# Patient Record
Sex: Female | Born: 1980
Health system: Southern US, Community
[De-identification: ages and names within clinical notes are randomized; demographics above are authoritative.]

## PROBLEM LIST (undated history)

## (undated) DIAGNOSIS — Z974 Presence of external hearing-aid: Secondary | ICD-10-CM

## (undated) DIAGNOSIS — E079 Disorder of thyroid, unspecified: Secondary | ICD-10-CM

## (undated) DIAGNOSIS — Z9079 Acquired absence of other genital organ(s): Secondary | ICD-10-CM

## (undated) DIAGNOSIS — J342 Deviated nasal septum: Secondary | ICD-10-CM

## (undated) DIAGNOSIS — F419 Anxiety disorder, unspecified: Secondary | ICD-10-CM

## (undated) DIAGNOSIS — N200 Calculus of kidney: Secondary | ICD-10-CM

## (undated) DIAGNOSIS — I639 Cerebral infarction, unspecified: Secondary | ICD-10-CM

## (undated) DIAGNOSIS — E785 Hyperlipidemia, unspecified: Secondary | ICD-10-CM

## (undated) DIAGNOSIS — K219 Gastro-esophageal reflux disease without esophagitis: Secondary | ICD-10-CM

## (undated) DIAGNOSIS — G4730 Sleep apnea, unspecified: Secondary | ICD-10-CM

## (undated) DIAGNOSIS — I10 Essential (primary) hypertension: Secondary | ICD-10-CM

## (undated) HISTORY — PX: TONSILLECTOMY: SUR1361

## (undated) HISTORY — DX: Disorder of thyroid, unspecified: E07.9

## (undated) HISTORY — DX: Cerebral infarction, unspecified: I63.9

## (undated) HISTORY — DX: Anxiety disorder, unspecified: F41.9

## (undated) HISTORY — PX: TYMPANOSTOMY: SHX2586

## (undated) HISTORY — DX: Gastro-esophageal reflux disease without esophagitis: K21.9

## (undated) HISTORY — PX: NASAL SEPTUM SURGERY: SHX37

## (undated) HISTORY — DX: Presence of external hearing-aid: Z97.4

## (undated) HISTORY — DX: Hyperlipidemia, unspecified: E78.5

## (undated) HISTORY — DX: Sleep apnea, unspecified: G47.30

---
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
## 2011-12-01 DIAGNOSIS — I10 Essential (primary) hypertension: Secondary | ICD-10-CM

## 2013-10-24 HISTORY — PX: ECTOPIC PREGNANCY SURGERY: SHX613

## 2013-11-22 ENCOUNTER — Emergency Department (HOSPITAL_COMMUNITY)
Admission: EM | Admit: 2013-11-22 | Discharge: 2013-11-23 | Disposition: A | Discharge status: Home / Self Care | Payer: Medicaid Other | Attending: Emergency Medicine | Admitting: Emergency Medicine

## 2013-11-22 ENCOUNTER — Encounter (HOSPITAL_COMMUNITY): Payer: Self-pay | Admitting: Emergency Medicine

## 2013-11-22 DIAGNOSIS — H53149 Visual discomfort, unspecified: Secondary | ICD-10-CM | POA: Insufficient documentation

## 2013-11-22 DIAGNOSIS — R51 Headache: Secondary | ICD-10-CM | POA: Insufficient documentation

## 2013-11-22 DIAGNOSIS — Z3202 Encounter for pregnancy test, result negative: Secondary | ICD-10-CM | POA: Insufficient documentation

## 2013-11-22 DIAGNOSIS — R519 Headache, unspecified: Secondary | ICD-10-CM

## 2013-11-22 DIAGNOSIS — I10 Essential (primary) hypertension: Secondary | ICD-10-CM | POA: Insufficient documentation

## 2013-11-22 DIAGNOSIS — F17200 Nicotine dependence, unspecified, uncomplicated: Secondary | ICD-10-CM | POA: Insufficient documentation

## 2013-11-22 HISTORY — DX: Essential (primary) hypertension: I10

## 2013-11-22 NOTE — ED Notes (Signed)
Pt here with severe HA which began yesterday and is in frontal and right temporal area.  Pt reports sensitivity to light and sound.  Pt is alert and oriented.  No fever, neck supple.  Pt has taken tylenol and advil without relief.  Pt has not taken BP meds today.  Hypertensive on arrival.

## 2013-11-22 NOTE — ED Provider Notes (Signed)
CSN: [id number]     Arrival date & time 11/22/13  2308 History  This chart was scribed for Maria Clonts, MD by Elby Beck, ED Scribe. This patient was seen in room APA10/APA10 and the patient's care was started at 11:35 PM.    Chief Complaint  Patient presents with  . Migraine     The history is provided by the patient. No language interpreter was used.    HPI Comments: Maria Lang is a 33 y.o. Female with a history of HTN who presents to the Emergency Department complaining of a gradual onset, gradually worsening constant, moderate headache onset yesterday. She locates this headache to her right temple and frontal head. She denies any head injury. She reports associated photophobia. She states that she has tried Tylenol and Advil without relief. She states that she has had similar, but less severe headaches in the past. She denies any history of aneurysm or brain disease. She denies any associated fever, neck stiffness, emesis, neck pain, chest pain, SOB, abdominal pain, diarrhea, weakness, numbness, bowel or bladder incontinence. She states that she has no medication allergies.    Past Medical History  Diagnosis Date  . Hypertension    Past Surgical History  Procedure Laterality Date  . Ectopic pregnancy surgery  10/2013   No family history on file. History  Substance Use Topics  . Smoking status: Current Every Day Smoker -- 0.50 packs/day    Types: Cigarettes  . Smokeless tobacco: Not on file  . Alcohol Use: No   OB History   Grav Para Term Preterm Abortions TAB SAB Ect Mult Living                 Review of Systems  Constitutional: Negative for fever.  Eyes: Positive for photophobia.  Respiratory: Negative for shortness of breath.   Cardiovascular: Negative for chest pain.  Gastrointestinal: Negative for vomiting, abdominal pain and diarrhea.       Denies bowel incontinence  Genitourinary:       Denies bladder incontinence  Musculoskeletal: Negative for neck  pain and neck stiffness.  Neurological: Positive for headaches. Negative for weakness and numbness.  All other systems reviewed and are negative.   Allergies  Review of patient's allergies indicates no known allergies.  Home Medications   Prior to Admission medications   Not on File   Triage Vitals: BP 192/115  Pulse 70  Temp(Src) 98.1 F (36.7 C) (Oral)  Resp 20  SpO2 100%  LMP 10/08/2013  Physical Exam  Nursing note and vitals reviewed. Constitutional: She is oriented to person, place, and time. She appears well-developed and well-nourished. No distress.  Well-appearing  HENT:  Head: Normocephalic and atraumatic.  Eyes: EOM are normal. Pupils are equal, round, and reactive to light.  Neck: Neck supple. No tracheal deviation present.  No meningismus  Cardiovascular: Normal rate.   Pulmonary/Chest: Effort normal. No respiratory distress.  Musculoskeletal: Normal range of motion.  Neurological: She is alert and oriented to person, place, and time.  Cranial nerves intact. Visual fields intact. No arm drift noted. Finger to nose intact.  Skin: Skin is warm and dry.  Psychiatric: She has a normal mood and affect. Her behavior is normal.    ED Course  Procedures (including critical care time)  DIAGNOSTIC STUDIES: Oxygen Saturation is 100% on RA, normal by my interpretation.    COORDINATION OF CARE: 11:40 PM- Pt advised of plan for treatment and pt agrees.  Labs Review Labs Reviewed - No  data to display  Imaging Review No results found.   EKG Interpretation None      MDM   Final diagnoses:  Headache    I personally performed the services described in this documentation, which was scribed in my presence. The recorded information has been reviewed and is accurate.  Pt with gradual onset HA overall similar to previous, no trauma, no signs of meningitis, normal neuro exam. I do not suspect emergent HA at this time. HA sxs resolved in ED and pt asking to be  dc to fup oupt.  Discussed close fup for htn and to take medicines regularly, no signs or sxs of end organ damage, pt did not take meds today, asked her to take them.  Results and differential diagnosis were discussed with the patient/parent/guardian. Close follow up outpatient was discussed, comfortable with the plan.   Filed Vitals:   11/22/13 2350 11/23/13 0016 11/23/13 0103 11/23/13 0200  BP: 177/113 173/114 146/91 120/74  Pulse: 66 68 80 74  Temp:    98.2 F (36.8 C)  TempSrc:    Oral  Resp: 20  20 20   SpO2: 99% 100% 98% 97%      Maria Clonts, MD 11/24/13 2214

## 2013-11-22 NOTE — ED Notes (Signed)
Pt given ice water to take her labetolol which she didn't take this am.  Await orders for further medications

## 2013-11-23 LAB — POC URINE PREG, ED: Preg Test, Ur: NEGATIVE

## 2013-11-23 MED ORDER — PROCHLORPERAZINE EDISYLATE 5 MG/ML IJ SOLN
10.0000 mg | Freq: Once | INTRAMUSCULAR | Status: AC
  Administered 2013-11-23: 10 mg via INTRAVENOUS
  Filled 2013-11-23: qty 2

## 2013-11-23 MED ORDER — KETOROLAC TROMETHAMINE 60 MG/2ML IM SOLN
30.0000 mg | Freq: Once | INTRAMUSCULAR | Status: DC
  Filled 2013-11-23: qty 2

## 2013-11-23 MED ORDER — SODIUM CHLORIDE 0.9 % IV BOLUS (SEPSIS)
1000.0000 mL | Freq: Once | INTRAVENOUS | Status: AC
  Administered 2013-11-23: 1000 mL via INTRAVENOUS

## 2013-11-23 MED ORDER — DIPHENHYDRAMINE HCL 50 MG/ML IJ SOLN
50.0000 mg | Freq: Once | INTRAMUSCULAR | Status: AC
  Administered 2013-11-23: 50 mg via INTRAVENOUS
  Filled 2013-11-23: qty 1

## 2013-11-23 MED ORDER — KETOROLAC TROMETHAMINE 60 MG/2ML IM SOLN
15.0000 mg | Freq: Once | INTRAMUSCULAR | Status: AC
  Administered 2013-11-23: 15 mg via INTRAMUSCULAR

## 2013-11-23 NOTE — Discharge Instructions (Signed)
If you were given medicines take as directed.  If you are on coumadin or contraceptives realize their levels and effectiveness is altered by many different medicines.  If you have any reaction (rash, tongues swelling, other) to the medicines stop taking and see a physician.   Please follow up as directed and return to the ER or see a physician for new or worsening symptoms or headache that is different than normal or accompanied by fevers and neck stiffness/confusion or other concerns..  Thank you. Followup with your primary Dr. for blood pressure recheck and possible medicine adjustments. Take your blood pressure meds as directed. Filed Vitals:   11/22/13 2318 11/22/13 2350 11/23/13 0016 11/23/13 0103  BP: 192/115 177/113 173/114 146/91  Pulse: 70 66 68 80  Temp: 98.1 F (36.7 C)     TempSrc: Oral     Resp: 20 20  20   SpO2: 100% 99% 100% 98%

## 2014-08-30 ENCOUNTER — Emergency Department (HOSPITAL_COMMUNITY): Payer: Medicaid Other

## 2014-08-30 ENCOUNTER — Encounter (HOSPITAL_COMMUNITY): Payer: Self-pay

## 2014-08-30 ENCOUNTER — Emergency Department (HOSPITAL_COMMUNITY)
Admission: EM | Admit: 2014-08-30 | Discharge: 2014-08-30 | Disposition: A | Discharge status: Home / Self Care | Payer: Medicaid Other | Attending: Emergency Medicine | Admitting: Emergency Medicine

## 2014-08-30 DIAGNOSIS — I10 Essential (primary) hypertension: Secondary | ICD-10-CM | POA: Diagnosis not present

## 2014-08-30 DIAGNOSIS — Z87891 Personal history of nicotine dependence: Secondary | ICD-10-CM | POA: Diagnosis not present

## 2014-08-30 DIAGNOSIS — R51 Headache: Secondary | ICD-10-CM | POA: Insufficient documentation

## 2014-08-30 DIAGNOSIS — R079 Chest pain, unspecified: Secondary | ICD-10-CM | POA: Diagnosis present

## 2014-08-30 LAB — BASIC METABOLIC PANEL
ANION GAP: 9 (ref 5–15)
BUN: 19 mg/dL (ref 6–23)
CALCIUM: 9.7 mg/dL (ref 8.4–10.5)
CHLORIDE: 105 mmol/L (ref 96–112)
CO2: 25 mmol/L (ref 19–32)
Creatinine, Ser: 1.02 mg/dL (ref 0.50–1.10)
GFR, EST AFRICAN AMERICAN: 84 mL/min — AB (ref >90)
GFR, EST NON AFRICAN AMERICAN: 72 mL/min — AB (ref >90)
GLUCOSE: 120 mg/dL — AB (ref 70–99)
POTASSIUM: 3.6 mmol/L (ref 3.5–5.1)
Sodium: 139 mmol/L (ref 135–145)

## 2014-08-30 LAB — CBC
HEMATOCRIT: 37.4 % (ref 36.0–46.0)
HEMOGLOBIN: 12.2 g/dL (ref 12.0–15.0)
MCH: 27.5 pg (ref 26.0–34.0)
MCHC: 32.6 g/dL (ref 30.0–36.0)
MCV: 84.2 fL (ref 78.0–100.0)
Platelets: 303 10*3/uL (ref 150–400)
RBC: 4.44 MIL/uL (ref 3.87–5.11)
RDW: 14.1 % (ref 11.5–15.5)
WBC: 9.1 10*3/uL (ref 4.0–10.5)

## 2014-08-30 LAB — TROPONIN I: Troponin I: <0.03 ng/mL (ref <0.031)

## 2014-08-30 IMAGING — CR DG CHEST 1V PORT
1 series · 1 of 1 positions shown · non-contrast
Comparison: None.

CLINICAL DATA: Generalized chest pain for 2 days. Headache for 1
day. Hypertension.

EXAM:
PORTABLE CHEST - 1 VIEW

[portable]
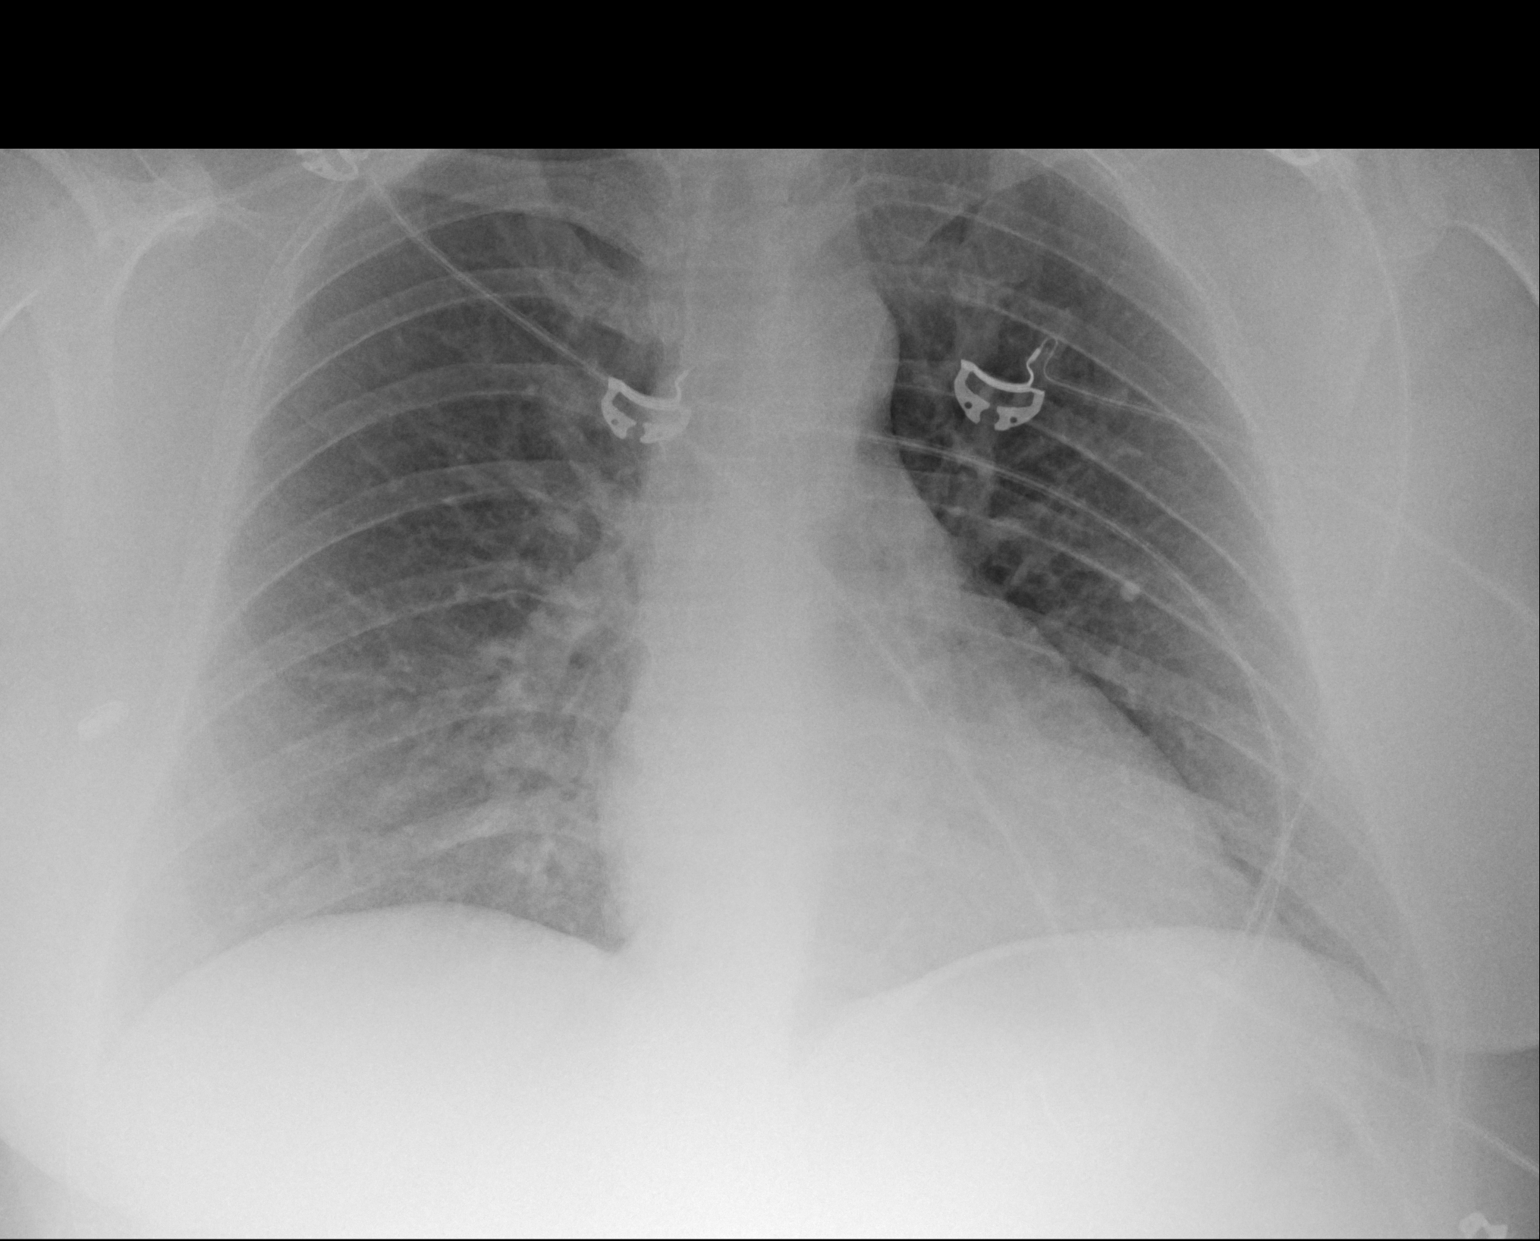

[1 of 1 positions shown; findings below may reference images not displayed]

FINDINGS: The heart size and mediastinal contours are within normal limits.
Both lungs are clear. The visualized skeletal structures are
unremarkable.
IMPRESSION: No active disease.

## 2014-08-30 IMAGING — CT CT HEAD W/O CM
1 series · 16 of 30 positions shown, 20 images · non-contrast
Comparison: None.

CLINICAL DATA: Headaches for 1 day and chest pain for 2 days. No
trauma.

EXAM:
CT HEAD WITHOUT CONTRAST
TECHNIQUE: Contiguous axial images were obtained from the base of the skull
through the vertex without intravenous contrast.

[Series 2: headseq 4.8 h37s · axial · 0.44mm/px · z∈[+91,+249]mm · 16 of 36 slices shown, 20 images]
[im 2/36  brain]
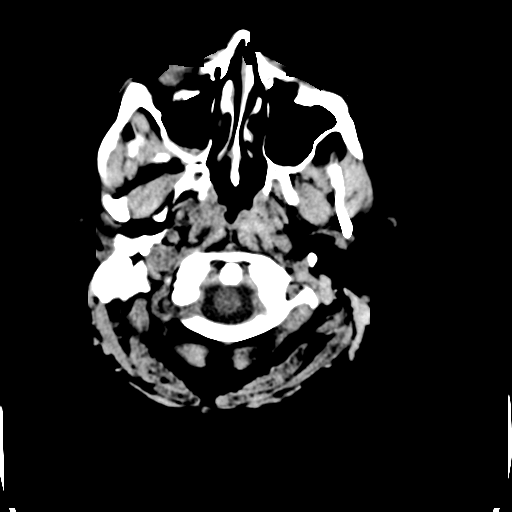
[im 2/36  bone]
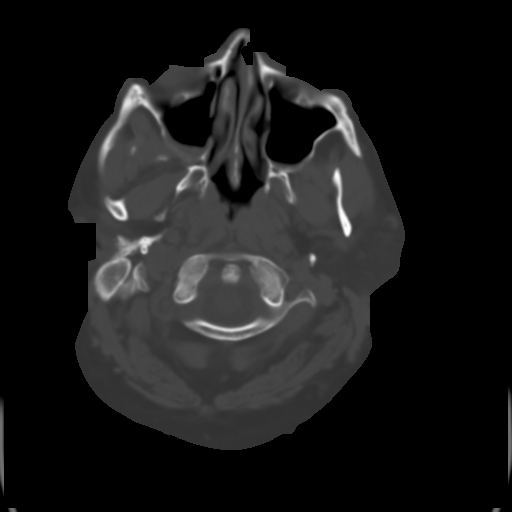
[im 4/36  brain]
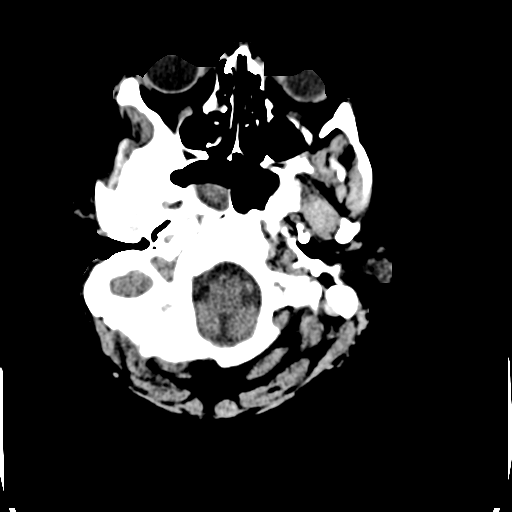
[im 7/36  brain]
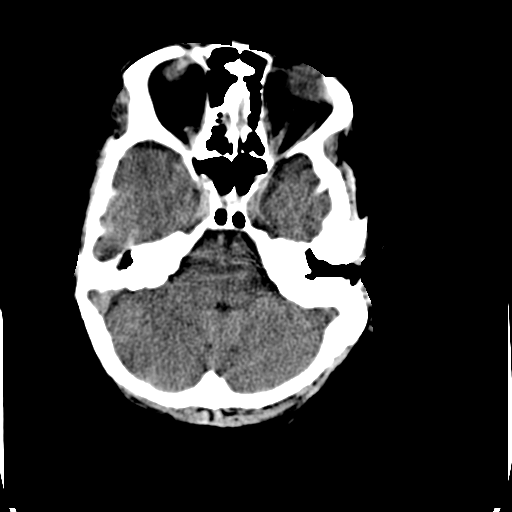
[im 9/36  brain]
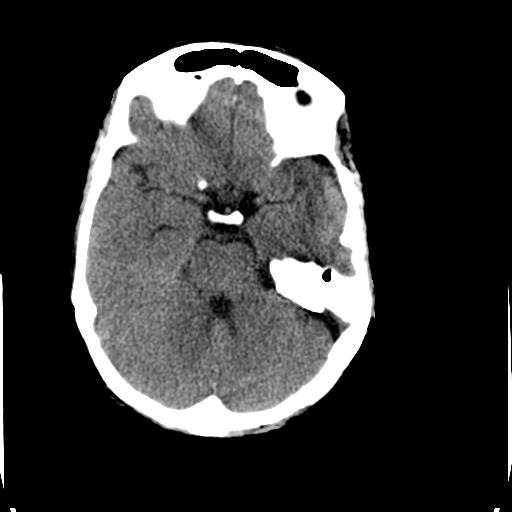
[im 10/36  brain]
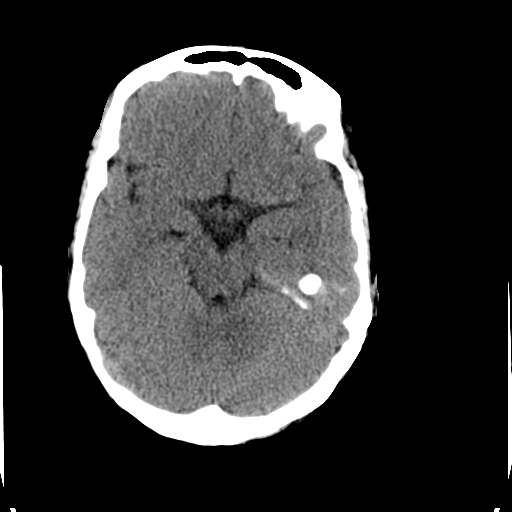
[im 10/36  bone]
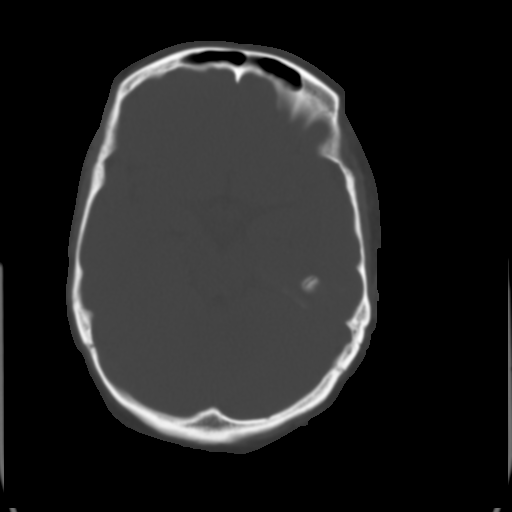
[im 13/36  brain]
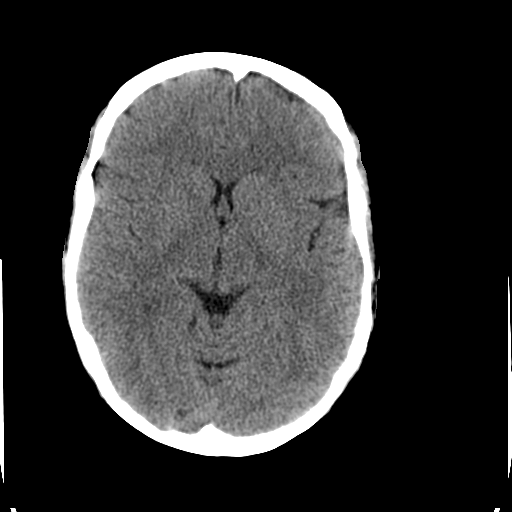
[im 15/36  brain]
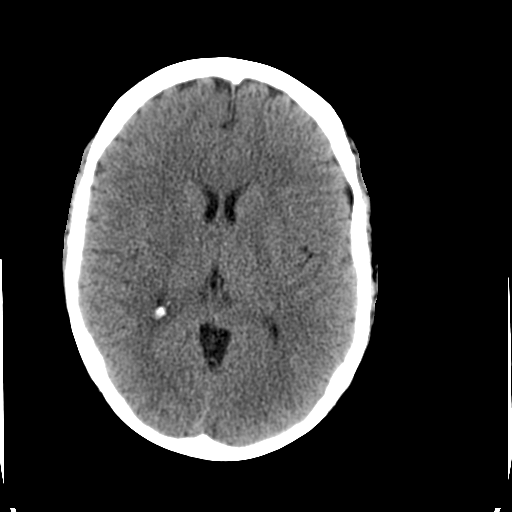
[im 17/36  brain]
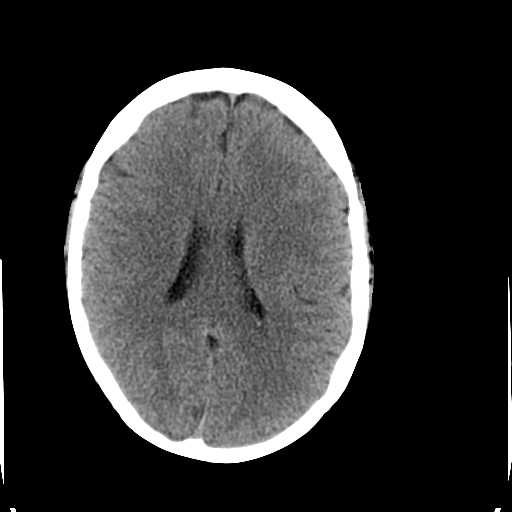
[im 19/36  brain]
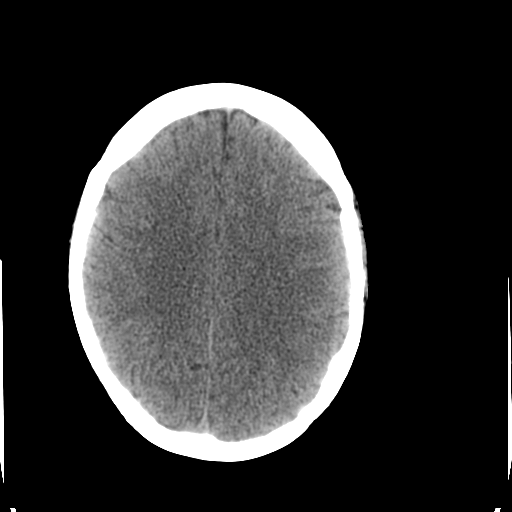
[im 19/36  bone]
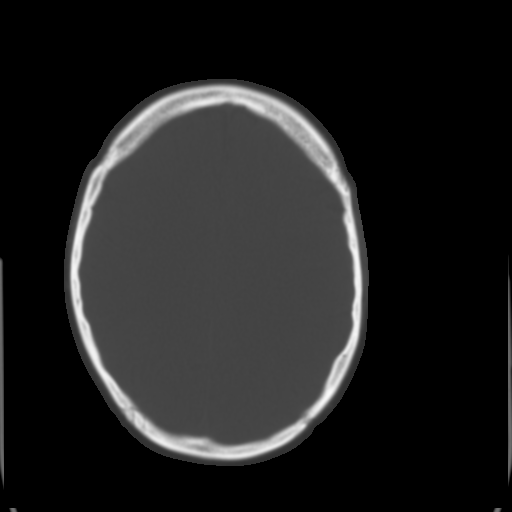
[im 21/36  brain]
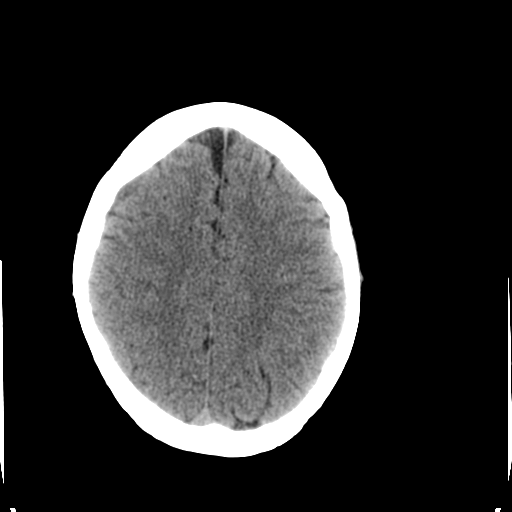
[im 23/36  brain]
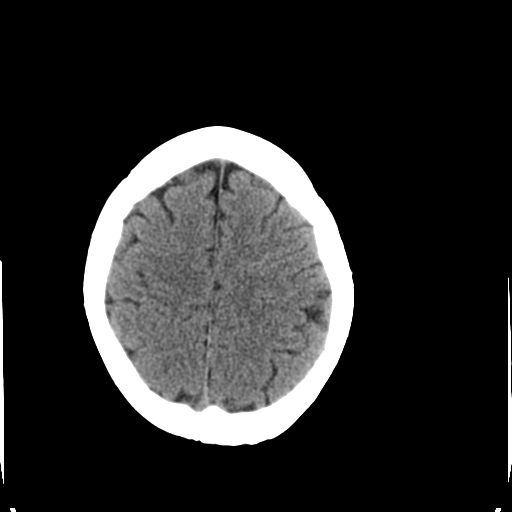
[im 26/36  brain]
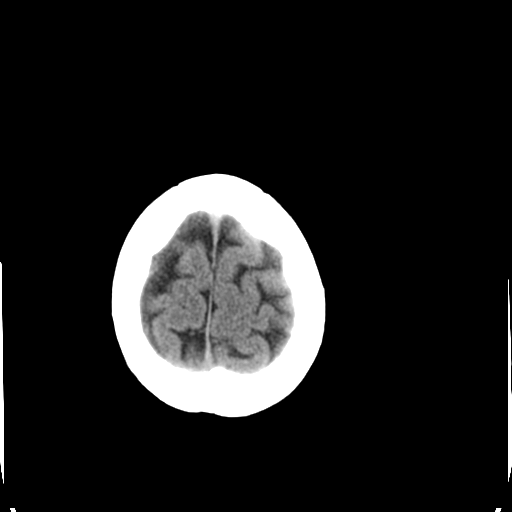
[im 27/36  brain]
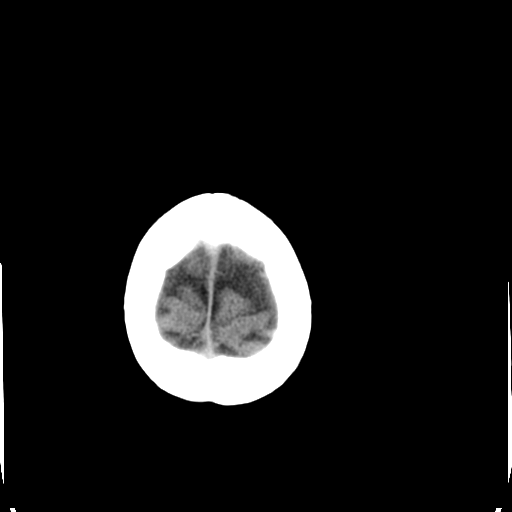
[im 27/36  bone]
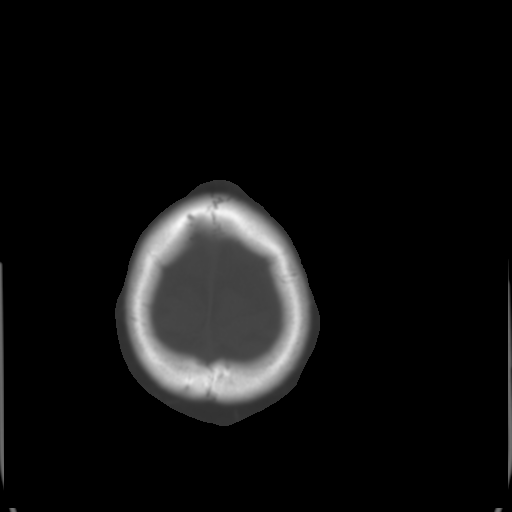
[im 29/36  brain]
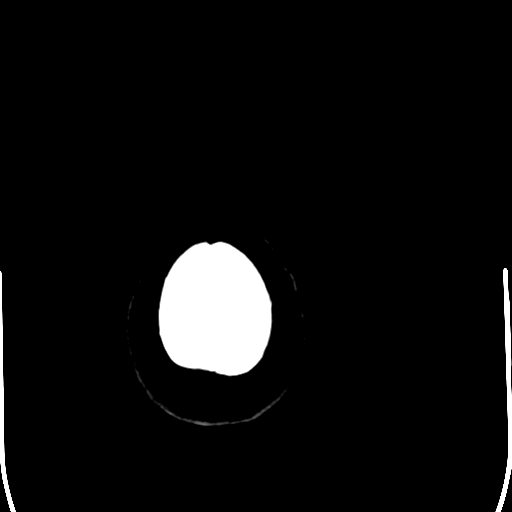
[im 32/36  brain]
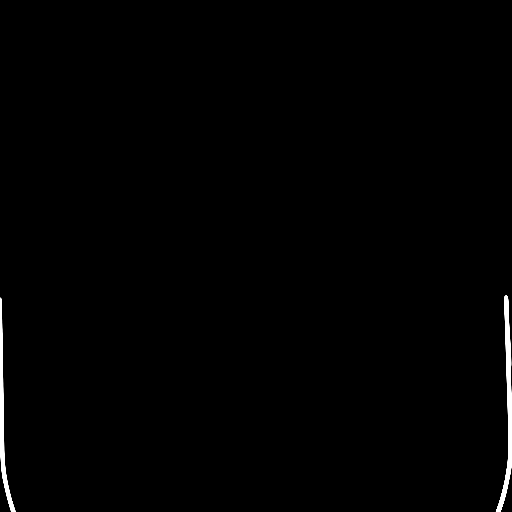
[im 34/36  brain]
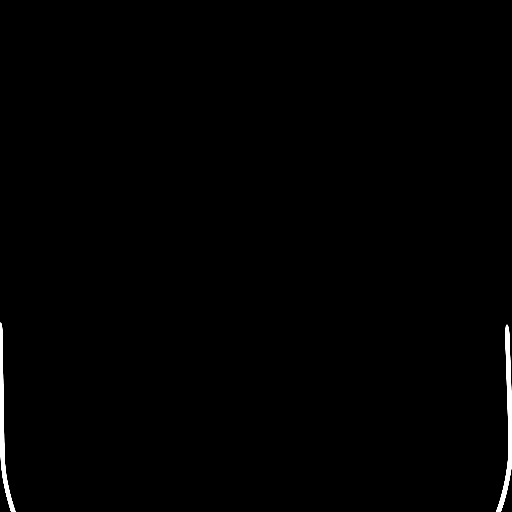

[16 of 30 positions shown; findings below may reference images not displayed]

FINDINGS: Ventricles and sulci appear symmetrical. No mass effect or midline
shift. No abnormal extra-axial fluid collections. Gray-white matter
junctions are distinct. Basal cisterns are not effaced. No evidence
of acute intracranial hemorrhage. No depressed skull fractures.
Retention cysts in the sphenoid sinus and right maxillary antrum.
Mild mucosal thickening in the paranasal sinuses. Mastoid air cells
are hypo aerated.
IMPRESSION: No acute intracranial abnormalities. Chronic inflammatory changes in
the paranasal sinuses.

## 2014-08-30 MED ORDER — LORAZEPAM 2 MG/ML IJ SOLN
1.0000 mg | Freq: Once | INTRAMUSCULAR | Status: AC
Start: 2014-08-30 — End: 2014-08-30
  Administered 2014-08-30: 1 mg via INTRAVENOUS
  Filled 2014-08-30: qty 1

## 2014-08-30 MED ORDER — OXYCODONE-ACETAMINOPHEN 5-325 MG PO TABS: 1.0000 | ORAL_TABLET | Freq: Four times a day (QID) | ORAL | Status: DC | PRN

## 2014-08-30 MED ORDER — ASPIRIN 325 MG PO TABS
325.0000 mg | ORAL_TABLET | ORAL | Status: AC
  Administered 2014-08-30: 325 mg via ORAL
  Filled 2014-08-30: qty 1

## 2014-08-30 MED ORDER — LABETALOL HCL 5 MG/ML IV SOLN
20.0000 mg | Freq: Once | INTRAVENOUS | Status: AC
  Administered 2014-08-30: 20 mg via INTRAVENOUS
  Filled 2014-08-30: qty 4

## 2014-08-30 MED ORDER — HYDROCODONE-ACETAMINOPHEN 5-325 MG PO TABS
1.0000 | ORAL_TABLET | Freq: Once | ORAL | Status: AC
  Administered 2014-08-30: 1 via ORAL
  Filled 2014-08-30: qty 1

## 2014-08-30 NOTE — Discharge Instructions (Signed)
Follow up with your md this week,   Increase norvasc to 10 mg a day

## 2014-08-30 NOTE — ED Notes (Signed)
Friday night my chest started hurting, yesterday my blood pressure was high, last night I had a terrible headache. Blood pressure has consistently been high and chest pain has been coming and going but has not been as bad as it was on Friday.

## 2014-08-30 NOTE — ED Provider Notes (Signed)
CSN: [id number]     Arrival date & time 08/30/14  1919 History  This chart was scribed for Maudry Diego, MD by Edison Simon, ED Scribe. This patient was seen in room APA04/APA04 and the patient's care was started at 7:32 PM.    Chief Complaint  Patient presents with  . Chest Pain   Patient is a 34 y.o. female presenting with chest pain. The history is provided by the patient. No language interpreter was used.  Chest Pain Pain radiates to:  Does not radiate Pain radiates to the back: no   Pain severity:  Moderate Onset quality:  Sudden Duration:  2 days Timing:  Intermittent Progression:  Improving Chronicity:  New (but similar to GERD pain) Context: at rest   Relieved by:  None tried Worsened by:  Nothing tried Ineffective treatments:  None tried Associated symptoms: headache   Associated symptoms: no abdominal pain, no back pain, no cough and no fatigue   Risk factors: hypertension     HPI Comments: Maria Lang is a 34 y.o. female with history of HTN who presents to the Emergency Department complaining of intermittent chest pain with onset 2 days ago. She notes she has GERD and was unsure if it was that. She took blood pressure yesterday and found it to be 204/124; it continued to be high today. She also reports headache with onset last night. She has history of HTN and uses medication for it.   Past Medical History  Diagnosis Date  . Hypertension    Past Surgical History  Procedure Laterality Date  . Ectopic pregnancy surgery  10/2013   No family history on file. History  Substance Use Topics  . Smoking status: Former Smoker -- 0.50 packs/day    Types: Cigarettes    Quit date: 06/10/2014  . Smokeless tobacco: Not on file  . Alcohol Use: No   OB History    No data available     Review of Systems  Constitutional: Negative for appetite change and fatigue.  HENT: Negative for congestion, ear discharge and sinus pressure.   Eyes: Negative for discharge.   Respiratory: Negative for cough.   Cardiovascular: Positive for chest pain.  Gastrointestinal: Negative for abdominal pain and diarrhea.  Genitourinary: Negative for frequency and hematuria.  Musculoskeletal: Negative for back pain.  Skin: Negative for rash.  Neurological: Positive for headaches. Negative for seizures.  Psychiatric/Behavioral: Negative for hallucinations.      Allergies  Review of patient's allergies indicates no known allergies.  Home Medications   Prior to Admission medications   Not on File   BP 203/136 mmHg  Pulse 88  Temp(Src) 98.4 F (36.9 C) (Oral)  Resp 18  Ht 5\' 6"  (1.676 m)  Wt 175 lb (79.379 kg)  BMI 28.26 kg/m2  SpO2 100%  LMP 08/16/2014 (Approximate) Physical Exam  Constitutional: She is oriented to person, place, and time. She appears well-developed.  HENT:  Head: Normocephalic.  Eyes: Conjunctivae and EOM are normal. No scleral icterus.  Neck: Neck supple. No thyromegaly present.  Cardiovascular: Normal rate and regular rhythm.  Exam reveals no gallop and no friction rub.   No murmur heard. Pulmonary/Chest: No stridor. She has no wheezes. She has no rales. She exhibits no tenderness.  Abdominal: She exhibits no distension. There is no tenderness. There is no rebound.  Musculoskeletal: Normal range of motion. She exhibits no edema.  Lymphadenopathy:    She has no cervical adenopathy.  Neurological: She is oriented to person, place,  and time. She exhibits normal muscle tone. Coordination normal.  Skin: No rash noted. No erythema.  Psychiatric: She has a normal mood and affect. Her behavior is normal.  Nursing note and vitals reviewed.   ED Course  Procedures (including critical care time)  DIAGNOSTIC STUDIES: Oxygen Saturation is 100% on room air, normal by my interpretation.    COORDINATION OF CARE: 7:36 PM Discussed treatment plan with patient at beside, the patient agrees with the plan and has no further questions at this  time.   Labs Review Labs Reviewed - No data to display  Imaging Review No results found.   EKG Interpretation   Date/Time:  Sunday August 30 2014 19:27:46 EST Ventricular Rate:  86 PR Interval:  153 QRS Duration: 90 QT Interval:  393 QTC Calculation: 470 R Axis:   74 Text Interpretation:  Sinus rhythm Probable left atrial enlargement  Confirmed by Sheril Hammond  MD, Oriyah Lamphear [phone]) on 08/30/2014 10:09:20 PM      MDM   Final diagnoses:  None    Poorly controled htn,   Will increase norvasc  The chart was scribed for me under my direct supervision.  I personally performed the history, physical, and medical decision making and all procedures in the evaluation of this patient.Maudry Diego, MD 08/30/14 2212

## 2014-09-09 MED FILL — Oxycodone w/ Acetaminophen Tab 5-325 MG: ORAL | Qty: 6 | Status: AC

## 2014-10-08 ENCOUNTER — Emergency Department (HOSPITAL_COMMUNITY)
Admission: EM | Admit: 2014-10-08 | Discharge: 2014-10-08 | Disposition: A | Discharge status: Home / Self Care | Payer: Medicaid Other | Attending: Emergency Medicine | Admitting: Emergency Medicine

## 2014-10-08 ENCOUNTER — Emergency Department (HOSPITAL_COMMUNITY): Payer: Medicaid Other

## 2014-10-08 ENCOUNTER — Encounter (HOSPITAL_COMMUNITY): Payer: Self-pay | Admitting: Emergency Medicine

## 2014-10-08 DIAGNOSIS — Y9289 Other specified places as the place of occurrence of the external cause: Secondary | ICD-10-CM | POA: Insufficient documentation

## 2014-10-08 DIAGNOSIS — Z87891 Personal history of nicotine dependence: Secondary | ICD-10-CM | POA: Diagnosis not present

## 2014-10-08 DIAGNOSIS — I10 Essential (primary) hypertension: Secondary | ICD-10-CM | POA: Diagnosis not present

## 2014-10-08 DIAGNOSIS — Y998 Other external cause status: Secondary | ICD-10-CM | POA: Diagnosis not present

## 2014-10-08 DIAGNOSIS — S8392XA Sprain of unspecified site of left knee, initial encounter: Secondary | ICD-10-CM

## 2014-10-08 DIAGNOSIS — Z79899 Other long term (current) drug therapy: Secondary | ICD-10-CM | POA: Diagnosis not present

## 2014-10-08 DIAGNOSIS — X58XXXA Exposure to other specified factors, initial encounter: Secondary | ICD-10-CM | POA: Diagnosis not present

## 2014-10-08 DIAGNOSIS — S8992XA Unspecified injury of left lower leg, initial encounter: Secondary | ICD-10-CM | POA: Diagnosis present

## 2014-10-08 DIAGNOSIS — Y9389 Activity, other specified: Secondary | ICD-10-CM | POA: Diagnosis not present

## 2014-10-08 IMAGING — DX DG KNEE COMPLETE 4+V*L*
4 series · 4 of 4 positions shown · non-contrast
Comparison: None.

CLINICAL DATA: Pain following new exercise routine. Question
twisting injury

EXAM:
LEFT KNEE - COMPLETE 4+ VIEW

[knee ap]
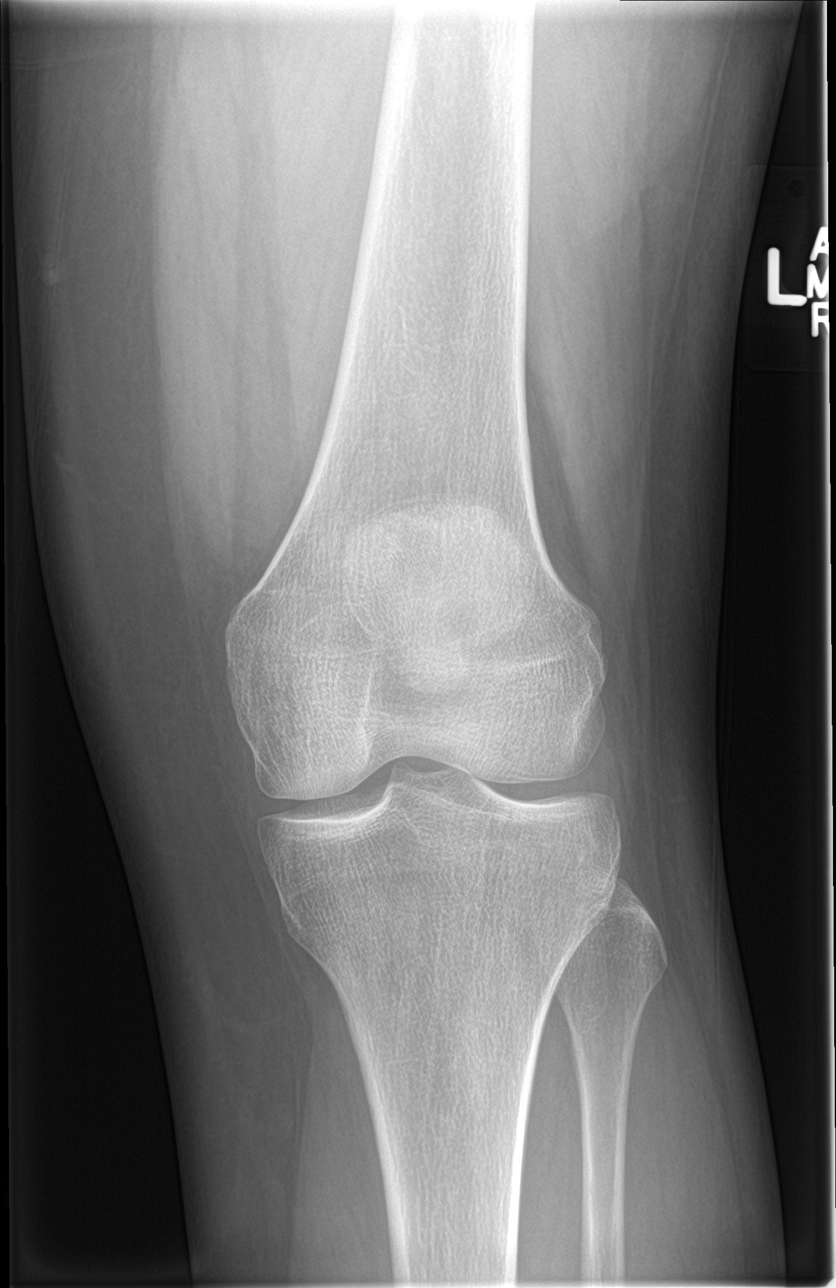

[knee obl (1 of 2)]
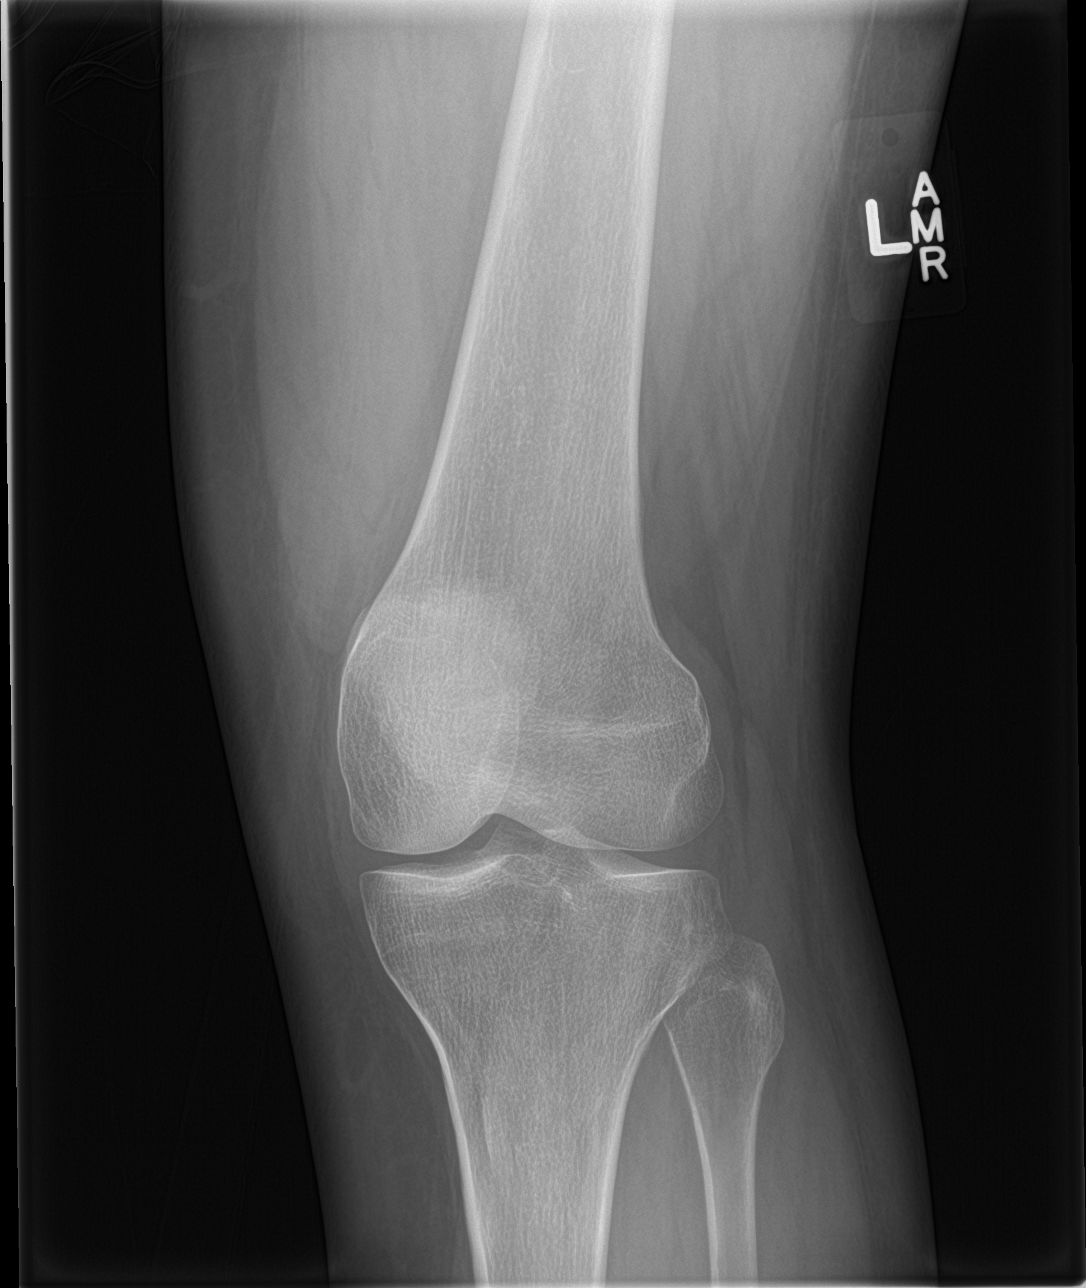

[knee obl (2 of 2)]
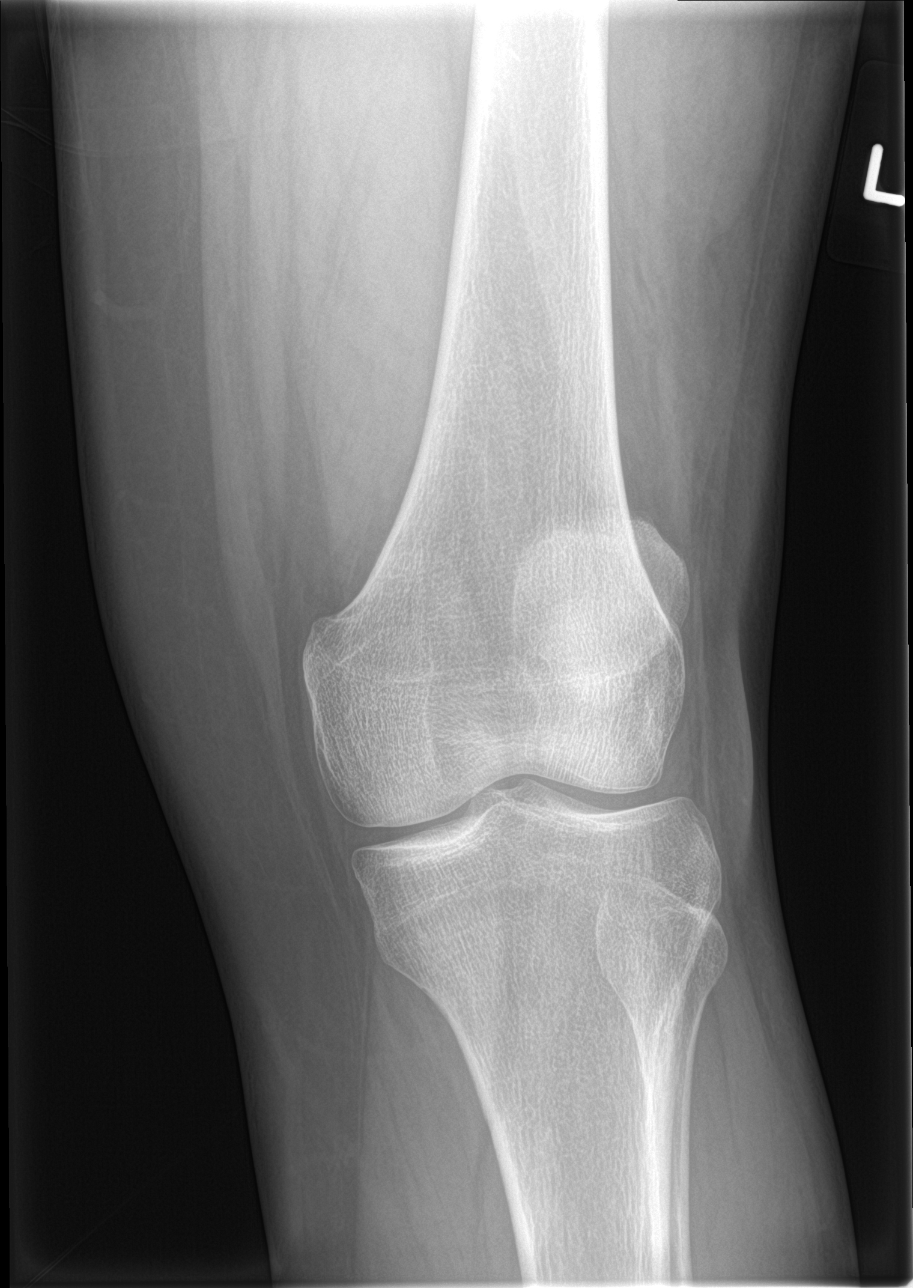

[knee lat]
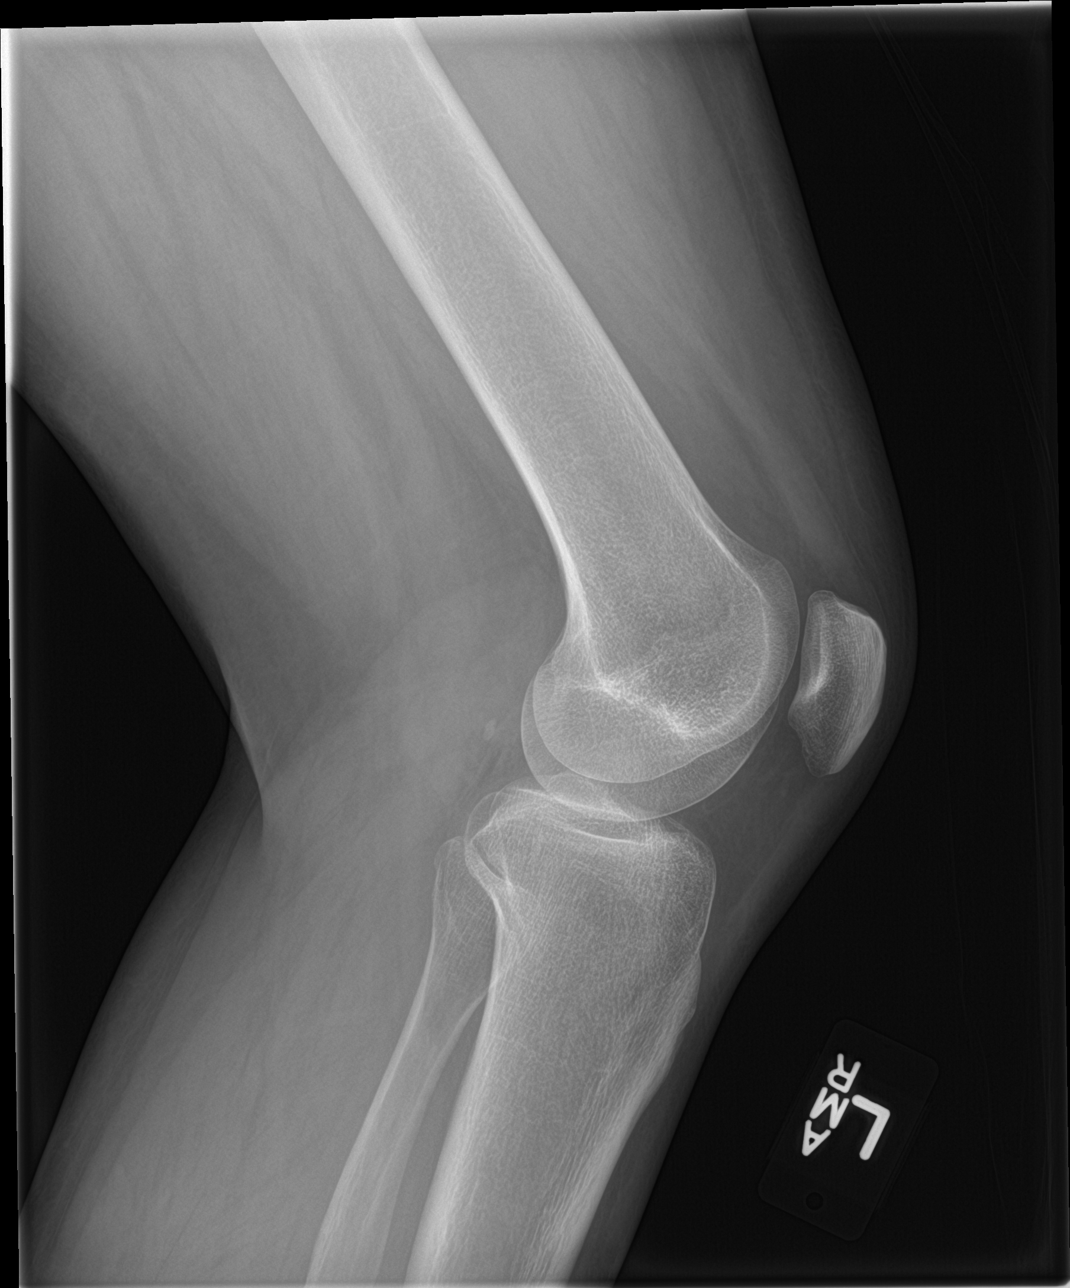

[4 of 4 positions shown; findings below may reference images not displayed]

FINDINGS: Frontal, bilateral oblique, and lateral views obtained. There is no
apparent fracture or joint effusion. No dislocation. Joint spaces
appear intact. No erosive change.
IMPRESSION: No fracture or joint effusion.  No appreciable arthropathic change.

## 2014-10-08 MED ORDER — NAPROXEN 500 MG PO TABS: 500.0000 mg | ORAL_TABLET | Freq: Two times a day (BID) | ORAL | Status: DC

## 2014-10-08 MED ORDER — HYDROCODONE-ACETAMINOPHEN 5-325 MG PO TABS: ORAL_TABLET | ORAL | Status: DC

## 2014-10-08 NOTE — ED Notes (Signed)
Pt reports L knee pain since Sat, thinks she may have had an injury during a Zumba class.

## 2014-10-08 NOTE — Discharge Instructions (Signed)
Knee Pain Knee pain can be a result of an injury or other medical conditions. Treatment will depend on the cause of your pain. HOME CARE  Only take medicine as told by your doctor.  Keep a healthy weight. Being overweight can make the knee hurt more.  Stretch before exercising or playing sports.  If there is constant knee pain, change the way you exercise. Ask your doctor for advice.  Make sure shoes fit well. Choose the right shoe for the sport or activity.  Protect your knees. Wear kneepads if needed.  Rest when you are tired. GET HELP RIGHT AWAY IF:   Your knee pain does not stop.  Your knee pain does not get better.  Your knee joint feels hot to the touch.  You have a fever. MAKE SURE YOU:   Understand these instructions.  Will watch this condition.  Will get help right away if you are not doing well or get worse. Document Released: 09/08/2008 Document Revised: 09/04/2011 Document Reviewed: 09/08/2008 [facility] Patient Information 2015 [place], [place]. This information is not intended to replace advice given to you by your health care provider. Make sure you discuss any questions you have with your health care provider.

## 2014-10-08 NOTE — ED Notes (Signed)
Diastolic pressure 356, incorrect entry on 12.

## 2014-10-11 NOTE — ED Provider Notes (Signed)
CSN: [id number]     Arrival date & time 10/08/14  1006 History   First MD Initiated Contact with Patient 10/08/14 1155     Chief Complaint  Patient presents with  . Knee Pain     (Consider location/radiation/quality/duration/timing/severity/associated sxs/prior Treatment) HPI   Maria Lang is a 34 y.o. female who presents to the Emergency Department complaining of left knee pain for 5 days.  She states that she my have twisted her knee during an exercise class.  Pt is worse with weight bearing and bending.  She has not tried any therapies for symptomatic relief.  She denies numbness or weakness of the lower extremities, swelling or discoloration.     Past Medical History  Diagnosis Date  . Hypertension    Past Surgical History  Procedure Laterality Date  . Ectopic pregnancy surgery  10/2013   Family History  Problem Relation Age of Onset  . Hypertension Mother   . Hypertension Father   . Hyperlipidemia Father   . Diabetes Brother   . Diabetes Other    History  Substance Use Topics  . Smoking status: Former Smoker -- 0.50 packs/day    Types: Cigarettes    Quit date: 06/10/2014  . Smokeless tobacco: Never Used  . Alcohol Use: No   OB History    Gravida Para Term Preterm AB TAB SAB Ectopic Multiple Living   4 3 3  1   1        Review of Systems  Constitutional: Negative for fever and chills.  Musculoskeletal: Positive for arthralgias (left knee). Negative for joint swelling.  Skin: Negative for color change and wound.  All other systems reviewed and are negative.     Allergies  Review of patient's allergies indicates no known allergies.  Home Medications   Prior to Admission medications   Medication Sig Start Date End Date Taking? Authorizing Provider  amLODipine (NORVASC) 5 MG tablet Take 5 mg by mouth daily.    Historical Provider, MD  carvedilol (COREG) 12.5 MG tablet Take 12.5 mg by mouth daily.    Historical Provider, MD  clonazePAM (KLONOPIN) 1 MG  tablet Take 1 mg by mouth 3 (three) times daily as needed for anxiety.    Historical Provider, MD  HYDROcodone-acetaminophen (NORCO/VICODIN) 5-325 MG per tablet Take one-two tabs po q 4-6 hrs prn pain 10/08/14   Levina Boyack, PA-C  naproxen (NAPROSYN) 500 MG tablet Take 1 tablet (500 mg total) by mouth 2 (two) times daily. 10/08/14   Jany Buckwalter, PA-C  oxyCODONE-acetaminophen (PERCOCET/ROXICET) 5-325 MG per tablet Take 1 tablet by mouth every 6 (six) hours as needed. 08/30/14   Milton Ferguson, MD   BP 160/99 mmHg  Pulse 65  Temp(Src) 98.1 F (36.7 C) (Oral)  Resp 14  Ht 5\' 7"  (1.702 m)  Wt 189 lb (85.73 kg)  BMI 29.59 kg/m2  SpO2 100%  LMP 09/24/2014 Physical Exam  Constitutional: She is oriented to person, place, and time. She appears well-developed and well-nourished. No distress.  Cardiovascular: Normal rate, regular rhythm, normal heart sounds and intact distal pulses.   Pulmonary/Chest: Effort normal and breath sounds normal. No respiratory distress.  Musculoskeletal: She exhibits tenderness.  Diffuse ttp of the left knee.  No erythema, effusion, or step-off deformity.  DP pulse brisk, distal sensation intact. Calf is soft and NT.  Neurological: She is alert and oriented to person, place, and time. She exhibits normal muscle tone. Coordination normal.  Skin: Skin is warm and dry. No erythema.  Nursing note and vitals reviewed.   ED Course  Procedures (including critical care time) Labs Review Labs Reviewed - No data to display  Imaging Review Dg Knee Complete 4 Views Left  10/08/2014   CLINICAL DATA:  Pain following new exercise routine. Question twisting injury  EXAM: LEFT KNEE - COMPLETE 4+ VIEW  COMPARISON:  None.  FINDINGS: Frontal, bilateral oblique, and lateral views obtained. There is no apparent fracture or joint effusion. No dislocation. Joint spaces appear intact. No erosive change.  IMPRESSION: No fracture or joint effusion.  No appreciable arthropathic change.    Electronically Signed   By: Lowella Grip III M.D.   On: 10/08/2014 10:45      EKG Interpretation None      MDM   Final diagnoses:  Knee sprain, left, initial encounter    Pt with left knee pain that is likely a sprain.  ACE wrap applied.  Pain improved.  Remains NV intact.  No concerning sx's for septic joint or bony injury.  Pt agrees to symptomatic tx and ortho f/u      Kem Parkinson, PA-C 10/11/14 1901  Milton Ferguson, MD 10/12/14 [phone]

## 2014-12-05 ENCOUNTER — Encounter (HOSPITAL_COMMUNITY): Payer: Self-pay | Admitting: Emergency Medicine

## 2014-12-05 ENCOUNTER — Emergency Department (HOSPITAL_COMMUNITY)
Admission: EM | Admit: 2014-12-05 | Discharge: 2014-12-05 | Disposition: A | Discharge status: Home / Self Care | Payer: Medicaid Other | Attending: Emergency Medicine | Admitting: Emergency Medicine

## 2014-12-05 ENCOUNTER — Emergency Department (HOSPITAL_COMMUNITY): Payer: Medicaid Other

## 2014-12-05 DIAGNOSIS — H5711 Ocular pain, right eye: Secondary | ICD-10-CM | POA: Insufficient documentation

## 2014-12-05 DIAGNOSIS — Z87891 Personal history of nicotine dependence: Secondary | ICD-10-CM | POA: Diagnosis not present

## 2014-12-05 DIAGNOSIS — Z79899 Other long term (current) drug therapy: Secondary | ICD-10-CM | POA: Insufficient documentation

## 2014-12-05 DIAGNOSIS — R52 Pain, unspecified: Secondary | ICD-10-CM

## 2014-12-05 DIAGNOSIS — I10 Essential (primary) hypertension: Secondary | ICD-10-CM

## 2014-12-05 IMAGING — CT CT ORBITS W/ CM
3 series · 14 of 47 positions shown, 16 images · IV contrast (Omnipaque 300)
Comparison: Head CT [DATE]

CLINICAL DATA: Acute onset of right orbital pain this morning.

EXAM:
CT ORBITS WITH CONTRAST
TECHNIQUE: Multidetector CT imaging of the orbits was performed following the
bolus administration of intravenous contrast.
CONTRAST:  75mL OMNIPAQUE IOHEXOL 300 MG/ML  SOLN

[Series 2: max st axial · axial · 0.34mm/px · z∈[+69,+169]mm · 8 of 58 slices shown, 10 images]
[im 4/58  brain]
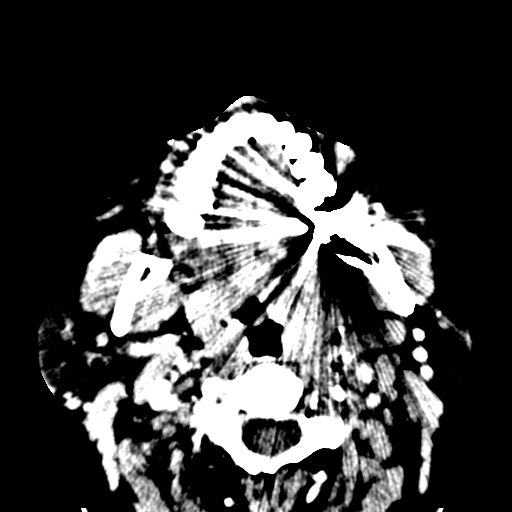
[im 4/58  bone]
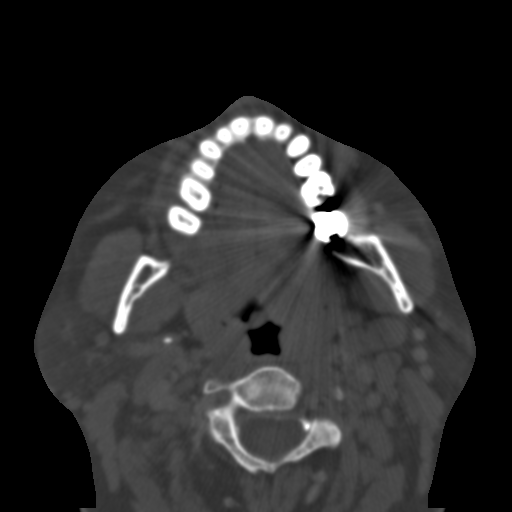
[im 12/58  bone]
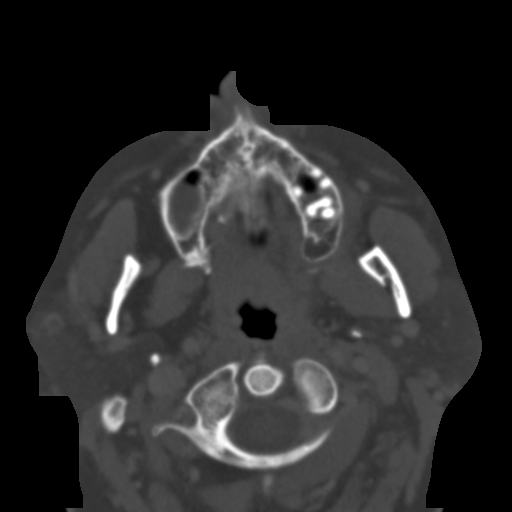
[im 18/58  bone]
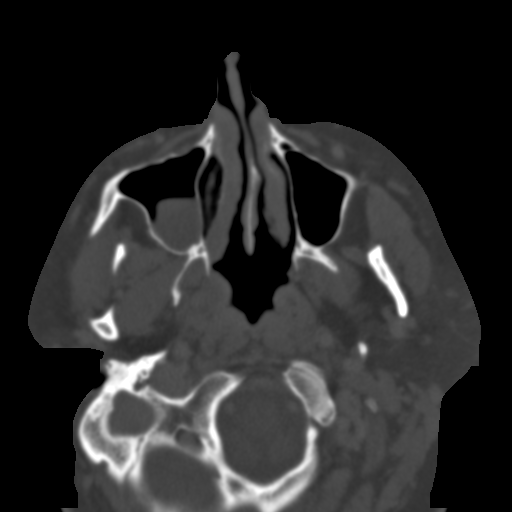
[im 26/58  bone]
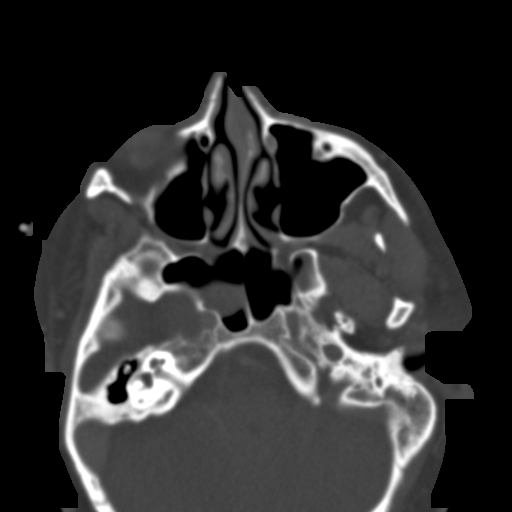
[im 32/58  brain]
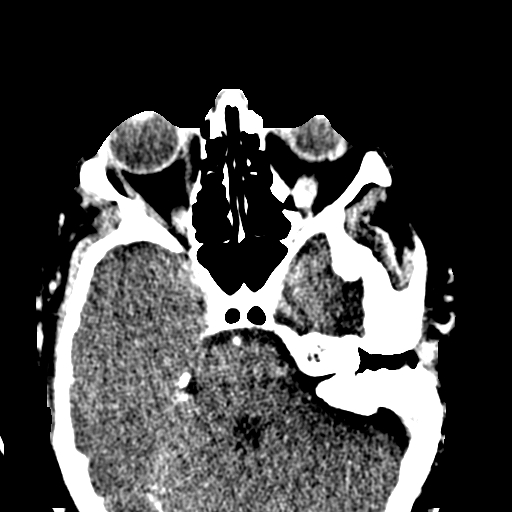
[im 32/58  bone]
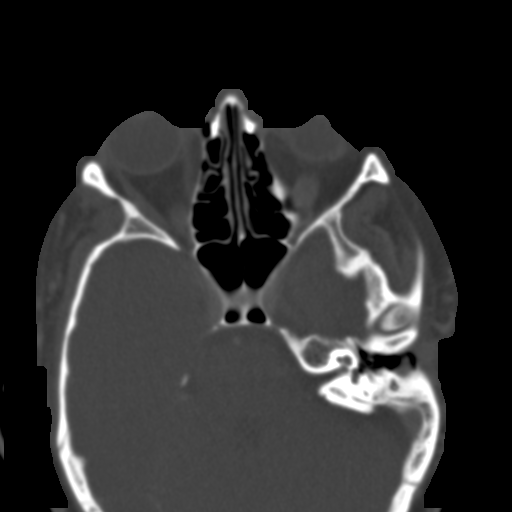
[im 40/58  bone]
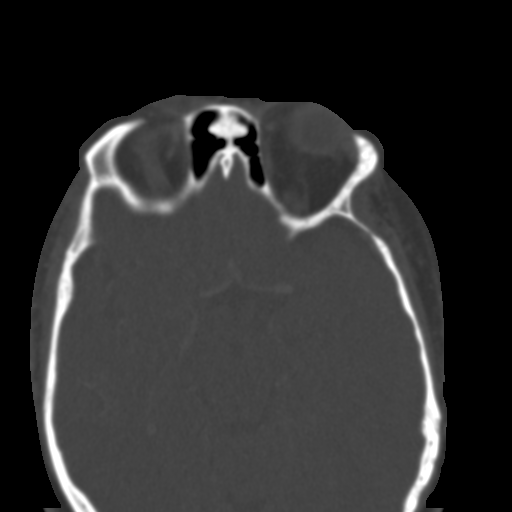
[im 46/58  bone]
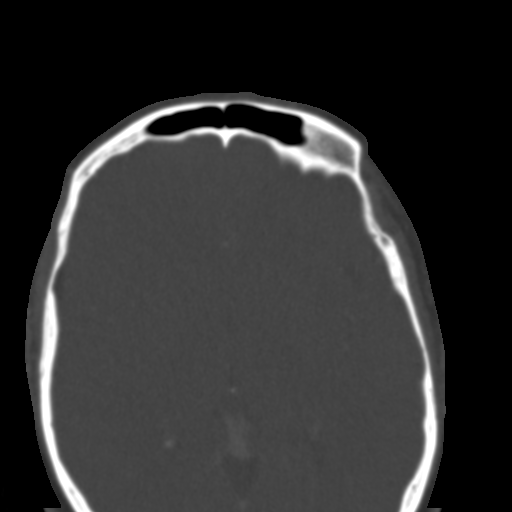
[im 54/58  bone]
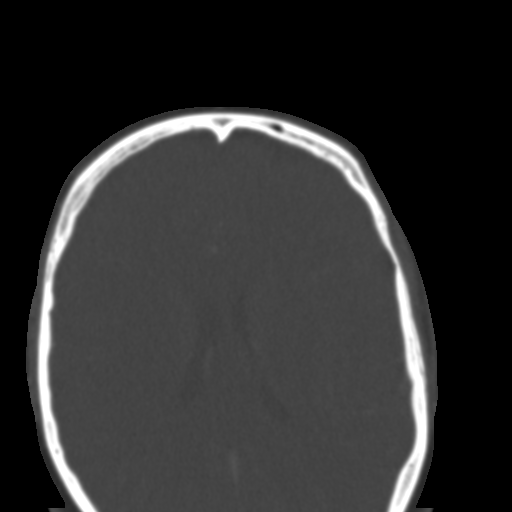

[Series 4: max st coro · coronal · 0.24mm/px · 3 of 89 slices shown]
[im 30/89  bone]
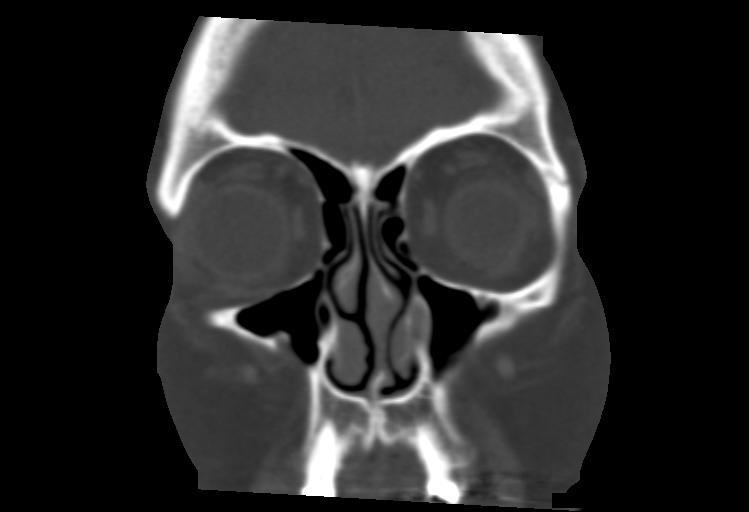
[im 40/89  bone]
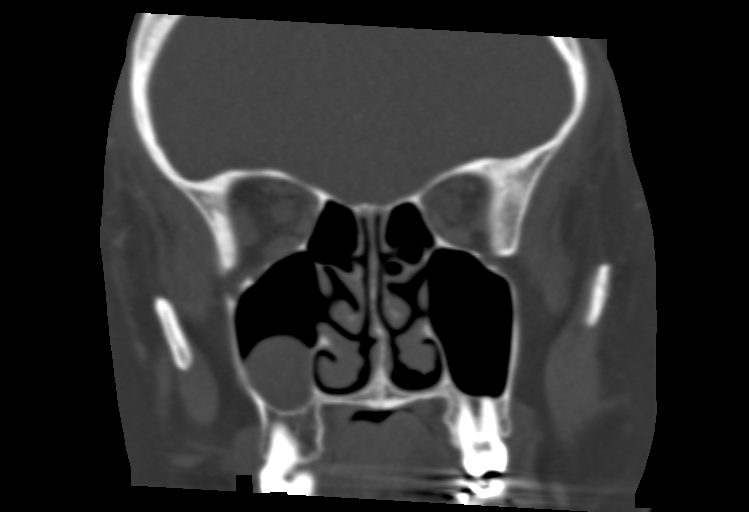
[im 49/89  bone]
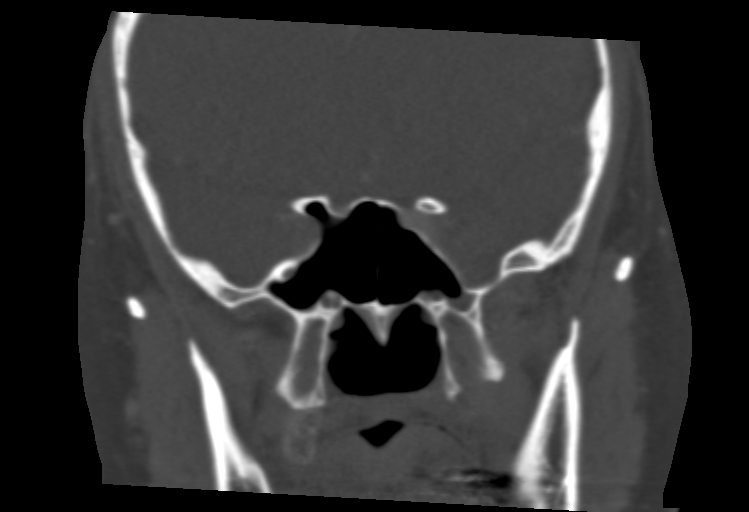

[Series 5: max st sag · sagittal · 0.25mm/px · 3 of 96 slices shown]
[im 32/96  bone]
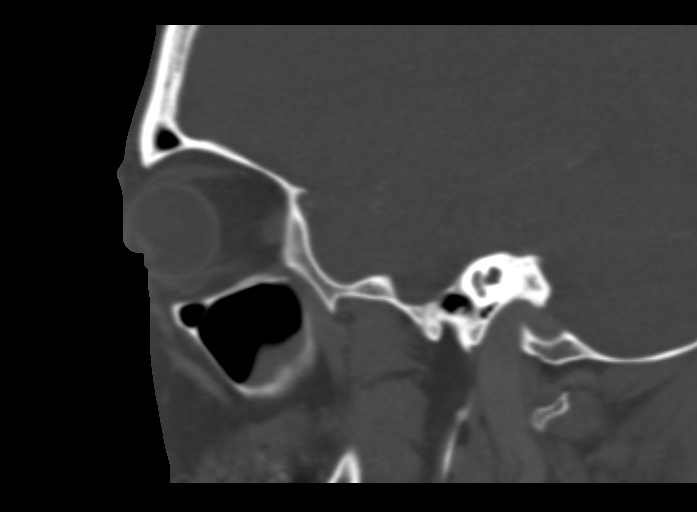
[im 48/96  bone]
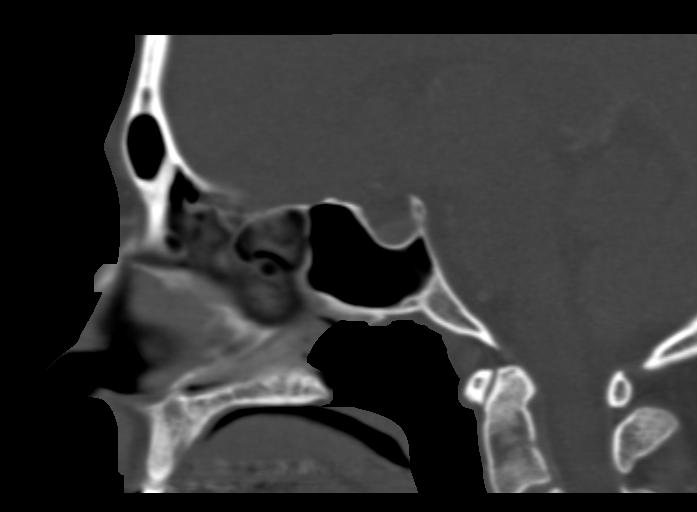
[im 64/96  bone]
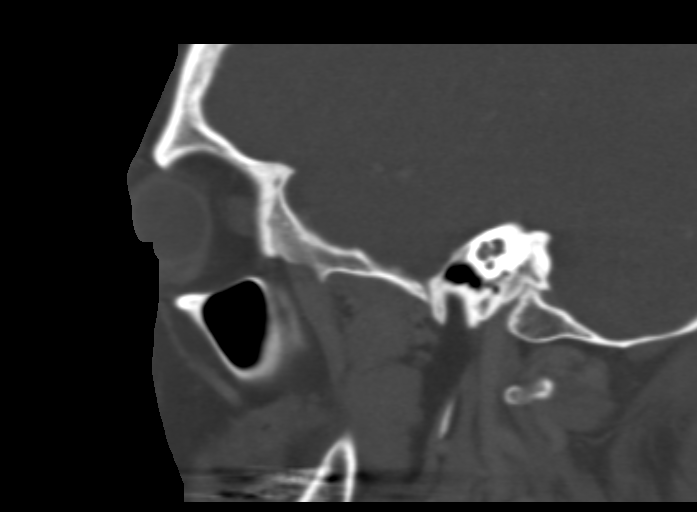

[14 of 47 positions shown; findings below may reference images not displayed]

FINDINGS: Both globes appear normal. The optic nerves appear symmetric and
normal. Extra-ocular muscles appear normal. Lacrimal glands are
normal. No abnormal vascular structures. Orbital fat appear
symmetric and normal. No abnormality seen in either orbital apex or
in either cavernous sinus region. Visualized intracranial structures
appear normal. No evidence of inflammatory sinus disease. There are
a few insignificant retention cysts. Other regional soft tissues
appear normal.
IMPRESSION: Normal exam.  No lesions seen to explain right orbital pain.

## 2014-12-05 MED ORDER — AMLODIPINE BESYLATE 10 MG PO TABS: 10.0000 mg | ORAL_TABLET | Freq: Every day | ORAL | Status: DC

## 2014-12-05 MED ORDER — FLUORESCEIN SODIUM 1 MG OP STRP
1.0000 | ORAL_STRIP | Freq: Once | OPHTHALMIC | Status: AC
  Administered 2014-12-05: 1 via OPHTHALMIC
  Filled 2014-12-05: qty 1

## 2014-12-05 MED ORDER — OXYCODONE-ACETAMINOPHEN 5-325 MG PO TABS: 1.0000 | ORAL_TABLET | ORAL | Status: DC | PRN

## 2014-12-05 MED ORDER — FLUORESCEIN SODIUM 1 MG OP STRP
1.0000 | ORAL_STRIP | Freq: Once | OPHTHALMIC | Status: AC
  Filled 2014-12-05: qty 1

## 2014-12-05 MED ORDER — HOMATROPINE HBR 5 % OP SOLN
2.0000 [drp] | Freq: Once | OPHTHALMIC | Status: AC
  Administered 2014-12-05: 2 [drp] via OPHTHALMIC
  Filled 2014-12-05: qty 5

## 2014-12-05 MED ORDER — OXYCODONE-ACETAMINOPHEN 5-325 MG PO TABS
2.0000 | ORAL_TABLET | Freq: Once | ORAL | Status: AC
  Administered 2014-12-05: 2 via ORAL
  Filled 2014-12-05: qty 2

## 2014-12-05 MED ORDER — CLONIDINE HCL 0.2 MG PO TABS
0.2000 mg | ORAL_TABLET | Freq: Once | ORAL | Status: AC
  Administered 2014-12-05: 0.2 mg via ORAL
  Filled 2014-12-05: qty 1

## 2014-12-05 MED ORDER — ERYTHROMYCIN 5 MG/GM OP OINT
TOPICAL_OINTMENT | Freq: Once | OPHTHALMIC | Status: AC
  Administered 2014-12-05: 1 via OPHTHALMIC
  Filled 2014-12-05: qty 3.5

## 2014-12-05 MED ORDER — HYDROMORPHONE HCL 1 MG/ML IJ SOLN
1.0000 mg | Freq: Once | INTRAMUSCULAR | Status: AC
  Administered 2014-12-05: 1 mg via INTRAVENOUS
  Filled 2014-12-05: qty 1

## 2014-12-05 MED ORDER — FLUORESCEIN SODIUM 1 MG OP STRP
ORAL_STRIP | OPHTHALMIC | Status: AC
  Administered 2014-12-05: 1
  Filled 2014-12-05: qty 1

## 2014-12-05 MED ORDER — TETRACAINE HCL 0.5 % OP SOLN
1.0000 [drp] | Freq: Once | OPHTHALMIC | Status: AC
  Administered 2014-12-05: 1 [drp] via OPHTHALMIC
  Filled 2014-12-05: qty 2

## 2014-12-05 MED ORDER — HYDROCODONE-ACETAMINOPHEN 5-325 MG PO TABS
1.0000 | ORAL_TABLET | Freq: Once | ORAL | Status: AC
  Administered 2014-12-05: 1 via ORAL
  Filled 2014-12-05: qty 1

## 2014-12-05 MED ORDER — KETOROLAC TROMETHAMINE 0.5 % OP SOLN
1.0000 [drp] | Freq: Once | OPHTHALMIC | Status: AC
  Administered 2014-12-05: 1 [drp] via OPHTHALMIC
  Filled 2014-12-05: qty 5

## 2014-12-05 MED ORDER — IOHEXOL 300 MG/ML  SOLN
75.0000 mL | Freq: Once | INTRAMUSCULAR | Status: AC | PRN
  Administered 2014-12-05: 75 mL via INTRAVENOUS

## 2014-12-05 MED ORDER — HYDROMORPHONE HCL 1 MG/ML IJ SOLN
1.0000 mg | Freq: Once | INTRAMUSCULAR | Status: AC
  Administered 2014-12-05: 1 mg via INTRAMUSCULAR
  Filled 2014-12-05: qty 1

## 2014-12-05 NOTE — ED Provider Notes (Signed)
CSN: [id number]     Arrival date & time 12/05/14  0850 History   First MD Initiated Contact with Patient 12/05/14 [phone]     Chief Complaint  Patient presents with  . Eye Pain     (Consider location/radiation/quality/duration/timing/severity/associated sxs/prior Treatment) The history is provided by the patient.   Maria Lang is a 34 y.o. female with a history of uncontrolled HTN presenting with right eye pain which woke her from sleep around 4 am today.  She wears extended wear contacts which she has had in for one week and took them out immediately when the pain began. Her right eye has been watery and red and pain is worsened when in bright sunlight.  She denies trauma and foreign body sensation.  She denies pain in the left eye but has noticed increased watering in this eye since arrival here. She denies any visual defects, but states her vision is very poor uncorrected and feels it is baseline at this time.  Her bp is very elevated today. Pt reports her bp when checked at home is usually quite elevated, usually around 180/110 to 120.  She has been changed to norvasc and coreg within the past 2 months but has not had improvement in bp.  She has taken her bp meds today.    Past Medical History  Diagnosis Date  . Hypertension    Past Surgical History  Procedure Laterality Date  . Ectopic pregnancy surgery  10/2013   Family History  Problem Relation Age of Onset  . Hypertension Mother   . Hypertension Father   . Hyperlipidemia Father   . Diabetes Brother   . Diabetes Other    History  Substance Use Topics  . Smoking status: Former Smoker -- 0.50 packs/day    Types: Cigarettes    Quit date: 06/10/2014  . Smokeless tobacco: Never Used  . Alcohol Use: No   OB History    Gravida Para Term Preterm AB TAB SAB Ectopic Multiple Living   4 3 3  1   1        Review of Systems  Constitutional: Negative for fever and chills.  HENT: Negative for congestion and sore throat.    Eyes: Positive for photophobia, pain and redness. Negative for visual disturbance.  Respiratory: Negative for chest tightness and shortness of breath.   Cardiovascular: Negative for chest pain.  Gastrointestinal: Negative for nausea and abdominal pain.  Genitourinary: Negative.   Musculoskeletal: Negative for joint swelling, arthralgias and neck pain.  Skin: Negative.  Negative for rash and wound.  Neurological: Negative for dizziness, weakness, light-headedness, numbness and headaches.  Psychiatric/Behavioral: Negative.       Allergies  Review of patient's allergies indicates no known allergies.  Home Medications   Prior to Admission medications   Medication Sig Start Date End Date Taking? Authorizing Provider  amLODipine (NORVASC) 5 MG tablet Take 5 mg by mouth daily.   Yes Historical Provider, MD  carvedilol (COREG) 12.5 MG tablet Take 12.5 mg by mouth daily.   Yes Historical Provider, MD  clonazePAM (KLONOPIN) 1 MG tablet Take 1 mg by mouth 3 (three) times daily as needed for anxiety.   Yes Historical Provider, MD  oxyCODONE-acetaminophen (PERCOCET/ROXICET) 5-325 MG per tablet Take 1 tablet by mouth every 4 (four) hours as needed. 12/05/14   Evalee Jefferson, PA-C   BP 195/126 mmHg  Pulse 78  Temp(Src) 98.4 F (36.9 C) (Oral)  Resp 18  Ht 5\' 7"  (1.702 m)  Wt 195 lb (  88.451 kg)  BMI 30.53 kg/m2  SpO2 99%  LMP 10/28/2014 Physical Exam  Constitutional: She appears well-developed and well-nourished.  Uncomfortable appearing.  HENT:  Head: Normocephalic and atraumatic.  Eyes: EOM are normal. Pupils are equal, round, and reactive to light. Lids are everted and swept, no foreign bodies found. Right eye exhibits no chemosis and no discharge. No foreign body present in the right eye. Right conjunctiva is injected. No scleral icterus.  Slit lamp exam:      The right eye shows no corneal abrasion, no corneal ulcer, no foreign body, no hyphema and no fluorescein uptake.  PT unable to  tolerate fundoscopic exam right eye secondary to severe pain with light.  She has consensual pain with light stimulus in left eye.  Peripheral fields equal and normal.  Visual acuity 20/200 os/od/ou which she states is about stable uncorrected.  Ocular pressure right eye measured x 3: 1,3 and 4 mm/hg resp.  Neck: Normal range of motion.  Cardiovascular: Normal rate, regular rhythm, normal heart sounds and intact distal pulses.   Pulmonary/Chest: Effort normal and breath sounds normal. She has no wheezes.  Abdominal: Soft. Bowel sounds are normal. There is no tenderness.  Musculoskeletal: Normal range of motion.  Neurological: She is alert.  Skin: Skin is warm and dry.  Psychiatric: She has a normal mood and affect.  Nursing note and vitals reviewed.   ED Course  Procedures (including critical care time)  Pt was given tetracaine to right eye which gave some but not complete relief of pain.  After slit lamp exam she was given homatropine gtt in right eye given suspected uveitis or iritis given consensual pain - this dilated the right pupil but did not improve her pain. Labs Review Labs Reviewed - No data to display  Imaging Review Ct Orbits W/cm  12/05/2014   CLINICAL DATA:  Acute onset of right orbital pain this morning.  EXAM: CT ORBITS WITH CONTRAST  TECHNIQUE: Multidetector CT imaging of the orbits was performed following the bolus administration of intravenous contrast.  CONTRAST:  38mL OMNIPAQUE IOHEXOL 300 MG/ML  SOLN  COMPARISON:  Head CT 08/30/2014  FINDINGS: Both globes appear normal. The optic nerves appear symmetric and normal. Extra-ocular muscles appear normal. Lacrimal glands are normal. No abnormal vascular structures. Orbital fat appear symmetric and normal. No abnormality seen in either orbital apex or in either cavernous sinus region. Visualized intracranial structures appear normal. No evidence of inflammatory sinus disease. There are a few insignificant retention cysts. Other  regional soft tissues appear normal.  IMPRESSION: Normal exam.  No lesions seen to explain right orbital pain.   Electronically Signed   By: Nelson Chimes M.D.   On: 12/05/2014 15:54     EKG Interpretation None      MDM   Final diagnoses:  Pain  Eye pain, right  Essential hypertension    Pt discussed with Dr. Lacinda Axon who also examined pt. Pt with chronic htn which pt states is stable for her.  She was advised she needs an office visit with her pcp as her bp's need much better control. Her norvasc was increased to 10 mg tab. Advised to double her 5 mg tabs until gone.  Orbital CT scan ordered, negative. Will obtain ophthalmology phone consult.  Dr. Iona Hansen requested.  Discussed with Dr Iona Hansen who suspects pt has completely removed epithelial layer from cornea which will not then have fluorescein uptake. Suggested treating as cyclo iritis, recommended cycloplegic (already on board), ketorolac gtt, erythromycin  ointment and patching the eye. Plan 24 hour follow here.  He will be available for phone consult if needed tomorrow.    Evalee Jefferson, PA-C 12/05/14 [place], MD 12/06/14 [phone]

## 2014-12-05 NOTE — ED Notes (Signed)
Patient transported to CT 

## 2014-12-05 NOTE — ED Notes (Signed)
Woke up at 0400 with pain to right eye.  Rates pain 8/10.  Pt sleeps in contact lens and lens had been in for one week.  Pt glasses or broken and not able to obtain visual acuity.  Denies drainage, says eyes have been watery.

## 2014-12-05 NOTE — Plan of Care (Signed)
Problem: Discharge Progression Outcomes Goal: Barriers To Progression Addressed/Resolved Outcome: Completed/Met Date Met:  12/05/14 Pt instructed to keep all follow up appointments

## 2014-12-05 NOTE — Discharge Instructions (Signed)
°  As discussed, your tests today do not give [place] the reason for your eye pain, but we suspect your probably have a large corneal abrasion.  Keep your patch in place until we see you tomorrow afternoon for a recheck of your symptoms.  Bring the eye medicines with you tomorrow as we may be reapplying these when we remove your patch.  Also I suggest increasing your Norvasc to 10 mg daily.  You have been prescribed this new dose - double up your 5 mg tablets until completed.  Hypertension Hypertension is another name for high blood pressure. High blood pressure forces your heart to work harder to pump blood. A blood pressure reading has two numbers, which includes a higher number over a lower number (example: 110/72). HOME CARE   Have your blood pressure rechecked by your doctor.  Only take medicine as told by your doctor. Follow the directions carefully. The medicine does not work as well if you skip doses. Skipping doses also puts you at risk for problems.  Do not smoke.  Monitor your blood pressure at home as told by your doctor. GET HELP IF:  You think you are having a reaction to the medicine you are taking.  You have repeat headaches or feel dizzy.  You have puffiness (swelling) in your ankles.  You have trouble with your vision. GET HELP RIGHT AWAY IF:   You get a very bad headache and are confused.  You feel weak, numb, or faint.  You get chest or belly (abdominal) pain.  You throw up (vomit).  You cannot breathe very well. MAKE SURE YOU:   Understand these instructions.  Will watch your condition.  Will get help right away if you are not doing well or get worse. Document Released: 11/29/2007 Document Revised: 06/17/2013 Document Reviewed: 04/04/2013 [facility] Patient Information 2015 [place], [place]. This information is not intended to replace advice given to you by your health care provider. Make sure you discuss any questions you have with your health care provider.

## 2014-12-06 ENCOUNTER — Encounter (HOSPITAL_COMMUNITY): Payer: Self-pay | Admitting: Emergency Medicine

## 2014-12-06 ENCOUNTER — Emergency Department (HOSPITAL_COMMUNITY)
Admission: EM | Admit: 2014-12-06 | Discharge: 2014-12-06 | Disposition: A | Discharge status: Home / Self Care | Payer: Medicaid Other | Attending: Emergency Medicine | Admitting: Emergency Medicine

## 2014-12-06 DIAGNOSIS — Z87891 Personal history of nicotine dependence: Secondary | ICD-10-CM | POA: Insufficient documentation

## 2014-12-06 DIAGNOSIS — I10 Essential (primary) hypertension: Secondary | ICD-10-CM | POA: Diagnosis not present

## 2014-12-06 DIAGNOSIS — R51 Headache: Secondary | ICD-10-CM | POA: Insufficient documentation

## 2014-12-06 DIAGNOSIS — Z79899 Other long term (current) drug therapy: Secondary | ICD-10-CM | POA: Insufficient documentation

## 2014-12-06 DIAGNOSIS — H5711 Ocular pain, right eye: Secondary | ICD-10-CM | POA: Diagnosis not present

## 2014-12-06 DIAGNOSIS — Z4800 Encounter for change or removal of nonsurgical wound dressing: Secondary | ICD-10-CM | POA: Diagnosis present

## 2014-12-06 MED ORDER — TETRACAINE HCL 0.5 % OP SOLN
1.0000 [drp] | Freq: Once | OPHTHALMIC | Status: AC
  Administered 2014-12-06: 1 [drp] via OPHTHALMIC
  Filled 2014-12-06: qty 2

## 2014-12-06 NOTE — ED Provider Notes (Signed)
CSN: [id number]     Arrival date & time 12/06/14  1546 History  This chart was scribed for non-physician practitioner, [facility] M. Janit Bern, NP,  working with Virgel Manifold, MD, by Peyton Bottoms ED Scribe. This patient was seen in room APFT21/APFT21 and the patient's care was started at 5:35 PM    Chief Complaint  Patient presents with  . Wound Check   Patient is a 34 y.o. female presenting with wound check. The history is provided by the patient. No language interpreter was used.  Wound Check Associated symptoms include headaches.   HPI Comments: Maria Lang is a 34 y.o. female with a PMHx of hypertension, who presents to the Emergency Department who returned for recheck of the right eye. Pt was seen by Evalee Jefferson, PA-C yesterday and was told to return today. She reports associated erythema and swelling to right eye onset yesterday after sleeping in contact lenses which have since gradually improved. Patient currently complains of associated headache that is mild on the right side of her head over her eye. She had a CT of the area yesterday that was normal.   Past Medical History  Diagnosis Date  . Hypertension    Past Surgical History  Procedure Laterality Date  . Ectopic pregnancy surgery  10/2013   Family History  Problem Relation Age of Onset  . Hypertension Mother   . Hypertension Father   . Hyperlipidemia Father   . Diabetes Brother   . Diabetes Other    History  Substance Use Topics  . Smoking status: Former Smoker -- 0.50 packs/day    Types: Cigarettes    Quit date: 06/10/2014  . Smokeless tobacco: Never Used  . Alcohol Use: No   OB History    Gravida Para Term Preterm AB TAB SAB Ectopic Multiple Living   4 3 3  1   1  3      Review of Systems  Eyes: Positive for pain, discharge (tearing) and redness.       Swelling to right eye  Neurological: Positive for headaches.  all other systems negative  Allergies  Review of patient's allergies indicates no known  allergies.  Home Medications   Prior to Admission medications   Medication Sig Start Date End Date Taking? Authorizing Provider  amLODipine (NORVASC) 10 MG tablet Take 1 tablet (10 mg total) by mouth daily. 12/05/14   Evalee Jefferson, PA-C  carvedilol (COREG) 12.5 MG tablet Take 12.5 mg by mouth daily.    Historical Provider, MD  clonazePAM (KLONOPIN) 1 MG tablet Take 1 mg by mouth 3 (three) times daily as needed for anxiety.    Historical Provider, MD  oxyCODONE-acetaminophen (PERCOCET/ROXICET) 5-325 MG per tablet Take 1 tablet by mouth every 4 (four) hours as needed. 12/05/14   Evalee Jefferson, PA-C   Triage Vitals: BP 159/99 mmHg  Pulse 81  Temp(Src) 98.4 F (36.9 C) (Oral)  Resp 20  SpO2 100%  LMP 10/28/2014  Physical Exam  Constitutional: She is oriented to person, place, and time. She appears well-developed and well-nourished. No distress.  HENT:  Head: Normocephalic and atraumatic.  Eyes: EOM are normal. Right conjunctiva is injected.  Slit lamp exam:      The right eye shows no corneal abrasion, no corneal ulcer and no fluorescein uptake.  Neck: Neck supple. No tracheal deviation present.  Cardiovascular: Normal rate.   Pulmonary/Chest: Effort normal. No respiratory distress.  Musculoskeletal: Normal range of motion.  Neurological: She is alert and oriented to person, place,  and time.  Skin: Skin is warm and dry.  Psychiatric: She has a normal mood and affect. Her behavior is normal.  Nursing note and vitals reviewed.  ED Course  Procedures (including critical care time) Tetracaine, eye stained, no abrasion noted. Eye irrigated with NSS, eye medication instilled that was prescribed yesterday and eye patch.   DIAGNOSTIC STUDIES: Oxygen Saturation is 100% on RA, normal by my interpretation.    COORDINATION OF CARE: 5:43 PM- Discussed plans to give patient tetracaine eye drops. Pt advised of plan for treatment and pt agrees. Imaging Review Ct Orbits W/cm  12/05/2014   CLINICAL  DATA:  Acute onset of right orbital pain this morning.  EXAM: CT ORBITS WITH CONTRAST  TECHNIQUE: Multidetector CT imaging of the orbits was performed following the bolus administration of intravenous contrast.  CONTRAST:  3mL OMNIPAQUE IOHEXOL 300 MG/ML  SOLN  COMPARISON:  Head CT 08/30/2014  FINDINGS: Both globes appear normal. The optic nerves appear symmetric and normal. Extra-ocular muscles appear normal. Lacrimal glands are normal. No abnormal vascular structures. Orbital fat appear symmetric and normal. No abnormality seen in either orbital apex or in either cavernous sinus region. Visualized intracranial structures appear normal. No evidence of inflammatory sinus disease. There are a few insignificant retention cysts. Other regional soft tissues appear normal.  IMPRESSION: Normal exam.  No lesions seen to explain right orbital pain.   Electronically Signed   By: Nelson Chimes M.D.   On: 12/05/2014 15:54   MDM  34 y.o. female here for recheck of the right eye after treatment yesterday. Patient has improved. Eye medications instilled and eye patch reapplied. Patient will follow up with Dr. Iona Hansen in the morning for further evaluation. Stable for d/c.   Final diagnoses:  Eye pain, right    I personally performed the services described in this documentation, which was scribed in my presence. The recorded information has been reviewed and is accurate.   [address] [place], [place] 12/06/14 1823  Virgel Manifold, MD 12/07/14 1534

## 2014-12-06 NOTE — ED Notes (Signed)
PT states she was told to return to ED today for wound recheck to right eye. Eye patch on pt's right eye upon arrival to ED and stated she woke up yesterday morning with redness and swelling to right eye.

## 2014-12-06 NOTE — ED Notes (Signed)
Eye patch applied.

## 2014-12-06 NOTE — Discharge Instructions (Signed)
Call Dr. Iona Hansen' office in the morning and schedule a time for follow up.

## 2014-12-09 ENCOUNTER — Other Ambulatory Visit (HOSPITAL_COMMUNITY): Payer: Self-pay

## 2014-12-09 ENCOUNTER — Encounter (HOSPITAL_COMMUNITY): Payer: Self-pay | Admitting: Emergency Medicine

## 2014-12-09 ENCOUNTER — Emergency Department (HOSPITAL_COMMUNITY): Payer: Medicaid Other

## 2014-12-09 ENCOUNTER — Inpatient Hospital Stay (HOSPITAL_COMMUNITY)
Admission: EM | Admit: 2014-12-09 | Discharge: 2014-12-11 | DRG: 313 | Disposition: A | Discharge status: Home / Self Care | Payer: Medicaid Other | Attending: Internal Medicine | Admitting: Internal Medicine

## 2014-12-09 DIAGNOSIS — I10 Essential (primary) hypertension: Secondary | ICD-10-CM | POA: Diagnosis present

## 2014-12-09 DIAGNOSIS — Z79899 Other long term (current) drug therapy: Secondary | ICD-10-CM

## 2014-12-09 DIAGNOSIS — Z23 Encounter for immunization: Secondary | ICD-10-CM

## 2014-12-09 DIAGNOSIS — K219 Gastro-esophageal reflux disease without esophagitis: Secondary | ICD-10-CM | POA: Diagnosis present

## 2014-12-09 DIAGNOSIS — Z87891 Personal history of nicotine dependence: Secondary | ICD-10-CM

## 2014-12-09 DIAGNOSIS — R079 Chest pain, unspecified: Principal | ICD-10-CM | POA: Diagnosis present

## 2014-12-09 DIAGNOSIS — R1031 Right lower quadrant pain: Secondary | ICD-10-CM

## 2014-12-09 DIAGNOSIS — I5032 Chronic diastolic (congestive) heart failure: Secondary | ICD-10-CM | POA: Diagnosis present

## 2014-12-09 DIAGNOSIS — I161 Hypertensive emergency: Secondary | ICD-10-CM

## 2014-12-09 LAB — COMPREHENSIVE METABOLIC PANEL
ALT: 19 U/L (ref 14–54)
AST: 18 U/L (ref 15–41)
Albumin: 4.8 g/dL (ref 3.5–5.0)
Alkaline Phosphatase: 72 U/L (ref 38–126)
Anion gap: 15 (ref 5–15)
BUN: 11 mg/dL (ref 6–20)
CO2: 24 mmol/L (ref 22–32)
Calcium: 9.8 mg/dL (ref 8.9–10.3)
Chloride: 99 mmol/L — ABNORMAL LOW (ref 101–111)
Creatinine, Ser: 1.02 mg/dL — ABNORMAL HIGH (ref 0.44–1.00)
GFR calc Af Amer: >60 mL/min (ref >60)
GFR calc non Af Amer: >60 mL/min (ref >60)
Glucose, Bld: 148 mg/dL — ABNORMAL HIGH (ref 65–99)
Potassium: 3.1 mmol/L — ABNORMAL LOW (ref 3.5–5.1)
Sodium: 138 mmol/L (ref 135–145)
Total Bilirubin: 0.1 mg/dL — ABNORMAL LOW (ref 0.3–1.2)
Total Protein: 8.2 g/dL — ABNORMAL HIGH (ref 6.5–8.1)

## 2014-12-09 LAB — CBC WITH DIFFERENTIAL/PLATELET
Basophils Absolute: 0 10*3/uL (ref 0.0–0.1)
Basophils Relative: 0 % (ref 0–1)
Eosinophils Absolute: 0 10*3/uL (ref 0.0–0.7)
Eosinophils Relative: 0 % (ref 0–5)
HCT: 38.8 % (ref 36.0–46.0)
Hemoglobin: 13.1 g/dL (ref 12.0–15.0)
Lymphocytes Relative: 30 % (ref 12–46)
Lymphs Abs: 2.5 10*3/uL (ref 0.7–4.0)
MCH: 28.2 pg (ref 26.0–34.0)
MCHC: 33.8 g/dL (ref 30.0–36.0)
MCV: 83.6 fL (ref 78.0–100.0)
Monocytes Absolute: 0.4 10*3/uL (ref 0.1–1.0)
Monocytes Relative: 5 % (ref 3–12)
Neutro Abs: 5.3 10*3/uL (ref 1.7–7.7)
Neutrophils Relative %: 65 % (ref 43–77)
Platelets: 303 10*3/uL (ref 150–400)
RBC: 4.64 MIL/uL (ref 3.87–5.11)
RDW: 14.3 % (ref 11.5–15.5)
WBC: 8.3 10*3/uL (ref 4.0–10.5)

## 2014-12-09 LAB — RAPID URINE DRUG SCREEN, HOSP PERFORMED
Amphetamines: NOT DETECTED
Barbiturates: NOT DETECTED
Benzodiazepines: NOT DETECTED
Cocaine: NOT DETECTED
Opiates: POSITIVE — AB
Tetrahydrocannabinol: NOT DETECTED

## 2014-12-09 LAB — TROPONIN I: Troponin I: <0.03 ng/mL (ref <0.031)

## 2014-12-09 LAB — PREGNANCY, URINE: Preg Test, Ur: NEGATIVE

## 2014-12-09 IMAGING — DX DG CHEST 2V
2 series · 2 of 2 positions shown · non-contrast
Comparison: [DATE]

CLINICAL DATA: Constant worsening right-sided chest pain onset 1
hour ago. Dry mouth and right arm numbness. Nausea.

EXAM:
CHEST  2 VIEW

[chest pa]
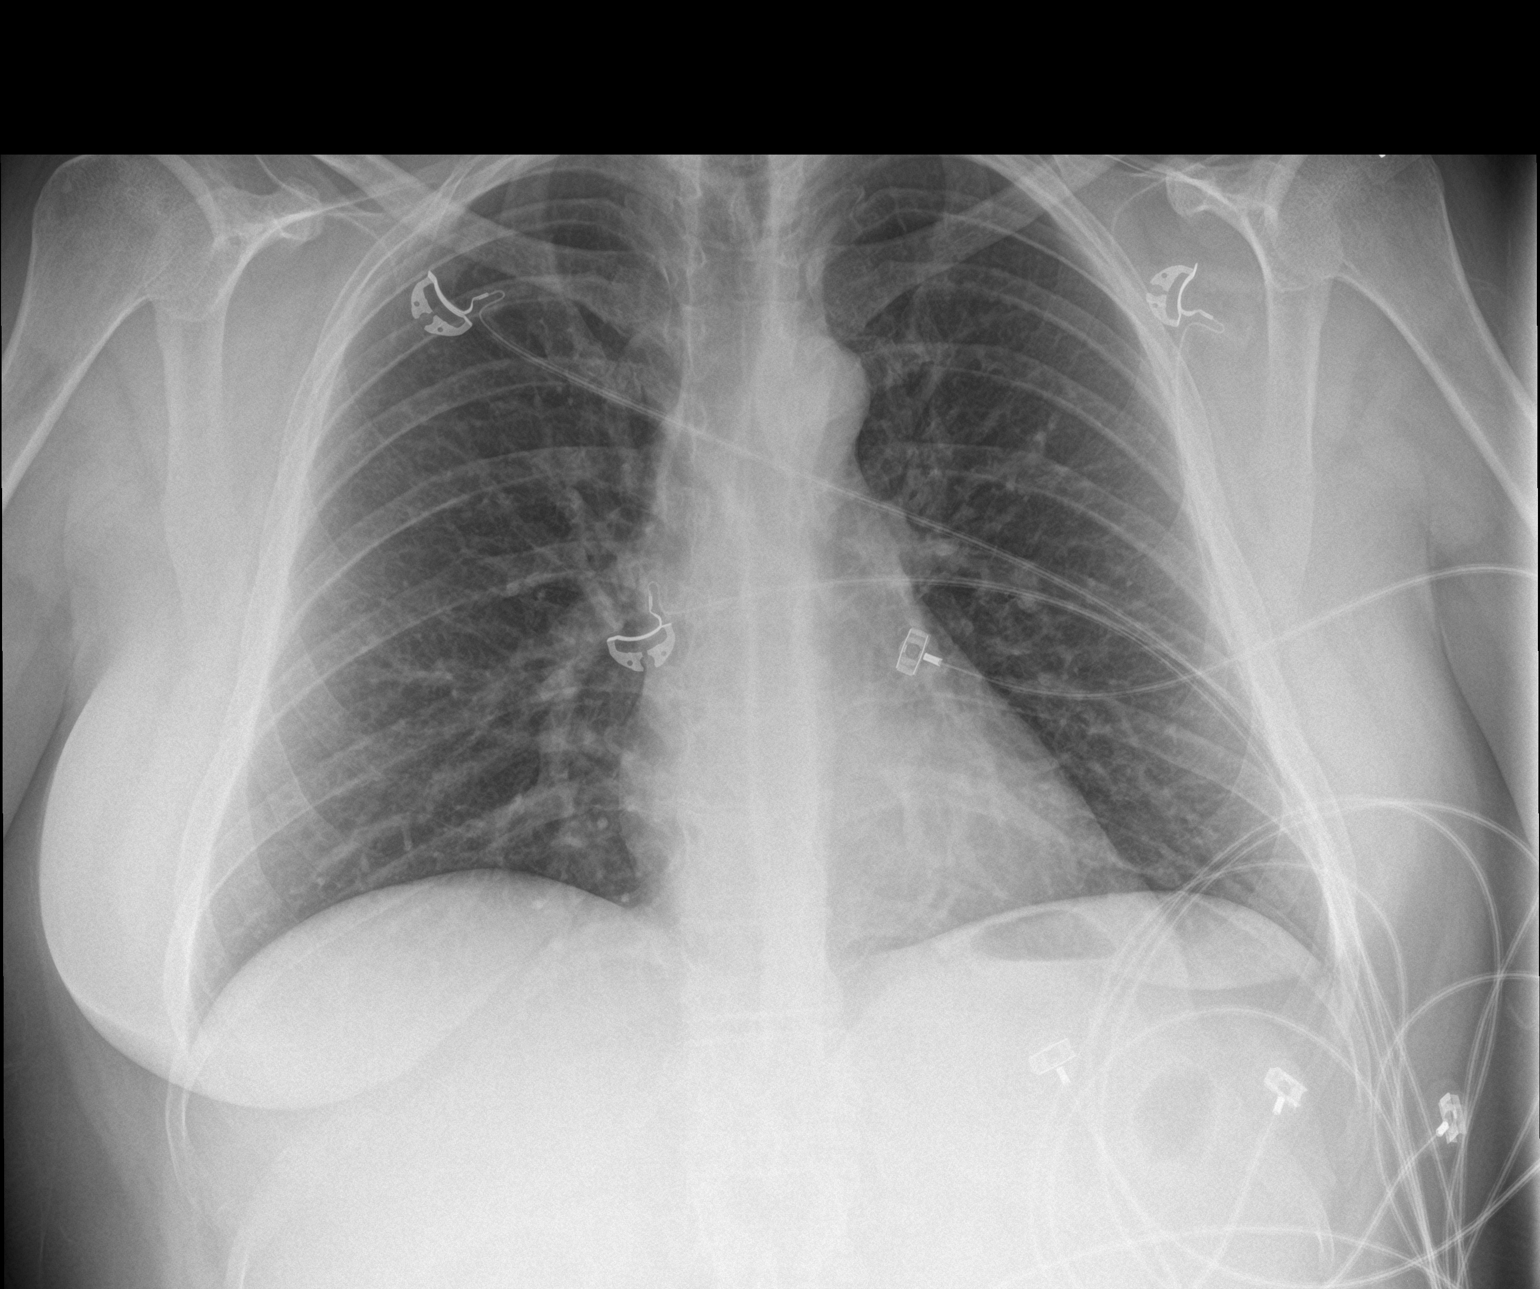

[chest lat]
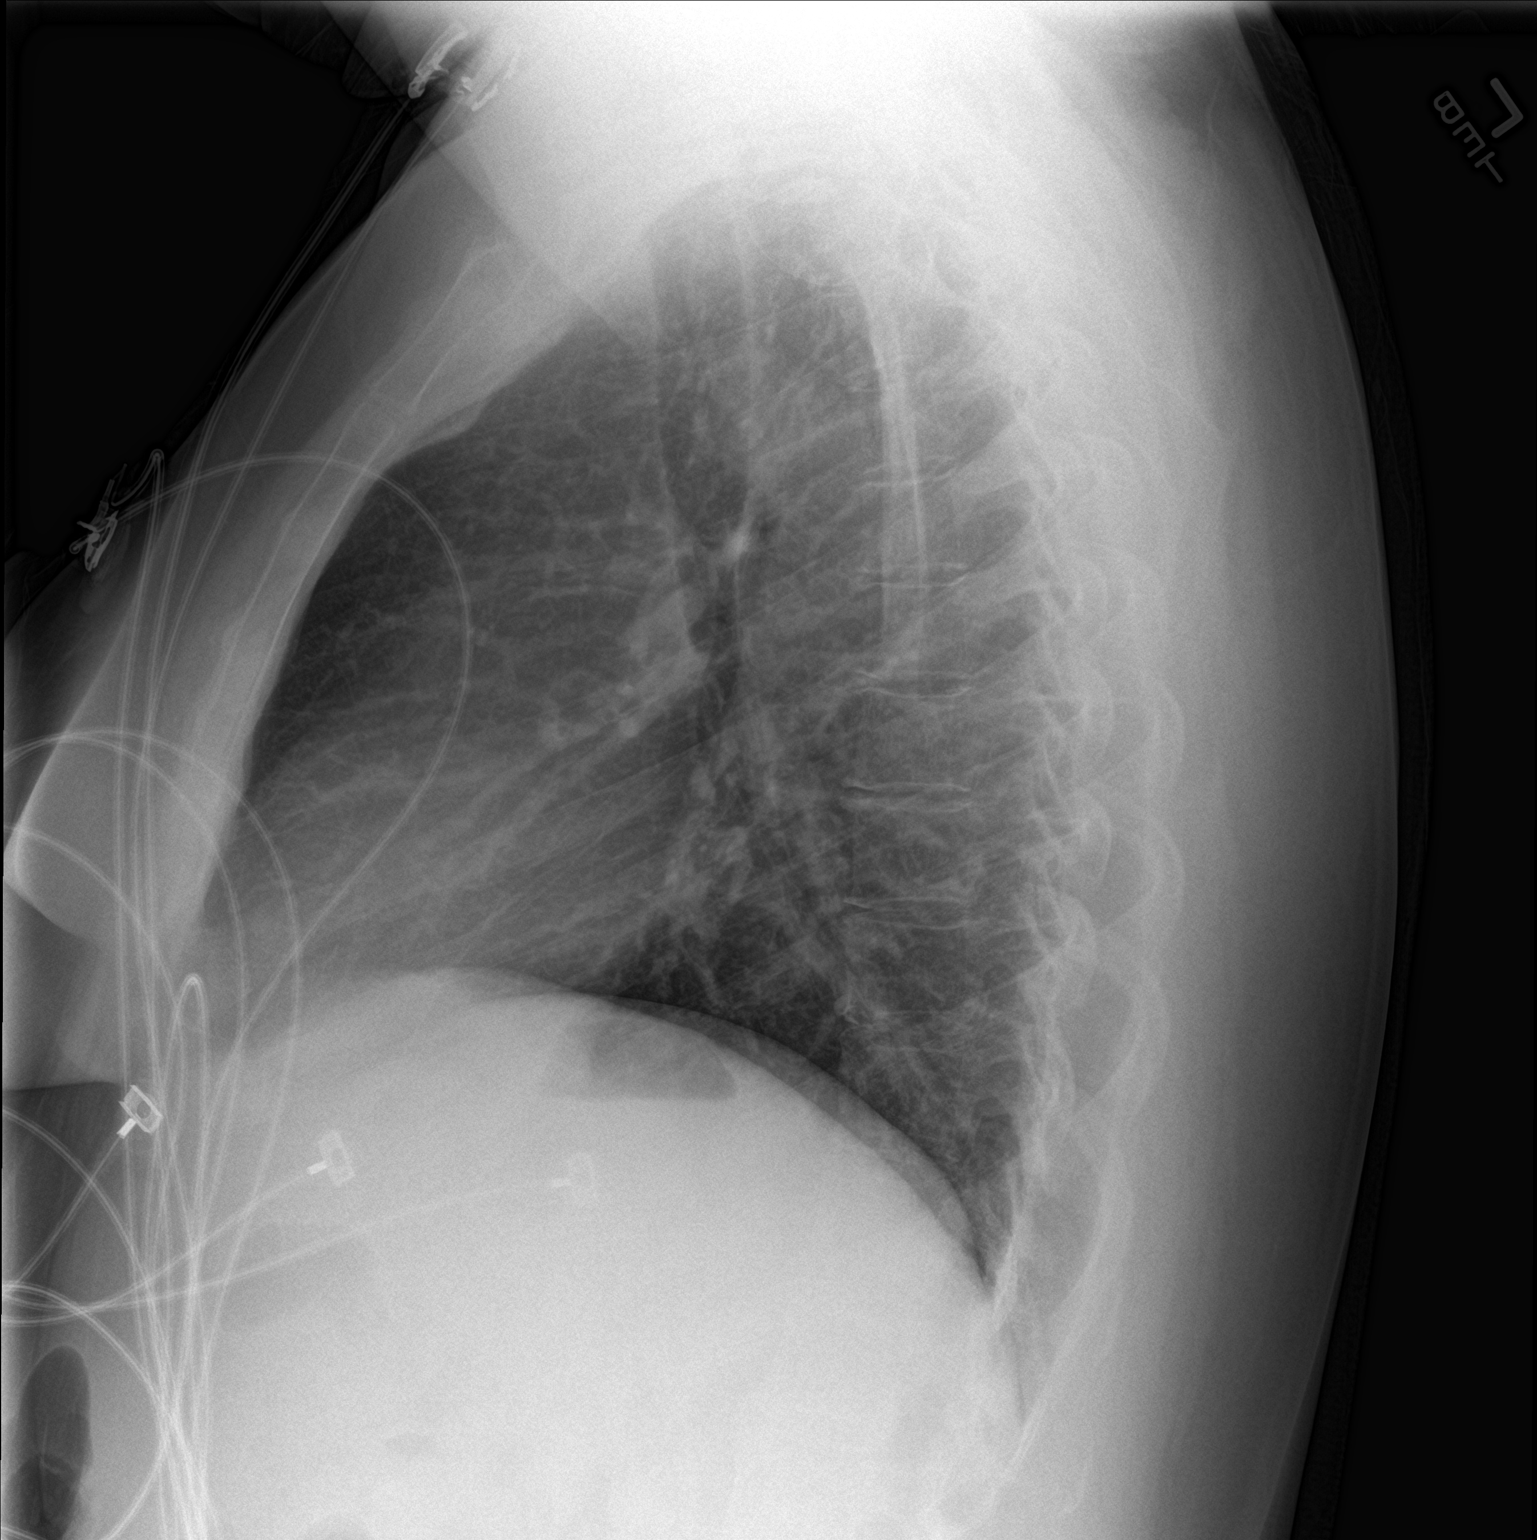

[2 of 2 positions shown; findings below may reference images not displayed]

FINDINGS: The heart size and mediastinal contours are within normal limits.
Both lungs are clear. The visualized skeletal structures are
unremarkable.
IMPRESSION: No active cardiopulmonary disease.

## 2014-12-09 MED ORDER — ASPIRIN 81 MG PO CHEW
324.0000 mg | CHEWABLE_TABLET | Freq: Once | ORAL | Status: AC
  Administered 2014-12-09: 324 mg via ORAL
  Filled 2014-12-09: qty 4

## 2014-12-09 MED ORDER — NITROGLYCERIN 0.4 MG SL SUBL
0.4000 mg | SUBLINGUAL_TABLET | SUBLINGUAL | Status: DC | PRN
  Administered 2014-12-09: 0.4 mg via SUBLINGUAL
  Filled 2014-12-09: qty 1

## 2014-12-09 MED ORDER — MORPHINE SULFATE 4 MG/ML IJ SOLN
4.0000 mg | Freq: Once | INTRAMUSCULAR | Status: AC
  Administered 2014-12-09: 4 mg via INTRAVENOUS
  Filled 2014-12-09: qty 1

## 2014-12-09 MED ORDER — IOHEXOL 350 MG/ML SOLN: 100.0000 mL | Freq: Once | INTRAVENOUS | Status: AC | PRN

## 2014-12-09 MED ORDER — POTASSIUM CHLORIDE CRYS ER 20 MEQ PO TBCR
40.0000 meq | EXTENDED_RELEASE_TABLET | Freq: Once | ORAL | Status: AC
  Administered 2014-12-10: 40 meq via ORAL
  Filled 2014-12-09: qty 2

## 2014-12-09 MED ORDER — METOPROLOL TARTRATE 1 MG/ML IV SOLN
5.0000 mg | Freq: Once | INTRAVENOUS | Status: AC
  Administered 2014-12-09: 5 mg via INTRAVENOUS
  Filled 2014-12-09: qty 5

## 2014-12-09 MED ORDER — NITROGLYCERIN IN D5W 200-5 MCG/ML-% IV SOLN
5.0000 ug/min | Freq: Once | INTRAVENOUS | Status: AC
  Administered 2014-12-09: 5 ug/min via INTRAVENOUS
  Filled 2014-12-09: qty 250

## 2014-12-09 MED ORDER — SODIUM CHLORIDE 0.9 % IV BOLUS (SEPSIS)
500.0000 mL | Freq: Once | INTRAVENOUS | Status: AC
  Administered 2014-12-09: 500 mL via INTRAVENOUS

## 2014-12-09 MED ORDER — LORAZEPAM 2 MG/ML IJ SOLN
1.0000 mg | Freq: Once | INTRAMUSCULAR | Status: AC
  Administered 2014-12-09: 1 mg via INTRAVENOUS
  Filled 2014-12-09: qty 1

## 2014-12-09 NOTE — ED Provider Notes (Addendum)
CSN: [id number]     Arrival date & time 12/09/14  2131 History  This chart was scribed for Virgel Manifold, MD by Julien Nordmann, ED Scribe. This patient was seen in room APA19/APA19 and the patient's care was started at 9:50 PM.    Chief Complaint  Patient presents with  . Chest Pain     The history is provided by the patient. No language interpreter was used.    HPI Comments: Maria Lang is a 34 y.o. female who has hx of hypertension presents to the Emergency Department complaining of constant, gradual worsening right side chest pain onset one hour ago. She states it is a pressure/tight sensation. Pt has associated dry mouth and right arm numbness onset one hour. Pt notes having a dry throat and is very nauseous. She notes checking her blood pressure PTA and it was higher than normal.  Mild SOB. No unusual swelling in legs, fevers, chills, hx of blood clots.   Past Medical History  Diagnosis Date  . Hypertension    Past Surgical History  Procedure Laterality Date  . Ectopic pregnancy surgery  10/2013   Family History  Problem Relation Age of Onset  . Hypertension Mother   . Hypertension Father   . Hyperlipidemia Father   . Diabetes Brother   . Diabetes Other    History  Substance Use Topics  . Smoking status: Former Smoker -- 0.50 packs/day    Types: Cigarettes    Quit date: 06/10/2014  . Smokeless tobacco: Never Used  . Alcohol Use: No   OB History    Gravida Para Term Preterm AB TAB SAB Ectopic Multiple Living   4 3 3  1   1  3      Review of Systems  Constitutional: Negative for fever and chills.  Respiratory: Negative for shortness of breath.   Cardiovascular: Positive for chest pain.  Neurological: Positive for numbness.  All other systems reviewed and are negative.     Allergies  Review of patient's allergies indicates no known allergies.  Home Medications   Prior to Admission medications   Medication Sig Start Date End Date Taking? Authorizing  Provider  amLODipine (NORVASC) 10 MG tablet Take 1 tablet (10 mg total) by mouth daily. 12/05/14   Evalee Jefferson, PA-C  carvedilol (COREG) 12.5 MG tablet Take 12.5 mg by mouth daily.    Historical Provider, MD  clonazePAM (KLONOPIN) 1 MG tablet Take 1 mg by mouth 3 (three) times daily as needed for anxiety.    Historical Provider, MD  oxyCODONE-acetaminophen (PERCOCET/ROXICET) 5-325 MG per tablet Take 1 tablet by mouth every 4 (four) hours as needed. 12/05/14   Evalee Jefferson, PA-C   Triage vitals: BP 169/125 mmHg  Pulse 104  Temp(Src) 98.2 F (36.8 C)  Resp 22  Ht 5\' 6"  (1.676 m)  Wt 190 lb (86.183 kg)  BMI 30.68 kg/m2  SpO2 100%  LMP 10/28/2014 Physical Exam  Constitutional: She is oriented to person, place, and time. She appears well-developed and well-nourished. No distress.  HENT:  Head: Normocephalic.  Eyes: Conjunctivae are normal. Pupils are equal, round, and reactive to light. No scleral icterus.  Neck: Normal range of motion. Neck supple. No thyromegaly present.  Cardiovascular: Normal rate and regular rhythm.  Exam reveals no gallop and no friction rub.   No murmur heard. Pulmonary/Chest: Effort normal and breath sounds normal. No respiratory distress. She has no wheezes. She has no rales. She exhibits no tenderness.  Abdominal: Soft. Bowel sounds are  normal. She exhibits no distension. There is no tenderness. There is no rebound.  Musculoskeletal: Normal range of motion.  Lower extremities symmetric as compared to each other. No calf tenderness. Negative Homan's. No palpable cords.   Neurological: She is alert and oriented to person, place, and time.  Skin: Skin is warm and dry. No rash noted.  Psychiatric: She has a normal mood and affect. Her behavior is normal.  Nursing note and vitals reviewed.   ED Course  Procedures   CRITICAL CARE Performed by: Virgel Manifold Total critical care time: 35 minutes Critical care time was exclusive of separately billable procedures and  treating other patients. Critical care was necessary to treat or prevent imminent or life-threatening deterioration. Critical care was time spent personally by me on the following activities: development of treatment plan with patient and/or surrogate as well as nursing, discussions with consultants, evaluation of patient's response to treatment, examination of patient, obtaining history from patient or surrogate, ordering and performing treatments and interventions, ordering and review of laboratory studies, ordering and review of radiographic studies, pulse oximetry and re-evaluation of patient's condition.  DIAGNOSTIC STUDIES: Oxygen Saturation is 100% on RA, normal by my interpretation.  COORDINATION OF CARE:  9:53 PM Discussed treatment plan which includes aspirin with pt at bedside and pt agreed to plan.  Labs Review Labs Reviewed  COMPREHENSIVE METABOLIC PANEL - Abnormal; Notable for the following:    Potassium 3.1 (*)    Chloride 99 (*)    Glucose, Bld 148 (*)    Creatinine, Ser 1.02 (*)    Total Protein 8.2 (*)    Total Bilirubin 0.1 (*)    All other components within normal limits  CBC WITH DIFFERENTIAL/PLATELET  TROPONIN I  URINE RAPID DRUG SCREEN, HOSP PERFORMED  PREGNANCY, URINE    Imaging Review Dg Chest 2 View  12/09/2014   CLINICAL DATA:  Constant worsening right-sided chest pain onset 1 hour ago. Dry mouth and right arm numbness. Nausea.  EXAM: CHEST  2 VIEW  COMPARISON:  08/30/2014  FINDINGS: The heart size and mediastinal contours are within normal limits. Both lungs are clear. The visualized skeletal structures are unremarkable.  IMPRESSION: No active cardiopulmonary disease.   Electronically Signed   By: Lucienne Capers M.D.   On: 12/09/2014 22:21     EKG Interpretation   Date/Time:  Wednesday December 09 2014 21:40:06 EDT Ventricular Rate:  104 PR Interval:  194 QRS Duration: 84 QT Interval:  327 QTC Calculation: 430 R Axis:   67 Text Interpretation:  Sinus  tachycardia inferior and anterolateral ST  changes new from previous Confirmed by [place]  MD, Mitul Hallowell ([id number]) on  12/09/2014 9:43:11 PM      MDM   Final diagnoses:  Chest pain, unspecified chest pain type  Hypertensive emergency    34yF with CP with some concerning features and new EKG changes. Very hypertensive. Pain improved with initial SL nitro. Remains with some symptoms though and BP not much improved after dose of metoprolol. Will start on nitro gtt. Resting tachycardia. Mild SOB. No hypoxemia, but will CT to eval for possible PE. Consider dissection, but I feel less likely. Will need admit. Will discuss with cards for possible transfer to [facility] with new EKG changes.   Continues to have CP. Atypical for ACS. Will check another troponin. Titrate up on nitro. Not sure if this is truly hypertensive emergency. BP has been in this range as most recently as a couple days ago, but cannot ignore EKG  changes.   [facility] called back. Dr Claiborne Billings apparently dealing with Code STEMI. To call back when free.   Virgel Manifold, MD 12/10/14 [phone]

## 2014-12-09 NOTE — ED Notes (Signed)
Prior to nitro drip being started, pain is rated as a 5 on pain scale, bp 162/122, hr 101

## 2014-12-09 NOTE — ED Notes (Signed)
Pt states that her blood pressure normally runs around 160-170 over 120-130 at home,

## 2014-12-09 NOTE — ED Notes (Signed)
Pt describes the chest pain as a burning sensation with heaviness,

## 2014-12-09 NOTE — ED Notes (Signed)
Pt c/o rt chest pain and rt arm numbness.

## 2014-12-09 NOTE — ED Notes (Signed)
Pt c/o chest pressure, burning that starts in middle of chest and radiates to right side, pain is associated with sob at times, denies any nausea, vomiting, pt is anxious in room with family, ST on monitor,

## 2014-12-10 ENCOUNTER — Observation Stay (HOSPITAL_COMMUNITY): Payer: Medicaid Other

## 2014-12-10 ENCOUNTER — Encounter (HOSPITAL_COMMUNITY): Payer: Self-pay

## 2014-12-10 DIAGNOSIS — R079 Chest pain, unspecified: Secondary | ICD-10-CM | POA: Diagnosis not present

## 2014-12-10 DIAGNOSIS — I5032 Chronic diastolic (congestive) heart failure: Secondary | ICD-10-CM | POA: Diagnosis present

## 2014-12-10 DIAGNOSIS — K219 Gastro-esophageal reflux disease without esophagitis: Secondary | ICD-10-CM | POA: Diagnosis present

## 2014-12-10 DIAGNOSIS — I161 Hypertensive emergency: Secondary | ICD-10-CM | POA: Diagnosis present

## 2014-12-10 DIAGNOSIS — I10 Essential (primary) hypertension: Secondary | ICD-10-CM | POA: Diagnosis present

## 2014-12-10 DIAGNOSIS — Z87891 Personal history of nicotine dependence: Secondary | ICD-10-CM | POA: Diagnosis not present

## 2014-12-10 DIAGNOSIS — Z79899 Other long term (current) drug therapy: Secondary | ICD-10-CM | POA: Diagnosis not present

## 2014-12-10 DIAGNOSIS — Z23 Encounter for immunization: Secondary | ICD-10-CM | POA: Diagnosis not present

## 2014-12-10 LAB — TROPONIN I
Troponin I: <0.03 ng/mL (ref <0.031)
Troponin I: <0.03 ng/mL (ref <0.031)
Troponin I: <0.03 ng/mL (ref <0.031)

## 2014-12-10 LAB — BASIC METABOLIC PANEL
Anion gap: 11 (ref 5–15)
BUN: 11 mg/dL (ref 6–20)
CALCIUM: 9.1 mg/dL (ref 8.9–10.3)
CO2: 26 mmol/L (ref 22–32)
CREATININE: 0.81 mg/dL (ref 0.44–1.00)
Chloride: 100 mmol/L — ABNORMAL LOW (ref 101–111)
GFR calc Af Amer: >60 mL/min (ref >60)
GLUCOSE: 107 mg/dL — AB (ref 65–99)
Potassium: 3.2 mmol/L — ABNORMAL LOW (ref 3.5–5.1)
Sodium: 137 mmol/L (ref 135–145)

## 2014-12-10 LAB — CBC
HEMATOCRIT: 35.4 % — AB (ref 36.0–46.0)
Hemoglobin: 11.5 g/dL — ABNORMAL LOW (ref 12.0–15.0)
MCH: 27.3 pg (ref 26.0–34.0)
MCHC: 32.5 g/dL (ref 30.0–36.0)
MCV: 83.9 fL (ref 78.0–100.0)
PLATELETS: 264 10*3/uL (ref 150–400)
RBC: 4.22 MIL/uL (ref 3.87–5.11)
RDW: 14.4 % (ref 11.5–15.5)
WBC: 8 10*3/uL (ref 4.0–10.5)

## 2014-12-10 LAB — LIPID PANEL
CHOL/HDL RATIO: 7.5 ratio
CHOLESTEROL: 202 mg/dL — AB (ref 0–200)
HDL: 27 mg/dL — AB (ref >40)
LDL Cholesterol: UNDETERMINED mg/dL (ref 0–99)
TRIGLYCERIDES: 442 mg/dL — AB (ref <150)
VLDL: UNDETERMINED mg/dL (ref 0–40)

## 2014-12-10 LAB — PROTIME-INR
INR: 1.13 (ref 0.00–1.49)
Prothrombin Time: 14.7 seconds (ref 11.6–15.2)

## 2014-12-10 LAB — LIPASE, BLOOD: LIPASE: 16 U/L — AB (ref 22–51)

## 2014-12-10 LAB — MRSA PCR SCREENING: MRSA by PCR: NEGATIVE

## 2014-12-10 IMAGING — CT CT ANGIO CHEST
2 of 6 series · 6 of 36 positions shown · IV contrast (omnipaque)
Comparison: Chest radiograph performed [DATE]

CLINICAL DATA: Acute onset of chronically worsening right-sided
chest pain and pressure. Right arm numbness and dry mouth. Mild
shortness of breath and severe nausea. Initial encounter.

EXAM:
CT ANGIOGRAPHY CHEST WITH CONTRAST
TECHNIQUE: Multidetector CT imaging of the chest was performed using the
standard protocol during bolus administration of intravenous
contrast. Multiplanar CT image reconstructions and MIPs were
obtained to evaluate the vascular anatomy.
CONTRAST:  100mL OMNIPAQUE IOHEXOL 350 MG/ML SOLN

[Series 4: pe 3.0 b40f · axial · 0.59mm/px · z∈[-217,-70]mm · 5 of 75 slices shown]
[im 13/75  lung]
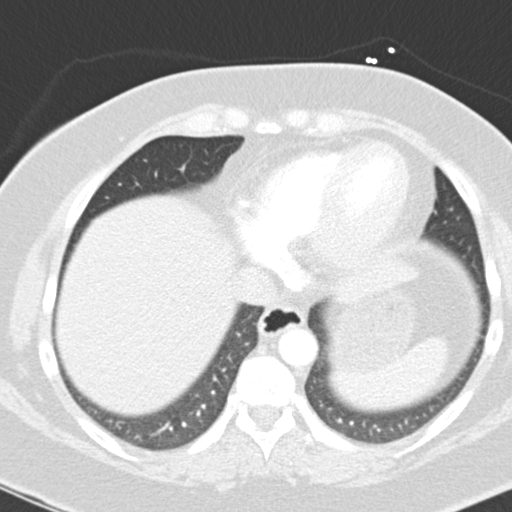
[im 25/75  mediastinal]
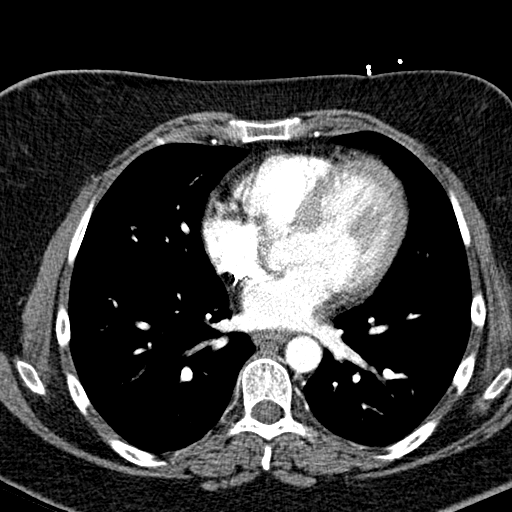
[im 38/75  lung]
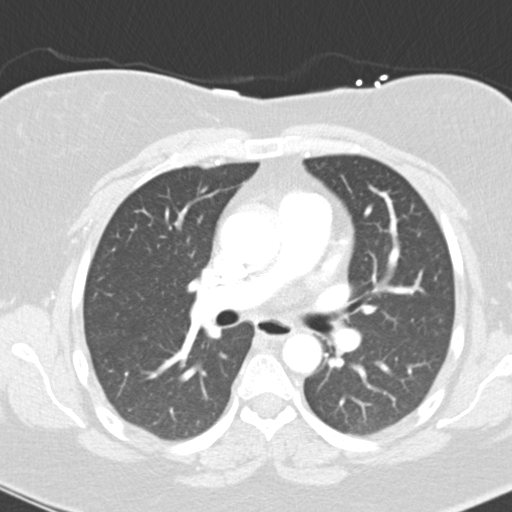
[im 50/75  mediastinal]
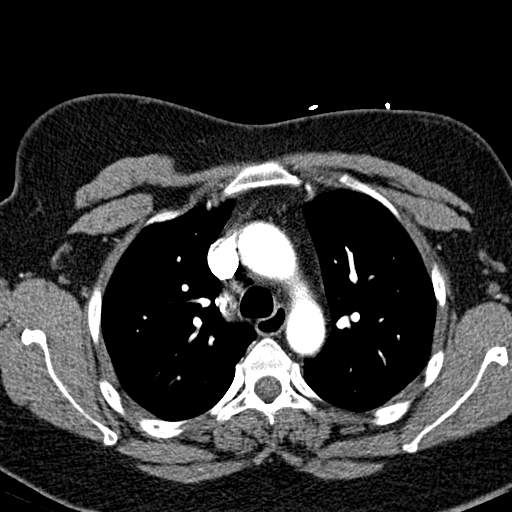
[im 62/75  lung]
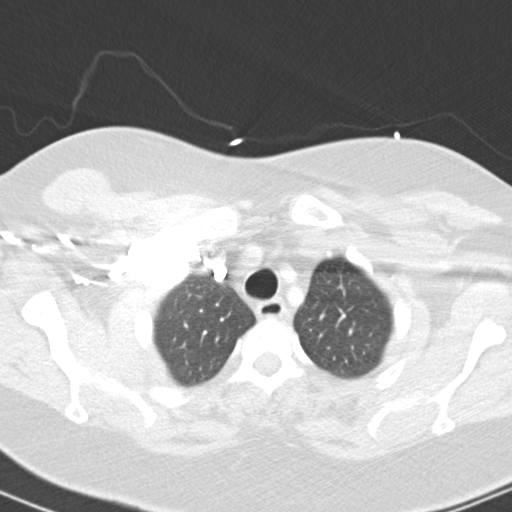

[Series 6: mpr coronal pe 3mm · coronal · 0.45mm/px · 1 of 76 slices shown]
[im 38/76  mediastinal]
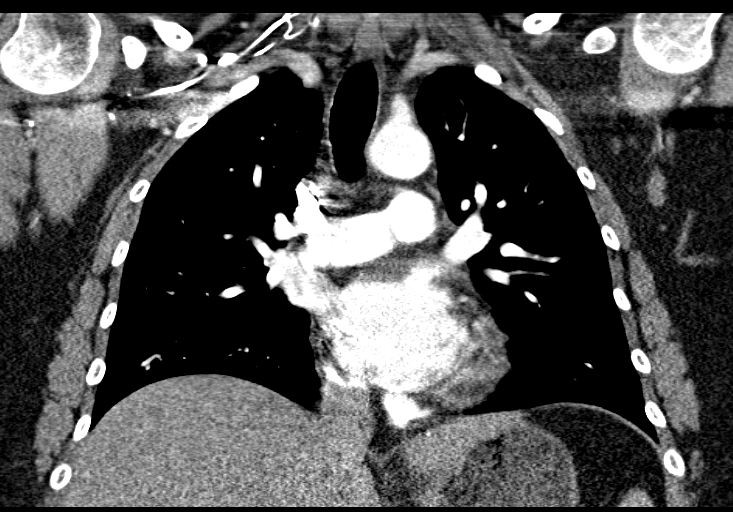

[6 of 36 positions shown; findings below may reference images not displayed]

FINDINGS: There is no evidence of pulmonary embolus.

The lungs are clear bilaterally. There is no evidence of significant
focal consolidation, pleural effusion or pneumothorax. No masses are
identified; no abnormal focal contrast enhancement is seen.

The mediastinum is unremarkable in appearance. No mediastinal
lymphadenopathy is seen. No pericardial effusion is identified. The
great vessels are grossly unremarkable in appearance. No axillary
lymphadenopathy is seen. The visualized portions of the thyroid
gland are unremarkable in appearance.

The visualized portions of the liver and spleen are unremarkable.

No acute osseous abnormalities are seen.

Review of the MIP images confirms the above findings.
IMPRESSION: 1. No evidence of pulmonary embolus.
2. Lungs clear bilaterally.

## 2014-12-10 MED ORDER — GI COCKTAIL ~~LOC~~
30.0000 mL | Freq: Once | ORAL | Status: AC
  Administered 2014-12-10: 30 mL via ORAL
  Filled 2014-12-10: qty 30

## 2014-12-10 MED ORDER — MORPHINE SULFATE 4 MG/ML IJ SOLN
4.0000 mg | Freq: Once | INTRAMUSCULAR | Status: AC
  Administered 2014-12-10: 4 mg via INTRAVENOUS
  Filled 2014-12-10: qty 1

## 2014-12-10 MED ORDER — PNEUMOCOCCAL VAC POLYVALENT 25 MCG/0.5ML IJ INJ
0.5000 mL | INJECTION | INTRAMUSCULAR | Status: AC
  Administered 2014-12-11: 0.5 mL via INTRAMUSCULAR
  Filled 2014-12-10: qty 0.5

## 2014-12-10 MED ORDER — LABETALOL HCL 5 MG/ML IV SOLN
20.0000 mg | Freq: Once | INTRAVENOUS | Status: AC
  Administered 2014-12-10: 20 mg via INTRAVENOUS
  Filled 2014-12-10: qty 4

## 2014-12-10 MED ORDER — IOHEXOL 350 MG/ML SOLN
100.0000 mL | Freq: Once | INTRAVENOUS | Status: AC | PRN
  Administered 2014-12-10: 100 mL via INTRAVENOUS

## 2014-12-10 MED ORDER — ONDANSETRON HCL 4 MG/2ML IJ SOLN: 4.0000 mg | Freq: Four times a day (QID) | INTRAMUSCULAR | Status: DC | PRN

## 2014-12-10 MED ORDER — AMLODIPINE BESYLATE 5 MG PO TABS
10.0000 mg | ORAL_TABLET | Freq: Every day | ORAL | Status: DC
  Administered 2014-12-10 – 2014-12-11 (×2): 10 mg via ORAL
  Filled 2014-12-10 (×2): qty 2

## 2014-12-10 MED ORDER — LISINOPRIL 10 MG PO TABS
10.0000 mg | ORAL_TABLET | Freq: Every day | ORAL | Status: DC
  Administered 2014-12-10 – 2014-12-11 (×2): 10 mg via ORAL
  Filled 2014-12-10 (×2): qty 1

## 2014-12-10 MED ORDER — ENOXAPARIN SODIUM 100 MG/ML ~~LOC~~ SOLN
1.0000 mg/kg | Freq: Two times a day (BID) | SUBCUTANEOUS | Status: DC
  Administered 2014-12-10 – 2014-12-11 (×3): 85 mg via SUBCUTANEOUS
  Filled 2014-12-10 (×3): qty 1

## 2014-12-10 MED ORDER — SODIUM CHLORIDE 0.9 % IV SOLN: 250.0000 mL | INTRAVENOUS | Status: DC | PRN

## 2014-12-10 MED ORDER — ASPIRIN 300 MG RE SUPP
300.0000 mg | RECTAL | Status: AC
  Filled 2014-12-10: qty 1

## 2014-12-10 MED ORDER — CLONAZEPAM 0.5 MG PO TABS: 1.0000 mg | ORAL_TABLET | Freq: Three times a day (TID) | ORAL | Status: DC | PRN

## 2014-12-10 MED ORDER — MORPHINE SULFATE 4 MG/ML IJ SOLN
4.0000 mg | INTRAMUSCULAR | Status: DC | PRN
  Administered 2014-12-10 – 2014-12-11 (×3): 4 mg via INTRAVENOUS
  Filled 2014-12-10 (×3): qty 1

## 2014-12-10 MED ORDER — ASPIRIN 81 MG PO CHEW: 324.0000 mg | CHEWABLE_TABLET | ORAL | Status: AC

## 2014-12-10 MED ORDER — HYDROCHLOROTHIAZIDE 25 MG PO TABS
12.5000 mg | ORAL_TABLET | Freq: Every day | ORAL | Status: DC
  Administered 2014-12-10 – 2014-12-11 (×2): 12.5 mg via ORAL
  Filled 2014-12-10 (×2): qty 1

## 2014-12-10 MED ORDER — OXYCODONE-ACETAMINOPHEN 5-325 MG PO TABS: 1.0000 | ORAL_TABLET | ORAL | Status: DC | PRN

## 2014-12-10 MED ORDER — NITROGLYCERIN IN D5W 200-5 MCG/ML-% IV SOLN
3.0000 ug/min | INTRAVENOUS | Status: DC
  Administered 2014-12-10: 10 ug/min via INTRAVENOUS

## 2014-12-10 MED ORDER — SODIUM CHLORIDE 0.9 % IJ SOLN: 3.0000 mL | INTRAMUSCULAR | Status: DC | PRN

## 2014-12-10 MED ORDER — ACETAMINOPHEN 325 MG PO TABS
650.0000 mg | ORAL_TABLET | ORAL | Status: DC | PRN
  Administered 2014-12-10: 650 mg via ORAL
  Filled 2014-12-10: qty 2

## 2014-12-10 MED ORDER — SODIUM CHLORIDE 0.9 % IJ SOLN
3.0000 mL | Freq: Two times a day (BID) | INTRAMUSCULAR | Status: DC
  Administered 2014-12-10 – 2014-12-11 (×3): 3 mL via INTRAVENOUS

## 2014-12-10 MED ORDER — POTASSIUM CHLORIDE CRYS ER 20 MEQ PO TBCR
EXTENDED_RELEASE_TABLET | ORAL | Status: AC
Start: 2014-12-10 — End: 2014-12-10
  Filled 2014-12-10: qty 1

## 2014-12-10 MED ORDER — ASPIRIN EC 81 MG PO TBEC
81.0000 mg | DELAYED_RELEASE_TABLET | Freq: Every day | ORAL | Status: DC
  Administered 2014-12-11: 81 mg via ORAL
  Filled 2014-12-10: qty 1

## 2014-12-10 NOTE — Progress Notes (Addendum)
ANTICOAGULATION CONSULT NOTE - Preliminary  Pharmacy Consult for Enoxaparin Indication:ACS/ NSTEMI/CPS  Allergies  Allergen Reactions  . Coreg [Carvedilol]     Headache     Patient Measurements: Height: 5\' 6"  (167.6 cm) Weight: 190 lb (86.183 kg) IBW/kg (Calculated) : 59.3 HEPARIN DW (KG): 77.7   Vital Signs: Temp: 98.1 F (36.7 C) (06/16 0224) Temp Source: Oral (06/16 0224) BP: 129/98 mmHg (06/16 0330) Pulse Rate: 79 (06/16 0330)  Labs:  Recent Labs  12/09/14 2151 12/10/14 2353  HGB 13.1  --   HCT 38.8  --   PLT 303  --   CREATININE 1.02*  --   TROPONINI <0.03 <0.03   Estimated Creatinine Clearance: 87.6 mL/min (by C-G formula based on Cr of 1.02).  Medical History: Past Medical History  Diagnosis Date  . Hypertension     Medications:  Nitroglycerin sublingual and IV drip Aspirin 325 mg x 1 PO dose Labetalol 20 mg IV x 1 dose  Assessment: 34 yo female with h/o hypertension since age 63 seen in the ED with radiating chest pain. Nonischemic ekg, troponin wnl x 2. Full dose lovenox.  Goal of Therapy:  4 hour heparin level 0.6-1 unit/ml   Plan:  Preliminary review of pertinent patient information completed.  Forestine Na clinical pharmacist will complete review during morning rounds to assess the patient and finalize treatment regimen.  Norberto Sorenson, [facility] 12/10/2014,4:26 AM

## 2014-12-10 NOTE — Progress Notes (Signed)
[facility] PROGRESS NOTE  Maria Lang CLE:[id number] DOB: 07/26/1980 DOA: 12/09/2014 PCP: Celedonio Savage, MD  Assessment/Plan: Chest pain -Very atypical in nature, is over the right chest and right shoulder. No relation to exertion. -Has ruled out for acute coronary syndrome with 3 negative troponins and EKGs that did not demonstrate any acute ischemic abnormalities. -Her pain appears to be more of the right upper quadrant than chest, she also relates some right shoulder pain and has a history of gallstones. Will check liver function tests and a right upper quadrant ultrasound. -Off nitroglycerin drip. -Patient states she had a stress test about 2 years ago which was "normal". No records of this as it was done at [facility].  Hypertension -Long-standing and not well controlled. -Continue amlodipine and hydrochlorothiazide, will add lisinopril.  Chronic diastolic CHF -As per echo: EF of 60-65% with grade 1 diastolic dysfunction. -Compensated.  Code Status: Full code Family Communication: Patient only  Disposition Plan: Transfer to floor, likely home in 24 hours   Consultants:  None   Antibiotics:  None   Subjective: Still complains of right-sided chest pain, no shortness of breath, diaphoresis, dizziness, palpitations.  Objective: Filed Vitals:   12/10/14 1215 12/10/14 1230 12/10/14 1300 12/10/14 1400  BP: 135/88 136/101 139/88 119/70  Pulse: 95 84 93 83  Temp:      TempSrc:      Resp: 14 12 10 12   Height:      Weight:      SpO2: 98% 97% 98% 97%    Intake/Output Summary (Last 24 hours) at 12/10/14 1535 Last data filed at 12/10/14 0700  Gross per 24 hour  Intake  65.46 ml  Output      0 ml  Net  65.46 ml   Filed Weights   12/09/14 2136  Weight: 86.183 kg (190 lb)    Exam:   General:  Alert, awake, oriented 3  Cardiovascular: Regular rate and rhythm, no murmurs, rubs or gallops  Respiratory: Clear to auscultation bilaterally  Abdomen:  Soft, nontender, nondistended, positive bowel sounds  Extremities: No clubbing, cyanosis or edema, positive pulses   Neurologic:  Grossly intact and nonfocal  Data Reviewed: Basic Metabolic Panel:  Recent Labs Lab 12/09/14 2151 12/10/14 0402  NA 138 137  K 3.1* 3.2*  CL 99* 100*  CO2 24 26  GLUCOSE 148* 107*  BUN 11 11  CREATININE 1.02* 0.81  CALCIUM 9.8 9.1   Liver Function Tests:  Recent Labs Lab 12/09/14 2151  AST 18  ALT 19  ALKPHOS 72  BILITOT 0.1*  PROT 8.2*  ALBUMIN 4.8    Recent Labs Lab 12/09/14 2353  LIPASE 16*   No results for input(s): AMMONIA in the last 168 hours. CBC:  Recent Labs Lab 12/09/14 2151 12/10/14 0402  WBC 8.3 8.0  NEUTROABS 5.3  --   HGB 13.1 11.5*  HCT 38.8 35.4*  MCV 83.6 83.9  PLT 303 264   Cardiac Enzymes:  Recent Labs Lab 12/09/14 2151 12/10/14 0402 12/10/14 0937 12/10/14 2353  TROPONINI <0.03 <0.03 <0.03 <0.03   BNP (last 3 results) No results for input(s): BNP in the last 8760 hours.  ProBNP (last 3 results) No results for input(s): PROBNP in the last 8760 hours.  CBG: No results for input(s): GLUCAP in the last 168 hours.  No results found for this or any previous visit (from the past 240 hour(s)).   Studies: Dg Chest 2 View  12/09/2014   CLINICAL DATA:  Constant  worsening right-sided chest pain onset 1 hour ago. Dry mouth and right arm numbness. Nausea.  EXAM: CHEST  2 VIEW  COMPARISON:  08/30/2014  FINDINGS: The heart size and mediastinal contours are within normal limits. Both lungs are clear. The visualized skeletal structures are unremarkable.  IMPRESSION: No active cardiopulmonary disease.   Electronically Signed   By: Lucienne Capers M.D.   On: 12/09/2014 22:21   Ct Angio Chest Pe W/cm &/or Wo Cm  12/10/2014   CLINICAL DATA:  Acute onset of chronically worsening right-sided chest pain and pressure. Right arm numbness and dry mouth. Mild shortness of breath and severe nausea. Initial encounter.   EXAM: CT ANGIOGRAPHY CHEST WITH CONTRAST  TECHNIQUE: Multidetector CT imaging of the chest was performed using the standard protocol during bolus administration of intravenous contrast. Multiplanar CT image reconstructions and MIPs were obtained to evaluate the vascular anatomy.  CONTRAST:  190mL OMNIPAQUE IOHEXOL 350 MG/ML SOLN  COMPARISON:  Chest radiograph performed 12/09/2014  FINDINGS: There is no evidence of pulmonary embolus.  The lungs are clear bilaterally. There is no evidence of significant focal consolidation, pleural effusion or pneumothorax. No masses are identified; no abnormal focal contrast enhancement is seen.  The mediastinum is unremarkable in appearance. No mediastinal lymphadenopathy is seen. No pericardial effusion is identified. The great vessels are grossly unremarkable in appearance. No axillary lymphadenopathy is seen. The visualized portions of the thyroid gland are unremarkable in appearance.  The visualized portions of the liver and spleen are unremarkable.  No acute osseous abnormalities are seen.  Review of the MIP images confirms the above findings.  IMPRESSION: 1. No evidence of pulmonary embolus. 2. Lungs clear bilaterally.   Electronically Signed   By: Garald Balding M.D.   On: 12/10/2014 00:39    Scheduled Meds: . amLODipine  10 mg Oral Daily  . aspirin  324 mg Oral NOW   Or  . aspirin  300 mg Rectal NOW  . [START ON 12/11/2014] aspirin EC  81 mg Oral Daily  . enoxaparin (LOVENOX) injection  1 mg/kg Subcutaneous Q12H  . hydrochlorothiazide  12.5 mg Oral Daily  . lisinopril  10 mg Oral Daily  . [START ON 12/11/2014] pneumococcal 23 valent vaccine  0.5 mL Intramuscular Tomorrow-1000  . sodium chloride  3 mL Intravenous Q12H   Continuous Infusions: . nitroGLYCERIN Stopped (12/10/14 1139)    Principal Problem:   Chest pain Active Problems:   Hypertension   Hypertensive emergency   GERD (gastroesophageal reflux disease)   Chronic diastolic CHF (congestive heart  failure)    Time spent: 35 minutes. Greater than 50% of this time was spent in direct contact with the patient coordinating care.    Lelon Frohlich  [facility] Pager [phone]  If 7PM-7AM, please contact night-coverage at [email or web address], password [facility] 12/10/2014, 3:35 PM  LOS: 0 days

## 2014-12-10 NOTE — H&P (Signed)
PCP:   Celedonio Savage, MD   Chief Complaint:  Chest pain  HPI: 34 yo female h/o htn since age 27, gerd comes in with right sided chest pain that radiates on both sides down right arm since 5pm.  Pain was worse with exertion and says it more of pressure like "something sitting on her chest".  No sob, no cough.  No fevers.  No swelling in legs.  No pleuritic nature to pain.  Not similar to her regular gerd symptoms.  Pain better with  ntg subling in ED, now on ntg gtt but still 4/10 despite morphine, and gi cocktail.  Pain not associated with active or passive movement of right shoulder, and not reproducible with palpation.  Reports having a stress test about 2 years ago which was normal, no prior h/o cath.    Review of Systems:  Positive and negative as per HPI otherwise all other systems are negative  Past Medical History: Past Medical History  Diagnosis Date  . Hypertension    Past Surgical History  Procedure Laterality Date  . Ectopic pregnancy surgery  10/2013    Medications: Prior to Admission medications   Medication Sig Start Date End Date Taking? Authorizing Provider  hydrochlorothiazide (HYDRODIURIL) 12.5 MG tablet Take 12.5 mg by mouth daily.   Yes Historical Provider, MD  amLODipine (NORVASC) 10 MG tablet Take 1 tablet (10 mg total) by mouth daily. 12/05/14   Evalee Jefferson, PA-C  carvedilol (COREG) 12.5 MG tablet Take 12.5 mg by mouth daily.    Historical Provider, MD  clonazePAM (KLONOPIN) 1 MG tablet Take 1 mg by mouth 3 (three) times daily as needed for anxiety.    Historical Provider, MD  oxyCODONE-acetaminophen (PERCOCET/ROXICET) 5-325 MG per tablet Take 1 tablet by mouth every 4 (four) hours as needed. 12/05/14   Evalee Jefferson, PA-C    Allergies:   Allergies  Allergen Reactions  . Coreg [Carvedilol]     Headache     Social History:  reports that she quit smoking about 6 months ago. Her smoking use included Cigarettes. She smoked 0.50 packs per day. She has never used  smokeless tobacco. She reports that she does not drink alcohol or use illicit drugs.  Family History: Family History  Problem Relation Age of Onset  . Hypertension Mother   . Hypertension Father   . Hyperlipidemia Father   . Diabetes Brother   . Diabetes Other     Physical Exam: Filed Vitals:   12/10/14 0159 12/10/14 0200 12/10/14 0224 12/10/14 0330  BP: 124/94 135/96 132/92 129/98  Pulse: 87 86 82 79  Temp:   98.1 F (36.7 C)   TempSrc:   Oral   Resp: 16 10 16 18   Height:      Weight:      SpO2: 99% 98% 97% 99%   General appearance: alert, cooperative and no distress Head: Normocephalic, without obvious abnormality, atraumatic Eyes: negative Nose: Nares normal. Septum midline. Mucosa normal. No drainage or sinus tenderness. Neck: no JVD and supple, symmetrical, trachea midline Lungs: clear to auscultation bilaterally Heart: regular rate and rhythm, S1, S2 normal, no murmur, click, rub or gallop Abdomen: soft, non-tender; bowel sounds normal; no masses,  no organomegaly Extremities: extremities normal, atraumatic, no cyanosis or edema Pulses: 2+ and symmetric Skin: Skin color, texture, turgor normal. No rashes or lesions Neurologic: Grossly normal    Labs on Admission:   Recent Labs  12/09/14 2151  NA 138  K 3.1*  CL 99*  CO2  24  GLUCOSE 148*  BUN 11  CREATININE 1.02*  CALCIUM 9.8    Recent Labs  12/09/14 2151  AST 18  ALT 19  ALKPHOS 72  BILITOT 0.1*  PROT 8.2*  ALBUMIN 4.8    Recent Labs  12/09/14 2353  LIPASE 16*    Recent Labs  12/09/14 2151  WBC 8.3  NEUTROABS 5.3  HGB 13.1  HCT 38.8  MCV 83.6  PLT 303    Recent Labs  12/09/14 2151 12/10/14 2353  TROPONINI <0.03 <0.03   Radiological Exams on Admission: Dg Chest 2 View  12/09/2014   CLINICAL DATA:  Constant worsening right-sided chest pain onset 1 hour ago. Dry mouth and right arm numbness. Nausea.  EXAM: CHEST  2 VIEW  COMPARISON:  08/30/2014  FINDINGS: The heart size and  mediastinal contours are within normal limits. Both lungs are clear. The visualized skeletal structures are unremarkable.  IMPRESSION: No active cardiopulmonary disease.   Electronically Signed   By: Lucienne Capers M.D.   On: 12/09/2014 22:21   Ct Angio Chest Pe W/cm &/or Wo Cm  12/10/2014   CLINICAL DATA:  Acute onset of chronically worsening right-sided chest pain and pressure. Right arm numbness and dry mouth. Mild shortness of breath and severe nausea. Initial encounter.  EXAM: CT ANGIOGRAPHY CHEST WITH CONTRAST  TECHNIQUE: Multidetector CT imaging of the chest was performed using the standard protocol during bolus administration of intravenous contrast. Multiplanar CT image reconstructions and MIPs were obtained to evaluate the vascular anatomy.  CONTRAST:  152mL OMNIPAQUE IOHEXOL 350 MG/ML SOLN  COMPARISON:  Chest radiograph performed 12/09/2014  FINDINGS: There is no evidence of pulmonary embolus.  The lungs are clear bilaterally. There is no evidence of significant focal consolidation, pleural effusion or pneumothorax. No masses are identified; no abnormal focal contrast enhancement is seen.  The mediastinum is unremarkable in appearance. No mediastinal lymphadenopathy is seen. No pericardial effusion is identified. The great vessels are grossly unremarkable in appearance. No axillary lymphadenopathy is seen. The visualized portions of the thyroid gland are unremarkable in appearance.  The visualized portions of the liver and spleen are unremarkable.  No acute osseous abnormalities are seen.  Review of the MIP images confirms the above findings.  IMPRESSION: 1. No evidence of pulmonary embolus. 2. Lungs clear bilaterally.   Electronically Signed   By: Garald Balding M.D.   On: 12/10/2014 00:39   Old records reviewed Case discussed with dr Delton See reviewed  Assessment/Plan  34 yo female h/o htn with chest pain  Principal Problem:   Chest pain-  Nonischemic ekg and 2 normal troponins.  Cont  ntg gtt and lovenox full dosing until completely rules out.  Obtain echo in am and cardiology consult for further recommendations.  Aspirin.  bp better with ntg gtt.  Active Problems:   Hypertension   Hypertensive emergency-  bp better with ntg gtt,    GERD (gastroesophageal reflux disease)- gi cocktail may have helped some.  Hard for her to tell as she has been given so much in the ED.  obs in stepdown.  Full code.  Eula Jaster A 12/10/2014, 3:47 AM

## 2014-12-10 NOTE — ED Provider Notes (Signed)
Patient initially seen and evaluated by Dr. Wilson Singer. She presented with right-sided chest pain and hypertension and partial relief with nitroglycerin. She has been given several doses of morphine and continues to have pain. Troponin has been negative 2. Initial ECG showed nonspecific ST changes but repeat ECGs have not shown any evolution of changes. CT angiogram of the chest showed no evidence of pulmonary embolism. I discussed case with Dr. Claiborne Billings cardiology service who reviewed the ECGs and did not feel she needed emergent transfer her to [facility]. Case was discussed with Dr. Shanon Brow of [facility] who requested lipase be obtained which has been ordered. She is currently on a nitroglycerin drip on both blood pressure continues to be elevated and she was given a dose of labetalol. She is admitted to stepdown unit.   EKG Interpretation  Date/Time:  Thursday December 10 2014 01:35:27 EDT Ventricular Rate:  95 PR Interval:  174 QRS Duration: 84 QT Interval:  403 QTC Calculation: 507 R Axis:   74 Text Interpretation:  Sinus rhythm Probable inferior infarct, age indeterminate Prolonged QT interval No significant change since last tracing Confirmed by [facility]  MD, Jennessa Trigo ([id number]) on 12/10/2014 2:00:58 AM      Results for orders placed or performed during the hospital encounter of 12/09/14  CBC with Differential  Result Value Ref Range   WBC 8.3 4.0 - 10.5 K/uL   RBC 4.64 3.87 - 5.11 MIL/uL   Hemoglobin 13.1 12.0 - 15.0 g/dL   HCT 38.8 36.0 - 46.0 %   MCV 83.6 78.0 - 100.0 fL   MCH 28.2 26.0 - 34.0 pg   MCHC 33.8 30.0 - 36.0 g/dL   RDW 14.3 11.5 - 15.5 %   Platelets 303 150 - 400 K/uL   Neutrophils Relative % 65 43 - 77 %   Neutro Abs 5.3 1.7 - 7.7 K/uL   Lymphocytes Relative 30 12 - 46 %   Lymphs Abs 2.5 0.7 - 4.0 K/uL   Monocytes Relative 5 3 - 12 %   Monocytes Absolute 0.4 0.1 - 1.0 K/uL   Eosinophils Relative 0 0 - 5 %   Eosinophils Absolute 0.0 0.0 - 0.7 K/uL   Basophils  Relative 0 0 - 1 %   Basophils Absolute 0.0 0.0 - 0.1 K/uL  Comprehensive metabolic panel  Result Value Ref Range   Sodium 138 135 - 145 mmol/L   Potassium 3.1 (L) 3.5 - 5.1 mmol/L   Chloride 99 (L) 101 - 111 mmol/L   CO2 24 22 - 32 mmol/L   Glucose, Bld 148 (H) 65 - 99 mg/dL   BUN 11 6 - 20 mg/dL   Creatinine, Ser 1.02 (H) 0.44 - 1.00 mg/dL   Calcium 9.8 8.9 - 10.3 mg/dL   Total Protein 8.2 (H) 6.5 - 8.1 g/dL   Albumin 4.8 3.5 - 5.0 g/dL   AST 18 15 - 41 U/L   ALT 19 14 - 54 U/L   Alkaline Phosphatase 72 38 - 126 U/L   Total Bilirubin 0.1 (L) 0.3 - 1.2 mg/dL   GFR calc non Af Amer >60 >60 mL/min   GFR calc Af Amer >60 >60 mL/min   Anion gap 15 5 - 15  Troponin I  Result Value Ref Range   Troponin I <0.03 <0.031 ng/mL  Urine rapid drug screen (hosp performed)  Result Value Ref Range   Opiates POSITIVE (A) NONE DETECTED   Cocaine NONE DETECTED NONE DETECTED   Benzodiazepines NONE DETECTED NONE  DETECTED   Amphetamines NONE DETECTED NONE DETECTED   Tetrahydrocannabinol NONE DETECTED NONE DETECTED   Barbiturates NONE DETECTED NONE DETECTED  Pregnancy, urine  Result Value Ref Range   Preg Test, Ur NEGATIVE NEGATIVE  Troponin I  Result Value Ref Range   Troponin I <0.03 <0.031 ng/mL  Lipase, blood  Result Value Ref Range   Lipase 16 (L) 22 - 51 U/L   Dg Chest 2 View  12/09/2014   CLINICAL DATA:  Constant worsening right-sided chest pain onset 1 hour ago. Dry mouth and right arm numbness. Nausea.  EXAM: CHEST  2 VIEW  COMPARISON:  08/30/2014  FINDINGS: The heart size and mediastinal contours are within normal limits. Both lungs are clear. The visualized skeletal structures are unremarkable.  IMPRESSION: No active cardiopulmonary disease.   Electronically Signed   By: Lucienne Capers M.D.   On: 12/09/2014 22:21   Ct Angio Chest Pe W/cm &/or Wo Cm  12/10/2014   CLINICAL DATA:  Acute onset of chronically worsening right-sided chest pain and pressure. Right arm numbness and dry  mouth. Mild shortness of breath and severe nausea. Initial encounter.  EXAM: CT ANGIOGRAPHY CHEST WITH CONTRAST  TECHNIQUE: Multidetector CT imaging of the chest was performed using the standard protocol during bolus administration of intravenous contrast. Multiplanar CT image reconstructions and MIPs were obtained to evaluate the vascular anatomy.  CONTRAST:  125mL OMNIPAQUE IOHEXOL 350 MG/ML SOLN  COMPARISON:  Chest radiograph performed 12/09/2014  FINDINGS: There is no evidence of pulmonary embolus.  The lungs are clear bilaterally. There is no evidence of significant focal consolidation, pleural effusion or pneumothorax. No masses are identified; no abnormal focal contrast enhancement is seen.  The mediastinum is unremarkable in appearance. No mediastinal lymphadenopathy is seen. No pericardial effusion is identified. The great vessels are grossly unremarkable in appearance. No axillary lymphadenopathy is seen. The visualized portions of the thyroid gland are unremarkable in appearance.  The visualized portions of the liver and spleen are unremarkable.  No acute osseous abnormalities are seen.  Review of the MIP images confirms the above findings.  IMPRESSION: 1. No evidence of pulmonary embolus. 2. Lungs clear bilaterally.   Electronically Signed   By: Garald Balding M.D.   On: 12/10/2014 00:39   Ct Orbits W/cm  12/05/2014   CLINICAL DATA:  Acute onset of right orbital pain this morning.  EXAM: CT ORBITS WITH CONTRAST  TECHNIQUE: Multidetector CT imaging of the orbits was performed following the bolus administration of intravenous contrast.  CONTRAST:  4mL OMNIPAQUE IOHEXOL 300 MG/ML  SOLN  COMPARISON:  Head CT 08/30/2014  FINDINGS: Both globes appear normal. The optic nerves appear symmetric and normal. Extra-ocular muscles appear normal. Lacrimal glands are normal. No abnormal vascular structures. Orbital fat appear symmetric and normal. No abnormality seen in either orbital apex or in either cavernous  sinus region. Visualized intracranial structures appear normal. No evidence of inflammatory sinus disease. There are a few insignificant retention cysts. Other regional soft tissues appear normal.  IMPRESSION: Normal exam.  No lesions seen to explain right orbital pain.   Electronically Signed   By: Nelson Chimes M.D.   On: 12/05/2014 15:54   Images viewed by me.   Delora Fuel, MD 37/34/28 [id number]

## 2014-12-10 NOTE — ED Notes (Signed)
Dr Glick at bedside,  

## 2014-12-11 ENCOUNTER — Inpatient Hospital Stay (HOSPITAL_COMMUNITY): Payer: Medicaid Other

## 2014-12-11 DIAGNOSIS — I5032 Chronic diastolic (congestive) heart failure: Secondary | ICD-10-CM

## 2014-12-11 DIAGNOSIS — I10 Essential (primary) hypertension: Secondary | ICD-10-CM

## 2014-12-11 LAB — COMPREHENSIVE METABOLIC PANEL
ALBUMIN: 4.1 g/dL (ref 3.5–5.0)
ALT: 16 U/L (ref 14–54)
ANION GAP: 10 (ref 5–15)
AST: 13 U/L — ABNORMAL LOW (ref 15–41)
Alkaline Phosphatase: 60 U/L (ref 38–126)
BUN: 19 mg/dL (ref 6–20)
CALCIUM: 9.2 mg/dL (ref 8.9–10.3)
CO2: 28 mmol/L (ref 22–32)
CREATININE: 1.03 mg/dL — AB (ref 0.44–1.00)
Chloride: 99 mmol/L — ABNORMAL LOW (ref 101–111)
GFR calc non Af Amer: >60 mL/min (ref >60)
Glucose, Bld: 100 mg/dL — ABNORMAL HIGH (ref 65–99)
Potassium: 3.6 mmol/L (ref 3.5–5.1)
Sodium: 137 mmol/L (ref 135–145)
TOTAL PROTEIN: 7.4 g/dL (ref 6.5–8.1)
Total Bilirubin: 0.6 mg/dL (ref 0.3–1.2)

## 2014-12-11 IMAGING — US US ABDOMEN LIMITED
1 series · 14 of 25 positions shown · non-contrast
Comparison: Abdominal CT [DATE].

CLINICAL DATA: Right upper quadrant abdominal and chest pain for 3
days. History of hypertension and congestive heart failure. History
of gallstones. Initial encounter.

EXAM:
US ABDOMEN LIMITED - RIGHT UPPER QUADRANT

[Series 1: us abdomen limited · 0.21mm/px · 14 of 75 slices shown]
[im 1/75]
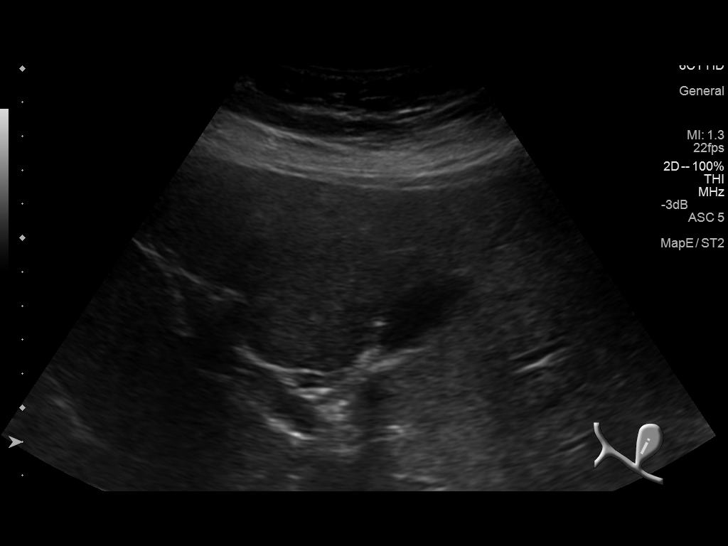
[im 7/75]
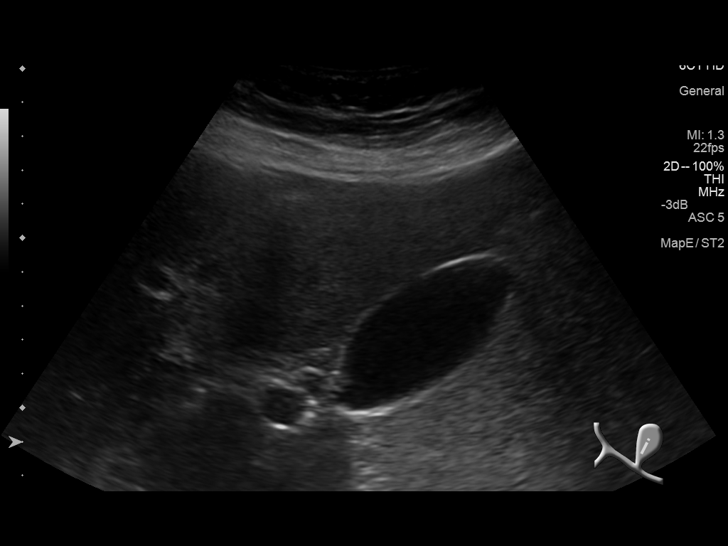
[im 13/75]
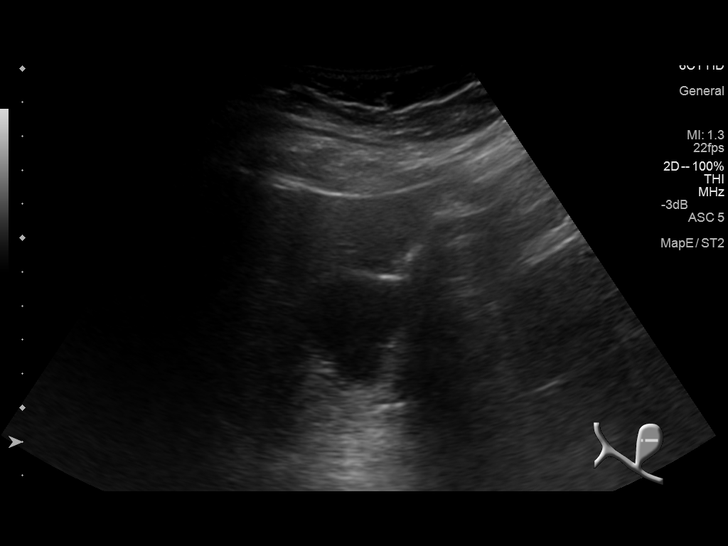
[im 19/75]
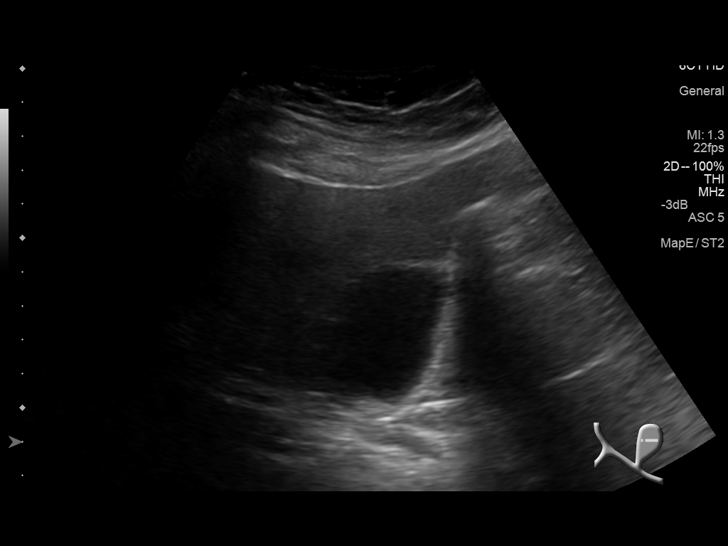
[im 25/75]
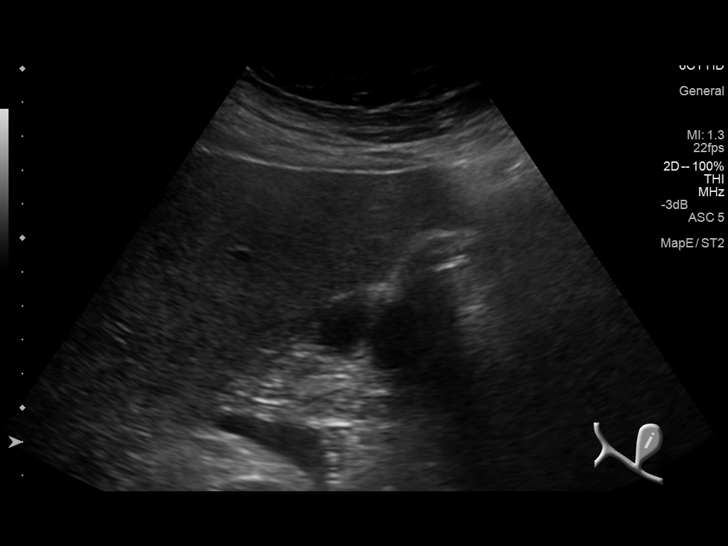
[im 28/75]
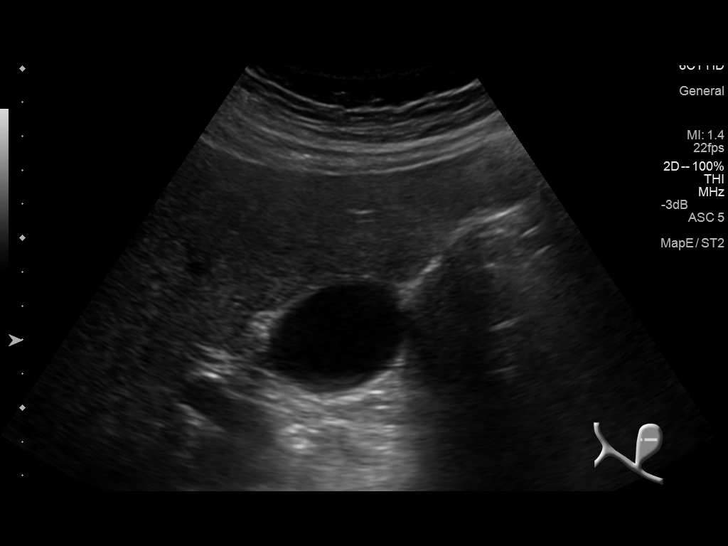
[im 34/75]
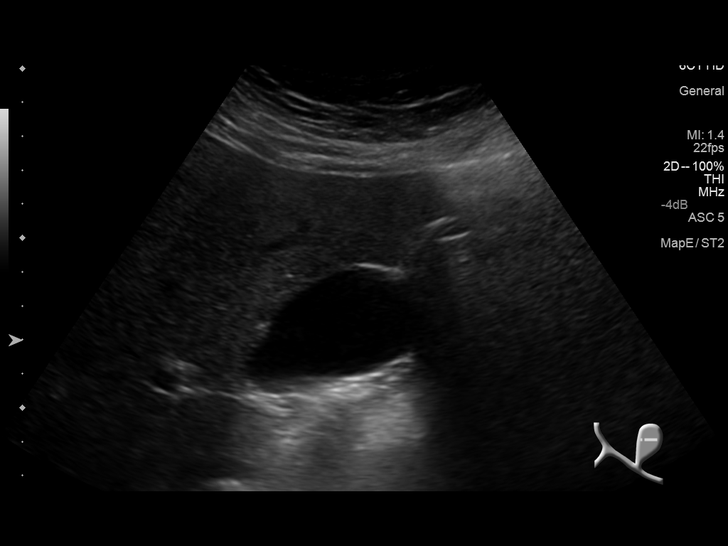
[im 41/75]
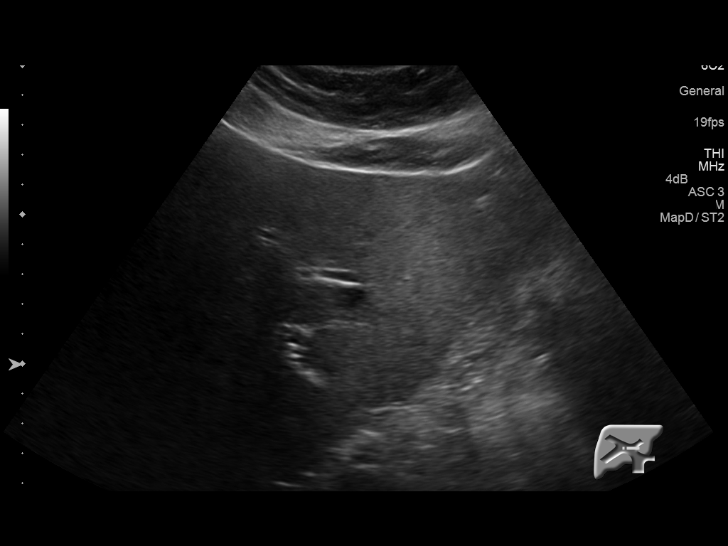
[im 47/75]
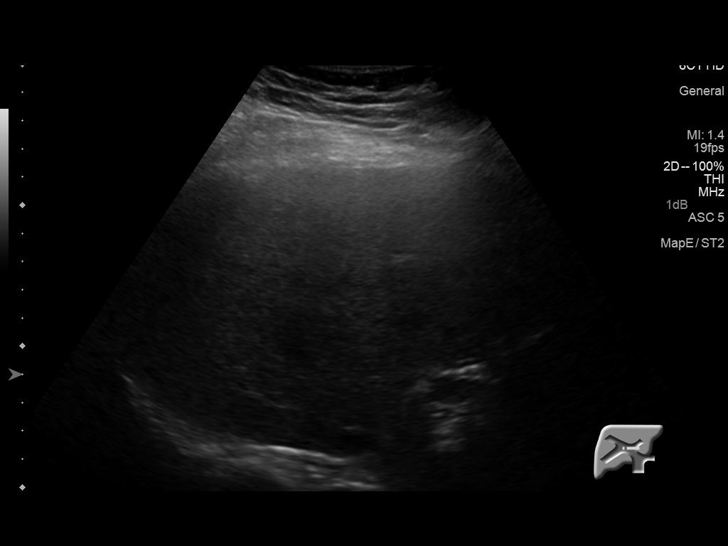
[im 50/75]
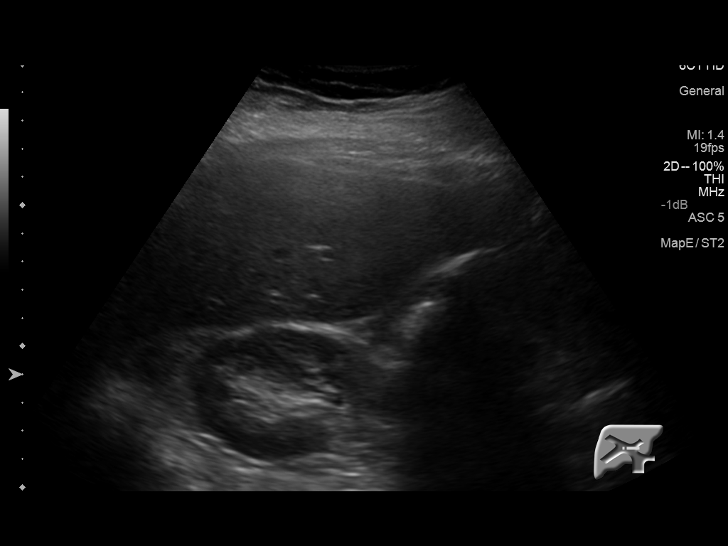
[im 56/75]
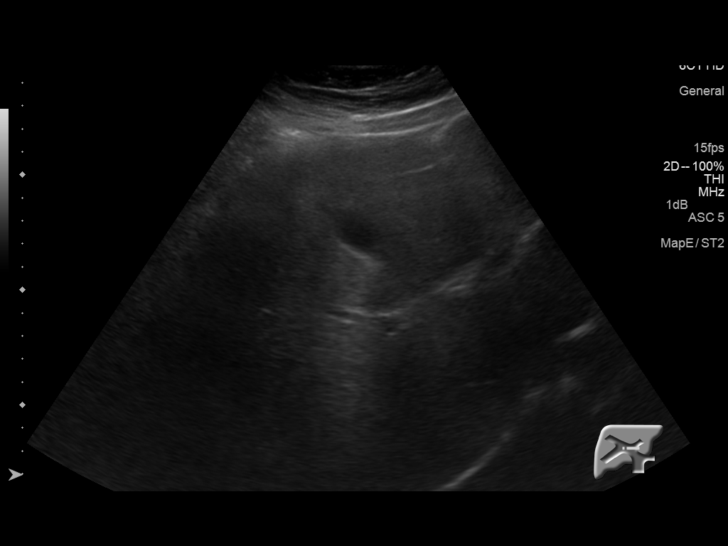
[im 62/75]
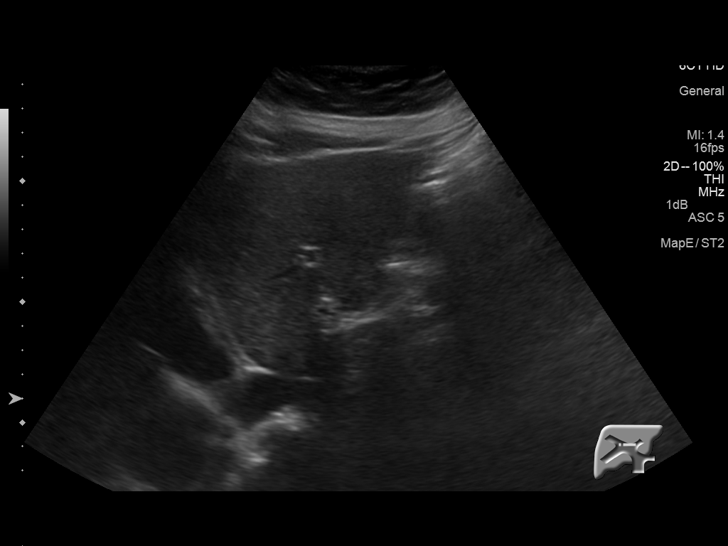
[im 68/75]
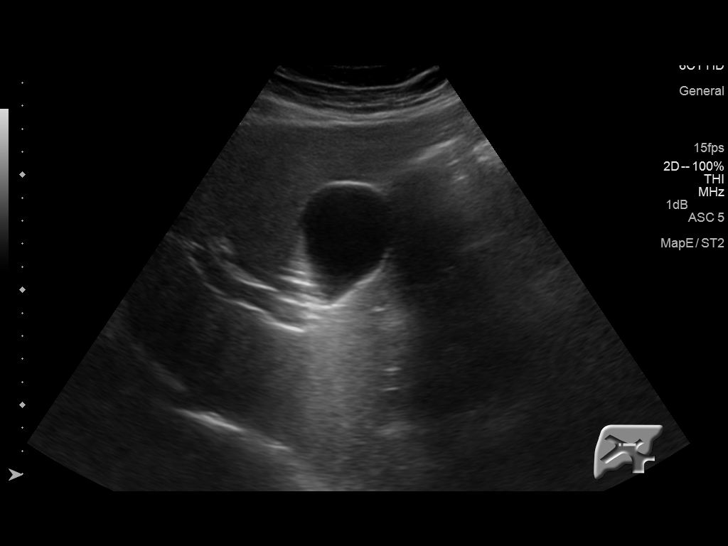
[im 75/75]
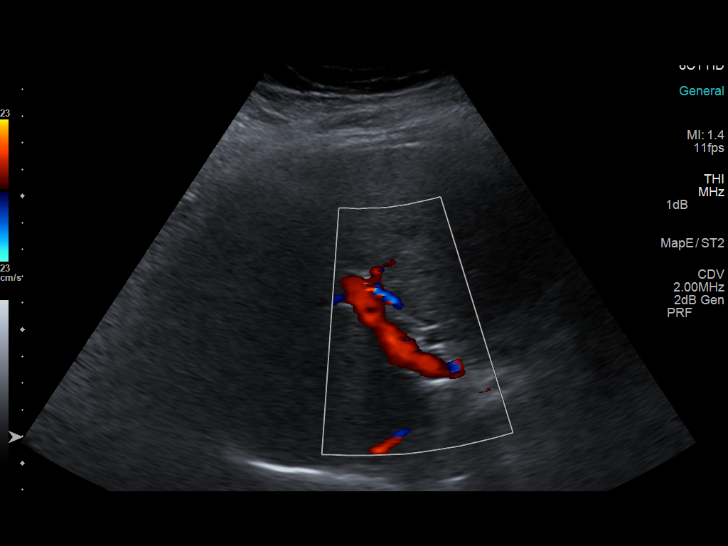

[14 of 25 positions shown; findings below may reference images not displayed]

FINDINGS: Gallbladder:

No gallstones or wall thickening visualized. No sonographic Murphy
sign noted.

Common bile duct:

Diameter: 6.5 mm

Liver:

Mildly increased echogenicity without focal abnormality.
IMPRESSION: No acute right upper quadrant findings or evidence of
cholelithiasis. Mildly increased hepatic echogenicity, likely
secondary to steatosis.

## 2014-12-11 MED ORDER — LISINOPRIL 10 MG PO TABS: 10.0000 mg | ORAL_TABLET | Freq: Every day | ORAL | Status: DC

## 2014-12-11 MED ORDER — HYDROCHLOROTHIAZIDE 12.5 MG PO TABS: 25.0000 mg | ORAL_TABLET | Freq: Every day | ORAL | Status: DC

## 2014-12-11 MED ORDER — ASPIRIN 81 MG PO TBEC: 81.0000 mg | DELAYED_RELEASE_TABLET | Freq: Every day | ORAL | Status: DC

## 2014-12-11 NOTE — Care Management Note (Signed)
Case Management Note  Patient Details  Name: Maria Lang MRN: [id number] Date of Birth: August 13, 1980  Subjective/Objective:                  Pt admitted from home with CP. Pt lives with her family and will return home at discharge. Pt is independent with ADL's.  Action/Plan: No CM needs noted. Anticipate discharge within 48 hours.  Expected Discharge Date:                  Expected Discharge Plan:  Home/Self Care  In-House Referral:  NA  Discharge planning Services  CM Consult  Post Acute Care Choice:  NA Choice offered to:  NA  DME Arranged:    DME Agency:     HH Arranged:    HH Agency:     Status of Service:  Completed, signed off  Medicare Important Message Given:    Date Medicare IM Given:    Medicare IM give by:    Date Additional Medicare IM Given:    Additional Medicare Important Message give by:     If discussed at [place] of Stay Meetings, dates discussed:    Additional Comments:  Joylene Draft, RN 12/11/2014, 8:23 AM

## 2014-12-11 NOTE — Discharge Summary (Signed)
Physician Discharge Summary  Maria Lang YTK:[id number] DOB: 1981/04/06 DOA: 12/09/2014  PCP: Celedonio Savage, MD  Admit date: 12/09/2014 Discharge date: 12/11/2014  Time spent: 45 minutes  Recommendations for Outpatient Follow-up:  -Will be discharged home today. -Advised to follow-up with primary care provider in 2 weeks for continued blood pressure management.   Discharge Diagnoses:  Principal Problem:   Chest pain Active Problems:   Hypertension   Hypertensive emergency   GERD (gastroesophageal reflux disease)   Chronic diastolic CHF (congestive heart failure)   Discharge Condition: Stable and improved  Filed Weights   12/09/14 2136  Weight: 86.183 kg (190 lb)    History of present illness:  34 yo female h/o htn since age 43, gerd comes in with right sided chest pain that radiates on both sides down right arm since 5pm. Pain was worse with exertion and says it more of pressure like "something sitting on her chest". No sob, no cough. No fevers. No swelling in legs. No pleuritic nature to pain. Not similar to her regular gerd symptoms. Pain better with ntg subling in ED, now on ntg gtt but still 4/10 despite morphine, and gi cocktail. Pain not associated with active or passive movement of right shoulder, and not reproducible with palpation. Reports having a stress test about 2 years ago which was normal, no prior h/o cath.   Hospital Course:   Chest pain -Ruled out for acute coronary syndrome with 3 sets of negative troponins and EKG without acute ischemic abnormalities. -Chest pain was very atypical in nature. -She had a stress test 2 years ago which per patient was reported as "normal". I have no records of this stress test at time of discharge. -Believe no further workup is warranted at this time. -As her pain was more in the right side of the chest and right shoulder and she stated she had a history of gallstones, I went ahead and ordered an ultrasound of  the right upper quadrant which do not find evidence for cholelithiasis, there was mildly increased hepatic echogenicity likely secondary to steatosis.  Hypertension -Not well controlled. -Is on amlodipine and Hydrochlorothiazide. -Have increased dose of hydrochlorothiazide to 25 mg and have added 10 mg of lisinopril. -Advise close follow-up with her primary care provider in 2 weeks for continued blood pressure management.  Chronic diastolic CHF -Echo shows an EF of 60-65% with grade 1 diastolic dysfunction. -Currently compensated, no signs of volume overload.  Procedures:  None   Consultations:  None  Discharge Instructions  Discharge Instructions    Diet - low sodium heart healthy    Complete by:  As directed      Increase activity slowly    Complete by:  As directed             Medication List    TAKE these medications        amLODipine 10 MG tablet  Commonly known as:  NORVASC  Take 1 tablet (10 mg total) by mouth daily.     aspirin 81 MG EC tablet  Take 1 tablet (81 mg total) by mouth daily.     clonazePAM 1 MG tablet  Commonly known as:  KLONOPIN  Take 1 mg by mouth 3 (three) times daily as needed for anxiety.     homatropine 5 % ophthalmic solution  Place 1 drop into the right eye 3 (three) times daily.     hydrochlorothiazide 12.5 MG tablet  Commonly known as:  HYDRODIURIL  Take  2 tablets (25 mg total) by mouth daily.     ketorolac 0.5 % ophthalmic solution  Commonly known as:  ACULAR  Place 1 drop into the right eye 3 (three) times daily.     lisinopril 10 MG tablet  Commonly known as:  PRINIVIL,ZESTRIL  Take 1 tablet (10 mg total) by mouth daily.     trifluridine 1 % ophthalmic solution  Commonly known as:  VIROPTIC  Place 1 drop into the right eye as directed.       Allergies  Allergen Reactions  . Coreg [Carvedilol]     Headache        Follow-up Information    Follow up with Celedonio Savage, MD. Schedule an appointment as soon as  possible for a visit in 2 weeks.   Specialty:  Family Medicine   Contact information:   [address] [place] [address] [phone]        The results of significant diagnostics from this hospitalization (including imaging, microbiology, ancillary and laboratory) are listed below for reference.    Significant Diagnostic Studies: Dg Chest 2 View  12/09/2014   CLINICAL DATA:  Constant worsening right-sided chest pain onset 1 hour ago. Dry mouth and right arm numbness. Nausea.  EXAM: CHEST  2 VIEW  COMPARISON:  08/30/2014  FINDINGS: The heart size and mediastinal contours are within normal limits. Both lungs are clear. The visualized skeletal structures are unremarkable.  IMPRESSION: No active cardiopulmonary disease.   Electronically Signed   By: Lucienne Capers M.D.   On: 12/09/2014 22:21   Ct Angio Chest Pe W/cm &/or Wo Cm  12/10/2014   CLINICAL DATA:  Acute onset of chronically worsening right-sided chest pain and pressure. Right arm numbness and dry mouth. Mild shortness of breath and severe nausea. Initial encounter.  EXAM: CT ANGIOGRAPHY CHEST WITH CONTRAST  TECHNIQUE: Multidetector CT imaging of the chest was performed using the standard protocol during bolus administration of intravenous contrast. Multiplanar CT image reconstructions and MIPs were obtained to evaluate the vascular anatomy.  CONTRAST:  148mL OMNIPAQUE IOHEXOL 350 MG/ML SOLN  COMPARISON:  Chest radiograph performed 12/09/2014  FINDINGS: There is no evidence of pulmonary embolus.  The lungs are clear bilaterally. There is no evidence of significant focal consolidation, pleural effusion or pneumothorax. No masses are identified; no abnormal focal contrast enhancement is seen.  The mediastinum is unremarkable in appearance. No mediastinal lymphadenopathy is seen. No pericardial effusion is identified. The great vessels are grossly unremarkable in appearance. No axillary lymphadenopathy is seen. The visualized portions of  the thyroid gland are unremarkable in appearance.  The visualized portions of the liver and spleen are unremarkable.  No acute osseous abnormalities are seen.  Review of the MIP images confirms the above findings.  IMPRESSION: 1. No evidence of pulmonary embolus. 2. Lungs clear bilaterally.   Electronically Signed   By: Garald Balding M.D.   On: 12/10/2014 00:39   Ct Orbits W/cm  12/05/2014   CLINICAL DATA:  Acute onset of right orbital pain this morning.  EXAM: CT ORBITS WITH CONTRAST  TECHNIQUE: Multidetector CT imaging of the orbits was performed following the bolus administration of intravenous contrast.  CONTRAST:  43mL OMNIPAQUE IOHEXOL 300 MG/ML  SOLN  COMPARISON:  Head CT 08/30/2014  FINDINGS: Both globes appear normal. The optic nerves appear symmetric and normal. Extra-ocular muscles appear normal. Lacrimal glands are normal. No abnormal vascular structures. Orbital fat appear symmetric and normal. No abnormality seen in either orbital apex  or in either cavernous sinus region. Visualized intracranial structures appear normal. No evidence of inflammatory sinus disease. There are a few insignificant retention cysts. Other regional soft tissues appear normal.  IMPRESSION: Normal exam.  No lesions seen to explain right orbital pain.   Electronically Signed   By: Nelson Chimes M.D.   On: 12/05/2014 15:54   US Abdomen Limited Ruq  12/11/2014   CLINICAL DATA:  Right upper quadrant abdominal and chest pain for 3 days. History of hypertension and congestive heart failure. History of gallstones. Initial encounter.  EXAM: US ABDOMEN LIMITED - RIGHT UPPER QUADRANT  COMPARISON:  Abdominal CT 05/26/2012.  FINDINGS: Gallbladder:  No gallstones or wall thickening visualized. No sonographic Murphy sign noted.  Common bile duct:  Diameter: 6.5 mm  Liver:  Mildly increased echogenicity without focal abnormality.  IMPRESSION: No acute right upper quadrant findings or evidence of cholelithiasis. Mildly increased hepatic  echogenicity, likely secondary to steatosis.   Electronically Signed   By: Richardean Sale M.D.   On: 12/11/2014 08:26    Microbiology: Recent Results (from the past 240 hour(s))  MRSA PCR Screening     Status: None   Collection Time: 12/10/14  3:13 AM  Result Value Ref Range Status   MRSA by PCR NEGATIVE NEGATIVE Final    Comment:        The GeneXpert MRSA Assay (FDA approved for NASAL specimens only), is one component of a comprehensive MRSA colonization surveillance program. It is not intended to diagnose MRSA infection nor to guide or monitor treatment for MRSA infections.      Labs: Basic Metabolic Panel:  Recent Labs Lab 12/09/14 2151 12/10/14 0402 12/11/14 0606  NA 138 137 137  K 3.1* 3.2* 3.6  CL 99* 100* 99*  CO2 24 26 28   GLUCOSE 148* 107* 100*  BUN 11 11 19   CREATININE 1.02* 0.81 1.03*  CALCIUM 9.8 9.1 9.2   Liver Function Tests:  Recent Labs Lab 12/09/14 2151 12/11/14 0606  AST 18 13*  ALT 19 16  ALKPHOS 72 60  BILITOT 0.1* 0.6  PROT 8.2* 7.4  ALBUMIN 4.8 4.1    Recent Labs Lab 12/09/14 2353  LIPASE 16*   No results for input(s): AMMONIA in the last 168 hours. CBC:  Recent Labs Lab 12/09/14 2151 12/10/14 0402  WBC 8.3 8.0  NEUTROABS 5.3  --   HGB 13.1 11.5*  HCT 38.8 35.4*  MCV 83.6 83.9  PLT 303 264   Cardiac Enzymes:  Recent Labs Lab 12/09/14 2151 12/10/14 0402 12/10/14 0937 12/10/14 1534 12/10/14 2353  TROPONINI <0.03 <0.03 <0.03 <0.03 <0.03   BNP: BNP (last 3 results) No results for input(s): BNP in the last 8760 hours.  ProBNP (last 3 results) No results for input(s): PROBNP in the last 8760 hours.  CBG: No results for input(s): GLUCAP in the last 168 hours.     SignedLelon Frohlich  [facility] Pager: [phone] 12/11/2014, 12:43 PM

## 2014-12-11 NOTE — Progress Notes (Signed)
Pt discharged home today per Dr. Hernandez. Pt's IV site D/C'd and WDL. Pt's VSS. Pt provided with home medication list, discharge instructions and prescriptions. Verbalized understanding. Pt left floor via WC in stable condition accompanied by NT. 

## 2014-12-11 NOTE — Progress Notes (Signed)
ANTICOAGULATION CONSULT NOTE - follow up  Pharmacy Consult for Enoxaparin Indication:ACS/ NSTEMI/CPS  Allergies  Allergen Reactions  . Coreg [Carvedilol]     Headache    Patient Measurements: Height: 5\' 6"  (167.6 cm) Weight: 190 lb (86.183 kg) IBW/kg (Calculated) : 59.3 HEPARIN DW (KG): 77.7  Vital Signs: Temp: 98 F (36.7 C) (06/17 0603) Temp Source: Oral (06/17 0603) BP: 92/60 mmHg (06/17 0603) Pulse Rate: 77 (06/17 0603)  Labs:  Recent Labs  12/09/14 2151 12/10/14 0402 12/10/14 0937 12/10/14 1534 12/10/14 2353 12/11/14 0606  HGB 13.1 11.5*  --   --   --   --   HCT 38.8 35.4*  --   --   --   --   PLT 303 264  --   --   --   --   LABPROT  --  14.7  --   --   --   --   INR  --  1.13  --   --   --   --   CREATININE 1.02* 0.81  --   --   --  1.03*  TROPONINI <0.03 <0.03 <0.03 <0.03 <0.03  --    Estimated Creatinine Clearance: 86.8 mL/min (by C-G formula based on Cr of 1.03).  Medical History: Past Medical History  Diagnosis Date  . Hypertension    Assessment: 34 yo female with h/o hypertension since age 70 seen in the ED with radiating chest pain. Nonischemic ekg, troponin wnl x 2. Full dose lovenox.  Pt has good renal fxn.  Goal of Therapy:  4 hour heparin level 0.6-1 unit/ml   Plan:  Continue Lovenox 1mg /Kg (85mg ) SQ q12hrs Monitor labs, CBC F/U plan per MD  Hart Robinsons A, RPH 12/11/2014,8:07 AM

## 2014-12-25 HISTORY — PX: BILATERAL SALPINGECTOMY: SHX5743

## 2015-05-26 ENCOUNTER — Other Ambulatory Visit: Payer: Self-pay | Admitting: Otolaryngology

## 2015-05-31 ENCOUNTER — Encounter (HOSPITAL_COMMUNITY): Payer: Self-pay | Admitting: Emergency Medicine

## 2015-05-31 ENCOUNTER — Emergency Department (HOSPITAL_COMMUNITY)
Admission: EM | Admit: 2015-05-31 | Discharge: 2015-06-01 | Disposition: A | Discharge status: Home / Self Care | Payer: 59 | Attending: Emergency Medicine | Admitting: Emergency Medicine

## 2015-05-31 DIAGNOSIS — Z791 Long term (current) use of non-steroidal anti-inflammatories (NSAID): Secondary | ICD-10-CM | POA: Diagnosis not present

## 2015-05-31 DIAGNOSIS — H538 Other visual disturbances: Secondary | ICD-10-CM | POA: Insufficient documentation

## 2015-05-31 DIAGNOSIS — Z7982 Long term (current) use of aspirin: Secondary | ICD-10-CM | POA: Diagnosis not present

## 2015-05-31 DIAGNOSIS — R197 Diarrhea, unspecified: Secondary | ICD-10-CM | POA: Diagnosis not present

## 2015-05-31 DIAGNOSIS — I10 Essential (primary) hypertension: Secondary | ICD-10-CM | POA: Insufficient documentation

## 2015-05-31 DIAGNOSIS — Z79899 Other long term (current) drug therapy: Secondary | ICD-10-CM | POA: Insufficient documentation

## 2015-05-31 DIAGNOSIS — E860 Dehydration: Secondary | ICD-10-CM | POA: Insufficient documentation

## 2015-05-31 DIAGNOSIS — R509 Fever, unspecified: Secondary | ICD-10-CM | POA: Insufficient documentation

## 2015-05-31 DIAGNOSIS — R531 Weakness: Secondary | ICD-10-CM

## 2015-05-31 LAB — CBC WITH DIFFERENTIAL/PLATELET
Basophils Absolute: 0 10*3/uL (ref 0.0–0.1)
Basophils Relative: 0 %
EOS ABS: 0 10*3/uL (ref 0.0–0.7)
EOS PCT: 0 %
HCT: 36.4 % (ref 36.0–46.0)
Hemoglobin: 11.8 g/dL — ABNORMAL LOW (ref 12.0–15.0)
LYMPHS PCT: 30 %
Lymphs Abs: 2.1 10*3/uL (ref 0.7–4.0)
MCH: 27.6 pg (ref 26.0–34.0)
MCHC: 32.4 g/dL (ref 30.0–36.0)
MCV: 85 fL (ref 78.0–100.0)
MONO ABS: 0.5 10*3/uL (ref 0.1–1.0)
Monocytes Relative: 7 %
Neutro Abs: 4.4 10*3/uL (ref 1.7–7.7)
Neutrophils Relative %: 63 %
Platelets: 292 10*3/uL (ref 150–400)
RBC: 4.28 MIL/uL (ref 3.87–5.11)
RDW: 14.1 % (ref 11.5–15.5)
WBC: 7 10*3/uL (ref 4.0–10.5)

## 2015-05-31 MED ORDER — SODIUM CHLORIDE 0.9 % IV SOLN
1000.0000 mL | Freq: Once | INTRAVENOUS | Status: AC
  Administered 2015-05-31: 1000 mL via INTRAVENOUS

## 2015-05-31 MED ORDER — SODIUM CHLORIDE 0.9 % IV SOLN: 1000.0000 mL | INTRAVENOUS | Status: DC

## 2015-05-31 MED ORDER — SODIUM CHLORIDE 0.9 % IV SOLN
1000.0000 mL | Freq: Once | INTRAVENOUS | Status: AC
  Administered 2015-06-01: 1000 mL via INTRAVENOUS

## 2015-05-31 NOTE — ED Provider Notes (Signed)
CSN: [id number]     Arrival date & time 05/31/15  2250 History  By signing my name below, I, Hansel Feinstein, attest that this documentation has been prepared under the direction and in the presence of Rolland Porter, MD at 2302. Electronically Signed: Hansel Feinstein, ED Scribe. 05/31/2015. 11:31 PM.    Chief Complaint  Patient presents with  . Fatigue   The history is provided by the patient. No language interpreter was used.    HPI Comments: Maria Lang is a 34 y.o. female with h/o HTN who presents to the Emergency Department complaining of generalized weakness, lightheadedness, dizziness, vision changes, nausea, one episode of diarrhea today, low grade fever of 99.6 onset yesterday. Pt is s/p deviated septum repair, tympanostomy, tonsillectomy by Dr. Simeon Craft at the [facility] 5 days ago. Husband states that the pt has not been bleeding profusely and has had the same nasal packing for 5 days. Pt states that she has had difficulty ambulating secondary to generalized weakness and feeling like she will pass out after mild to moderate activity for the last few days. Pt states she has eaten a small amount but has had been consuming adequate fluids since her tonsillectomy. She notes that she has blurry vision since the surgery even with her contact lenses in.  She was prescribed ear drops, nausea medication, an antibiotic and oxycodone after the surgery. She has not finished her course of antibiotics. Pt has a follow up appointment with her surgeon tomorrow to get her nasal packing removed. Pt takes PRN Klonopin, qd Hydrochlorothiazide, qd Lisinopril. Pt is a non smoker. She denies postnasal drip, frequency, vomiting, abdominal pain. She denies pain.   PCP is Dr. Wenda Overland [facility] Dr. Simeon Craft  Past Medical History  Diagnosis Date  . Hypertension    Past Surgical History  Procedure Laterality Date  . Ectopic pregnancy surgery  10/2013   Family History  Problem Relation Age of Onset  . Hypertension Mother   .  Hypertension Father   . Hyperlipidemia Father   . Diabetes Brother   . Diabetes Other    Social History  Substance Use Topics  . Smoking status: Former Smoker -- 0.50 packs/day    Types: Cigarettes    Quit date: 06/10/2014  . Smokeless tobacco: Never Used  . Alcohol Use: No   Unemployed, has applied for disability  OB History    Gravida Para Term Preterm AB TAB SAB Ectopic Multiple Living   4 3 3  1   1  3      Review of Systems  Constitutional: Positive for fever.  HENT: Negative for postnasal drip.   Eyes: Positive for visual disturbance.  Gastrointestinal: Positive for nausea and diarrhea. Negative for vomiting and abdominal pain.  Genitourinary: Negative for frequency.  Neurological: Positive for dizziness, weakness and light-headedness.  All other systems reviewed and are negative.  Allergies  Coreg  Home Medications   Prior to Admission medications   Medication Sig Start Date End Date Taking? Authorizing Provider  amLODipine (NORVASC) 10 MG tablet Take 1 tablet (10 mg total) by mouth daily. 12/05/14   Evalee Jefferson, PA-C  aspirin EC 81 MG EC tablet Take 1 tablet (81 mg total) by mouth daily. 12/11/14   Erline Hau, MD  clonazePAM (KLONOPIN) 1 MG tablet Take 1 mg by mouth 3 (three) times daily as needed for anxiety.    Historical Provider, MD  homatropine 5 % ophthalmic solution Place 1 drop into the right eye 3 (three) times  daily.    Historical Provider, MD  hydrochlorothiazide (HYDRODIURIL) 12.5 MG tablet Take 2 tablets (25 mg total) by mouth daily. 12/11/14   Erline Hau, MD  ketorolac (ACULAR) 0.5 % ophthalmic solution Place 1 drop into the right eye 3 (three) times daily.    Historical Provider, MD  lisinopril (PRINIVIL,ZESTRIL) 10 MG tablet Take 1 tablet (10 mg total) by mouth daily. 12/11/14   Erline Hau, MD  trifluridine (VIROPTIC) 1 % ophthalmic solution Place 1 drop into the right eye as directed. 12/08/14   Historical  Provider, MD   Triage VS: BP 164/115 mmHg  Pulse 112  Temp(Src) 98.5 F (36.9 C) (Axillary)  Resp 18  Ht 5' 6.5" (1.689 m)  Wt 192 lb (87.091 kg)  BMI 30.53 kg/m2  SpO2 99%  LMP 05/27/2015  Vital signs normal except tachycardia, hypertension  Physical Exam  Constitutional: She is oriented to person, place, and time. She appears well-developed and well-nourished.  Non-toxic appearance. She does not appear ill. No distress.  HENT:  Head: Normocephalic and atraumatic.  Right Ear: External ear normal.  Left Ear: External ear normal.  Nose: Nose normal. No mucosal edema or rhinorrhea.  Mouth/Throat: Mucous membranes are normal. No dental abscesses or uvula swelling.  Bilateral nasal packing. Oropharynx and tongue is dry. She is unable to open her mouth more than a cm  Eyes: Conjunctivae and EOM are normal. Pupils are equal, round, and reactive to light.  Neck: Normal range of motion and full passive range of motion without pain. Neck supple.  Cardiovascular: Normal rate, regular rhythm and normal heart sounds.  Exam reveals no gallop and no friction rub.   No murmur heard. Pulmonary/Chest: Effort normal and breath sounds normal. No respiratory distress. She has no wheezes. She has no rhonchi. She has no rales. She exhibits no tenderness and no crepitus.  Abdominal: Soft. Normal appearance and bowel sounds are normal. She exhibits no distension. There is no tenderness. There is no rebound and no guarding.  Musculoskeletal: Normal range of motion. She exhibits no edema or tenderness.  Moves all extremities well.   Neurological: She is alert and oriented to person, place, and time. She has normal strength. No cranial nerve deficit.  Skin: Skin is warm, dry and intact. No rash noted. No erythema. No pallor.  Psychiatric: She has a normal mood and affect. Her speech is normal and behavior is normal. Her mood appears not anxious.  Nursing note and vitals reviewed.  ED Course  Procedures  (including critical care time)  Medications  0.9 %  sodium chloride infusion (0 mLs Intravenous Stopped 06/01/15 0031)    Followed by  0.9 %  sodium chloride infusion (0 mLs Intravenous Stopped 06/01/15 0159)    Followed by  0.9 %  sodium chloride infusion (not administered)  sodium chloride 0.9 % bolus 1,000 mL (not administered)  cloNIDine (CATAPRES) tablet 0.1 mg (0.1 mg Oral Given 06/01/15 0043)  sodium chloride 0.9 % bolus 1,000 mL (1,000 mLs Intravenous New Bag/Given 06/01/15 0200)    DIAGNOSTIC STUDIES: Oxygen Saturation is 99% on RA, normal by my interpretation.    COORDINATION OF CARE: 11:12 PM Discussed treatment plan with pt at bedside and pt agreed to plan.  12:49 AM Pt reevaluated. Discussed normal lab results. Will continue bolus treatment for hydration.   Recheck 02:00 pt starting her fourth liter of NS, states she is starting to feel the urge to have urine output.   Recheck at 02:50, patient  has finished her fourth liter of normal saline. She reports she went to the bathroom and had a large amount of urinary output. She states she's feeling better and feels ready to be discharged.    Labs Review Results for orders placed or performed during the hospital encounter of 05/31/15  Comprehensive metabolic panel  Result Value Ref Range   Sodium 137 135 - 145 mmol/L   Potassium 3.8 3.5 - 5.1 mmol/L   Chloride 101 101 - 111 mmol/L   CO2 27 22 - 32 mmol/L   Glucose, Bld 138 (H) 65 - 99 mg/dL   BUN 9 6 - 20 mg/dL   Creatinine, Ser 0.73 0.44 - 1.00 mg/dL   Calcium 9.5 8.9 - 10.3 mg/dL   Total Protein 8.3 (H) 6.5 - 8.1 g/dL   Albumin 4.3 3.5 - 5.0 g/dL   AST 31 15 - 41 U/L   ALT 42 14 - 54 U/L   Alkaline Phosphatase 72 38 - 126 U/L   Total Bilirubin 0.6 0.3 - 1.2 mg/dL   GFR calc non Af Amer >60 >60 mL/min   GFR calc Af Amer >60 >60 mL/min   Anion gap 9 5 - 15  CBC with Differential  Result Value Ref Range   WBC 7.0 4.0 - 10.5 K/uL   RBC 4.28 3.87 - 5.11 MIL/uL    Hemoglobin 11.8 (L) 12.0 - 15.0 g/dL   HCT 36.4 36.0 - 46.0 %   MCV 85.0 78.0 - 100.0 fL   MCH 27.6 26.0 - 34.0 pg   MCHC 32.4 30.0 - 36.0 g/dL   RDW 14.1 11.5 - 15.5 %   Platelets 292 150 - 400 K/uL   Neutrophils Relative % 63 %   Neutro Abs 4.4 1.7 - 7.7 K/uL   Lymphocytes Relative 30 %   Lymphs Abs 2.1 0.7 - 4.0 K/uL   Monocytes Relative 7 %   Monocytes Absolute 0.5 0.1 - 1.0 K/uL   Eosinophils Relative 0 %   Eosinophils Absolute 0.0 0.0 - 0.7 K/uL   Basophils Relative 0 %   Basophils Absolute 0.0 0.0 - 0.1 K/uL  Urinalysis, Routine w reflex microscopic  Result Value Ref Range   Color, Urine YELLOW YELLOW   APPearance CLEAR CLEAR   Specific Gravity, Urine >1.030 (H) 1.005 - 1.030   pH 5.5 5.0 - 8.0   Glucose, UA NEGATIVE NEGATIVE mg/dL   Hgb urine dipstick MODERATE (A) NEGATIVE   Bilirubin Urine NEGATIVE NEGATIVE   Ketones, ur TRACE (A) NEGATIVE mg/dL   Protein, ur TRACE (A) NEGATIVE mg/dL   Nitrite NEGATIVE NEGATIVE   Leukocytes, UA NEGATIVE NEGATIVE  Urine microscopic-add on  Result Value Ref Range   Squamous Epithelial / LPF 0-5 (A) NONE SEEN   WBC, UA 0-5 0 - 5 WBC/hpf   RBC / HPF 0-5 0 - 5 RBC/hpf   Bacteria, UA MANY (A) NONE SEEN   Crystals CA OXALATE CRYSTALS (A) NEGATIVE   Urine-Other MUCOUS PRESENT    Laboratory interpretation all normal except mild anemia, concentrated urine consistent with dehydration    I have personally reviewed and evaluated these lab results as part of my medical decision-making.  MDM   patient presents after having had a tonsillectomy and having poor oral intake with symptoms and signs of dehydration. She improved with IV fluid hydration. Her electrolytes were normal.    Final diagnoses:  Dehydration  Weakness   Plan discharge  Rolland Porter, MD, FACEP   I personally performed the  services described in this documentation, which was scribed in my presence. The recorded information has been reviewed and considered.  Rolland Porter,  MD, Barbette Or, MD 06/01/15 [phone]

## 2015-05-31 NOTE — ED Notes (Signed)
Patient was given apple and grape juice to drank.

## 2015-05-31 NOTE — ED Notes (Signed)
Pt c/o dizzness and weakness since having surgery Wednesday.

## 2015-06-01 LAB — COMPREHENSIVE METABOLIC PANEL
ALT: 42 U/L (ref 14–54)
ANION GAP: 9 (ref 5–15)
AST: 31 U/L (ref 15–41)
Albumin: 4.3 g/dL (ref 3.5–5.0)
Alkaline Phosphatase: 72 U/L (ref 38–126)
BUN: 9 mg/dL (ref 6–20)
CO2: 27 mmol/L (ref 22–32)
Calcium: 9.5 mg/dL (ref 8.9–10.3)
Chloride: 101 mmol/L (ref 101–111)
Creatinine, Ser: 0.73 mg/dL (ref 0.44–1.00)
Glucose, Bld: 138 mg/dL — ABNORMAL HIGH (ref 65–99)
POTASSIUM: 3.8 mmol/L (ref 3.5–5.1)
SODIUM: 137 mmol/L (ref 135–145)
Total Bilirubin: 0.6 mg/dL (ref 0.3–1.2)
Total Protein: 8.3 g/dL — ABNORMAL HIGH (ref 6.5–8.1)

## 2015-06-01 LAB — URINE MICROSCOPIC-ADD ON

## 2015-06-01 LAB — URINALYSIS, ROUTINE W REFLEX MICROSCOPIC
BILIRUBIN URINE: NEGATIVE
Glucose, UA: NEGATIVE mg/dL
LEUKOCYTES UA: NEGATIVE
Nitrite: NEGATIVE
PH: 5.5 (ref 5.0–8.0)

## 2015-06-01 MED ORDER — CLONIDINE HCL 0.1 MG PO TABS
0.1000 mg | ORAL_TABLET | Freq: Once | ORAL | Status: AC
  Administered 2015-06-01: 0.1 mg via ORAL
  Filled 2015-06-01: qty 1

## 2015-06-01 MED ORDER — SODIUM CHLORIDE 0.9 % IV BOLUS (SEPSIS): 1000.0000 mL | Freq: Once | INTRAVENOUS | Status: DC

## 2015-06-01 MED ORDER — SODIUM CHLORIDE 0.9 % IV BOLUS (SEPSIS)
1000.0000 mL | Freq: Once | INTRAVENOUS | Status: AC
  Administered 2015-06-01: 1000 mL via INTRAVENOUS

## 2015-06-01 NOTE — Discharge Instructions (Signed)
Try to drink a lot of fluids now so you don't get dehydrated again. Keep your appointment later this morning with Dr. Simeon Craft as you have scheduled. Repeat check if you feel worse again.

## 2015-06-04 ENCOUNTER — Encounter (HOSPITAL_COMMUNITY): Payer: Self-pay | Admitting: Emergency Medicine

## 2015-06-04 ENCOUNTER — Emergency Department (HOSPITAL_COMMUNITY)
Admission: EM | Admit: 2015-06-04 | Discharge: 2015-06-04 | Disposition: A | Discharge status: Home / Self Care | Payer: 59 | Attending: Emergency Medicine | Admitting: Emergency Medicine

## 2015-06-04 DIAGNOSIS — I10 Essential (primary) hypertension: Secondary | ICD-10-CM | POA: Diagnosis not present

## 2015-06-04 DIAGNOSIS — Z87891 Personal history of nicotine dependence: Secondary | ICD-10-CM | POA: Diagnosis not present

## 2015-06-04 DIAGNOSIS — R197 Diarrhea, unspecified: Secondary | ICD-10-CM | POA: Insufficient documentation

## 2015-06-04 DIAGNOSIS — Z8709 Personal history of other diseases of the respiratory system: Secondary | ICD-10-CM | POA: Insufficient documentation

## 2015-06-04 DIAGNOSIS — Z792 Long term (current) use of antibiotics: Secondary | ICD-10-CM | POA: Diagnosis not present

## 2015-06-04 DIAGNOSIS — Z3202 Encounter for pregnancy test, result negative: Secondary | ICD-10-CM | POA: Insufficient documentation

## 2015-06-04 DIAGNOSIS — R112 Nausea with vomiting, unspecified: Secondary | ICD-10-CM | POA: Diagnosis not present

## 2015-06-04 DIAGNOSIS — Z79899 Other long term (current) drug therapy: Secondary | ICD-10-CM | POA: Diagnosis not present

## 2015-06-04 HISTORY — DX: Deviated nasal septum: J34.2

## 2015-06-04 LAB — CBC
HCT: 36.6 % (ref 36.0–46.0)
Hemoglobin: 11.5 g/dL — ABNORMAL LOW (ref 12.0–15.0)
MCH: 26.8 pg (ref 26.0–34.0)
MCHC: 31.4 g/dL (ref 30.0–36.0)
MCV: 85.3 fL (ref 78.0–100.0)
Platelets: 376 10*3/uL (ref 150–400)
RBC: 4.29 MIL/uL (ref 3.87–5.11)
RDW: 14.5 % (ref 11.5–15.5)
WBC: 7.6 10*3/uL (ref 4.0–10.5)

## 2015-06-04 LAB — COMPREHENSIVE METABOLIC PANEL
ALT: 28 U/L (ref 14–54)
AST: 22 U/L (ref 15–41)
Albumin: 4.3 g/dL (ref 3.5–5.0)
Alkaline Phosphatase: 75 U/L (ref 38–126)
Anion gap: 10 (ref 5–15)
BUN: 6 mg/dL (ref 6–20)
CO2: 28 mmol/L (ref 22–32)
Calcium: 9.5 mg/dL (ref 8.9–10.3)
Chloride: 102 mmol/L (ref 101–111)
Creatinine, Ser: 0.68 mg/dL (ref 0.44–1.00)
GFR calc Af Amer: >60 mL/min (ref >60)
GFR calc non Af Amer: >60 mL/min (ref >60)
Glucose, Bld: 130 mg/dL — ABNORMAL HIGH (ref 65–99)
Potassium: 3.5 mmol/L (ref 3.5–5.1)
Sodium: 140 mmol/L (ref 135–145)
Total Bilirubin: 0.6 mg/dL (ref 0.3–1.2)
Total Protein: 8.1 g/dL (ref 6.5–8.1)

## 2015-06-04 LAB — LIPASE, BLOOD: Lipase: 18 U/L (ref 11–51)

## 2015-06-04 LAB — URINALYSIS, ROUTINE W REFLEX MICROSCOPIC
Bilirubin Urine: NEGATIVE
Glucose, UA: NEGATIVE mg/dL
Ketones, ur: NEGATIVE mg/dL
Leukocytes, UA: NEGATIVE
Nitrite: NEGATIVE
Protein, ur: 30 mg/dL — AB
Specific Gravity, Urine: 1.015 (ref 1.005–1.030)
pH: 8 (ref 5.0–8.0)

## 2015-06-04 LAB — URINE MICROSCOPIC-ADD ON

## 2015-06-04 LAB — PREGNANCY, URINE: Preg Test, Ur: NEGATIVE

## 2015-06-04 MED ORDER — SODIUM CHLORIDE 0.9 % IV BOLUS (SEPSIS)
1000.0000 mL | Freq: Once | INTRAVENOUS | Status: AC
  Administered 2015-06-04: 1000 mL via INTRAVENOUS

## 2015-06-04 MED ORDER — LOPERAMIDE HCL 2 MG PO CAPS
4.0000 mg | ORAL_CAPSULE | Freq: Once | ORAL | Status: AC
Start: 2015-06-04 — End: 2015-06-04
  Administered 2015-06-04: 4 mg via ORAL
  Filled 2015-06-04: qty 2

## 2015-06-04 MED ORDER — PROMETHAZINE HCL 25 MG/ML IJ SOLN
12.5000 mg | Freq: Once | INTRAMUSCULAR | Status: AC
  Administered 2015-06-04: 12.5 mg via INTRAVENOUS
  Filled 2015-06-04: qty 1

## 2015-06-04 MED ORDER — KETOROLAC TROMETHAMINE 30 MG/ML IJ SOLN
15.0000 mg | Freq: Once | INTRAMUSCULAR | Status: AC
  Administered 2015-06-04: 15 mg via INTRAVENOUS
  Filled 2015-06-04: qty 1

## 2015-06-04 MED ORDER — HYDROMORPHONE HCL 1 MG/ML IJ SOLN
1.0000 mg | Freq: Once | INTRAMUSCULAR | Status: AC
  Administered 2015-06-04: 1 mg via INTRAVENOUS
  Filled 2015-06-04: qty 1

## 2015-06-04 MED ORDER — ONDANSETRON HCL 4 MG/2ML IJ SOLN
4.0000 mg | Freq: Once | INTRAMUSCULAR | Status: AC
  Administered 2015-06-04: 4 mg via INTRAVENOUS
  Filled 2015-06-04: qty 2

## 2015-06-04 NOTE — ED Notes (Signed)
EDP at bedside  

## 2015-06-04 NOTE — ED Notes (Signed)
Pt c/o nausea, vomiting, and diarrhea that began this morning. Pt seen here last Sunday for same complaint. States that symptoms improved, but began again today.

## 2015-06-07 LAB — URINE CULTURE: Culture: >=100000

## 2015-06-08 ENCOUNTER — Telehealth (HOSPITAL_COMMUNITY): Payer: Self-pay

## 2015-06-08 NOTE — Telephone Encounter (Signed)
Post ED Visit - Positive Culture Follow-up: Chart Hand-off to ED Flow Manager  Culture assessed and recommendations reviewed by: []  Levester Fresh, Pharm.D., BCPS []  Heide Guile, Pharm.D., BCPS-AQ ID []  Alycia Rossetti, Pharm.D., BCPS []  [place], Pharm.D., BCPS, AAHIVP [x]  Legrand Como, Pharm .D., BCPS, AAHIVP []  Milus Glazier, Pharm.D. []  Dimitri Ped, Pharm.D.  Positive urine culture  []  Patient discharged without antimicrobial prescription and treatment is now indicated [x]  Organism is resistant to prescribed ED discharge antimicrobial []  Patient with positive blood cultures  Changes discussed with ED provider: S. Barrett PAC New antibiotic prescription If improved no treatment. If continued symptoms macrobid 100 mg po bid x 5days  Spoke with pt. Feels fine. No medication called in at this time   Ileene Musa 06/08/2015, 11:41 AM

## 2015-06-08 NOTE — Progress Notes (Signed)
ED Antimicrobial Stewardship Positive Culture Follow Up   Maria Lang is an 34 y.o. female who presented to [facility] on 06/04/2015 with a chief complaint of  Chief Complaint  Patient presents with  . Emesis  . Diarrhea    Recent Results (from the past 720 hour(s))  Urine culture     Status: None   Collection Time: 06/04/15  6:00 PM  Result Value Ref Range Status   Specimen Description URINE, CLEAN CATCH  Final   Special Requests NONE  Final   Culture   Final    >=100,000 COLONIES/mL STAPHYLOCOCCUS SPECIES (COAGULASE NEGATIVE) Performed at [facility]    Report Status 06/07/2015 FINAL  Final   Organism ID, Bacteria STAPHYLOCOCCUS SPECIES (COAGULASE NEGATIVE)  Final      Susceptibility   Staphylococcus species (coagulase negative) - MIC*    CIPROFLOXACIN <=0.5 SENSITIVE Sensitive     GENTAMICIN <=0.5 SENSITIVE Sensitive     NITROFURANTOIN <=16 SENSITIVE Sensitive     OXACILLIN >=4 RESISTANT Resistant     TETRACYCLINE 2 SENSITIVE Sensitive     VANCOMYCIN 2 SENSITIVE Sensitive     TRIMETH/SULFA 160 RESISTANT Resistant     CLINDAMYCIN <=0.25 SENSITIVE Sensitive     RIFAMPIN <=0.5 SENSITIVE Sensitive     Inducible Clindamycin NEGATIVE Sensitive     * >=100,000 COLONIES/mL STAPHYLOCOCCUS SPECIES (COAGULASE NEGATIVE)    [x]  Patient discharged originally without antimicrobial agent and treatment may now be indicated  Buyer, retail to call patient. If symptoms are improved - no treatment. If she continues to have nausea, vomiting, diarrhea - Macrobid 100mg  PO BID x 5 days  ED Provider: Josephina Gip, PA-C   Norva Riffle 06/08/2015, 9:59 AM Infectious Diseases Pharmacist Phone# [phone]

## 2015-06-15 NOTE — ED Provider Notes (Signed)
CSN: [id number]     Arrival date & time 06/04/15  1354 History   First MD Initiated Contact with Patient 06/04/15 1538     Chief Complaint  Patient presents with  . Emesis  . Diarrhea     (Consider location/radiation/quality/duration/timing/severity/associated sxs/prior Treatment) HPI   34yF with n/v/d. Recently seen for similar a few days ago. Symptoms improved and then returned again today. Feels generally weak. Subjective fever. No blood in stool or emesis. No sick contacts, mild, diffuse crampy abdominal pain. No urinary complaints. No respiratory complaints.   Past Medical History  Diagnosis Date  . Hypertension   . Deviated septum    Past Surgical History  Procedure Laterality Date  . Ectopic pregnancy surgery  10/2013  . Tonsillectomy    . Tympanostomy     Family History  Problem Relation Age of Onset  . Hypertension Mother   . Hypertension Father   . Hyperlipidemia Father   . Diabetes Brother   . Diabetes Other    Social History  Substance Use Topics  . Smoking status: Former Smoker -- 0.50 packs/day    Types: Cigarettes    Quit date: 06/10/2014  . Smokeless tobacco: Never Used  . Alcohol Use: No   OB History    Gravida Para Term Preterm AB TAB SAB Ectopic Multiple Living   4 3 3  1   1  3      Review of Systems  All systems reviewed and negative, other than as noted in HPI.   Allergies  Coreg and Hydrocodeine  Home Medications   Prior to Admission medications   Medication Sig Start Date End Date Taking? Authorizing Provider  amLODipine (NORVASC) 10 MG tablet Take 1 tablet (10 mg total) by mouth daily. 12/05/14  Yes Almyra Free Idol, PA-C  amoxicillin (AMOXIL) 400 MG/5ML suspension Take 480 mg by mouth 2 (two) times daily. 10 day course started on 05/25/2015   Yes Historical Provider, MD  clonazePAM (KLONOPIN) 1 MG tablet Take 1 mg by mouth 3 (three) times daily as needed for anxiety.   Yes Historical Provider, MD  hydrochlorothiazide (HYDRODIURIL) 12.5 MG  tablet Take 2 tablets (25 mg total) by mouth daily. 12/11/14  Yes Erline Hau, MD  lisinopril (PRINIVIL,ZESTRIL) 40 MG tablet Take 40 mg by mouth daily.   Yes Historical Provider, MD  ondansetron (ZOFRAN-ODT) 4 MG disintegrating tablet Take 4 mg by mouth every 8 (eight) hours as needed for nausea or vomiting.   Yes Historical Provider, MD  oxyCODONE-acetaminophen (ROXICET) 5-325 MG/5ML solution Take 5-10 mLs by mouth every 4 (four) hours as needed for moderate pain or severe pain.   Yes Historical Provider, MD  ranitidine (ZANTAC) 150 MG tablet Take 150 mg by mouth daily as needed for heartburn.   Yes Historical Provider, MD  trimethoprim-polymyxin b (POLYTRIM) ophthalmic solution Place 5 drops into both ears 4 (four) times daily as needed (for 14 days starting on 05/25/2015).   Yes Historical Provider, MD   BP 161/118 mmHg  Pulse 90  Temp(Src) 97.6 F (36.4 C) (Oral)  Resp 16  Ht 5\' 6"  (1.676 m)  Wt 186 lb (84.369 kg)  BMI 30.04 kg/m2  SpO2 100%  LMP 05/27/2015 Physical Exam  Constitutional: She appears well-developed and well-nourished. No distress.  HENT:  Head: Normocephalic and atraumatic.  Eyes: Conjunctivae are normal. Right eye exhibits no discharge. Left eye exhibits no discharge.  Neck: Neck supple.  Cardiovascular: Normal rate, regular rhythm and normal heart sounds.  Exam reveals  no gallop and no friction rub.   No murmur heard. Pulmonary/Chest: Effort normal and breath sounds normal. No respiratory distress.  Abdominal: Soft. She exhibits no distension. There is no tenderness.  Musculoskeletal: She exhibits no edema or tenderness.  Neurological: She is alert.  Skin: Skin is warm and dry.  Psychiatric: She has a normal mood and affect. Her behavior is normal. Thought content normal.  Nursing note and vitals reviewed.   ED Course  Procedures (including critical care time) Labs Review Labs Reviewed  COMPREHENSIVE METABOLIC PANEL - Abnormal; Notable for the  following:    Glucose, Bld 130 (*)    All other components within normal limits  CBC - Abnormal; Notable for the following:    Hemoglobin 11.5 (*)    All other components within normal limits  URINALYSIS, ROUTINE W REFLEX MICROSCOPIC (NOT AT [facility]) - Abnormal; Notable for the following:    Hgb urine dipstick MODERATE (*)    Protein, ur 30 (*)    All other components within normal limits  URINE MICROSCOPIC-ADD ON - Abnormal; Notable for the following:    Squamous Epithelial / LPF 6-30 (*)    Bacteria, UA MANY (*)    All other components within normal limits  URINE CULTURE  LIPASE, BLOOD  PREGNANCY, URINE    Imaging Review No results found. I have personally reviewed and evaluated these images and lab results as part of my medical decision-making.   EKG Interpretation None      MDM   Final diagnoses:  Nausea vomiting and diarrhea    34yF with n/v/d. Suspect viral illness. It has been determined that no acute conditions requiring further emergency intervention are present at this time. The patient has been advised of the diagnosis and plan. I reviewed any labs and imaging including any potential incidental findings. We have discussed signs and symptoms that warrant return to the ED and they are listed in the discharge instructions.She understands she needs to discuss with PCP concerning her BP.      Virgel Manifold, MD 06/15/15 1213

## 2015-07-22 ENCOUNTER — Encounter (HOSPITAL_COMMUNITY): Payer: Self-pay | Admitting: *Deleted

## 2015-07-22 ENCOUNTER — Emergency Department (HOSPITAL_COMMUNITY)
Admission: EM | Admit: 2015-07-22 | Discharge: 2015-07-22 | Disposition: A | Discharge status: Home / Self Care | Payer: BLUE CROSS/BLUE SHIELD | Attending: Emergency Medicine | Admitting: Emergency Medicine

## 2015-07-22 ENCOUNTER — Emergency Department (HOSPITAL_COMMUNITY): Payer: BLUE CROSS/BLUE SHIELD

## 2015-07-22 DIAGNOSIS — Z87891 Personal history of nicotine dependence: Secondary | ICD-10-CM | POA: Diagnosis not present

## 2015-07-22 DIAGNOSIS — R109 Unspecified abdominal pain: Secondary | ICD-10-CM | POA: Insufficient documentation

## 2015-07-22 DIAGNOSIS — Z3202 Encounter for pregnancy test, result negative: Secondary | ICD-10-CM | POA: Diagnosis not present

## 2015-07-22 DIAGNOSIS — Z79899 Other long term (current) drug therapy: Secondary | ICD-10-CM | POA: Diagnosis not present

## 2015-07-22 DIAGNOSIS — I10 Essential (primary) hypertension: Secondary | ICD-10-CM | POA: Insufficient documentation

## 2015-07-22 DIAGNOSIS — M545 Low back pain: Secondary | ICD-10-CM | POA: Diagnosis not present

## 2015-07-22 DIAGNOSIS — Z8709 Personal history of other diseases of the respiratory system: Secondary | ICD-10-CM | POA: Insufficient documentation

## 2015-07-22 LAB — CBC WITH DIFFERENTIAL/PLATELET
Basophils Absolute: 0 10*3/uL (ref 0.0–0.1)
Basophils Relative: 0 %
Eosinophils Absolute: 0 10*3/uL (ref 0.0–0.7)
Eosinophils Relative: 0 %
HCT: 36 % (ref 36.0–46.0)
HEMOGLOBIN: 11.2 g/dL — AB (ref 12.0–15.0)
LYMPHS ABS: 2.5 10*3/uL (ref 0.7–4.0)
LYMPHS PCT: 34 %
MCH: 26.2 pg (ref 26.0–34.0)
MCHC: 31.1 g/dL (ref 30.0–36.0)
MCV: 84.1 fL (ref 78.0–100.0)
Monocytes Absolute: 0.4 10*3/uL (ref 0.1–1.0)
Monocytes Relative: 6 %
NEUTROS PCT: 60 %
Neutro Abs: 4.4 10*3/uL (ref 1.7–7.7)
Platelets: 323 10*3/uL (ref 150–400)
RBC: 4.28 MIL/uL (ref 3.87–5.11)
RDW: 14.6 % (ref 11.5–15.5)
WBC: 7.3 10*3/uL (ref 4.0–10.5)

## 2015-07-22 LAB — URINALYSIS, ROUTINE W REFLEX MICROSCOPIC
Bilirubin Urine: NEGATIVE
Glucose, UA: NEGATIVE mg/dL
Ketones, ur: NEGATIVE mg/dL
Leukocytes, UA: NEGATIVE
NITRITE: NEGATIVE
Protein, ur: NEGATIVE mg/dL
SPECIFIC GRAVITY, URINE: 1.015 (ref 1.005–1.030)
pH: 5.5 (ref 5.0–8.0)

## 2015-07-22 LAB — COMPREHENSIVE METABOLIC PANEL
ALT: 20 U/L (ref 14–54)
AST: 17 U/L (ref 15–41)
Albumin: 4.4 g/dL (ref 3.5–5.0)
Alkaline Phosphatase: 70 U/L (ref 38–126)
Anion gap: 10 (ref 5–15)
BUN: 11 mg/dL (ref 6–20)
CALCIUM: 9.6 mg/dL (ref 8.9–10.3)
CO2: 27 mmol/L (ref 22–32)
CREATININE: 0.72 mg/dL (ref 0.44–1.00)
Chloride: 105 mmol/L (ref 101–111)
Glucose, Bld: 96 mg/dL (ref 65–99)
Potassium: 3.7 mmol/L (ref 3.5–5.1)
SODIUM: 142 mmol/L (ref 135–145)
Total Bilirubin: 0.5 mg/dL (ref 0.3–1.2)
Total Protein: 7.6 g/dL (ref 6.5–8.1)

## 2015-07-22 LAB — URINE MICROSCOPIC-ADD ON
Bacteria, UA: NONE SEEN
SQUAMOUS EPITHELIAL / LPF: NONE SEEN
WBC, UA: NONE SEEN WBC/hpf (ref 0–5)

## 2015-07-22 LAB — LIPASE, BLOOD: Lipase: 15 U/L (ref 11–51)

## 2015-07-22 LAB — PREGNANCY, URINE: Preg Test, Ur: NEGATIVE

## 2015-07-22 IMAGING — CT CT ABD-PELV W/ CM
2 of 4 series · 16 of 46 positions shown, 18 images · IV contrast (Omnipaque 300)
Comparison: [DATE]

CLINICAL DATA: Back pain beginning 3 days ago, RIGHT lower quadrant
pain beginning yesterday, questionable history of kidney stones,
hypertension

EXAM:
CT ABDOMEN AND PELVIS WITH CONTRAST
TECHNIQUE: Multidetector CT imaging of the abdomen and pelvis was performed
using the standard protocol following bolus administration of
intravenous contrast. Sagittal and coronal MPR images reconstructed
from axial data set.
CONTRAST:  100mL OMNIPAQUE IOHEXOL 300 MG/ML SOLN IV. Dilute oral
contrast.

[Series 2: abd_pel_with 5.0 b40f · axial · 0.79mm/px · z∈[-426,-1]mm · 13 of 93 slices shown, 15 images]
[im 4/93  soft-tissue]
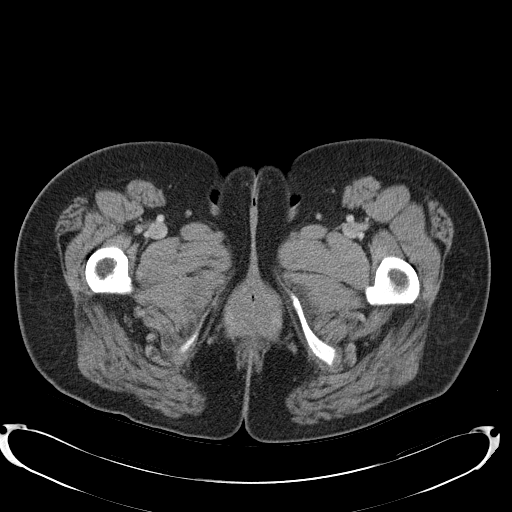
[im 4/93  bone]
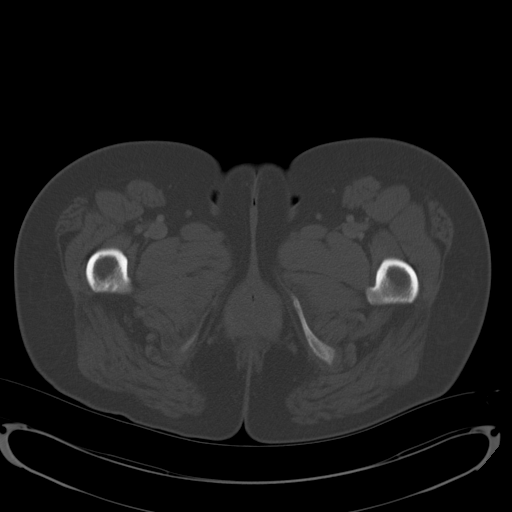
[im 12/93  soft-tissue]
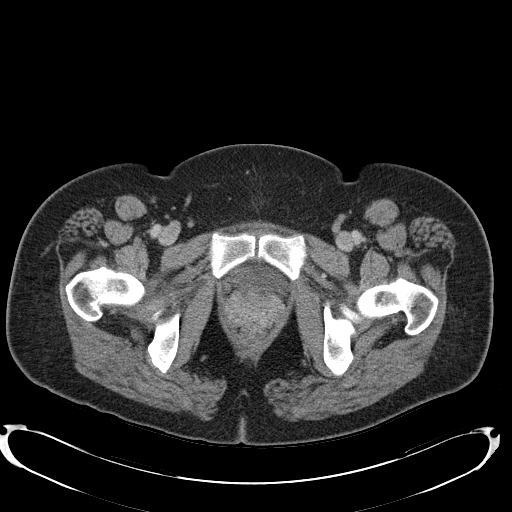
[im 19/93  soft-tissue]
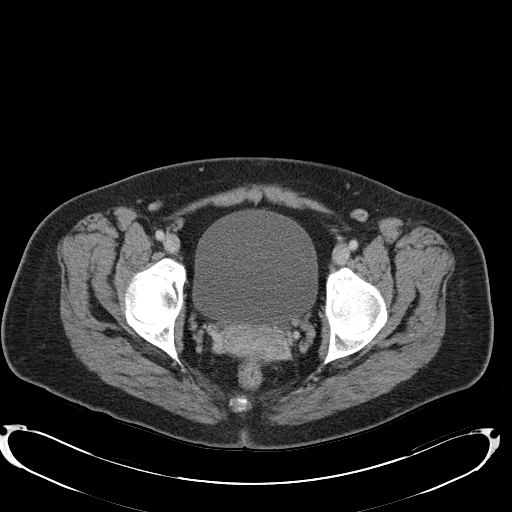
[im 26/93  soft-tissue]
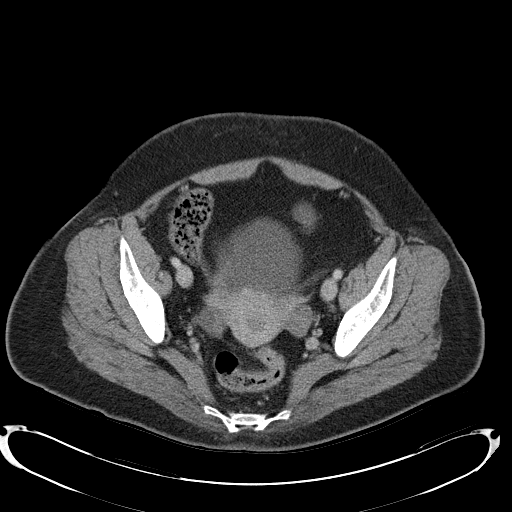
[im 34/93  soft-tissue]
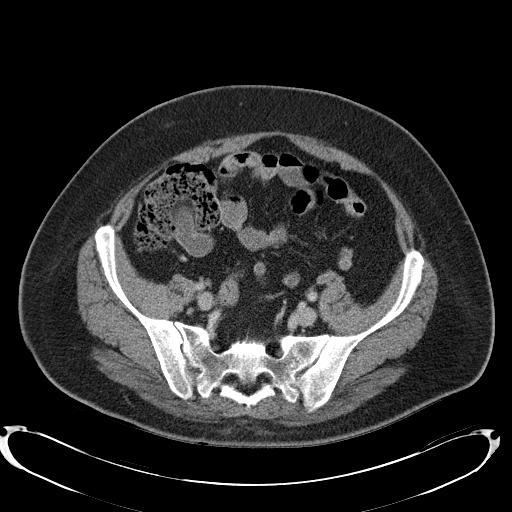
[im 41/93  soft-tissue]
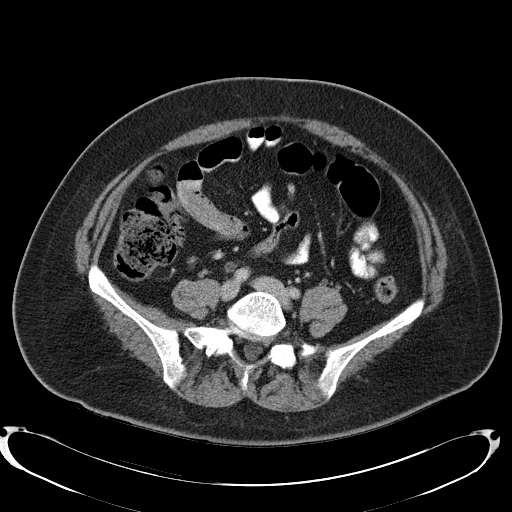
[im 48/93  soft-tissue]
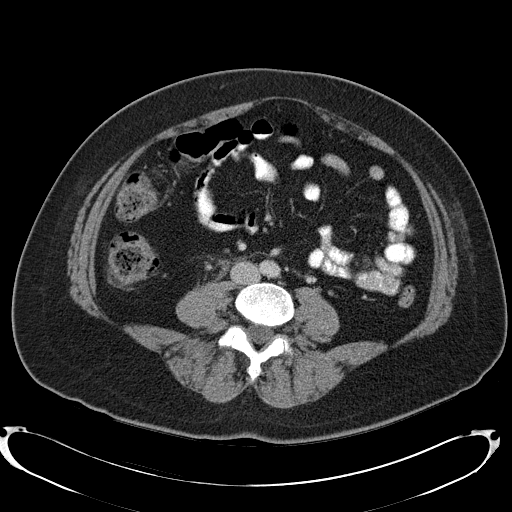
[im 52/93  soft-tissue]
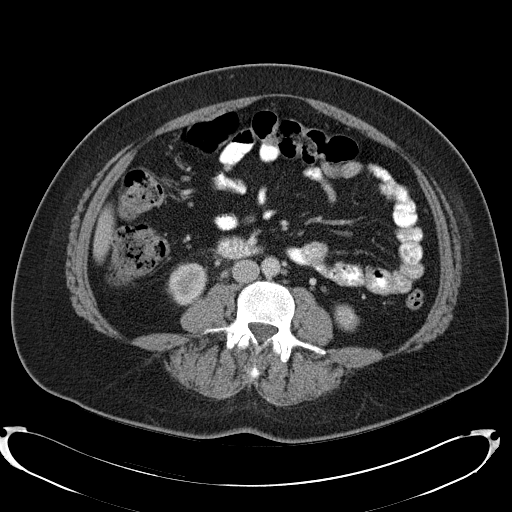
[im 59/93  soft-tissue]
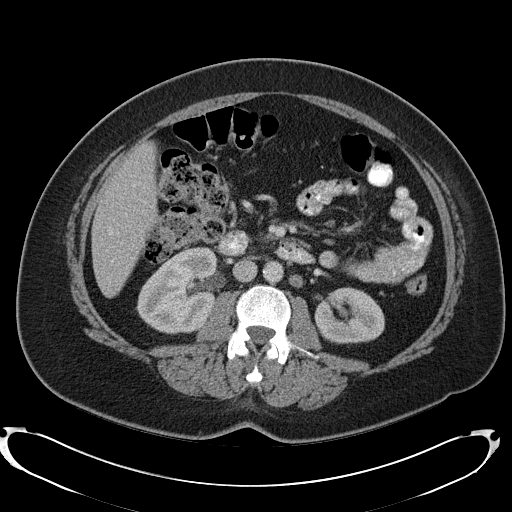
[im 59/93  bone]
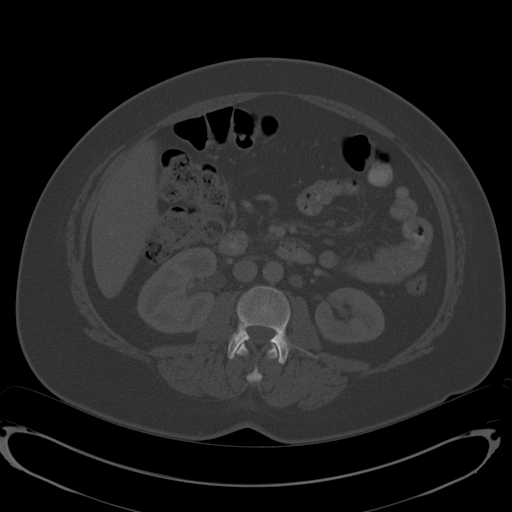
[im 67/93  soft-tissue]
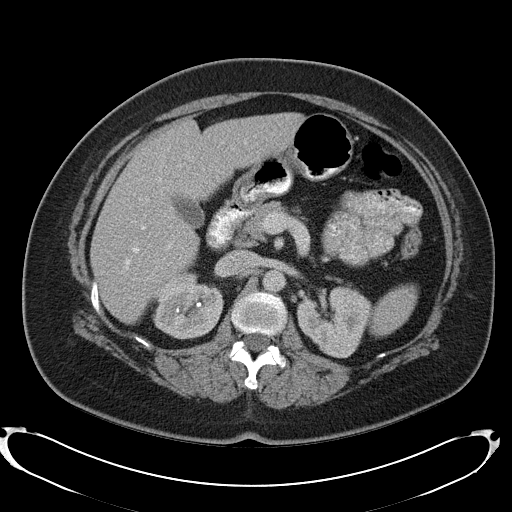
[im 74/93  soft-tissue]
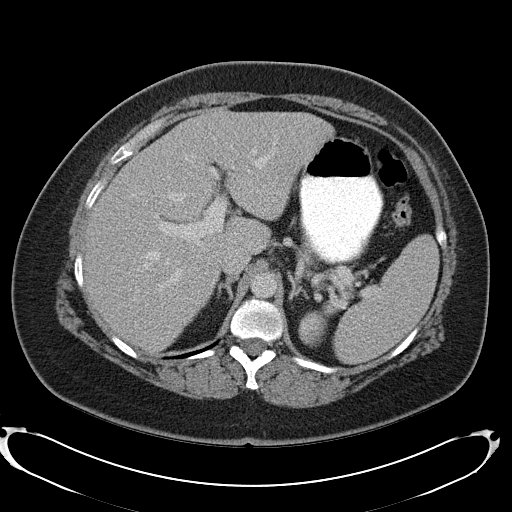
[im 81/93  soft-tissue]
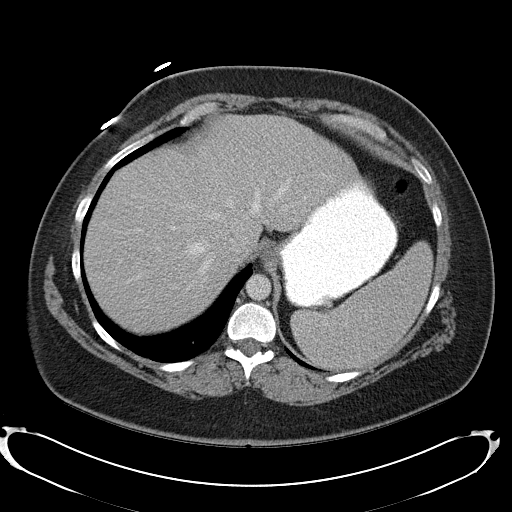
[im 89/93  soft-tissue]
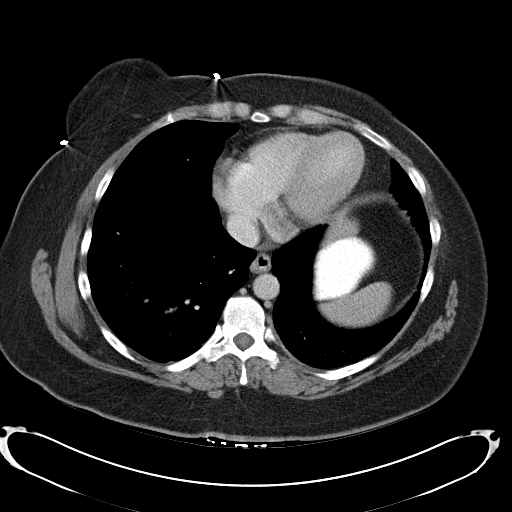

[Series 4: abd_pel_with 3.0 spo · coronal · 0.78mm/px · 3 of 96 slices shown]
[im 32/96  soft-tissue]
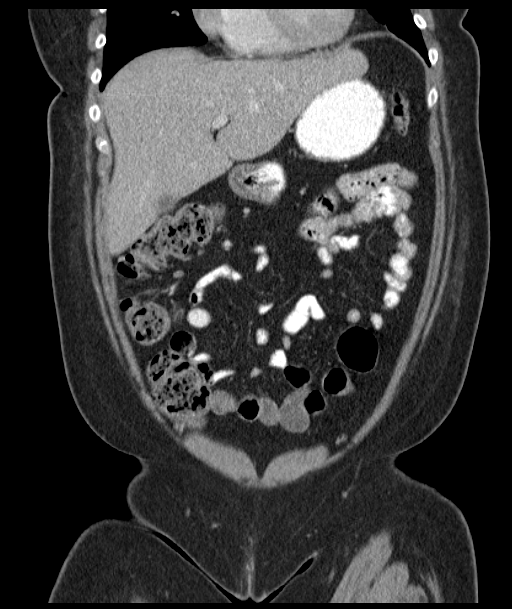
[im 43/96  soft-tissue]
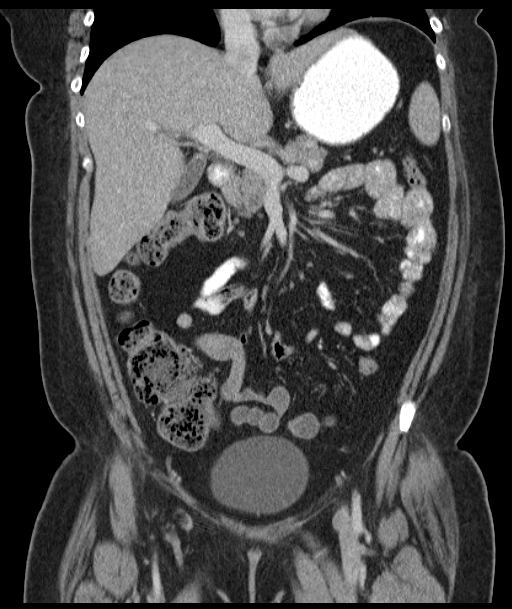
[im 53/96  soft-tissue]
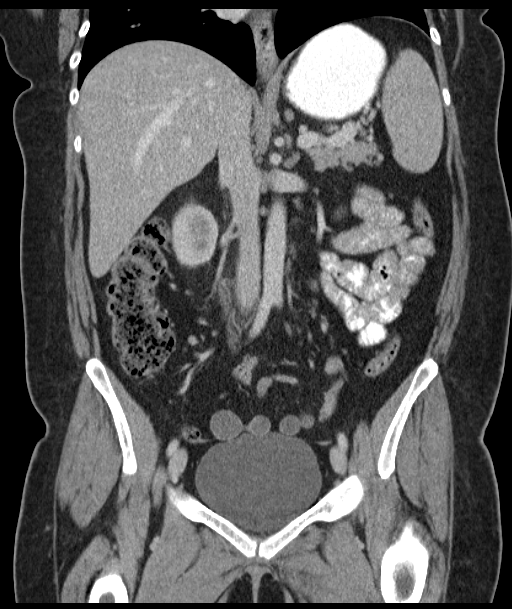

[16 of 46 positions shown; findings below may reference images not displayed]

FINDINGS: Minimal dependent atelectasis LEFT lower lobe.

BILATERAL nonobstructing renal calculi.

Mild enhancement of the RIGHT ureteral pelvic junction and proximal
RIGHT ureter with adjacent periureteral edema compatible with
inflammatory process question urinary tract infection though this
could also be seen with prior stone passage.

Proximal RIGHT ureter minimally prominent in size but no definite
point of obstruction or ureteral calcification is seen.

Potential tiny cyst at upper pole RIGHT kidney 9 mm diameter.

Liver, gallbladder, spleen, pancreas, kidneys, and adrenal glands
otherwise unremarkable.

Normal appendix.

Bladder, LEFT ureter, uterus and adnexa unremarkable.

Stomach and bowel loops normal appearance.

No mass, adenopathy, free air, free fluid or additional inflammatory
process.

Bones unremarkable.
IMPRESSION: BILATERAL nonobstructing small renal calculi.

Minimal enlargement of the proximal RIGHT ureter with hyper
enhancement and mild periureteral edema extending to the
ureteropelvic junction, raising question of infection or potentially
prior stone passage ; correlation with urinalysis recommended.

## 2015-07-22 MED ORDER — OXYCODONE-ACETAMINOPHEN 5-325 MG PO TABS
1.0000 | ORAL_TABLET | ORAL | Status: DC | PRN
Start: 2015-07-22 — End: 2015-07-29

## 2015-07-22 MED ORDER — IOHEXOL 300 MG/ML  SOLN
100.0000 mL | Freq: Once | INTRAMUSCULAR | Status: AC | PRN
  Administered 2015-07-22: 100 mL via INTRAVENOUS

## 2015-07-22 MED ORDER — SODIUM CHLORIDE 0.9 % IV BOLUS (SEPSIS)
1000.0000 mL | Freq: Once | INTRAVENOUS | Status: AC
  Administered 2015-07-22: 1000 mL via INTRAVENOUS

## 2015-07-22 MED ORDER — FENTANYL CITRATE (PF) 100 MCG/2ML IJ SOLN
50.0000 ug | Freq: Once | INTRAMUSCULAR | Status: AC
  Administered 2015-07-22: 50 ug via INTRAVENOUS
  Filled 2015-07-22: qty 2

## 2015-07-22 MED ORDER — DIATRIZOATE MEGLUMINE & SODIUM 66-10 % PO SOLN
ORAL | Status: AC
  Administered 2015-07-22: 15 mL
  Filled 2015-07-22: qty 30

## 2015-07-22 MED ORDER — ONDANSETRON HCL 4 MG/2ML IJ SOLN
4.0000 mg | Freq: Once | INTRAMUSCULAR | Status: AC
  Administered 2015-07-22: 4 mg via INTRAVENOUS
  Filled 2015-07-22: qty 2

## 2015-07-22 MED ORDER — HYDROMORPHONE HCL 1 MG/ML IJ SOLN: 0.5000 mg | Freq: Once | INTRAMUSCULAR | Status: DC

## 2015-07-22 MED ORDER — KETOROLAC TROMETHAMINE 30 MG/ML IJ SOLN
30.0000 mg | Freq: Once | INTRAMUSCULAR | Status: AC
  Administered 2015-07-22: 30 mg via INTRAVENOUS
  Filled 2015-07-22: qty 1

## 2015-07-22 MED ORDER — FENTANYL CITRATE (PF) 100 MCG/2ML IJ SOLN
50.0000 ug | Freq: Once | INTRAMUSCULAR | Status: AC
Start: 2015-07-22 — End: 2015-07-22
  Administered 2015-07-22: 50 ug via INTRAVENOUS
  Filled 2015-07-22: qty 2

## 2015-07-22 NOTE — Discharge Instructions (Signed)
Flank Pain °Flank pain is pain in your side. The flank is the area of your side between your upper belly (abdomen) and your back. Pain in this area can be caused by many different things. °HOME CARE °Home care and treatment will depend on the cause of your pain. °· Rest as told by your doctor. °· Drink enough fluids to keep your pee (urine) clear or pale yellow.   °· Only take medicine as told by your doctor. °· Tell your doctor about any changes in your pain. °· Follow up with your doctor. °GET HELP RIGHT AWAY IF:  °· Your pain does not get better with medicine.   °· You have new symptoms or your symptoms get worse. °· Your pain gets worse.   °· You have belly (abdominal) pain.   °· You are short of breath.   °· You always feel sick to your stomach (nauseous).   °· You keep throwing up (vomiting).   °· You have puffiness (swelling) in your belly.   °· You feel light-headed or you pass out (faint).   °· You have blood in your pee. °· You have a fever or lasting symptoms for more than 2-3 days. °· You have a fever and your symptoms suddenly get worse. °MAKE SURE YOU:  °· Understand these instructions. °· Will watch your condition. °· Will get help right away if you are not doing well or get worse. °  °This information is not intended to replace advice given to you by your health care provider. Make sure you discuss any questions you have with your health care provider. °  °Document Released: 03/21/2008 Document Revised: 07/03/2014 Document Reviewed: 01/25/2012 °Elsevier Interactive Patient Education ©2016 [employer] ° °

## 2015-07-22 NOTE — ED Notes (Signed)
Pt began having back pain 3 days ago  And began having RLQ pain yesterday. Pt denies n/v/d, no urinary symptoms.

## 2015-07-22 NOTE — ED Provider Notes (Signed)
CSN: [id number]     Arrival date & time 07/22/15  0909 History   First MD Initiated Contact with Patient 07/22/15 [phone]     Chief Complaint  Patient presents with  . Abdominal Pain  . Back Pain     (Consider location/radiation/quality/duration/timing/severity/associated sxs/prior Treatment) HPI   Maria Lang is a 35 y.o. female who presents to the Emergency Department complaining of gradual onset of right sided low back pain beginning three days ago, describes an aching constant pain and also developed pain radiating from her back to her right lower abdomen and groin that started yesterday.  Pain is constant and unaffected by food intake.  Worsens with certain movements.  She has not tried any medications for symptom relief.  She denies fever, N/V, diarrhea, dysuria, chest pain or shortness of breath,lower extremity numbness or weakness.  Reports surgery in 2015 for ectopic pregnancy, but no other abdominal surgeries.      Past Medical History  Diagnosis Date  . Hypertension   . Deviated septum    Past Surgical History  Procedure Laterality Date  . Ectopic pregnancy surgery  10/2013  . Tonsillectomy    . Tympanostomy     Family History  Problem Relation Age of Onset  . Hypertension Mother   . Hypertension Father   . Hyperlipidemia Father   . Diabetes Brother   . Diabetes Other    Social History  Substance Use Topics  . Smoking status: Former Smoker -- 0.50 packs/day    Types: Cigarettes    Quit date: 06/10/2014  . Smokeless tobacco: Never Used  . Alcohol Use: No   OB History    Gravida Para Term Preterm AB TAB SAB Ectopic Multiple Living   4 3 3  1   1  3      Review of Systems  Constitutional: Negative for fever, chills and appetite change.  Respiratory: Negative for shortness of breath.   Cardiovascular: Negative for chest pain.  Gastrointestinal: Positive for abdominal pain. Negative for nausea, vomiting, diarrhea and blood in stool.  Genitourinary: Negative  for dysuria, frequency, flank pain, decreased urine volume, vaginal bleeding, vaginal discharge and difficulty urinating.  Musculoskeletal: Positive for back pain (right low back).  Skin: Negative for color change and rash.  Neurological: Negative for dizziness, weakness and numbness.  Hematological: Negative for adenopathy.  All other systems reviewed and are negative.     Allergies  Coreg and Hydrocodeine  Home Medications   Prior to Admission medications   Medication Sig Start Date End Date Taking? Authorizing Provider  amLODipine (NORVASC) 10 MG tablet Take 1 tablet (10 mg total) by mouth daily. 12/05/14  Yes Evalee Jefferson, PA-C  Cetirizine HCl (ZYRTEC ALLERGY) 10 MG CAPS Take 10 mg by mouth daily as needed (allergies).   Yes Historical Provider, MD  clonazePAM (KLONOPIN) 1 MG tablet Take 1 mg by mouth 3 (three) times daily as needed for anxiety.   Yes Historical Provider, MD  hydrochlorothiazide (HYDRODIURIL) 12.5 MG tablet Take 2 tablets (25 mg total) by mouth daily. 12/11/14  Yes Erline Hau, MD  lisinopril (PRINIVIL,ZESTRIL) 40 MG tablet Take 40 mg by mouth daily.   Yes Historical Provider, MD  ranitidine (ZANTAC) 150 MG tablet Take 150 mg by mouth daily as needed for heartburn.   Yes Historical Provider, MD   BP 191/132 mmHg  Pulse 105  Temp(Src) 98.6 F (37 C) (Oral)  Resp 18  Ht 5\' 6"  (1.676 m)  Wt 81.647 kg  BMI 29.07  kg/m2  SpO2 99%  LMP 07/04/2015 Physical Exam  Constitutional: She is oriented to person, place, and time. She appears well-developed and well-nourished. No distress.  HENT:  Head: Normocephalic and atraumatic.  Mouth/Throat: Oropharynx is clear and moist.  Cardiovascular: Normal rate, regular rhythm, normal heart sounds and intact distal pulses.   No murmur heard. Pulmonary/Chest: Effort normal and breath sounds normal. No respiratory distress.  Abdominal: Soft. Bowel sounds are normal. She exhibits no distension and no mass. There is  tenderness. There is no rebound and no guarding.  ttp of the right flank, RUQ .  No rebound tenderness, distention or guarding on exam  Musculoskeletal: Normal range of motion. She exhibits no edema.  Neurological: She is alert and oriented to person, place, and time. She exhibits normal muscle tone. Coordination normal.  Skin: Skin is warm and dry.  Psychiatric: She has a normal mood and affect.  Nursing note and vitals reviewed.   ED Course  Procedures (including critical care time) Labs Review Labs Reviewed  URINALYSIS, ROUTINE W REFLEX MICROSCOPIC (NOT AT [facility]) - Abnormal; Notable for the following:    Hgb urine dipstick LARGE (*)    All other components within normal limits  CBC WITH DIFFERENTIAL/PLATELET - Abnormal; Notable for the following:    Hemoglobin 11.2 (*)    All other components within normal limits  URINE CULTURE  PREGNANCY, URINE  COMPREHENSIVE METABOLIC PANEL  LIPASE, BLOOD  URINE MICROSCOPIC-ADD ON    Imaging Review Ct Abdomen Pelvis W Contrast  07/22/2015  CLINICAL DATA:  Back pain beginning 3 days ago, RIGHT lower quadrant pain beginning yesterday, questionable history of kidney stones, hypertension EXAM: CT ABDOMEN AND PELVIS WITH CONTRAST TECHNIQUE: Multidetector CT imaging of the abdomen and pelvis was performed using the standard protocol following bolus administration of intravenous contrast. Sagittal and coronal MPR images reconstructed from axial data set. CONTRAST:  168mL OMNIPAQUE IOHEXOL 300 MG/ML SOLN IV. Dilute oral contrast. COMPARISON:  05/26/2012 FINDINGS: Minimal dependent atelectasis LEFT lower lobe. BILATERAL nonobstructing renal calculi. Mild enhancement of the RIGHT ureteral pelvic junction and proximal RIGHT ureter with adjacent periureteral edema compatible with inflammatory process question urinary tract infection though this could also be seen with prior stone passage. Proximal RIGHT ureter minimally prominent in size but no definite point of  obstruction or ureteral calcification is seen. Potential tiny cyst at upper pole RIGHT kidney 9 mm diameter. Liver, gallbladder, spleen, pancreas, kidneys, and adrenal glands otherwise unremarkable. Normal appendix. Bladder, LEFT ureter, uterus and adnexa unremarkable. Stomach and bowel loops normal appearance. No mass, adenopathy, free air, free fluid or additional inflammatory process. Bones unremarkable. IMPRESSION: BILATERAL nonobstructing small renal calculi. Minimal enlargement of the proximal RIGHT ureter with hyper enhancement and mild periureteral edema extending to the ureteropelvic junction, raising question of infection or potentially prior stone passage ; correlation with urinalysis recommended. Electronically Signed   By: Lavonia Dana M.D.   On: 07/22/2015 10:52   I have personally reviewed and evaluated these images and lab results as part of my medical decision-making.   EKG Interpretation None      MDM   Final diagnoses:  Right flank pain    1033  Pt drinking contrast.  Pain improving after fentanyl.    CT scan and clinical symptoms c/w recent kidney stone. Labs reassuring.  Urine appears clear, culture pending. Vitals stable.  Pt non-toxic appearing.  She is stable for d/c and agrees to PMD f/u or urology f/u.  Rx given for percocet and return  precautions given.   Bufford Lope 07/23/15 2145  Merrily Pew, MD 07/24/15 [place], MD 07/24/15 [phone]

## 2015-07-23 LAB — URINE CULTURE

## 2015-07-27 ENCOUNTER — Other Ambulatory Visit: Payer: Self-pay | Admitting: Urology

## 2015-07-27 ENCOUNTER — Encounter (HOSPITAL_COMMUNITY): Payer: Self-pay | Admitting: *Deleted

## 2015-07-28 NOTE — H&P (Signed)
History of Present Illness Maria Lang is a 35 yo WF who is sent from [facility] for stones. She had been having some mild pain for a few weeks but last week the pain moved into the RLQ and became severe. She had no nausea or fever, just pain. She had a CT that showed a 85mm stone in the right renal pelvis with some obstruction and enhancement of the proximal ureter. She has no voiding complaints or hematuria.  She continues to have some pain today but the severity is variable. She has no prior history of stones. She has had occasional UTI's but has a clear urine today. She has had no GU surgery.   Past Medical History Problems  1. History of Anxiety (F41.9) 2. History of depression (Z86.59) 3. History of esophageal reflux (Z87.19) 4. History of hypertension (Z86.79) 5. History of Obstructive sleep apnea, adult (G47.33) 6. History of Tubal pregnancy (O00.10)  Surgical History Problems  1. History of Myringotomy - With Ventilating Tube Insertion 2. History of Myringotomy - With Ventilating Tube Insertion 3. History of Myringotomy - With Ventilating Tube Insertion 4. History of Salpingectomy 5. History of Tonsillectomy  Current Meds 1. AmLODIPine Besylate 10 MG Oral Tablet;  Therapy: (Recorded:30Jan2017) to Recorded 2. ClonazePAM 1 MG Oral Tablet;  Therapy: (Recorded:30Jan2017) to Recorded 3. HydroCHLOROthiazide 25 MG Oral Tablet;  Therapy: (Recorded:30Jan2017) to Recorded 4. Lisinopril 40 MG Oral Tablet;  Therapy: (Recorded:30Jan2017) to Recorded 5. RaNITidine HCl - 150 MG Oral Capsule;  Therapy: (Recorded:30Jan2017) to Recorded  Allergies Medication  1. Coreg TABS 2. hydrocodone  Family History Problems  1. Family history of Death of family member : Father 2. Family history of diabetes mellitus (Z83.3) : Father, Brother, Grandparent 3. Family history of kidney stones (Z84.1) : Mother 4. Family history of malignant neoplasm (Z80.9) 5. Family history of renal failure (Z84.1) :  Mother  Social History Problems    Denied: History of Alcohol use   Caffeine use (F15.90)   2-3 a day   Former smoker (Z87.891)   Housewife or homemaker   Married   Number of children   2 sons   1 daughter   History of Tobacco use (Z72.0)   smoked 1 ppd x 15 years  Review of Systems  Gastrointestinal: nausea.  Constitutional: feeling tired (fatigue).  Endocrine: polydipsia.  Musculoskeletal: back pain.  Psychiatric: anxiety.    Vitals Vital Signs [Data Includes: Last 1 Day]  Recorded: 30Jan2017 11:49AM  Height: 5 ft 6 in Weight: 187 lb  BMI Calculated: 30.18 BSA Calculated: 1.94 Blood Pressure: 158 / 110 Temperature: 98 F Heart Rate: 80  Physical Exam Constitutional: Well nourished and well developed . No acute distress.  ENT:. The ears and nose are normal in appearance.  Neck: The appearance of the neck is normal and no neck mass is present.  Pulmonary: No respiratory distress and normal respiratory rhythm and effort.  Cardiovascular: Heart rate and rhythm are normal . No peripheral edema.  Abdomen: The abdomen is mildly obese. No masses are palpated. The abdomen is guarding, but not firm, not rigid and no rebound. Moderate tenderness in the RLQ is present and tenderness in the LLQ is present. moderate right CVA tenderness. No hernias are palpable. No hepatosplenomegaly noted.  Lymphatics: The posterior cervical and supraclavicular nodes are not enlarged or tender.  Skin: Normal skin turgor, no visible rash and no visible skin lesions.  Neuro/Psych:. Mood and affect are appropriate.    Results/Data Urine [Data Includes: Last 1 Day]  [id number]  COLOR YELLOW   APPEARANCE CLEAR   SPECIFIC GRAVITY 1.015   pH 6.5   GLUCOSE NEGATIVE   BILIRUBIN NEGATIVE   KETONE NEGATIVE   BLOOD NEGATIVE   PROTEIN NEGATIVE   NITRITE NEGATIVE   LEUKOCYTE ESTERASE NEGATIVE    I have reviewed the ER notes and labs as well as the CT report and films.  KUB today shows  mild lumbar scoliosis. She has 2 stones in the right mid-lower pole consistent with the CT findings. There is retained contrast in the colon that obscures the remainder of the right kidney and a portion of the ureter. There is an ovoid shadow in the right pelvis that could be a stone or phlebolith but it there wasn't one on the CT and it could be bowel content. No other abnormalities are noted.  UA reviewed.    Assessment Assessed  1. Nephrolithiasis (N20.0)  She has a right renal stone that may be rolling in and out of the UPJ. She had right hydro and has persistent pain.   Plan Health Maintenance  1. UA With REFLEX; [Do Not Release]; Status:Resulted - Requires Verification;   [id number] 10:36AM Nephrolithiasis  2. Follow-up Schedule Surgery Office  Follow-up  Status: Hold For - Appointment   Requested for: [id number] 3. KUB; Status:Resulted - Requires Verification;   [id number] 12:34PM  I don't think she is a good candidate for ESWL since the anatomy isn't clear.  I am going to set her up for cystoscopy with right RTG and possible ureteroscopy with holmium and stent. I have reviewed the risks of bleeding, infection, ureteral injury, need for secondary procedures, thrombotic events and anesthetic complications.   Discussion/Summary CC: Dr. Celedonio Savage.

## 2015-07-29 ENCOUNTER — Encounter (HOSPITAL_COMMUNITY)
Admission: RE | Disposition: A | Discharge status: Home / Self Care | Payer: Self-pay | Source: Ambulatory Visit | Attending: Urology

## 2015-07-29 ENCOUNTER — Ambulatory Visit (HOSPITAL_COMMUNITY): Payer: BLUE CROSS/BLUE SHIELD | Admitting: Certified Registered"

## 2015-07-29 ENCOUNTER — Encounter (HOSPITAL_COMMUNITY): Payer: Self-pay | Admitting: *Deleted

## 2015-07-29 ENCOUNTER — Ambulatory Visit (HOSPITAL_COMMUNITY)
Admission: RE | Admit: 2015-07-29 | Discharge: 2015-07-29 | Disposition: A | Discharge status: Home / Self Care | Payer: BLUE CROSS/BLUE SHIELD | Source: Ambulatory Visit | Attending: Urology | Admitting: Urology

## 2015-07-29 DIAGNOSIS — G4733 Obstructive sleep apnea (adult) (pediatric): Secondary | ICD-10-CM | POA: Diagnosis not present

## 2015-07-29 DIAGNOSIS — I10 Essential (primary) hypertension: Secondary | ICD-10-CM | POA: Diagnosis not present

## 2015-07-29 DIAGNOSIS — Z87891 Personal history of nicotine dependence: Secondary | ICD-10-CM | POA: Diagnosis not present

## 2015-07-29 DIAGNOSIS — Z841 Family history of disorders of kidney and ureter: Secondary | ICD-10-CM | POA: Insufficient documentation

## 2015-07-29 DIAGNOSIS — N132 Hydronephrosis with renal and ureteral calculous obstruction: Secondary | ICD-10-CM | POA: Diagnosis not present

## 2015-07-29 DIAGNOSIS — K219 Gastro-esophageal reflux disease without esophagitis: Secondary | ICD-10-CM | POA: Insufficient documentation

## 2015-07-29 DIAGNOSIS — N200 Calculus of kidney: Secondary | ICD-10-CM | POA: Diagnosis present

## 2015-07-29 DIAGNOSIS — Z79899 Other long term (current) drug therapy: Secondary | ICD-10-CM | POA: Insufficient documentation

## 2015-07-29 HISTORY — PX: CYSTOSCOPY WITH RETROGRADE PYELOGRAM, URETEROSCOPY AND STENT PLACEMENT: SHX5789

## 2015-07-29 HISTORY — DX: Acquired absence of other genital organ(s): Z90.79

## 2015-07-29 HISTORY — PX: HOLMIUM LASER APPLICATION: SHX5852

## 2015-07-29 HISTORY — DX: Calculus of kidney: N20.0

## 2015-07-29 SURGERY — CYSTOURETEROSCOPY, WITH RETROGRADE PYELOGRAM AND STENT INSERTION
Anesthesia: General | Laterality: Right

## 2015-07-29 MED ORDER — MEPERIDINE HCL 50 MG/ML IJ SOLN: 6.2500 mg | INTRAMUSCULAR | Status: DC | PRN

## 2015-07-29 MED ORDER — OXYCODONE HCL 5 MG/5ML PO SOLN: 5.0000 mg | Freq: Once | ORAL | Status: DC | PRN

## 2015-07-29 MED ORDER — PHENAZOPYRIDINE HCL 200 MG PO TABS: 200.0000 mg | ORAL_TABLET | Freq: Three times a day (TID) | ORAL | Status: DC | PRN

## 2015-07-29 MED ORDER — MIDAZOLAM HCL 2 MG/2ML IJ SOLN
INTRAMUSCULAR | Status: AC
  Filled 2015-07-29: qty 2

## 2015-07-29 MED ORDER — CEFAZOLIN SODIUM-DEXTROSE 2-3 GM-% IV SOLR
INTRAVENOUS | Status: AC
  Filled 2015-07-29: qty 50

## 2015-07-29 MED ORDER — LISINOPRIL 40 MG PO TABS
40.0000 mg | ORAL_TABLET | Freq: Once | ORAL | Status: AC
  Administered 2015-07-29: 40 mg via ORAL
  Filled 2015-07-29: qty 1

## 2015-07-29 MED ORDER — PROPOFOL 10 MG/ML IV BOLUS
INTRAVENOUS | Status: DC | PRN
  Administered 2015-07-29: 200 mg via INTRAVENOUS

## 2015-07-29 MED ORDER — SODIUM CHLORIDE 0.9 % IR SOLN
Status: DC | PRN
  Administered 2015-07-29: 4000 mL

## 2015-07-29 MED ORDER — FENTANYL CITRATE (PF) 100 MCG/2ML IJ SOLN
INTRAMUSCULAR | Status: AC
  Filled 2015-07-29: qty 2

## 2015-07-29 MED ORDER — DEXAMETHASONE SODIUM PHOSPHATE 10 MG/ML IJ SOLN
INTRAMUSCULAR | Status: AC
  Filled 2015-07-29: qty 1

## 2015-07-29 MED ORDER — ACETAMINOPHEN 10 MG/ML IV SOLN
INTRAVENOUS | Status: AC
  Filled 2015-07-29: qty 100

## 2015-07-29 MED ORDER — HYDROMORPHONE HCL 1 MG/ML IJ SOLN
INTRAMUSCULAR | Status: AC
  Filled 2015-07-29: qty 1

## 2015-07-29 MED ORDER — DEXAMETHASONE SODIUM PHOSPHATE 10 MG/ML IJ SOLN
INTRAMUSCULAR | Status: DC | PRN
  Administered 2015-07-29: 5 mg via INTRAVENOUS

## 2015-07-29 MED ORDER — LIDOCAINE HCL (CARDIAC) 20 MG/ML IV SOLN
INTRAVENOUS | Status: DC | PRN
  Administered 2015-07-29: 80 mg via INTRAVENOUS

## 2015-07-29 MED ORDER — PROMETHAZINE HCL 25 MG/ML IJ SOLN: 6.2500 mg | INTRAMUSCULAR | Status: DC | PRN

## 2015-07-29 MED ORDER — HYDROMORPHONE HCL 1 MG/ML IJ SOLN: 0.2500 mg | INTRAMUSCULAR | Status: DC | PRN

## 2015-07-29 MED ORDER — ACETAMINOPHEN 10 MG/ML IV SOLN
1000.0000 mg | Freq: Once | INTRAVENOUS | Status: AC
  Administered 2015-07-29: 1000 mg via INTRAVENOUS

## 2015-07-29 MED ORDER — PROPOFOL 10 MG/ML IV BOLUS
INTRAVENOUS | Status: AC
  Filled 2015-07-29: qty 20

## 2015-07-29 MED ORDER — MIDAZOLAM HCL 2 MG/2ML IJ SOLN
1.0000 mg | Freq: Once | INTRAMUSCULAR | Status: AC
  Administered 2015-07-29: 1 mg via INTRAVENOUS

## 2015-07-29 MED ORDER — HYDROMORPHONE HCL 1 MG/ML IJ SOLN
0.2500 mg | INTRAMUSCULAR | Status: DC | PRN
  Administered 2015-07-29 (×4): 0.5 mg via INTRAVENOUS

## 2015-07-29 MED ORDER — OXYCODONE HCL 5 MG PO TABS: 5.0000 mg | ORAL_TABLET | Freq: Once | ORAL | Status: DC | PRN

## 2015-07-29 MED ORDER — HYDROCODONE-ACETAMINOPHEN 5-325 MG PO TABS: 1.0000 | ORAL_TABLET | ORAL | Status: DC | PRN

## 2015-07-29 MED ORDER — MEPERIDINE HCL 50 MG/ML IJ SOLN
INTRAMUSCULAR | Status: AC
  Filled 2015-07-29: qty 1

## 2015-07-29 MED ORDER — LACTATED RINGERS IV SOLN
INTRAVENOUS | Status: DC
  Administered 2015-07-29: 12:00:00 via INTRAVENOUS
  Administered 2015-07-29: 1000 mL via INTRAVENOUS

## 2015-07-29 MED ORDER — FENTANYL CITRATE (PF) 100 MCG/2ML IJ SOLN
INTRAMUSCULAR | Status: DC | PRN
  Administered 2015-07-29 (×4): 50 ug via INTRAVENOUS

## 2015-07-29 MED ORDER — IOHEXOL 300 MG/ML  SOLN
INTRAMUSCULAR | Status: DC | PRN
  Administered 2015-07-29: 8 mL via ORAL

## 2015-07-29 MED ORDER — CEFAZOLIN SODIUM-DEXTROSE 2-3 GM-% IV SOLR
2.0000 g | INTRAVENOUS | Status: AC
  Administered 2015-07-29: 2 g via INTRAVENOUS

## 2015-07-29 MED ORDER — LIDOCAINE HCL (CARDIAC) 20 MG/ML IV SOLN
INTRAVENOUS | Status: AC
  Filled 2015-07-29: qty 5

## 2015-07-29 MED ORDER — ONDANSETRON HCL 4 MG/2ML IJ SOLN
INTRAMUSCULAR | Status: DC | PRN
  Administered 2015-07-29 (×4): 2 mg via INTRAVENOUS

## 2015-07-29 MED ORDER — ONDANSETRON HCL 4 MG/2ML IJ SOLN
INTRAMUSCULAR | Status: AC
  Filled 2015-07-29: qty 4

## 2015-07-29 MED ORDER — MIDAZOLAM HCL 5 MG/5ML IJ SOLN
INTRAMUSCULAR | Status: DC | PRN
  Administered 2015-07-29: 2 mg via INTRAVENOUS

## 2015-07-29 MED ORDER — HYDROMORPHONE HCL 1 MG/ML IJ SOLN
0.2500 mg | INTRAMUSCULAR | Status: DC | PRN
  Administered 2015-07-29 (×2): 0.5 mg via INTRAVENOUS

## 2015-07-29 MED ORDER — MEPERIDINE HCL 50 MG/ML IJ SOLN
6.2500 mg | INTRAMUSCULAR | Status: DC | PRN
  Administered 2015-07-29: 6.25 mg via INTRAVENOUS

## 2015-07-29 SURGICAL SUPPLY — 18 items
BAG URO CATCHER STRL LF (MISCELLANEOUS) ×3 IMPLANT
BASKET DAKOTA 1.9FR 11X120 (BASKET) ×2 IMPLANT
CATH URET 5FR 28IN OPEN ENDED (CATHETERS) IMPLANT
CLOTH BEACON ORANGE TIMEOUT ST (SAFETY) ×3 IMPLANT
FIBER LASER FLEXIVA 1000 (UROLOGICAL SUPPLIES) IMPLANT
FIBER LASER FLEXIVA 200 (UROLOGICAL SUPPLIES) ×2 IMPLANT
FIBER LASER FLEXIVA 365 (UROLOGICAL SUPPLIES) IMPLANT
FIBER LASER FLEXIVA 550 (UROLOGICAL SUPPLIES) IMPLANT
FIBER LASER TRAC TIP (UROLOGICAL SUPPLIES) ×2 IMPLANT
GLOVE SURG SS PI 8.0 STRL IVOR (GLOVE) IMPLANT
GOWN STRL REUS W/TWL XL LVL3 (GOWN DISPOSABLE) ×3 IMPLANT
GUIDEWIRE STR DUAL SENSOR (WIRE) IMPLANT
MANIFOLD NEPTUNE II (INSTRUMENTS) ×3 IMPLANT
PACK CYSTO (CUSTOM PROCEDURE TRAY) ×3 IMPLANT
SHEATH ACCESS URETERAL 38CM (SHEATH) ×6 IMPLANT
STENT URET 6FRX24 CONTOUR (STENTS) ×2 IMPLANT
TUBING CONNECTING 10 (TUBING) ×2 IMPLANT
TUBING CONNECTING 10' (TUBING) ×1

## 2015-07-29 NOTE — Anesthesia Preprocedure Evaluation (Signed)
Anesthesia Evaluation  Patient identified by MRN, date of birth, ID band Patient awake    Reviewed: Allergy & Precautions, NPO status , Patient's Chart, lab work & pertinent test results  Airway Mallampati: I  TM Distance: >3 FB Neck ROM: Full    Dental  (+) Teeth Intact, Dental Advisory Given   Pulmonary former smoker,    breath sounds clear to auscultation       Cardiovascular hypertension, Pt. on medications  Rhythm:Regular Rate:Normal     Neuro/Psych    GI/Hepatic GERD  Medicated and Controlled,  Endo/Other    Renal/GU Renal disease     Musculoskeletal   Abdominal   Peds  Hematology   Anesthesia Other Findings   Reproductive/Obstetrics                             Anesthesia Physical Anesthesia Plan  ASA: II  Anesthesia Plan: General   Post-op Pain Management:    Induction: Intravenous  Airway Management Planned: LMA  Additional Equipment:   Intra-op Plan:   Post-operative Plan: Extubation in OR  Informed Consent: I have reviewed the patients History and Physical, chart, labs and discussed the procedure including the risks, benefits and alternatives for the proposed anesthesia with the patient or authorized representative who has indicated his/her understanding and acceptance.   Dental advisory given  Plan Discussed with: CRNA, Anesthesiologist and Surgeon  Anesthesia Plan Comments:         Anesthesia Quick Evaluation

## 2015-07-29 NOTE — Progress Notes (Signed)
Lisinopril 40 mg P.O. Given as ordered.

## 2015-07-29 NOTE — Progress Notes (Signed)
Patient has voided 200 cc light red-colored urine in bedpan

## 2015-07-29 NOTE — Progress Notes (Signed)
Dr. Al Corpus made aware of patient's blood pressures- orders given

## 2015-07-29 NOTE — Interval H&P Note (Signed)
History and Physical Interval Note:  07/29/2015 11:24 AM  Maria Lang  has presented today for surgery, with the diagnosis of RIGHT RENAL STONE WITH HYDRONEPHROSIS   The various methods of treatment have been discussed with the patient and family. After consideration of risks, benefits and other options for treatment, the patient has consented to  Procedure(s): CYSTOSCOPY WITH RIGHT RETROGRADE PYELOGRAM, RIGHT URETEROSCOPY AND STENT PLACEMENT (Right) HOLMIUM LASER APPLICATION (Right) as a surgical intervention .  The patient's history has been reviewed, patient examined, no change in status, stable for surgery.  I have reviewed the patient's chart and labs.  Questions were answered to the patient's satisfaction.     [name]

## 2015-07-29 NOTE — Op Note (Signed)
Maria Lang, Maria Lang NO.:  [id number]  MEDICAL RECORD NO.:  [id number]  LOCATION:  [facility]                         FACILITY:  [facility]  PHYSICIAN:  Marshall Cork. Jeffie Pollock, M.D.    DATE OF BIRTH:  July 09, 1980  DATE OF PROCEDURE:  07/29/2015 DATE OF DISCHARGE:                              OPERATIVE REPORT   PROCEDURE: 1. Cystoscopy with right retrograde pyelogram and interpretation. 2. Right ureteroscopic stone extraction with holmium lasertripsy and     insertion of right double-J stent.  PREOPERATIVE DIAGNOSIS:  Right renal pelvic stone.  POSTOPERATIVE DIAGNOSIS:  Right renal pelvic stone with right lower pole stone as well.  SURGEON:  Marshall Cork. Jeffie Pollock, M.D.  ANESTHESIA:  General.  SPECIMEN:  Stone fragments.  DRAINS:  A 6-French 24 cm contour double-J stent on the right.  BLOOD LOSS:  Minimal.  COMPLICATIONS:  None.  INDICATIONS:  Ms. Deluise is a 35 year old white female with an approximately 6 mm right renal pelvic stone that appears to be popping in and out of the UPJ.  She had some associated inflammatory changes to the renal pelvis and proximal ureter.  There was also a stone in the lower pole.  It was felt that endoscopic management was indicated.  FINDINGS OF PROCEDURE:  She was taken to the operating room.  She was given 2 g of Ancef.  A general anesthetic was induced.  She was placed in lithotomy position.  Her perineum and genitalia were prepped with Betadine solution.  She was draped in usual sterile fashion.  Cystoscopy was performed using a 23-French scope with 30-degree lens. Examination revealed a normal urethra.  The bladder wall was smooth and pale without tumor, stones, or inflammation.  Ureteral orifices were unremarkable.  The right ureteral orifice was cannulated with a 5-French open-end catheter and contrast was instilled.  Right retrograde pyelogram demonstrated a normal distal, mid, and lower proximal ureter; however, just below the UPJ  for approximately 3-4 cm. There was slight irregularity consistent with the findings seen on CT. There was minimal hydronephrosis.  There was a filling defect in the central renal pelvis consistent with the renal pelvic stone and a second filling defect in the lower pole consistent with the lower pole stone.  After retrograde pyelography was performed, a guidewire was passed to the kidney.  The cystoscope was removed and the inner portion of a 12- [place] access sheath was placed to just below the kidney.  The 38 cm access sheath was then reassembled and inserted over the wire to the kidney without difficulty.  The inner core and wire were then removed and the single-lumen digital flexible ureteroscope was then inserted through the access sheath to the renal pelvis.  The stone was identified.  It had flushed back into the mid pole of calyx.  It was engaged with a 200 micron holmium laser fiber, initially set on 0.5 watts and 20 hertz, but eventually the frequency was increased to 50 hertz.  The stone fragmented readily. The fragments were then removed using a [place] basket.  I then also found the stone in the lower pole calyx and this was removed as well.  Once all obvious significant stone fragments had  been removed, final fluoroscopic spot films revealed no residual radiopaque stones.  The ureteroscope was removed and a guidewire was inserted to the kidney. The access sheath was removed.  The cystoscope was reinserted over the wire and a 6-French 24 cm contour double-J stent without string was inserted to the kidney under fluoroscopic guidance.  The wire was removed leaving good coil in the kidney, a good coil in the bladder. The bladder was then drained.  Cystoscope was removed.  The stone fragments were collected and will be given to the patient to bring to the office for analysis.  She was taken down from the lithotomy position.  Her anesthetic was reversed and she was moved to  recovery room in stable condition.  There were no complications.     Marshall Cork. Jeffie Pollock, M.D.     JJW/MEDQ  D:  07/29/2015  T:  07/29/2015  Job:  [id number]

## 2015-07-29 NOTE — Progress Notes (Signed)
Tylenol bolus given as ordered.

## 2015-07-29 NOTE — Anesthesia Procedure Notes (Signed)
Procedure Name: LMA Insertion Date/Time: 07/29/2015 11:59 AM Performed by: Freddie Breech Pre-anesthesia Checklist: Patient identified, Emergency Drugs available, Suction available, Patient being monitored and Timeout performed Patient Re-evaluated:Patient Re-evaluated prior to inductionOxygen Delivery Method: Circle system utilized Preoxygenation: Pre-oxygenation with 100% oxygen Intubation Type: IV induction LMA: LMA inserted LMA Size: 3.0 Tube type: Oral Number of attempts: 1 Airway Equipment and Method: Patient positioned with wedge pillow Placement Confirmation: positive ETCO2,  CO2 detector and breath sounds checked- equal and bilateral Tube secured with: Tape Dental Injury: Teeth and Oropharynx as per pre-operative assessment

## 2015-07-29 NOTE — Anesthesia Postprocedure Evaluation (Signed)
Anesthesia Post Note  Patient: Maria Lang  Procedure(s) Performed: Procedure(s) (LRB): CYSTOSCOPY WITH RIGHT RETROGRADE PYELOGRAM, RIGHT URETEROSCOPY AND STENT PLACEMENT (Right) HOLMIUM LASER APPLICATION (Right)  Patient location during evaluation: PACU Anesthesia Type: General Level of consciousness: awake and alert Pain management: pain level controlled Vital Signs Assessment: post-procedure vital signs reviewed and stable Respiratory status: spontaneous breathing, nonlabored ventilation and respiratory function stable Cardiovascular status: blood pressure returned to baseline and stable Postop Assessment: no signs of nausea or vomiting Anesthetic complications: no    Last Vitals:  Filed Vitals:   07/29/15 1515 07/29/15 1540  BP: 151/111 149/99  Pulse: 105 89  Temp: 37 C 36.6 C  Resp: 22 20    Last Pain:  Filed Vitals:   07/29/15 1612  PainSc: 3                  Ngozi Alvidrez A

## 2015-07-29 NOTE — Progress Notes (Signed)
Dr. Jeffie Pollock made aware of patient's condition and vital signs- aware that patient is going to Short Stay and that Dr. Al Corpus is aware of patient's condition

## 2015-07-29 NOTE — Progress Notes (Addendum)
Patient brought to short stay from PACU crying and complaining of pain. Up to bathroom. 200 ml of urine in bedpan and incontinent of large amount of urine in stretcher. Voided large amount of pink colored urine in bathroom. Patient back to bed (total linen change). Feels much better.  Patient and husband instructed to follow up with primary MD about hypertension. The only time patient has ever seen cardiologist was in ER. They say they will follow up this week.

## 2015-07-29 NOTE — Progress Notes (Signed)
Dr. Jeffie Pollock notified of patient's heart rates and blood pressures- complaints of continual abdominal pain-orders given

## 2015-07-29 NOTE — Progress Notes (Signed)
Dr. Al Corpus made aware of patient's vital signs- medication received in PACU- O.K. To go to Short Stay

## 2015-07-29 NOTE — Progress Notes (Signed)
Patient states she feels some better.

## 2015-07-29 NOTE — Progress Notes (Signed)
Dr. Al Corpus in to see patient- made aware of patient's heart rates and blood pressures - O.K. To go to Short Stay.

## 2015-07-29 NOTE — Discharge Instructions (Addendum)
CYSTOSCOPY HOME CARE INSTRUCTIONS  Activity: Rest for the remainder of the day.  Do not drive or operate equipment today.  You may resume normal activities in one to two days as instructed by your physician.   Meals: Drink plenty of liquids and eat light foods such as gelatin or soup this evening.  You may return to a normal meal plan tomorrow.  Return to Work: You may return to work in one to two days or as instructed by your physician.  Special Instructions / Symptoms: Call your physician if any of these symptoms occur:   -persistent or heavy bleeding  -bleeding which continues after first few urination  -large blood clots that are difficult to pass  -urine stream diminishes or stops completely  -fever equal to or higher than 101 degrees Farenheit.  -cloudy urine with a strong, foul odor  -severe pain  Females should always wipe from front to back after elimination.  You may feel some burning pain when you urinate.  This should disappear with time.  Applying moist heat to the lower abdomen or a hot tub bath may help relieve the pain. \  I will get your f/u appointment changed so either I or one of my partners can remove your stent in the office.   Patient Signature:  ________________________________________________________  Nurse's Signature:  ________________________________________________________     General Anesthesia, Adult, Care After Refer to this sheet in the next few weeks. These instructions provide you with information on caring for yourself after your procedure. Your health care provider may also give you more specific instructions. Your treatment has been planned according to current medical practices, but problems sometimes occur. Call your health care provider if you have any problems or questions after your procedure. WHAT TO EXPECT AFTER THE PROCEDURE After the procedure, it is typical to experience:  Sleepiness.  Nausea and vomiting. HOME CARE  INSTRUCTIONS  For the first 24 hours after general anesthesia:  Have a responsible person with you.  Do not drive a car. If you are alone, do not take public transportation.  Do not drink alcohol.  Do not take medicine that has not been prescribed by your health care provider.  Do not sign important papers or make important decisions.  You may resume a normal diet and activities as directed by your health care provider.  Change bandages (dressings) as directed.  If you have questions or problems that seem related to general anesthesia, call the hospital and ask for the anesthetist or anesthesiologist on call. SEEK MEDICAL CARE IF:  You have nausea and vomiting that continue the day after anesthesia.  You develop a rash. SEEK IMMEDIATE MEDICAL CARE IF:   You have difficulty breathing.  You have chest pain.  You have any allergic problems.   This information is not intended to replace advice given to you by your health care provider. Make sure you discuss any questions you have with your health care provider.   Document Released: 09/18/2000 Document Revised: 07/03/2014 Document Reviewed: 10/11/2011 Elsevier Interactive Patient Education [employer].

## 2015-07-29 NOTE — Brief Op Note (Signed)
07/29/2015  12:28 PM  PATIENT:  Maria Lang  35 y.o. female  PRE-OPERATIVE DIAGNOSIS:  RIGHT RENAL STONE WITH HYDRONEPHROSIS   POST-OPERATIVE DIAGNOSIS:  RIGHT RENAL STONE WITH HYDRONEPHROSIS   PROCEDURE:  Procedure(s): CYSTOSCOPY WITH RIGHT RETROGRADE PYELOGRAM, RIGHT URETEROSCOPY AND STENT PLACEMENT (Right) HOLMIUM LASER APPLICATION (Right)  SURGEON:  Surgeon(s) and Role:    * Irine Seal, MD - Primary  PHYSICIAN ASSISTANT:   ASSISTANTS: none   ANESTHESIA:   general  EBL:  Total I/O In: 1000 [I.V.:1000] Out: -   BLOOD ADMINISTERED:none  DRAINS: right JJ stent   LOCAL MEDICATIONS USED:  NONE  SPECIMEN:  Source of Specimen:  stone fragments.   DISPOSITION OF SPECIMEN:  to patient  COUNTS:  YES  TOURNIQUET:  * No tourniquets in log *  DICTATION: .Other Dictation: Dictation Number [id number]  PLAN OF CARE: Discharge to home after PACU  PATIENT DISPOSITION:  PACU - hemodynamically stable.   Delay start of Pharmacological VTE agent (>24hrs) due to surgical blood loss or risk of bleeding: not applicable

## 2015-07-29 NOTE — Progress Notes (Signed)
Versed 1 mg IVP given as ordered.

## 2015-07-29 NOTE — Transfer of Care (Signed)
Immediate Anesthesia Transfer of Care Note  Patient: Maria Lang  Procedure(s) Performed: Procedure(s): CYSTOSCOPY WITH RIGHT RETROGRADE PYELOGRAM, RIGHT URETEROSCOPY AND STENT PLACEMENT (Right) HOLMIUM LASER APPLICATION (Right)  Patient Location: PACU  Anesthesia Type:General  Level of Consciousness:  sedated, patient cooperative and responds to stimulation  Airway & Oxygen Therapy:Patient Spontanous Breathing and Patient connected to face mask oxgen  Post-op Assessment:  Report given to PACU RN and Post -op Vital signs reviewed and stable  Post vital signs:  Reviewed and stable  Last Vitals:  Filed Vitals:   07/29/15 0950 07/29/15 0959  BP: 173/111 179/116  Pulse: 85   Temp: 37.2 C   Resp: 18     Complications: No apparent anesthesia complications

## 2015-08-16 ENCOUNTER — Ambulatory Visit (INDEPENDENT_AMBULATORY_CARE_PROVIDER_SITE_OTHER): Payer: BLUE CROSS/BLUE SHIELD | Admitting: Cardiology

## 2015-08-16 ENCOUNTER — Encounter: Payer: Self-pay | Admitting: Cardiology

## 2015-08-16 ENCOUNTER — Encounter: Payer: Self-pay | Admitting: *Deleted

## 2015-08-16 VITALS — BP 156/110 | HR 76 | Ht 67.0 in | Wt 190.0 lb

## 2015-08-16 DIAGNOSIS — I5032 Chronic diastolic (congestive) heart failure: Secondary | ICD-10-CM | POA: Diagnosis not present

## 2015-08-16 DIAGNOSIS — I10 Essential (primary) hypertension: Secondary | ICD-10-CM

## 2015-08-16 DIAGNOSIS — G4733 Obstructive sleep apnea (adult) (pediatric): Secondary | ICD-10-CM

## 2015-08-16 MED ORDER — CHLORTHALIDONE 25 MG PO TABS: 25.0000 mg | ORAL_TABLET | Freq: Every day | ORAL | Status: DC

## 2015-08-16 NOTE — Patient Instructions (Addendum)
   Labs for Renin Aldosterone level + ratio, TSH, BMET, Magnesium, Cortisol - do in 2 weeks, around August 30, 2015 - orders given today.  Stop HCTZ  Begin Chlorthalidone 25mg  daily - new sent to [place] today. Continue all other medications.   Referral to Dr. Luan Pulling for sleep apnea. Your physician has requested that you regularly monitor and record your blood pressure readings at home. Please take your readings approximately 2 hours after medication.  Take readings 3 x week for 1 month & bring to next office visit for review. Follow up in  1 month.

## 2015-08-16 NOTE — Progress Notes (Signed)
Patient ID: Maria Lang, female   DOB: [birth year], 35 y.o.   MRN: [id number]     Clinical Summary Maria Lang is a 35 y.o.female seen today as a new patient for the following medical problems. She is referred by Dr Wenda Overland for HTN.   1. HTN -she reports history of HTN starting at age 35. She is unaware of any prior secondary HTN workup. - checks at home daily. Typically 170s/120s - she reports a history of having her tubes tied, and thus has been treated with ACE-I.  - no heavy NSAID use. No EtOH. No tobacco. Reports a history of OSA along with CPAP noncompliance.  - reports itching on coreg, has not been on beta blockers.   2. OSA - reports abnormal sleepy about 2 years ago at [facility]. Previously followed by Dr Redmond Pulling - not using CPAP due to discomfort, machine is broken now.    Past Medical History  Diagnosis Date  . Hypertension   . Deviated septum   . Renal stones   . History of bilateral salpingectomy      Allergies  Allergen Reactions  . Coreg [Carvedilol]     Headache   . Hydrocodeine [Dihydrocodeine] Nausea And Vomiting     Current Outpatient Prescriptions  Medication Sig Dispense Refill  . acetaminophen (TYLENOL) 500 MG tablet Take 500 mg by mouth every 6 (six) hours as needed (Pain).    [name] Kitchen amLODipine (NORVASC) 10 MG tablet Take 1 tablet (10 mg total) by mouth daily. 30 tablet 0  . Cetirizine HCl (ZYRTEC ALLERGY) 10 MG CAPS Take 10 mg by mouth daily as needed (allergies).    . clonazePAM (KLONOPIN) 1 MG tablet Take 1 mg by mouth 3 (three) times daily as needed for anxiety.    . diphenhydramine-acetaminophen (TYLENOL PM) 25-500 MG TABS tablet Take 1 tablet by mouth at bedtime as needed (Sleep).     . hydrochlorothiazide (HYDRODIURIL) 12.5 MG tablet Take 2 tablets (25 mg total) by mouth daily. (Patient taking differently: Take 25 mg by mouth daily. Patient takes 25 mg total per patient) 30 tablet 3  . HYDROcodone-acetaminophen (NORCO/VICODIN) 5-325 MG tablet Take  1 tablet by mouth every 4 (four) hours as needed for moderate pain. 25 tablet 0  . lisinopril (PRINIVIL,ZESTRIL) 40 MG tablet Take 40 mg by mouth daily.    . phenazopyridine (PYRIDIUM) 200 MG tablet Take 1 tablet (200 mg total) by mouth 3 (three) times daily as needed for pain. 15 tablet 1  . ranitidine (ZANTAC) 150 MG tablet Take 150 mg by mouth daily as needed for heartburn.     No current facility-administered medications for this visit.     Past Surgical History  Procedure Laterality Date  . Ectopic pregnancy surgery  10/2013  . Tonsillectomy    . Tympanostomy      x 3  . Nasal septum surgery      doen along with tonsillectomy   . Abdominal hysterectomy      partial, fallopian tubes removed   . Bilateral salpingectomy  07.2016  . Cystoscopy with retrograde pyelogram, ureteroscopy and stent placement Right 07/29/2015    Procedure: CYSTOSCOPY WITH RIGHT RETROGRADE PYELOGRAM, RIGHT URETEROSCOPY AND STENT PLACEMENT;  Surgeon: Irine Seal, MD;  Location: [facility];  Service: Urology;  Laterality: Right;  . Holmium laser application Right [id number]    Procedure: HOLMIUM LASER APPLICATION;  Surgeon: Irine Seal, MD;  Location: [facility];  Service: Urology;  Laterality: Right;     Allergies  Allergen Reactions  .  Coreg [Carvedilol]     Headache   . Hydrocodeine [Dihydrocodeine] Nausea And Vomiting      Family History  Problem Relation Age of Onset  . Hypertension Mother   . Hypertension Father   . Hyperlipidemia Father   . Diabetes Brother   . Diabetes Other      Social History Maria Lang reports that she quit smoking about 14 months ago. Her smoking use included Cigarettes. She smoked 0.50 packs per day. She has never used smokeless tobacco. Maria Lang reports that she does not drink alcohol.   Review of Systems CONSTITUTIONAL: No weight loss, fever, chills, weakness or fatigue.  HEENT: Eyes: No visual loss, blurred vision, double vision or yellow sclerae.No hearing loss,  sneezing, congestion, runny nose or sore throat.  SKIN: No rash or itching.  CARDIOVASCULAR: no chest pain, no palpitations RESPIRATORY: No shortness of breath, cough or sputum.  GASTROINTESTINAL: No anorexia, nausea, vomiting or diarrhea. No abdominal pain or blood.  GENITOURINARY: No burning on urination, no polyuria NEUROLOGICAL: No headache, dizziness, syncope, paralysis, ataxia, numbness or tingling in the extremities. No change in bowel or bladder control.  MUSCULOSKELETAL: No muscle, back pain, joint pain or stiffness.  LYMPHATICS: No enlarged nodes. No history of splenectomy.  PSYCHIATRIC: No history of depression or anxiety.  ENDOCRINOLOGIC: No reports of sweating, cold or heat intolerance. No polyuria or polydipsia.  [name] Kitchen   Physical Examination Filed Vitals:   08/16/15 0916  BP: 156/110  Pulse: 76   Filed Vitals:   08/16/15 0916  Height: 5\' 7"  (1.702 m)  Weight: 190 lb (86.183 kg)    Gen: resting comfortably, no acute distress HEENT: no scleral icterus, pupils equal round and reactive, no palptable cervical adenopathy,  CV: RRR, no m/r/g, no jvd Resp: Clear to auscultation bilaterally GI: abdomen is soft, non-tender, non-distended, normal bowel sounds, no hepatosplenomegaly MSK: extremities are warm, no edema.  Skin: warm, no rash Neuro:  no focal deficits Psych: appropriate affect   Diagnostic Studies 11/2014 echo Study Conclusions  - Left ventricle: The cavity size was normal. Wall thickness was increased in a pattern of mild LVH. Systolic function was normal. The estimated ejection fraction was in the range of 60% to 65%. Doppler parameters are consistent with abnormal left ventricular relaxation (grade 1 diastolic dysfunction).  11/2014 CT PE IMPRESSION: 1. No evidence of pulmonary embolus. 2. Lungs clear bilaterally.  Assessment and Plan   1. Resistant HTN - resistant HTN in young woman with very early onset of HTN, concerning for possible  secondary HTN - will order renin/aldo levels, TSH, cortisol as initial workup. Pending results consider renal artery [place]. - one possible contributing factor is OSA and CPAP noncompliance, will refer to Dr Luan Pulling to assist with machine adjustement - change HCTZ to chlorthalidone for more potent bp effect. She will keep bp log x 1 month. Repeat BMET and Mg in 2 weeks with med change. If HTN persists would start aldactone in setting of resistant HTN at next visit  2.OSA - refer to Dr Luan Pulling         Arnoldo Lenis, M.D.

## 2015-08-24 ENCOUNTER — Telehealth: Payer: Self-pay | Admitting: *Deleted

## 2015-08-24 NOTE — Telephone Encounter (Signed)
Pt says BP has been running high since LOV and concerned. BP last 2 days 147/97 HR 111 181/104 HR 97. C/o of fatigue. Says she did stop HCTZ and began chlorthalidone and will have lab work done next week. Will forward to Dr Harl Bowie

## 2015-08-25 MED ORDER — SPIRONOLACTONE 25 MG PO TABS: 25.0000 mg | ORAL_TABLET | Freq: Every day | ORAL | Status: DC

## 2015-08-25 NOTE — Telephone Encounter (Signed)
Pt agreeable to start aldactone 25 mg daily, Medication sent to pharmacy.

## 2015-08-25 NOTE — Telephone Encounter (Signed)
Have her start aldactone 25mg  daily   Zandra Abts MD

## 2015-09-02 ENCOUNTER — Other Ambulatory Visit: Payer: Self-pay | Admitting: *Deleted

## 2015-09-02 ENCOUNTER — Telehealth: Payer: Self-pay | Admitting: *Deleted

## 2015-09-02 DIAGNOSIS — R7989 Other specified abnormal findings of blood chemistry: Secondary | ICD-10-CM

## 2015-09-02 NOTE — Telephone Encounter (Signed)
Spoke with Gene at [facility] and faxed orders for add on free T4 and regular T3

## 2015-09-02 NOTE — Telephone Encounter (Signed)
-----   Message from Arnoldo Lenis, MD sent at 09/01/2015  3:58 PM EST ----- Labs show elevated TSH, can we add on a free T4 and a regular T3   J BrancH MD

## 2015-09-06 ENCOUNTER — Encounter: Payer: Self-pay | Admitting: *Deleted

## 2015-09-06 ENCOUNTER — Telehealth: Payer: Self-pay | Admitting: Cardiology

## 2015-09-06 NOTE — Telephone Encounter (Signed)
Lab work requested and patient will be contacted once results reviewed by MD.

## 2015-09-06 NOTE — Telephone Encounter (Signed)
Wanting results from lab work

## 2015-09-08 ENCOUNTER — Ambulatory Visit: Payer: BLUE CROSS/BLUE SHIELD | Admitting: Cardiovascular Disease

## 2015-09-08 ENCOUNTER — Encounter: Payer: Self-pay | Admitting: *Deleted

## 2015-09-10 ENCOUNTER — Telehealth: Payer: Self-pay | Admitting: *Deleted

## 2015-09-10 NOTE — Telephone Encounter (Signed)
Renin/aldo results are scanned into chart. Pt aware that thyroid labs are ok. Routed to pcp

## 2015-09-10 NOTE — Telephone Encounter (Signed)
-----   Message from Arnoldo Lenis, MD sent at 09/07/2015 10:58 AM EDT ----- Addition thyroid studies look ok, are her cortisol or renin/aldo levels back yet, they were pending according to this lab report   Zandra Abts MD

## 2015-09-15 ENCOUNTER — Ambulatory Visit: Payer: BLUE CROSS/BLUE SHIELD | Admitting: Cardiology

## 2015-09-20 ENCOUNTER — Encounter: Payer: Self-pay | Admitting: Cardiology

## 2015-09-20 ENCOUNTER — Ambulatory Visit (INDEPENDENT_AMBULATORY_CARE_PROVIDER_SITE_OTHER): Payer: BLUE CROSS/BLUE SHIELD | Admitting: Cardiology

## 2015-09-20 VITALS — BP 166/98 | HR 90 | Ht 67.0 in | Wt 190.4 lb

## 2015-09-20 DIAGNOSIS — I10 Essential (primary) hypertension: Secondary | ICD-10-CM | POA: Diagnosis not present

## 2015-09-20 DIAGNOSIS — I5032 Chronic diastolic (congestive) heart failure: Secondary | ICD-10-CM

## 2015-09-20 DIAGNOSIS — R7309 Other abnormal glucose: Secondary | ICD-10-CM | POA: Diagnosis not present

## 2015-09-20 MED ORDER — LABETALOL HCL 200 MG PO TABS: 200.0000 mg | ORAL_TABLET | Freq: Two times a day (BID) | ORAL | Status: DC

## 2015-09-20 NOTE — Progress Notes (Signed)
Patient ID: Jannelly Laflair, female   DOB: 30-Dec-1980, 35 y.o.   MRN: [id number]     Clinical Summary Ms. Wrubel is a 35 y.o.female seen today for follow up of the following medical problems.   1. HTN -she reports history of HTN starting at age 75. She is unaware of any prior seconday HTN workup. - no heavy NSAID use. No EtOH. No tobacco. Reports a history of OSA along with CPAP noncompliance. She has upcoming appointment with Dr Annye Rusk - since last visit her labs showed normal renal function, elevated TSH at 6.93 with normal free T4 and T3, renin 3, aldo 6, cortisol 5.5.  - last visit we changed HCTZ to chlorthalidone. Bp remained elevated so we added aldactone 25mg  daily on 08/25/15.  - just started on propanolol by pcp on Friday.    2. OSA - reports abnormal sleep study about 2 years ago at [facility]. Previously followed by Dr Redmond Pulling - not using CPAP due to discomfort, machine is broken now.  - last visit we referred to Dr Luan Pulling to assist with her OSA and CPAP issues   Past Medical History  Diagnosis Date  . Hypertension   . Deviated septum   . Renal stones   . History of bilateral salpingectomy      Allergies  Allergen Reactions  . Coreg [Carvedilol]     Headache   . Hydrocodeine [Dihydrocodeine] Nausea And Vomiting     Current Outpatient Prescriptions  Medication Sig Dispense Refill  . acetaminophen (TYLENOL) 500 MG tablet Take 500 mg by mouth every 6 (six) hours as needed (Pain).    [name] Kitchen amLODipine (NORVASC) 10 MG tablet Take 1 tablet (10 mg total) by mouth daily. 30 tablet 0  . Cetirizine HCl (ZYRTEC ALLERGY) 10 MG CAPS Take 10 mg by mouth daily as needed (allergies).    . chlorthalidone (HYGROTON) 25 MG tablet Take 1 tablet (25 mg total) by mouth daily. 30 tablet 6  . clonazePAM (KLONOPIN) 1 MG tablet Take 1 mg by mouth 3 (three) times daily as needed for anxiety.    . diphenhydramine-acetaminophen (TYLENOL PM) 25-500 MG TABS tablet Take 1 tablet by mouth at  bedtime as needed (Sleep).     [name] Kitchen lisinopril (PRINIVIL,ZESTRIL) 40 MG tablet Take 40 mg by mouth daily.    . ranitidine (ZANTAC) 150 MG tablet Take 150 mg by mouth daily as needed for heartburn.    . sertraline (ZOLOFT) 50 MG tablet Take 50 mg by mouth daily.    [name] Kitchen spironolactone (ALDACTONE) 25 MG tablet Take 1 tablet (25 mg total) by mouth daily. 90 tablet 3   No current facility-administered medications for this visit.     Past Surgical History  Procedure Laterality Date  . Ectopic pregnancy surgery  10/2013  . Tonsillectomy    . Tympanostomy      x 3  . Nasal septum surgery      doen along with tonsillectomy   . Abdominal hysterectomy      partial, fallopian tubes removed   . Bilateral salpingectomy  07.2016  . Cystoscopy with retrograde pyelogram, ureteroscopy and stent placement Right 07/29/2015    Procedure: CYSTOSCOPY WITH RIGHT RETROGRADE PYELOGRAM, RIGHT URETEROSCOPY AND STENT PLACEMENT;  Surgeon: Irine Seal, MD;  Location: [facility];  Service: Urology;  Laterality: Right;  . Holmium laser application Right [id number]    Procedure: HOLMIUM LASER APPLICATION;  Surgeon: Irine Seal, MD;  Location: [facility];  Service: Urology;  Laterality: Right;     Allergies  Allergen Reactions  . Coreg [Carvedilol]     Headache   . Hydrocodeine [Dihydrocodeine] Nausea And Vomiting      Family History  Problem Relation Age of Onset  . Hypertension Mother   . Hypertension Father   . Hyperlipidemia Father   . Diabetes Brother   . Diabetes Other      Social History Ms. Gajjar reports that she quit smoking about 15 months ago. Her smoking use included Cigarettes. She started smoking about 17 years ago. She has a 7 pack-year smoking history. She has never used smokeless tobacco. Ms. Giles reports that she does not drink alcohol.   Review of Systems CONSTITUTIONAL: No weight loss, fever, chills, weakness or fatigue.  HEENT: Eyes: No visual loss, blurred vision, double vision or yellow  sclerae.No hearing loss, sneezing, congestion, runny nose or sore throat.  SKIN: No rash or itching.  CARDIOVASCULAR: no chest pain, no palpitations RESPIRATORY: No shortness of breath, cough or sputum.  GASTROINTESTINAL: No anorexia, nausea, vomiting or diarrhea. No abdominal pain or blood.  GENITOURINARY: No burning on urination, no polyuria NEUROLOGICAL: No headache, dizziness, syncope, paralysis, ataxia, numbness or tingling in the extremities. No change in bowel or bladder control.  MUSCULOSKELETAL: No muscle, back pain, joint pain or stiffness.  LYMPHATICS: No enlarged nodes. No history of splenectomy.  PSYCHIATRIC: No history of depression or anxiety.  ENDOCRINOLOGIC: No reports of sweating, cold or heat intolerance. No polyuria or polydipsia.  [name] Kitchen   Physical Examination Filed Vitals:   09/20/15 1321  BP: 166/98  Pulse: 90   Filed Vitals:   09/20/15 1321  Height: 5\' 7"  (1.702 m)  Weight: 190 lb 6.4 oz (86.365 kg)    Gen: resting comfortably, no acute distress HEENT: no scleral icterus, pupils equal round and reactive, no palptable cervical adenopathy,  CV: RRR, no m/r/g, no jvd Resp: Clear to auscultation bilaterally GI: abdomen is soft, non-tender, non-distended, normal bowel sounds, no hepatosplenomegaly MSK: extremities are warm, no edema.  Skin: warm, no rash Neuro:  no focal deficits Psych: appropriate affect   Diagnostic Studies 11/2014 echo Study Conclusions  - Left ventricle: The cavity size was normal. Wall thickness was increased in a pattern of mild LVH. Systolic function was normal. The estimated ejection fraction was in the range of 60% to 65%. Doppler parameters are consistent with abnormal left ventricular relaxation (grade 1 diastolic dysfunction).  11/2014 CT PE IMPRESSION: 1. No evidence of pulmonary embolus. 2. Lungs clear bilaterally.     Assessment and Plan  1. Resistant HTN - resistant HTN in young woman with very early onset  of HTN, concerning for possible secondary HTN - thus far workup document above unremarkable, we will plan for renal artery ultrasound. Stop propanolol and start labetalol 200mg  bid, titrate as need for bp control.  - she will present bp log in 2 weeks  2. OSA - has appointment this week with Dr Luan Pulling, OSA could be playing role in her difficult to control HTN.   F/u 1 month    Arnoldo Lenis, M.D.

## 2015-09-20 NOTE — Patient Instructions (Signed)
Your physician recommends that you schedule a follow-up appointment in: 1 month with Dr. Harl Bowie  Your physician has recommended you make the following change in your medication:   STOP PROPANOLOL   START LABETALOL 200 MG TWICE DAILY  Your physician has requested that you regularly monitor and record your blood pressure readings at home FOR 2 [employer]. Please use the same machine at the same time of day to check your readings and record them to bring to your follow-up visit.  Your physician recommends that you return for lab work [email or web address] - PLEASE FAST FOR BLOOD WORK.  Your physician has requested that you have a renal artery duplex. During this test, an ultrasound is used to evaluate blood flow to the kidneys. Allow one hour for this exam. Do not eat after midnight the day before and avoid carbonated beverages. Take your medications as you usually do.  Thank you for choosing [facility]!!

## 2015-09-23 ENCOUNTER — Ambulatory Visit: Payer: BLUE CROSS/BLUE SHIELD

## 2015-09-23 DIAGNOSIS — I10 Essential (primary) hypertension: Secondary | ICD-10-CM

## 2015-09-27 ENCOUNTER — Telehealth: Payer: Self-pay | Admitting: *Deleted

## 2015-09-27 NOTE — Telephone Encounter (Signed)
-----   Message from Arnoldo Lenis, MD sent at 09/27/2015 12:00 PM EDT ----- Renal US looks good  Zandra Abts MD

## 2015-09-27 NOTE — Telephone Encounter (Signed)
Labs show cholesterol is too high, specifically her triglycerides. Its not at the point we need to start medicines. She needs to work on diet and exercise, cutting back on sweets, candies, pastries, sodas, sweet teas etc.and work toward weight loss to improve her numbers        Maria Abts MD    Pt aware and will work on diet/exercise

## 2015-09-27 NOTE — Telephone Encounter (Signed)
Pt aware, routed to pcp 

## 2015-10-22 ENCOUNTER — Ambulatory Visit (HOSPITAL_COMMUNITY)
Admission: RE | Admit: 2015-10-22 | Discharge: 2015-10-22 | Disposition: A | Discharge status: Home / Self Care | Payer: BLUE CROSS/BLUE SHIELD | Source: Ambulatory Visit | Attending: Preventative Medicine | Admitting: Preventative Medicine

## 2015-10-22 ENCOUNTER — Other Ambulatory Visit (HOSPITAL_COMMUNITY): Payer: Self-pay | Admitting: Preventative Medicine

## 2015-10-22 DIAGNOSIS — M79651 Pain in right thigh: Secondary | ICD-10-CM | POA: Diagnosis present

## 2015-10-22 IMAGING — US US EXTREM LOW VENOUS*R*
1 series · 13 of 24 positions shown · non-contrast
Comparison: None.

CLINICAL DATA: Right thigh pain.



[Series 1: us extrem low venous*right* · 0.08mm/px · 13 of 35 slices shown]
[im 1/35]
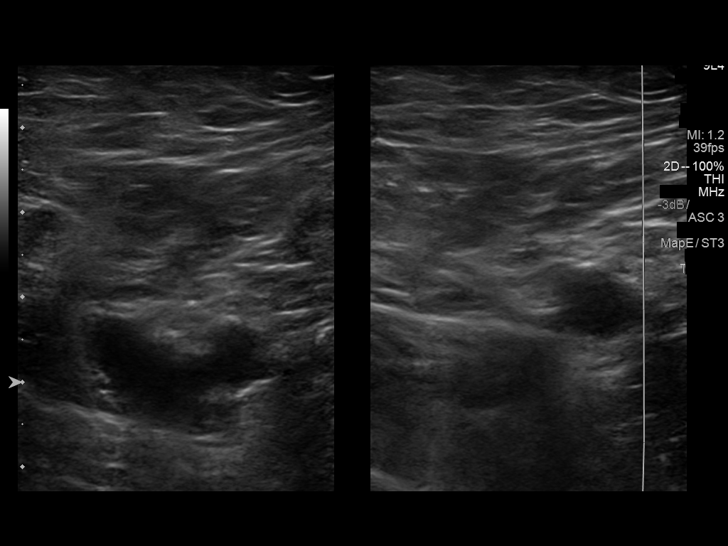
[im 3/35]
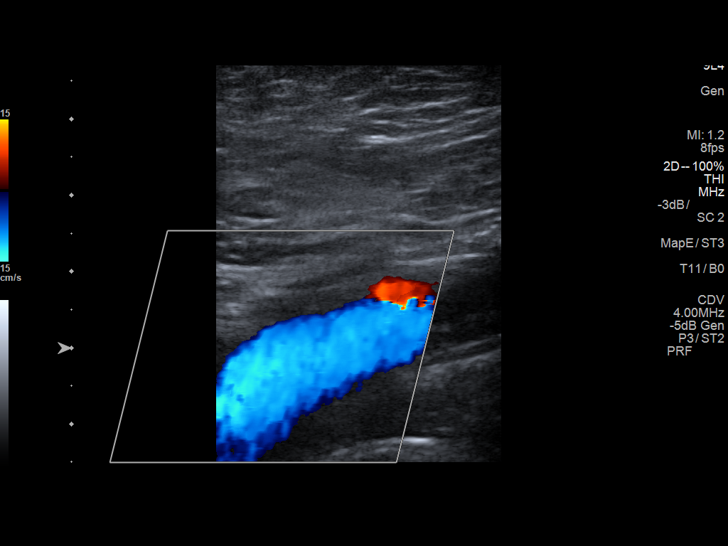
[im 6/35]
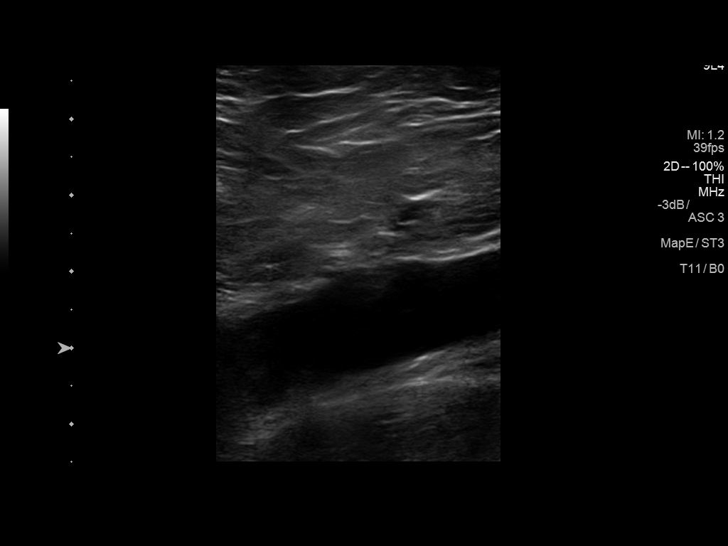
[im 9/35]
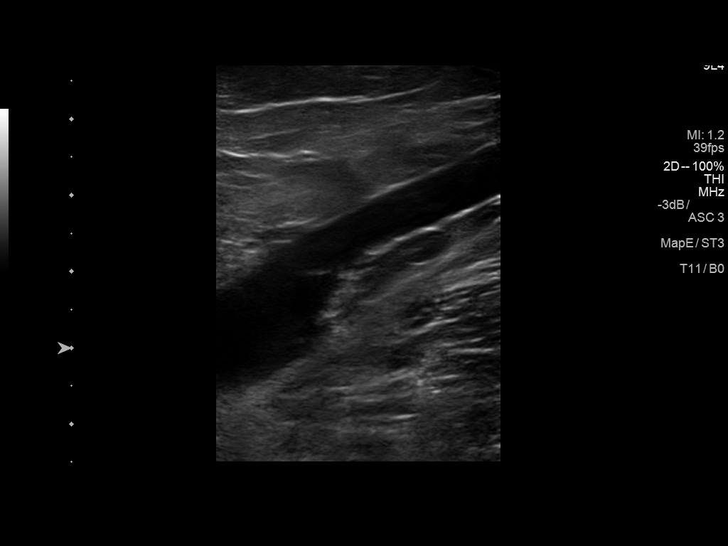
[im 12/35]
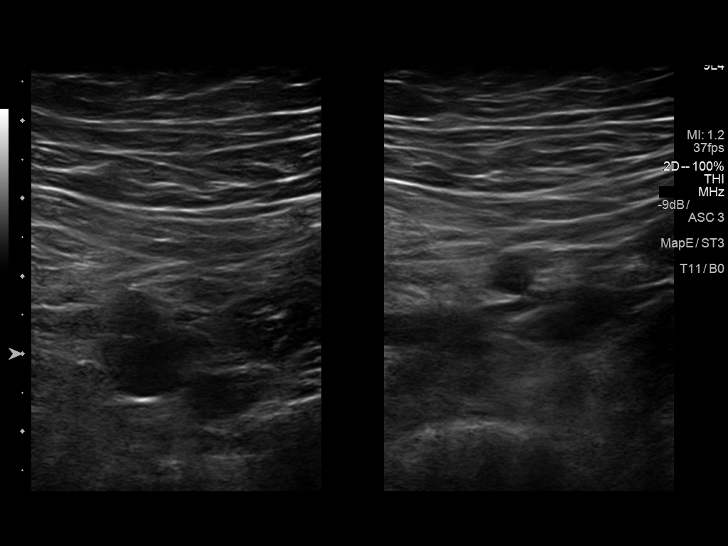
[im 15/35]
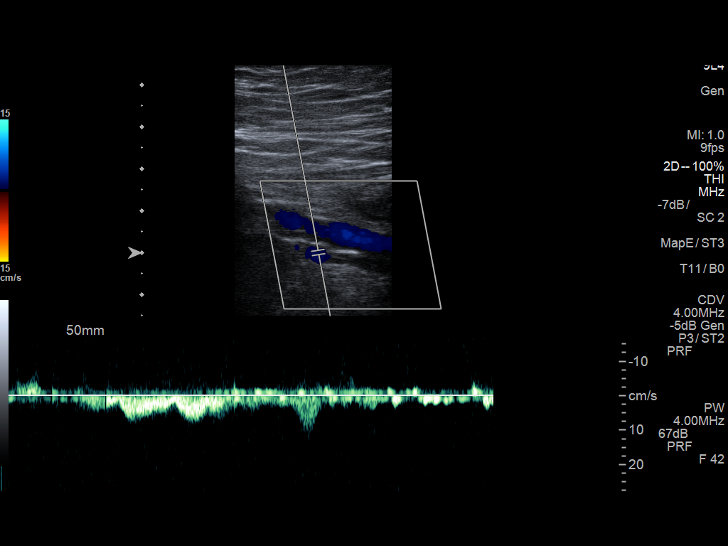
[im 18/35]
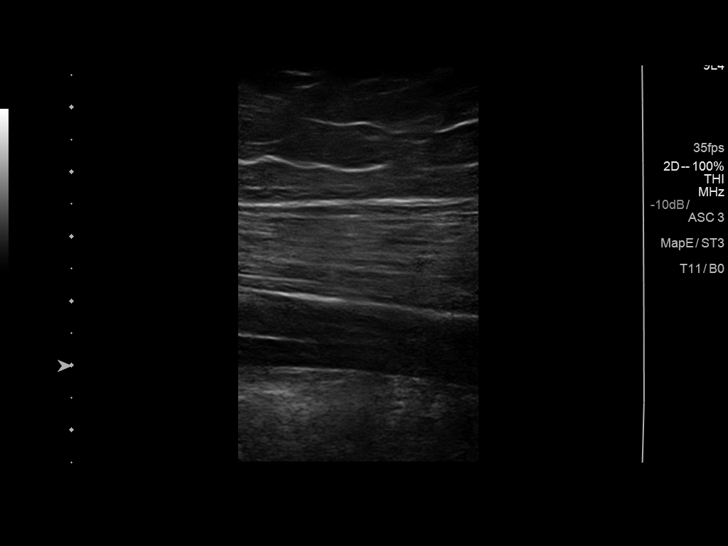
[im 20/35]
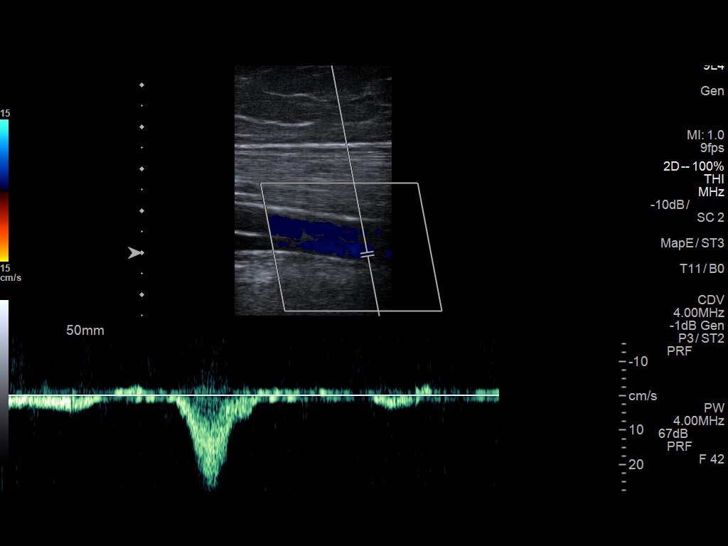
[im 23/35]
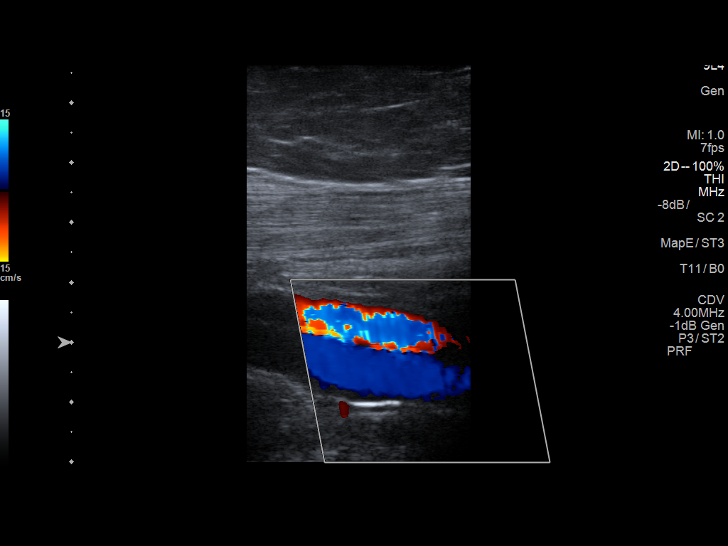
[im 26/35]
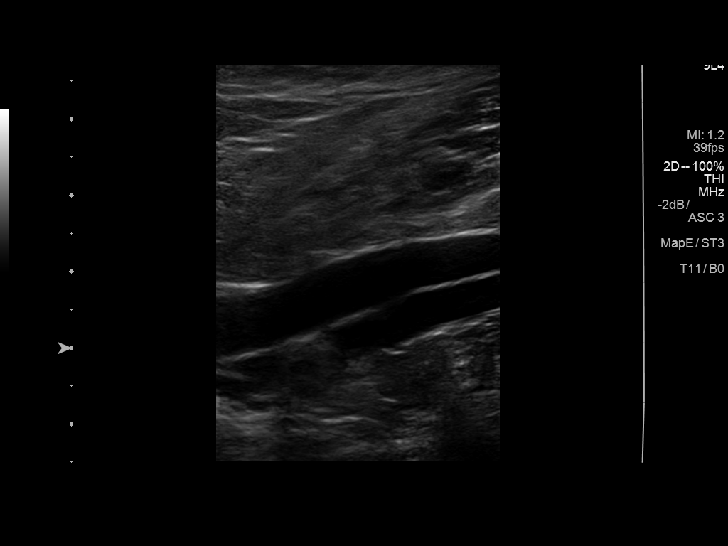
[im 29/35]
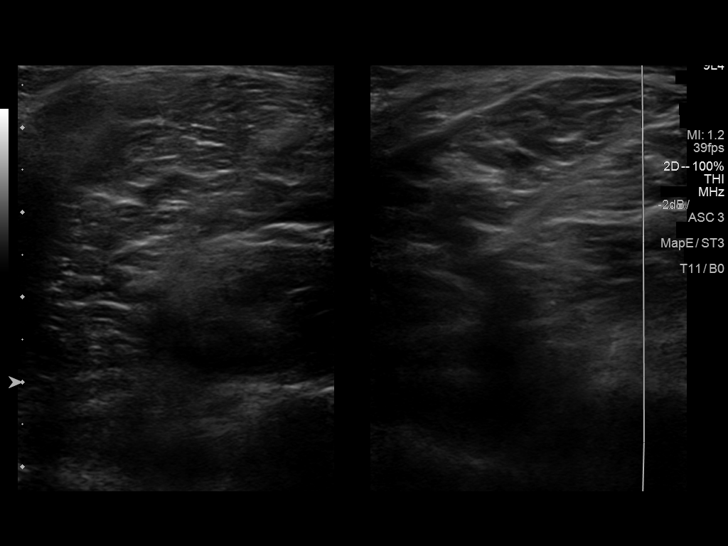
[im 32/35]
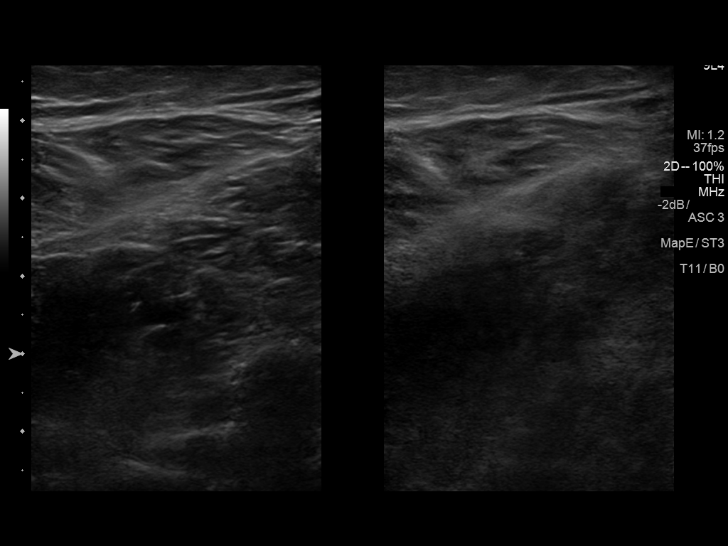
[im 35/35]
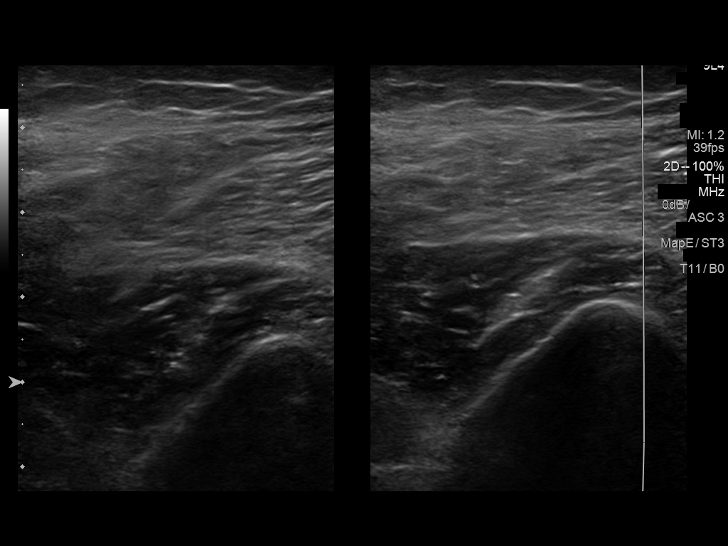

[13 of 24 positions shown; findings below may reference images not displayed]

FINDINGS: Contralateral Common Femoral Vein: Respiratory phasicity is normal
and symmetric with the symptomatic side. No evidence of thrombus.
Normal compressibility.

Common Femoral Vein: No evidence of thrombus. Normal
compressibility, respiratory phasicity and response to augmentation.

Saphenofemoral Junction: No evidence of thrombus. Normal
compressibility and flow on color Doppler imaging.

Profunda Femoral Vein: No evidence of thrombus. Normal
compressibility and flow on color Doppler imaging.

Femoral Vein: No evidence of thrombus. Normal compressibility,
respiratory phasicity and response to augmentation.

Popliteal Vein: No evidence of thrombus. Normal compressibility,
respiratory phasicity and response to augmentation.

Calf Veins: No evidence of thrombus. Normal compressibility and flow
on color Doppler imaging.

Superficial Great Saphenous Vein: No evidence of thrombus. Normal
compressibility and flow on color Doppler imaging.

Venous Reflux:  None.

Other Findings: No evidence of superficial thrombophlebitis or
abnormal fluid collection.
IMPRESSION: No evidence of right lower extremity deep venous thrombosis.

## 2015-10-27 ENCOUNTER — Encounter: Payer: Self-pay | Admitting: Cardiology

## 2015-10-27 ENCOUNTER — Ambulatory Visit: Payer: BLUE CROSS/BLUE SHIELD | Admitting: Cardiology

## 2015-10-27 NOTE — Progress Notes (Unsigned)
Patient ID: Maria Lang, female   DOB: [birth year], 35 y.o.   MRN: [id number]     Clinical Summary Maria Lang is a 35 y.o.female seen today for follow up of the following medical problems  1. HTN -she reports history of HTN starting at age 28. She is unaware of any prior seconday HTN workup. - no heavy NSAID use. No EtOH. No tobacco. Reports a history of OSA along with CPAP noncompliance. She has upcoming appointment with Maria Lang - since last visit her labs showed normal renal function, elevated TSH at 6.93 with normal free T4 and T3, renin 3, aldo 6, cortisol 5.5. Normal renal artery [place].  -    2. OSA - reports abnormal sleep study about 2 years ago at [facility]. Previously followed by Maria Lang - not using CPAP due to discomfort, machine is broken now.  - last visit we referred to Maria Lang to assist with her OSA and CPAP issues  Past Medical History  Diagnosis Date  . Hypertension   . Deviated septum   . Renal stones   . History of bilateral salpingectomy      Allergies  Allergen Reactions  . Coreg [Carvedilol]     Headache   . Hydrocodeine [Dihydrocodeine] Nausea And Vomiting     Current Outpatient Prescriptions  Medication Sig Dispense Refill  . acetaminophen (TYLENOL) 500 MG tablet Take 500 mg by mouth every 6 (six) hours as needed (Pain).    [name] Kitchen amLODipine (NORVASC) 10 MG tablet Take 1 tablet (10 mg total) by mouth daily. 30 tablet 0  . Cetirizine HCl (ZYRTEC ALLERGY) 10 MG CAPS Take 10 mg by mouth daily as needed (allergies).    . chlorthalidone (HYGROTON) 25 MG tablet Take 1 tablet (25 mg total) by mouth daily. 30 tablet 6  . clonazePAM (KLONOPIN) 1 MG tablet Take 1 mg by mouth 3 (three) times daily as needed for anxiety.    . diphenhydramine-acetaminophen (TYLENOL PM) 25-500 MG TABS tablet Take 1 tablet by mouth at bedtime as needed (Sleep).     . labetalol (NORMODYNE) 200 MG tablet Take 1 tablet (200 mg total) by mouth 2 (two) times daily. 60 tablet 3  .  lisinopril (PRINIVIL,ZESTRIL) 40 MG tablet Take 40 mg by mouth daily.    . ranitidine (ZANTAC) 150 MG tablet Take 150 mg by mouth daily as needed for heartburn.    . sertraline (ZOLOFT) 50 MG tablet Take 50 mg by mouth daily.    [name] Kitchen spironolactone (ALDACTONE) 25 MG tablet Take 1 tablet (25 mg total) by mouth daily. 90 tablet 3   No current facility-administered medications for this visit.     Past Surgical History  Procedure Laterality Date  . Ectopic pregnancy surgery  10/2013  . Tonsillectomy    . Tympanostomy      x 3  . Nasal septum surgery      doen along with tonsillectomy   . Abdominal hysterectomy      partial, fallopian tubes removed   . Bilateral salpingectomy  07.2016  . Cystoscopy with retrograde pyelogram, ureteroscopy and stent placement Right 07/29/2015    Procedure: CYSTOSCOPY WITH RIGHT RETROGRADE PYELOGRAM, RIGHT URETEROSCOPY AND STENT PLACEMENT;  Surgeon: Irine Seal, MD;  Location: [facility];  Service: Urology;  Laterality: Right;  . Holmium laser application Right [id number]    Procedure: HOLMIUM LASER APPLICATION;  Surgeon: Irine Seal, MD;  Location: [facility];  Service: Urology;  Laterality: Right;     Allergies  Allergen Reactions  .  Coreg [Carvedilol]     Headache   . Hydrocodeine [Dihydrocodeine] Nausea And Vomiting      Family History  Problem Relation Age of Onset  . Hypertension Mother   . Hypertension Father   . Hyperlipidemia Father   . Diabetes Brother   . Diabetes Other      Social History Maria Lang reports that she quit smoking about 16 months ago. Her smoking use included Cigarettes. She started smoking about 18 years ago. She has a 7 pack-year smoking history. She has never used smokeless tobacco. Maria Lang reports that she does not drink alcohol.   Review of Systems CONSTITUTIONAL: No weight loss, fever, chills, weakness or fatigue.  HEENT: Eyes: No visual loss, blurred vision, double vision or yellow sclerae.No hearing loss, sneezing,  congestion, runny nose or sore throat.  SKIN: No rash or itching.  CARDIOVASCULAR:  RESPIRATORY: No shortness of breath, cough or sputum.  GASTROINTESTINAL: No anorexia, nausea, vomiting or diarrhea. No abdominal pain or blood.  GENITOURINARY: No burning on urination, no polyuria NEUROLOGICAL: No headache, dizziness, syncope, paralysis, ataxia, numbness or tingling in the extremities. No change in bowel or bladder control.  MUSCULOSKELETAL: No muscle, back pain, joint pain or stiffness.  LYMPHATICS: No enlarged nodes. No history of splenectomy.  PSYCHIATRIC: No history of depression or anxiety.  ENDOCRINOLOGIC: No reports of sweating, cold or heat intolerance. No polyuria or polydipsia.  [name] Kitchen   Physical Examination There were no vitals filed for this visit. There were no vitals filed for this visit.  Gen: resting comfortably, no acute distress HEENT: no scleral icterus, pupils equal round and reactive, no palptable cervical adenopathy,  CV Resp: Clear to auscultation bilaterally GI: abdomen is soft, non-tender, non-distended, normal bowel sounds, no hepatosplenomegaly MSK: extremities are warm, no edema.  Skin: warm, no rash Neuro:  no focal deficits Psych: appropriate affect   Diagnostic Studies 11/2014 echo Study Conclusions  - Left ventricle: The cavity size was normal. Wall thickness was increased in a pattern of mild LVH. Systolic function was normal. The estimated ejection fraction was in the range of 60% to 65%. Doppler parameters are consistent with abnormal left ventricular relaxation (grade 1 diastolic dysfunction).  11/2014 CT PE IMPRESSION: 1. No evidence of pulmonary embolus. 2. Lungs clear bilaterally.    Assessment and Plan  1. Resistant HTN - resistant HTN in young woman with very early onset of HTN, concerning for possible secondary HTN - thus far workup document above unremarkable, we will plan for renal artery ultrasound. Stop propanolol and  start labetalol 200mg  bid, titrate as need for bp control.  - she will present bp log in 2 weeks  2. OSA - has appointment this week with Maria Lang, OSA could be playing role in her difficult to control HTN.   F/u 1 month      Arnoldo Lenis, M.D., F.A.C.C.

## 2016-01-18 ENCOUNTER — Encounter (HOSPITAL_COMMUNITY): Payer: Self-pay | Admitting: *Deleted

## 2016-01-18 ENCOUNTER — Emergency Department (HOSPITAL_COMMUNITY)
Admission: EM | Admit: 2016-01-18 | Discharge: 2016-01-18 | Disposition: A | Discharge status: Home / Self Care | Payer: BLUE CROSS/BLUE SHIELD | Attending: Dermatology | Admitting: Dermatology

## 2016-01-18 DIAGNOSIS — Z87891 Personal history of nicotine dependence: Secondary | ICD-10-CM | POA: Diagnosis not present

## 2016-01-18 DIAGNOSIS — Z79899 Other long term (current) drug therapy: Secondary | ICD-10-CM | POA: Diagnosis not present

## 2016-01-18 DIAGNOSIS — I10 Essential (primary) hypertension: Secondary | ICD-10-CM | POA: Insufficient documentation

## 2016-01-18 DIAGNOSIS — R51 Headache: Secondary | ICD-10-CM | POA: Insufficient documentation

## 2016-01-18 DIAGNOSIS — Z5321 Procedure and treatment not carried out due to patient leaving prior to being seen by health care provider: Secondary | ICD-10-CM | POA: Insufficient documentation

## 2016-01-18 DIAGNOSIS — R55 Syncope and collapse: Secondary | ICD-10-CM | POA: Diagnosis not present

## 2016-01-18 NOTE — ED Triage Notes (Signed)
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

## 2016-01-18 NOTE — ED Notes (Signed)
Patient presents to nurses station and states "i just want to go home, ill call my doctor in the morning" patient verbalizes understanding of signing out AMA, and requests to leave at this time. Patient out of department at this time.s

## 2016-05-22 ENCOUNTER — Other Ambulatory Visit: Payer: Self-pay | Admitting: Cardiology

## 2016-05-31 ENCOUNTER — Telehealth: Payer: Self-pay | Admitting: Cardiology

## 2016-05-31 NOTE — Telephone Encounter (Signed)
Numerous attempts thru phone calls and reminder letters to patient.  Unable to reach patient.   New [10]     [System] 11/25/2015 11:04 PM Notification Sent [20]   Weston Anna [id number] 12/07/2015 1:13 PM Notification Sent [20]   Lynnda Child Slaughter [id number] 03/22/2016 10:23 AM Notification Sent [20]   Lynnda Child Slaughter [id number] 05/31/2016 8:06 AM Notification Sent [id number]

## 2016-08-15 ENCOUNTER — Encounter (HOSPITAL_COMMUNITY): Payer: Self-pay | Admitting: *Deleted

## 2016-08-15 ENCOUNTER — Emergency Department (HOSPITAL_COMMUNITY)
Admission: EM | Admit: 2016-08-15 | Discharge: 2016-08-16 | Disposition: A | Discharge status: Home / Self Care | Payer: BLUE CROSS/BLUE SHIELD | Attending: Emergency Medicine | Admitting: Emergency Medicine

## 2016-08-15 DIAGNOSIS — Z79899 Other long term (current) drug therapy: Secondary | ICD-10-CM | POA: Diagnosis not present

## 2016-08-15 DIAGNOSIS — K644 Residual hemorrhoidal skin tags: Secondary | ICD-10-CM | POA: Insufficient documentation

## 2016-08-15 DIAGNOSIS — I5032 Chronic diastolic (congestive) heart failure: Secondary | ICD-10-CM | POA: Insufficient documentation

## 2016-08-15 DIAGNOSIS — K6289 Other specified diseases of anus and rectum: Secondary | ICD-10-CM | POA: Diagnosis present

## 2016-08-15 DIAGNOSIS — I110 Hypertensive heart disease with heart failure: Secondary | ICD-10-CM | POA: Diagnosis not present

## 2016-08-15 DIAGNOSIS — Z87891 Personal history of nicotine dependence: Secondary | ICD-10-CM | POA: Diagnosis not present

## 2016-08-15 DIAGNOSIS — K5901 Slow transit constipation: Secondary | ICD-10-CM

## 2016-08-15 NOTE — ED Triage Notes (Signed)
Pt states she felt like she was constipated and states she had been straining; pt states she has a "ball hanging out of my rectum"; pt is very anxious during triage

## 2016-08-16 ENCOUNTER — Emergency Department (HOSPITAL_COMMUNITY): Payer: BLUE CROSS/BLUE SHIELD

## 2016-08-16 LAB — POC URINE PREG, ED: PREG TEST UR: NEGATIVE

## 2016-08-16 IMAGING — DX DG ABDOMEN ACUTE W/ 1V CHEST
3 series · 3 of 3 positions shown · non-contrast
Comparison: Abdominal radiograph dated [DATE] and CT of the
abdomen pelvis dated [DATE]

CLINICAL DATA: 35-year-old female with abdominal pain.

EXAM:
DG ABDOMEN ACUTE W/ 1V CHEST

[chest pa]
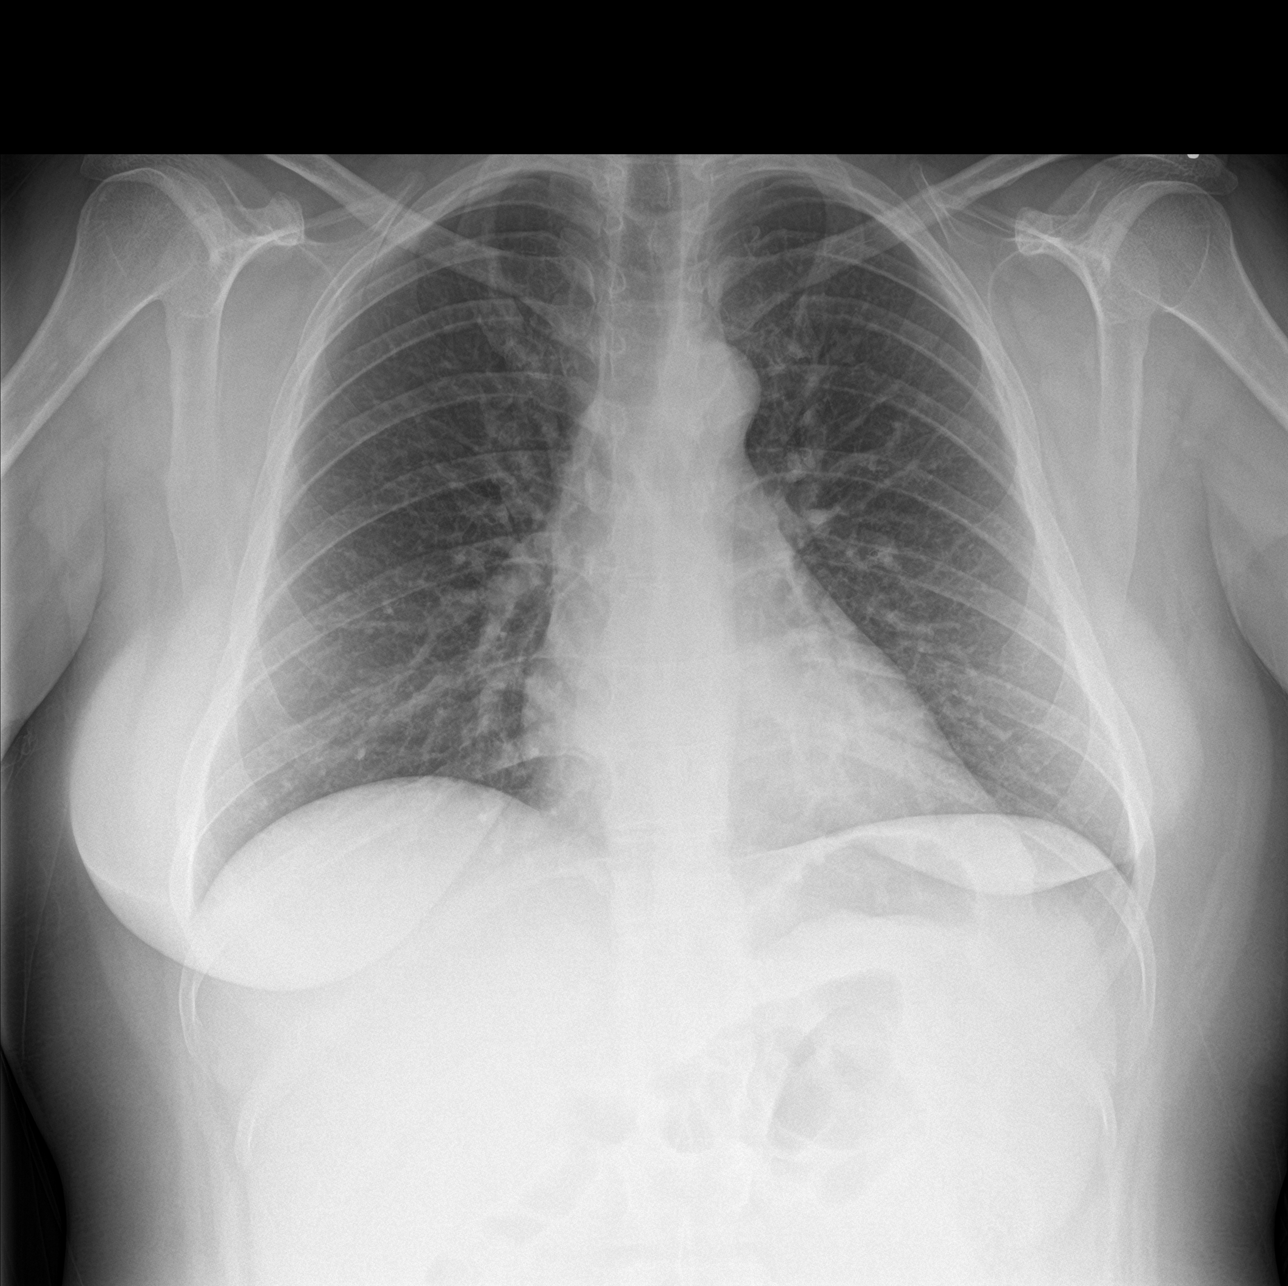

[abdomen erect]
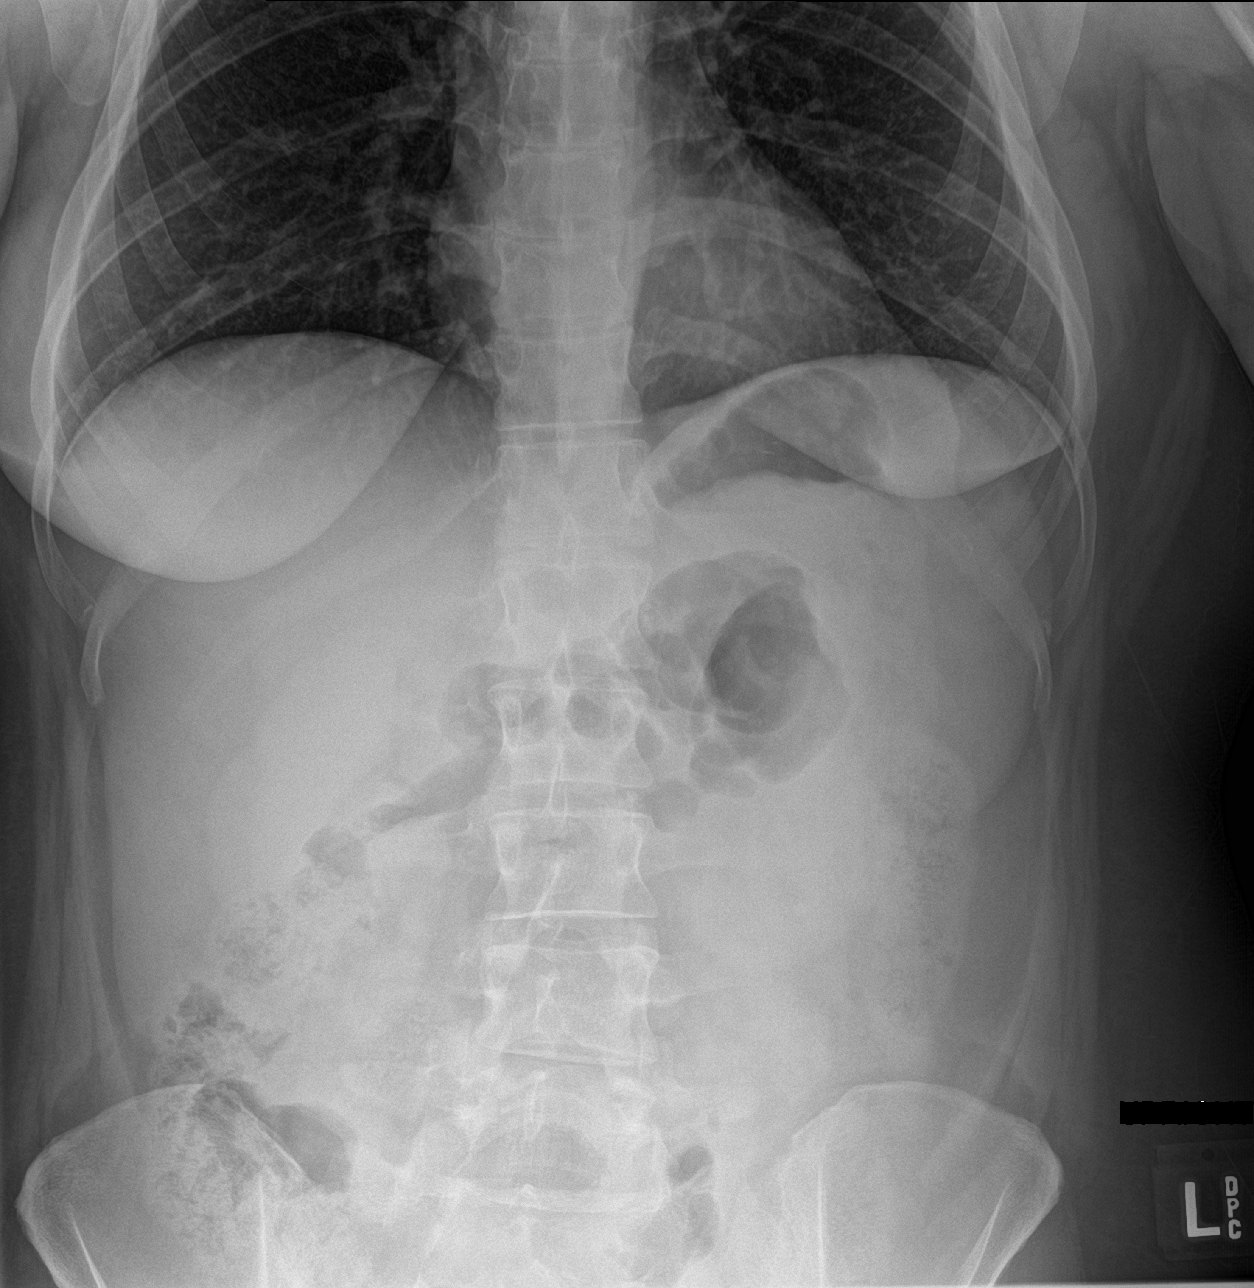

[abdomen supine]
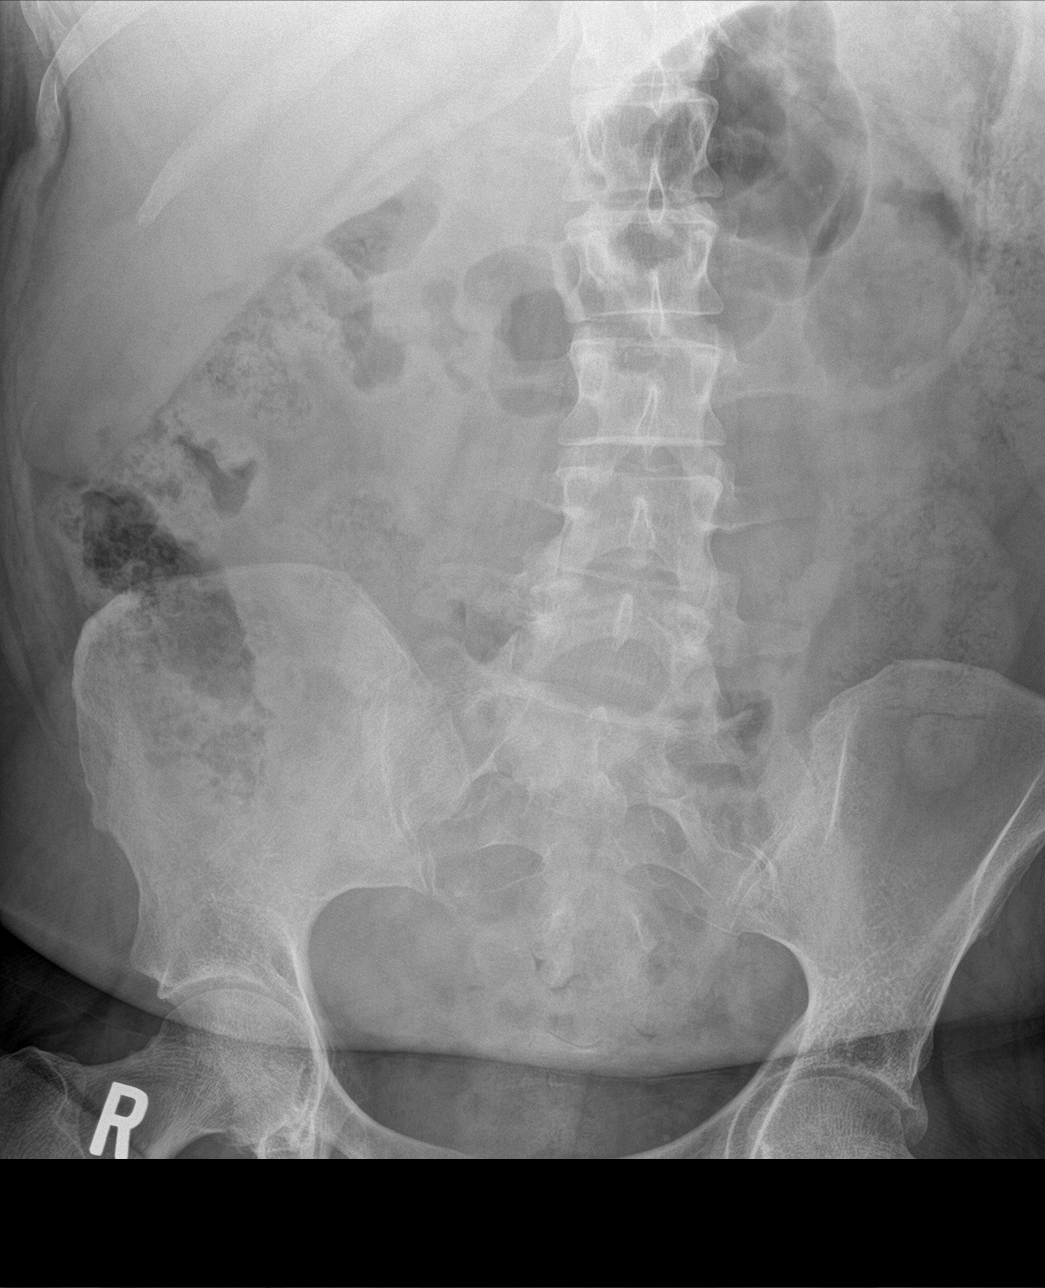

[3 of 3 positions shown; findings below may reference images not displayed]

FINDINGS: The lungs are clear. There is no pleural effusion or pneumothorax.
The cardiac silhouette is within normal limits.

There is moderate stool within the colon. There is no bowel
dilatation or evidence of obstruction. No free air. Small faint
radiopaque calculus over the inferior left renal silhouette measures
approximately 3 mm. The osseous structures and soft tissues are
otherwise unremarkable.
IMPRESSION: 1. No acute cardiopulmonary process.
2. Moderate colonic stool burden.  No bowel obstruction.
3. Probable small left renal stone.

## 2016-08-16 MED ORDER — POLYETHYLENE GLYCOL 3350 17 G PO PACK: 17.0000 g | PACK | Freq: Every day | ORAL | 0 refills | Status: DC

## 2016-08-16 MED ORDER — HYDROCORTISONE 2.5 % RE CREA: TOPICAL_CREAM | RECTAL | 1 refills | Status: DC

## 2016-08-16 MED ORDER — LIDOCAINE VISCOUS 2 % MT SOLN
OROMUCOSAL | Status: DC
Start: 2016-08-16 — End: 2016-08-16
  Filled 2016-08-16: qty 15

## 2016-08-16 MED ORDER — DOCUSATE SODIUM 250 MG PO CAPS: 250.0000 mg | ORAL_CAPSULE | Freq: Every day | ORAL | 0 refills | Status: DC

## 2016-08-16 MED ORDER — LIDOCAINE VISCOUS 2 % MT SOLN
15.0000 mL | Freq: Once | OROMUCOSAL | Status: AC
  Administered 2016-08-16: 15 mL via OROMUCOSAL

## 2016-08-16 NOTE — ED Notes (Signed)
POC preg done at 0038 NEG

## 2016-08-16 NOTE — ED Provider Notes (Signed)
[facility] DEPT Provider Note   CSN: [id number] Arrival date & time: 08/15/16  2319     History   Chief Complaint Chief Complaint  Patient presents with  . Rectal Pain    HPI Maria Lang is a 36 y.o. female.  Patient presents with complaints of a bulging mass next to her anus. She reports that she has been feeling constipated. She has been sitting on the toilet multiple times a day trying to have a bowel movement. Just before coming to the ER she tried to wipe herself and felt a large bulge, became very concerned and immediately came to the ER. She has not had any rectal bleeding.      Past Medical History:  Diagnosis Date  . Deviated septum   . History of bilateral salpingectomy   . Hypertension   . Renal stones     Patient Active Problem List   Diagnosis Date Noted  . Chest pain 12/10/2014  . GERD (gastroesophageal reflux disease) 12/10/2014  . Chronic diastolic CHF (congestive heart failure) ([place]) 12/10/2014  . Hypertension   . Pain in the chest   . Hypertensive emergency     Past Surgical History:  Procedure Laterality Date  . ABDOMINAL HYSTERECTOMY     partial, fallopian tubes removed   . BILATERAL SALPINGECTOMY  07.2016  . CYSTOSCOPY WITH RETROGRADE PYELOGRAM, URETEROSCOPY AND STENT PLACEMENT Right 07/29/2015   Procedure: CYSTOSCOPY WITH RIGHT RETROGRADE PYELOGRAM, RIGHT URETEROSCOPY AND STENT PLACEMENT;  Surgeon: Irine Seal, MD;  Location: [facility];  Service: Urology;  Laterality: Right;  . ECTOPIC PREGNANCY SURGERY  10/2013  . HOLMIUM LASER APPLICATION Right [id number]   Procedure: HOLMIUM LASER APPLICATION;  Surgeon: Irine Seal, MD;  Location: [facility];  Service: Urology;  Laterality: Right;  . NASAL SEPTUM SURGERY     doen along with tonsillectomy   . TONSILLECTOMY    . TYMPANOSTOMY     x 3    OB History    Gravida Para Term Preterm AB Living   4 3 3   1 3    SAB TAB Ectopic Multiple Live Births       1           Home Medications    Prior  to Admission medications   Medication Sig Start Date End Date Taking? Authorizing Provider  acetaminophen (TYLENOL) 500 MG tablet Take 500 mg by mouth every 6 (six) hours as needed (Pain).   Yes Historical Provider, MD  amLODipine (NORVASC) 10 MG tablet Take 1 tablet (10 mg total) by mouth daily. 12/05/14  Yes Evalee Jefferson, PA-C  Cetirizine HCl (ZYRTEC ALLERGY) 10 MG CAPS Take 10 mg by mouth daily as needed (allergies).   Yes Historical Provider, MD  clonazePAM (KLONOPIN) 1 MG tablet Take 1 mg by mouth 3 (three) times daily as needed for anxiety.   Yes Historical Provider, MD  diphenhydramine-acetaminophen (TYLENOL PM) 25-500 MG TABS tablet Take 1 tablet by mouth at bedtime as needed (Sleep).    Yes Historical Provider, MD  lisinopril (PRINIVIL,ZESTRIL) 40 MG tablet Take 40 mg by mouth daily.   Yes Historical Provider, MD  ondansetron (ZOFRAN) 4 MG tablet Take 4 mg by mouth every 8 (eight) hours as needed for nausea or vomiting.   Yes Historical Provider, MD  propranolol (INDERAL) 40 MG tablet Take 90 mg by mouth daily.    Yes Historical Provider, MD  ranitidine (ZANTAC) 150 MG tablet Take 150 mg by mouth daily as needed for heartburn.   Yes Historical  Provider, MD  sertraline (ZOLOFT) 50 MG tablet Take 50 mg by mouth daily.   Yes Historical Provider, MD  zolmitriptan (ZOMIG-ZMT) 2.5 MG disintegrating tablet Take 2.5 mg by mouth as needed for migraine.   Yes Historical Provider, MD  docusate sodium (COLACE) 250 MG capsule Take 1 capsule (250 mg total) by mouth daily. 08/16/16   Orpah Greek, MD  hydrocortisone (ANUSOL-HC) 2.5 % rectal cream Apply rectally 2 times daily 08/16/16   Orpah Greek, MD  labetalol (NORMODYNE) 200 MG tablet Take 1 tablet (200 mg total) by mouth 2 (two) times daily. 09/20/15   Arnoldo Lenis, MD  polyethylene glycol [facility] Floria Raveling) packet Take 17 g by mouth daily. 08/16/16   Orpah Greek, MD    Family History Family History  Problem Relation  Age of Onset  . Hypertension Mother   . Hypertension Father   . Hyperlipidemia Father   . Diabetes Brother   . Diabetes Other     Social History Social History  Substance Use Topics  . Smoking status: Former Smoker    Packs/day: 0.25    Years: 14.00    Types: Cigarettes    Start date: 10/04/1997    Quit date: 06/10/2014  . Smokeless tobacco: Never Used  . Alcohol use No     Allergies   Coreg [carvedilol] and Hydrocodeine [dihydrocodeine]   Review of Systems Review of Systems  Gastrointestinal: Positive for constipation. Negative for anal bleeding.  All other systems reviewed and are negative.    Physical Exam Updated Vital Signs BP 179/89 (BP Location: Left Arm)   Pulse 104   Temp 98.2 F (36.8 C) (Oral)   Resp 20   Ht 5\' 6"  (1.676 m)   Wt 189 lb (85.7 kg)   LMP 07/12/2016   SpO2 97%   BMI 30.51 kg/m   Physical Exam  Constitutional: She is oriented to person, place, and time. She appears well-developed and well-nourished. No distress.  HENT:  Head: Normocephalic and atraumatic.  Right Ear: Hearing normal.  Left Ear: Hearing normal.  Nose: Nose normal.  Mouth/Throat: Oropharynx is clear and moist and mucous membranes are normal.  Eyes: Conjunctivae and EOM are normal. Pupils are equal, round, and reactive to light.  Neck: Normal range of motion. Neck supple.  Cardiovascular: Regular rhythm, S1 normal and S2 normal.  Exam reveals no gallop and no friction rub.   No murmur heard. Pulmonary/Chest: Effort normal and breath sounds normal. No respiratory distress. She exhibits no tenderness.  Abdominal: Soft. Normal appearance and bowel sounds are normal. There is no hepatosplenomegaly. There is no tenderness. There is no rebound, no guarding, no tenderness at McBurney's point and negative Murphy's sign. No hernia.  Genitourinary: Rectal exam shows external hemorrhoid.  Musculoskeletal: Normal range of motion.  Neurological: She is alert and oriented to person,  place, and time. She has normal strength. No cranial nerve deficit or sensory deficit. Coordination normal. GCS eye subscore is 4. GCS verbal subscore is 5. GCS motor subscore is 6.  Skin: Skin is warm, dry and intact. No rash noted. No cyanosis.  Psychiatric: She has a normal mood and affect. Her speech is normal and behavior is normal. Thought content normal.  Nursing note and vitals reviewed.    ED Treatments / Results  Labs (all labs ordered are listed, but only abnormal results are displayed) Labs Reviewed  POC URINE PREG, ED    EKG  EKG Interpretation None  Radiology Dg Abd Acute W/chest  Result Date: 08/16/2016 CLINICAL DATA:  36 year old female with abdominal pain. EXAM: DG ABDOMEN ACUTE W/ 1V CHEST COMPARISON:  Abdominal radiograph dated 06/16/2016 and CT of the abdomen pelvis dated 05/22/2016 FINDINGS: The lungs are clear. There is no pleural effusion or pneumothorax. The cardiac silhouette is within normal limits. There is moderate stool within the colon. There is no bowel dilatation or evidence of obstruction. No free air. Small faint radiopaque calculus over the inferior left renal silhouette measures approximately 3 mm. The osseous structures and soft tissues are otherwise unremarkable. IMPRESSION: 1. No acute cardiopulmonary process. 2. Moderate colonic stool burden.  No bowel obstruction. 3. Probable small left renal stone. Electronically Signed   By: Anner Crete M.D.   On: 08/16/2016 01:15    Procedures Procedures (including critical care time)  Medications Ordered in ED Medications - No data to display   Initial Impression / Assessment and Plan / ED Course  I have reviewed the triage vital signs and the nursing notes.  Pertinent labs & imaging results that were available during my care of the patient were reviewed by me and considered in my medical decision making (see chart for details).     Patient extremely anxious on arrival to the emergency  department because of concern over a bulging mass next to her anus. Examination reveals nonthrombosed hemorrhoid. Patient was reassured, no evidence of any need for intervention. X-ray was performed and does show moderate colonic stool burden without obstruction. Patient will be treated with laxative, stool softener, topical Anusol HC.  Final Clinical Impressions(s) / ED Diagnoses   Final diagnoses:  Slow transit constipation  Hemorrhoids, external without complications    New Prescriptions New Prescriptions   DOCUSATE SODIUM (COLACE) 250 MG CAPSULE    Take 1 capsule (250 mg total) by mouth daily.   HYDROCORTISONE (ANUSOL-HC) 2.5 % RECTAL CREAM    Apply rectally 2 times daily   POLYETHYLENE GLYCOL (MIRALAX / GLYCOLAX) PACKET    Take 17 g by mouth daily.     Orpah Greek, MD 08/16/16 [phone]

## 2016-11-24 ENCOUNTER — Encounter: Payer: Self-pay | Admitting: Physician Assistant

## 2016-12-04 ENCOUNTER — Other Ambulatory Visit (INDEPENDENT_AMBULATORY_CARE_PROVIDER_SITE_OTHER): Payer: BLUE CROSS/BLUE SHIELD

## 2016-12-04 ENCOUNTER — Encounter: Payer: Self-pay | Admitting: Physician Assistant

## 2016-12-04 ENCOUNTER — Ambulatory Visit (INDEPENDENT_AMBULATORY_CARE_PROVIDER_SITE_OTHER): Payer: BLUE CROSS/BLUE SHIELD | Admitting: Physician Assistant

## 2016-12-04 ENCOUNTER — Encounter (INDEPENDENT_AMBULATORY_CARE_PROVIDER_SITE_OTHER): Payer: Self-pay

## 2016-12-04 VITALS — BP 178/122 | HR 84 | Ht 66.0 in | Wt 195.0 lb

## 2016-12-04 DIAGNOSIS — K219 Gastro-esophageal reflux disease without esophagitis: Secondary | ICD-10-CM

## 2016-12-04 DIAGNOSIS — R112 Nausea with vomiting, unspecified: Secondary | ICD-10-CM

## 2016-12-04 LAB — COMPREHENSIVE METABOLIC PANEL
ALBUMIN: 4.9 g/dL (ref 3.5–5.2)
ALK PHOS: 72 U/L (ref 39–117)
ALT: 14 U/L (ref 0–35)
AST: 11 U/L (ref 0–37)
BILIRUBIN TOTAL: 0.4 mg/dL (ref 0.2–1.2)
BUN: 18 mg/dL (ref 6–23)
CO2: 27 mEq/L (ref 19–32)
Calcium: 10 mg/dL (ref 8.4–10.5)
Chloride: 103 mEq/L (ref 96–112)
Creatinine, Ser: 0.73 mg/dL (ref 0.40–1.20)
GFR: 96.9 mL/min (ref >60.00)
Glucose, Bld: 87 mg/dL (ref 70–99)
POTASSIUM: 4.2 meq/L (ref 3.5–5.1)
SODIUM: 137 meq/L (ref 135–145)
TOTAL PROTEIN: 7.8 g/dL (ref 6.0–8.3)

## 2016-12-04 LAB — CBC WITH DIFFERENTIAL/PLATELET
Basophils Absolute: 0 10*3/uL (ref 0.0–0.1)
Basophils Relative: 0.4 % (ref 0.0–3.0)
EOS PCT: 0.1 % (ref 0.0–5.0)
Eosinophils Absolute: 0 10*3/uL (ref 0.0–0.7)
HCT: 35.7 % — ABNORMAL LOW (ref 36.0–46.0)
HEMOGLOBIN: 11.7 g/dL — AB (ref 12.0–15.0)
Lymphocytes Relative: 35.5 % (ref 12.0–46.0)
Lymphs Abs: 3.4 10*3/uL (ref 0.7–4.0)
MCHC: 32.8 g/dL (ref 30.0–36.0)
MCV: 80.7 fl (ref 78.0–100.0)
MONOS PCT: 7.4 % (ref 3.0–12.0)
Monocytes Absolute: 0.7 10*3/uL (ref 0.1–1.0)
Neutro Abs: 5.5 10*3/uL (ref 1.4–7.7)
Neutrophils Relative %: 56.6 % (ref 43.0–77.0)
Platelets: 332 10*3/uL (ref 150.0–400.0)
RBC: 4.43 Mil/uL (ref 3.87–5.11)
RDW: 17.3 % — ABNORMAL HIGH (ref 11.5–15.5)
WBC: 9.7 10*3/uL (ref 4.0–10.5)

## 2016-12-04 MED ORDER — ONDANSETRON HCL 4 MG PO TABS: ORAL_TABLET | ORAL | 4 refills | Status: DC

## 2016-12-04 MED ORDER — RANITIDINE HCL 150 MG PO TABS: 150.0000 mg | ORAL_TABLET | Freq: Two times a day (BID) | ORAL | 6 refills | Status: DC

## 2016-12-04 NOTE — Progress Notes (Signed)
Reviewed and agree with management plan. If BP is not adequately controlled on day of EGD it may be cancelled.   Pricilla Riffle. Fuller Plan, MD [facility]

## 2016-12-04 NOTE — Patient Instructions (Addendum)
Please go to the basement level to have your labs drawn.   You have been scheduled for an abdominal ultrasound at [facility]  on Thursday 6-14 at 11:30 am. Please arrive at 11:15 amfor registration. Make certain not to have anything to eat or drink 6 hours prior to your appointment. Should you need to reschedule your appointment, please contact radiology at [phone]. This test typically takes about 30 minutes to perform.  You have been scheduled for an endoscopy. Please follow written instructions given to you at your visit today. If you use inhalers (even only as needed), please bring them with you on the day of your procedure. Your physician has requested that you go to www.startemmi.com and enter the access code given to you at your visit today. This web site gives a general overview about your procedure. However, you should still follow specific instructions given to you by our office regarding your preparation for the procedure.  We sent refills to Zofran 4 mg and Zantac 150 mg to [facility],  Call your Primary Care for advise on the Blood Pressure.  Today your reading was 178/122.   Call the Cardiologist you saw previously for an appointment as soon as possible.

## 2016-12-04 NOTE — Progress Notes (Signed)
Subjective:    Patient ID: Maria Lang, female    DOB: [birth year], 36 y.o.   MRN: [id number]  HPI Maria Lang is a pleasant 36 year old white female, self referred today for evaluation of recurrent nausea and vomiting. Patient has not had prior GI evaluation though states she was hospitalized and [facility] in January 2018 for nausea and vomiting for about 3 days. She says she did not see a GI physician and no definite diagnosis was made. Her family physician was Dr. Lilian Kapur, who unexpectedly passed away a couple of weeks ago. Patient states that she has had her current symptoms over the past year and has had multiple repeated episodes of acute nausea and vomiting. She says she is having at least one or 2 episodes every week and sometimes more. In between these episodes she feels fine. She says she will get nauseated out of the blue and this doesn't seem to be dependent on whether she has eaten, what she has eaten or whether she has an empty stomach. She is generally not having abdominal pain other than discomfort after multiple episodes of vomiting to the point of dry heaves. She says once these episodes starts she always has multiple episodes of vomiting. She denies any fever or chills, no diarrhea melena or hematochezia. Again she is unaware of any specific triggers. She denies any significant stress. She does endorse having very frequent heartburn and has been on ranitidine twice a day which she says worked pretty well. No dysphagia. She denies any marijuana use. CT of the abdomen and pelvis had been done in January 2017 showing bilateral nonobstructing kidney stones and mild enlargement of the right ureter with mild periureteral edema. She says she not had any recent problems with kidney stones. Patient does have severe hypertension. She says she has been checking her blood pressure at home and it has been running similarly high. He is on multiple drugs for hypertension and says she has been  taking all of her medications. She had seen a cardiologist in [place] a year or so ago but has not followed up. No recent imaging or labs.  Review of Systems Pertinent positive and negative review of systems were noted in the above HPI section.  All other review of systems was otherwise negative.  Outpatient Encounter Prescriptions as of 12/04/2016  Medication Sig  . amLODipine (NORVASC) 10 MG tablet Take 1 tablet (10 mg total) by mouth daily.  [name] Kitchen lisinopril (PRINIVIL,ZESTRIL) 40 MG tablet Take 40 mg by mouth daily.  . ondansetron (ZOFRAN) 4 MG tablet Take 1 tab every 6 hours as needed for nausea.  . pantoprazole (PROTONIX) 40 MG tablet Take 40 mg by mouth 2 (two) times daily.  . propranolol (INDERAL) 40 MG tablet Take 90 mg by mouth daily.   . sertraline (ZOLOFT) 50 MG tablet Take 50 mg by mouth daily.  [name] Kitchen zolmitriptan (ZOMIG-ZMT) 2.5 MG disintegrating tablet Take 2.5 mg by mouth as needed for migraine.  . [DISCONTINUED] acetaminophen (TYLENOL) 500 MG tablet Take 500 mg by mouth every 6 (six) hours as needed (Pain).  . [DISCONTINUED] Cetirizine HCl (ZYRTEC ALLERGY) 10 MG CAPS Take 10 mg by mouth daily as needed (allergies).  . [DISCONTINUED] clonazePAM (KLONOPIN) 1 MG tablet Take 1 mg by mouth 3 (three) times daily as needed for anxiety.  . [DISCONTINUED] diphenhydramine-acetaminophen (TYLENOL PM) 25-500 MG TABS tablet Take 1 tablet by mouth at bedtime as needed (Sleep).   . [DISCONTINUED] labetalol (NORMODYNE) 200 MG tablet Take 1 tablet (  200 mg total) by mouth 2 (two) times daily.  . [DISCONTINUED] ondansetron (ZOFRAN) 4 MG tablet Take 4 mg by mouth every 8 (eight) hours as needed for nausea or vomiting.  . [DISCONTINUED] ranitidine (ZANTAC) 150 MG tablet Take 150 mg by mouth daily as needed for heartburn.  . docusate sodium (COLACE) 250 MG capsule Take 1 capsule (250 mg total) by mouth daily. (Patient not taking: Reported on 12/04/2016)  . hydrocortisone (ANUSOL-HC) 2.5 % rectal cream Apply  rectally 2 times daily (Patient not taking: Reported on 12/04/2016)  . polyethylene glycol (MIRALAX / GLYCOLAX) packet Take 17 g by mouth daily. (Patient not taking: Reported on 12/04/2016)  . ranitidine (ZANTAC) 150 MG tablet Take 1 tablet (150 mg total) by mouth 2 (two) times daily.   No facility-administered encounter medications on file as of 12/04/2016.    Allergies  Allergen Reactions  . Coreg [Carvedilol]     Headache   . Hydrocodeine [Dihydrocodeine] Nausea And Vomiting   Patient Active Problem List   Diagnosis Date Noted  . Chest pain 12/10/2014  . GERD (gastroesophageal reflux disease) 12/10/2014  . Chronic diastolic CHF (congestive heart failure) ([place]) 12/10/2014  . Hypertension   . Pain in the chest   . Hypertensive emergency    Social History   Social History  . Marital status: Married    Spouse name: N/A  . Number of children: N/A  . Years of education: N/A   Occupational History  . Not on file.   Social History Main Topics  . Smoking status: Former Smoker    Packs/day: 0.25    Years: 14.00    Types: Cigarettes    Start date: 10/04/1997    Quit date: 06/10/2014  . Smokeless tobacco: Never Used  . Alcohol use No  . Drug use: No  . Sexual activity: Yes    Birth control/ protection: None   Other Topics Concern  . Not on file   Social History Narrative  . No narrative on file    Maria Lang's family history includes Diabetes in her brother and other; Hyperlipidemia in her father; Hypertension in her father and mother.      Objective:    Vitals:   12/04/16 1104 12/04/16 1155  BP: (!) 178/120 (!) 178/122  Pulse: 84     Physical Exam well-developed young white female in no acute distress, pleasant, anxious, blood pressure 178/120, height 5 foot 6, weight 195, BMI of 31.4. HEENT; nontraumatic normocephalic EOMI PERRLA sclera anicteric, Cardiovascular; regular rate and rhythm with S1-S2 no murmur rub or gallop, Pulmonary; clear bilaterally, Abdomen;  soft nontender nondistended bowel sounds are active there is no palpable mass or hepatosplenomegaly, Rectal ;exam not done, Extremities; no clubbing cyanosis or edema skin warm and dry, Neuropsych; mood and affect appropriate       Assessment & Plan:   #22 36 year old white female with recurrent episodic nausea and vomiting 1 year. Etiology not clear. Rule out gallbladder disease, rule out cyclic vomiting type syndrome. Rule out peptic ulcer disease or chronic gastropathy #2 GERD #3 severe hypertension-patient on multiple drug therapy  Plan; CBC with differential, CMET, CRP, Schedule for upper abdominal ultrasound Schedule for upper endoscopy with Dr. Fuller Plan. Procedure discussed in detail with patient including risks and benefits and she is agreeable to proceed. She will continue ranitidine 150 mg by mouth twice a day, refills sent Continue Zofran 4 mg every 6 hours when necessary refills sent Discussed need for urgent evaluation for her severe hypertension. She  is instructed to call her primary care practice today for advice and to get in with one of the other providers. I also strongly encouraged her to call her cardiologist in [place]  and get an appointment to be seen there ASAP as she appears to have very refractory hypertension.   Amy Genia Harold PA-C 12/04/2016   Cc: Celedonio Savage, MD

## 2016-12-07 ENCOUNTER — Ambulatory Visit (HOSPITAL_COMMUNITY)
Admission: RE | Admit: 2016-12-07 | Discharge: 2016-12-07 | Disposition: A | Discharge status: Home / Self Care | Payer: BLUE CROSS/BLUE SHIELD | Source: Ambulatory Visit | Attending: Physician Assistant | Admitting: Physician Assistant

## 2016-12-07 DIAGNOSIS — R112 Nausea with vomiting, unspecified: Secondary | ICD-10-CM | POA: Insufficient documentation

## 2016-12-07 DIAGNOSIS — K219 Gastro-esophageal reflux disease without esophagitis: Secondary | ICD-10-CM | POA: Diagnosis present

## 2016-12-07 DIAGNOSIS — K760 Fatty (change of) liver, not elsewhere classified: Secondary | ICD-10-CM | POA: Insufficient documentation

## 2016-12-07 IMAGING — US US ABDOMEN COMPLETE
1 series · 14 of 25 positions shown · non-contrast
Comparison: [DATE]

CLINICAL DATA: Nausea and vomiting

EXAM:
ABDOMEN ULTRASOUND COMPLETE

[Series 1: us abdomen complete · 0.25mm/px · 14 of 116 slices shown]
[im 1/116]
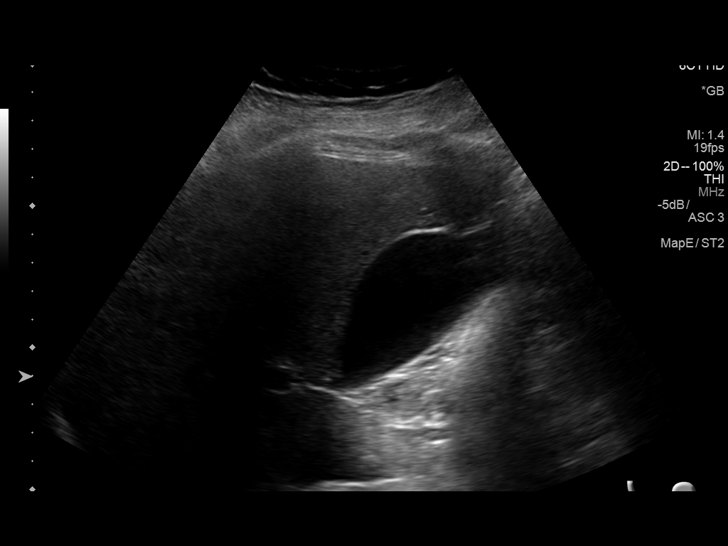
[im 10/116]
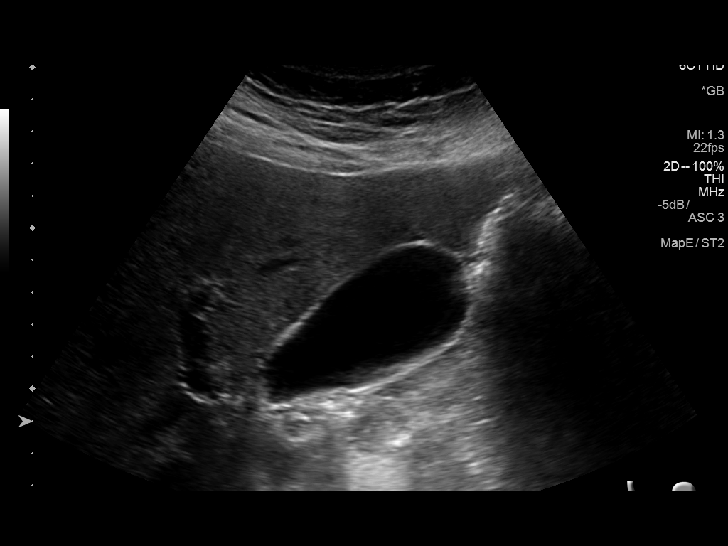
[im 20/116]
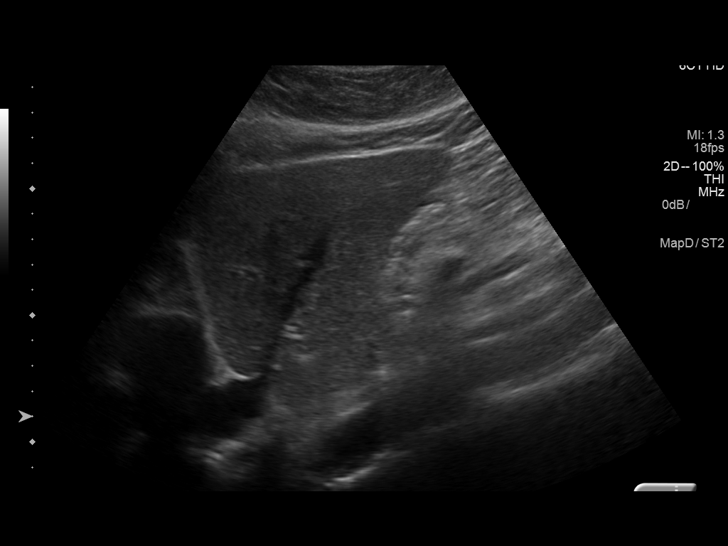
[im 29/116]
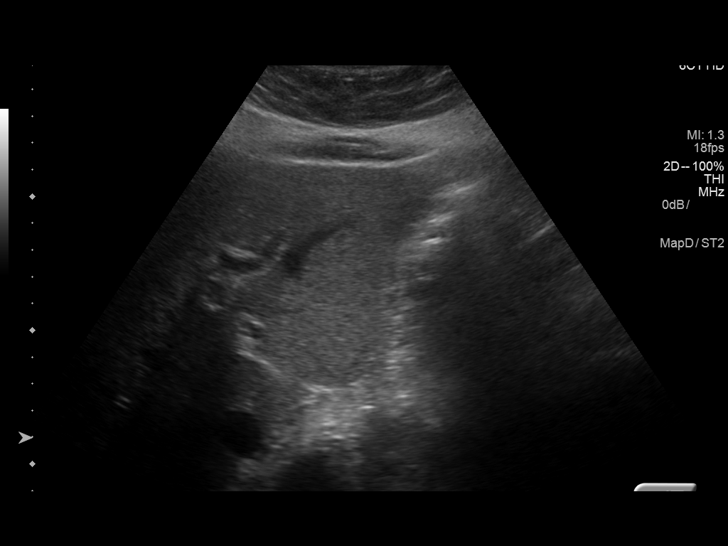
[im 39/116]
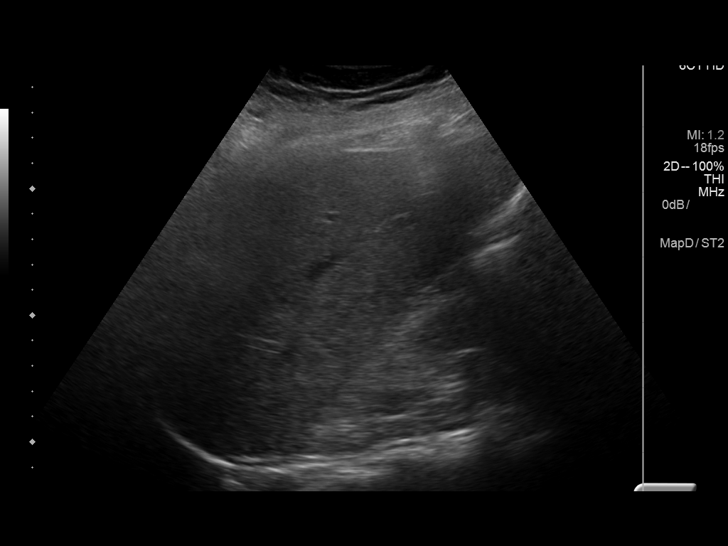
[im 44/116]
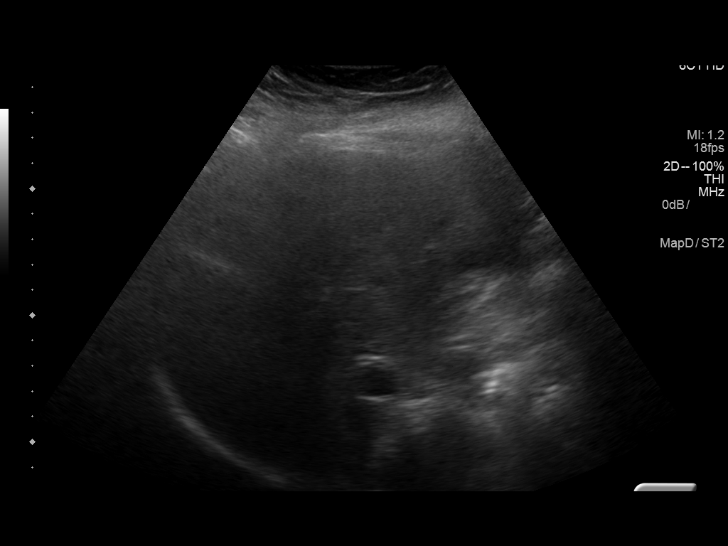
[im 53/116]
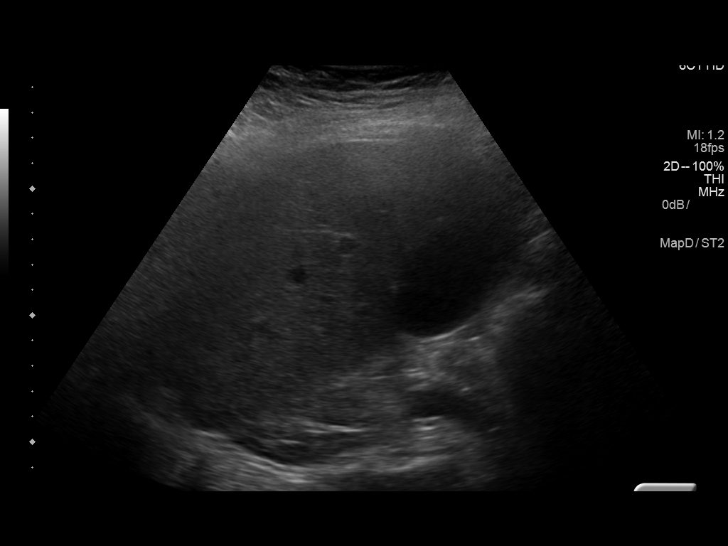
[im 63/116]
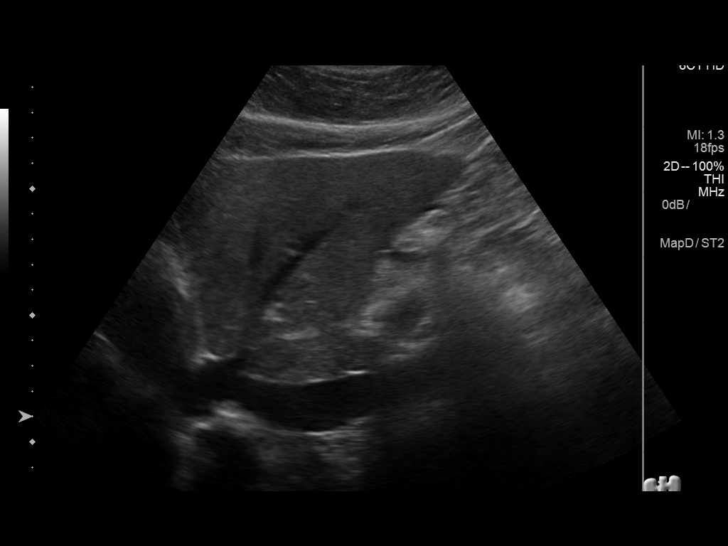
[im 72/116]
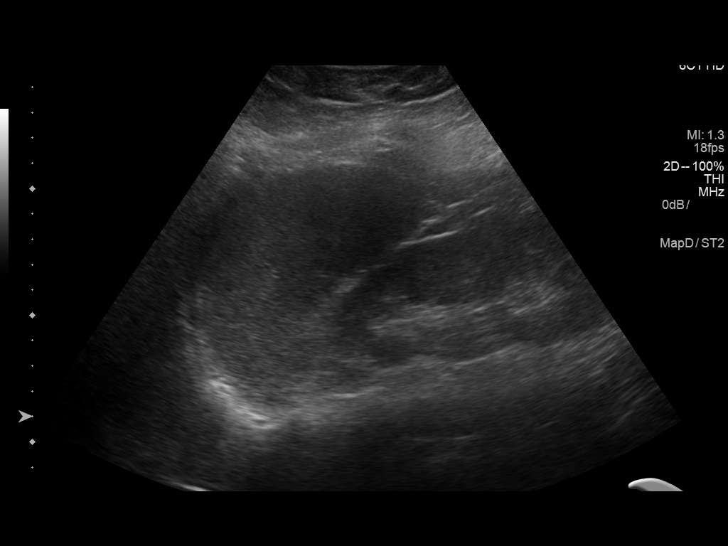
[im 77/116]
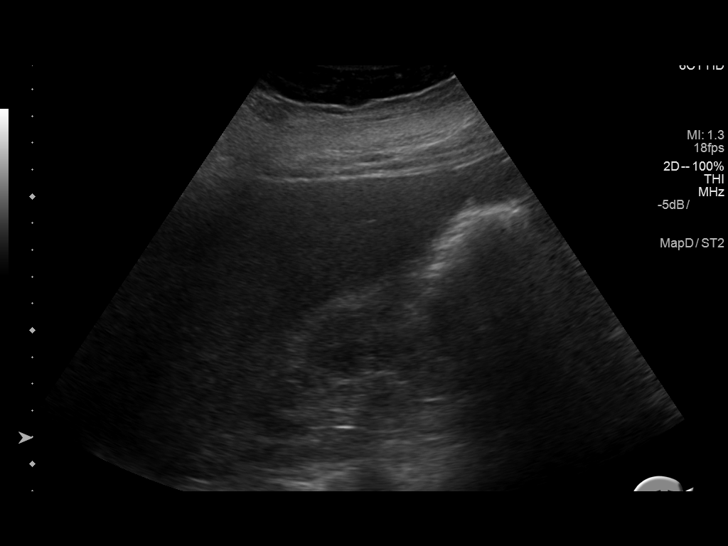
[im 87/116]
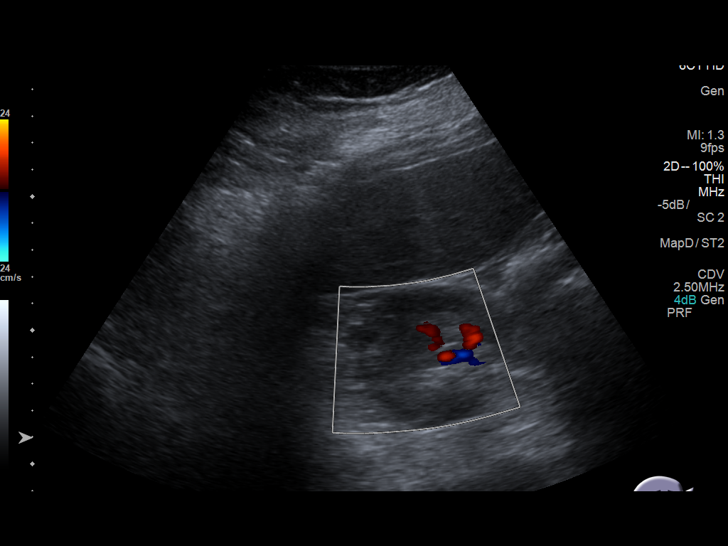
[im 96/116]
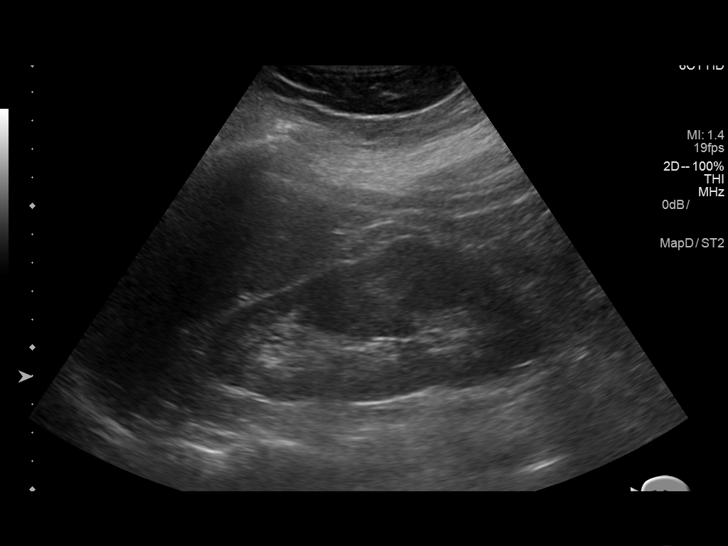
[im 106/116]
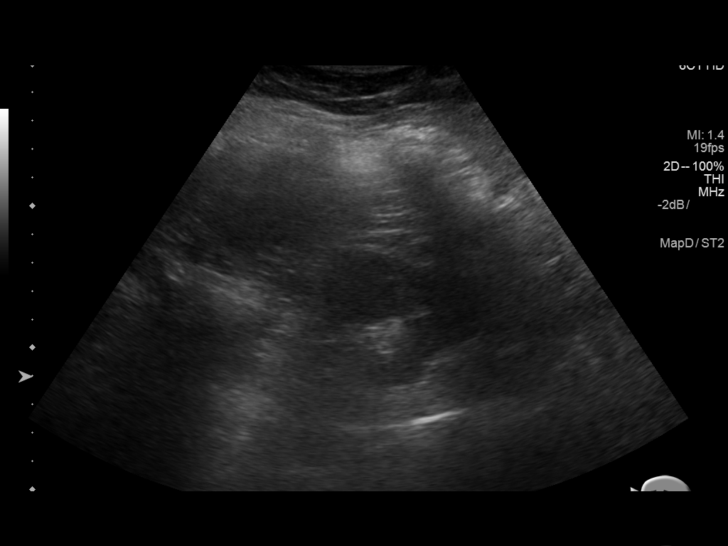
[im 116/116]
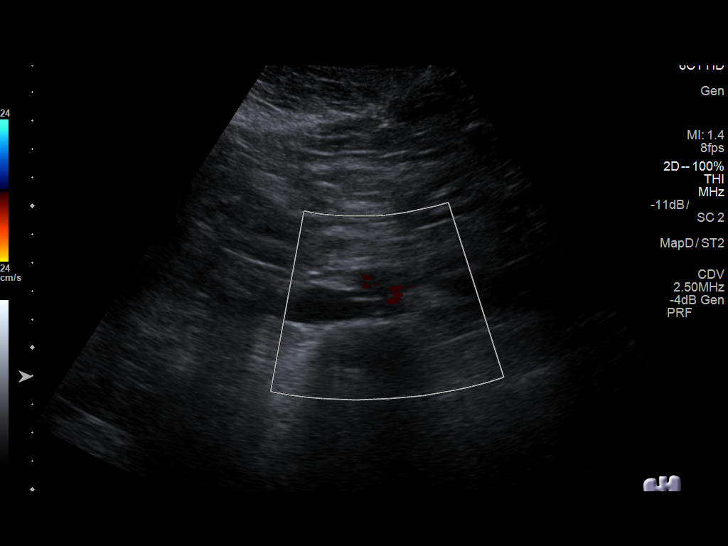

[14 of 25 positions shown; findings below may reference images not displayed]

FINDINGS: Gallbladder: No gallstones or wall thickening visualized. No
sonographic Murphy sign noted by sonographer.

Common bile duct: Diameter: 4 mm

Liver: Increased echogenicity consistent with fatty infiltration. No
focal mass is noted.

IVC: No abnormality visualized.

Pancreas: Visualized portion unremarkable.

Spleen: Size and appearance within normal limits.

Right Kidney: Length: 12 cm. Echogenicity within normal limits. No
mass or hydronephrosis visualized.

Left Kidney: Length: 12 cm. Echogenicity within normal limits. No
mass or hydronephrosis visualized. Previously noted nonobstructing
left renal stone is not appreciated on this exam.

Abdominal aorta: No aneurysm visualized.

Other findings: None.
IMPRESSION: Fatty liver.

No acute abnormality is noted.

## 2016-12-29 ENCOUNTER — Ambulatory Visit: Payer: BLUE CROSS/BLUE SHIELD | Admitting: Cardiology

## 2016-12-29 ENCOUNTER — Encounter: Payer: Self-pay | Admitting: Cardiology

## 2016-12-29 NOTE — Progress Notes (Deleted)
Clinical Summary Maria Lang is a 36 y.o.female seen today for follow up of the following medical problems.   1. HTN -she reports history of HTN starting at age 68. She is unaware of any prior seconday HTN workup. - no heavy NSAID use. No EtOH. No tobacco. Reports a history of OSA along with CPAP noncompliance. She has upcoming appointment with Maria Maria Lang - since last visit her labs showed normal renal function, elevated TSH at 6.93 with normal free T4 and T3, renin 3, aldo 6, cortisol 5.5.  - last visit we changed HCTZ to chlorthalidone. Bp remained elevated so we added aldactone 25mg  daily on 08/25/15.  - just started on propanolol by pcp on Friday.   - 08/2015 renal US negative -   2. OSA - reports abnormal sleep study about 2 years ago at [facility]. Previously followed by Maria Lang - not using CPAP due to discomfort, machine is broken now.  - last visit we referred to Maria Maria Lang to assist with her OSA and CPAP issues Past Medical History:  Diagnosis Date  . Deviated septum   . History of bilateral salpingectomy   . Hypertension   . Renal stones      Allergies  Allergen Reactions  . Coreg [Carvedilol]     Headache   . Hydrocodeine [Dihydrocodeine] Nausea And Vomiting     Current Outpatient Prescriptions  Medication Sig Dispense Refill  . amLODipine (NORVASC) 10 MG tablet Take 1 tablet (10 mg total) by mouth daily. 30 tablet 0  . docusate sodium (COLACE) 250 MG capsule Take 1 capsule (250 mg total) by mouth daily. (Patient not taking: Reported on 12/04/2016) 30 capsule 0  . hydrocortisone (ANUSOL-HC) 2.5 % rectal cream Apply rectally 2 times daily (Patient not taking: Reported on 12/04/2016) 28.35 g 1  . lisinopril (PRINIVIL,ZESTRIL) 40 MG tablet Take 40 mg by mouth daily.    . ondansetron (ZOFRAN) 4 MG tablet Take 1 tab every 6 hours as needed for nausea. 50 tablet 4  . pantoprazole (PROTONIX) 40 MG tablet Take 40 mg by mouth 2 (two) times daily.    . polyethylene  glycol (MIRALAX / GLYCOLAX) packet Take 17 g by mouth daily. (Patient not taking: Reported on 12/04/2016) 14 each 0  . propranolol (INDERAL) 40 MG tablet Take 90 mg by mouth daily.     . ranitidine (ZANTAC) 150 MG tablet Take 1 tablet (150 mg total) by mouth 2 (two) times daily. 60 tablet 6  . sertraline (ZOLOFT) 50 MG tablet Take 50 mg by mouth daily.    [name] Kitchen zolmitriptan (ZOMIG-ZMT) 2.5 MG disintegrating tablet Take 2.5 mg by mouth as needed for migraine.     No current facility-administered medications for this visit.      Past Surgical History:  Procedure Laterality Date  . ABDOMINAL HYSTERECTOMY     partial, fallopian tubes removed   . BILATERAL SALPINGECTOMY  07.2016  . CYSTOSCOPY WITH RETROGRADE PYELOGRAM, URETEROSCOPY AND STENT PLACEMENT Right 07/29/2015   Procedure: CYSTOSCOPY WITH RIGHT RETROGRADE PYELOGRAM, RIGHT URETEROSCOPY AND STENT PLACEMENT;  Surgeon: Maria Seal, MD;  Location: [facility];  Service: Urology;  Laterality: Right;  . ECTOPIC PREGNANCY SURGERY  10/2013  . HOLMIUM LASER APPLICATION Right 09/30/4257   Procedure: HOLMIUM LASER APPLICATION;  Surgeon: Maria Seal, MD;  Location: [facility];  Service: Urology;  Laterality: Right;  . NASAL SEPTUM SURGERY     doen along with tonsillectomy   . TONSILLECTOMY    . TYMPANOSTOMY  x 3     Allergies  Allergen Reactions  . Coreg [Carvedilol]     Headache   . Hydrocodeine [Dihydrocodeine] Nausea And Vomiting      Family History  Problem Relation Age of Onset  . Hypertension Mother   . Hypertension Father   . Hyperlipidemia Father   . Diabetes Brother   . Diabetes Other      Social History [name] reports that she quit smoking about 2 years ago. Her smoking use included Cigarettes. She started smoking about 19 years ago. She has a 3.50 pack-year smoking history. She has never used smokeless tobacco. [name] reports that she does not drink alcohol.   Review of Systems CONSTITUTIONAL: No weight loss, fever,  chills, weakness or fatigue.  HEENT: Eyes: No visual loss, blurred vision, double vision or yellow sclerae.No hearing loss, sneezing, congestion, runny nose or sore throat.  SKIN: No rash or itching.  CARDIOVASCULAR:  RESPIRATORY: No shortness of breath, cough or sputum.  GASTROINTESTINAL: No anorexia, nausea, vomiting or diarrhea. No abdominal pain or blood.  GENITOURINARY: No burning on urination, no polyuria NEUROLOGICAL: No headache, dizziness, syncope, paralysis, ataxia, numbness or tingling in the extremities. No change in bowel or bladder control.  MUSCULOSKELETAL: No muscle, back pain, joint pain or stiffness.  LYMPHATICS: No enlarged nodes. No history of splenectomy.  PSYCHIATRIC: No history of depression or anxiety.  ENDOCRINOLOGIC: No reports of sweating, cold or heat intolerance. No polyuria or polydipsia.  [name] Kitchen   Physical Examination There were no vitals filed for this visit. There were no vitals filed for this visit.  Gen: resting comfortably, no acute distress HEENT: no scleral icterus, pupils equal round and reactive, no palptable cervical adenopathy,  CV Resp: Clear to auscultation bilaterally GI: abdomen is soft, non-tender, non-distended, normal bowel sounds, no hepatosplenomegaly MSK: extremities are warm, no edema.  Skin: warm, no rash Neuro:  no focal deficits Psych: appropriate affect   Diagnostic Studies 11/2014 echo Study Conclusions  - Left ventricle: The cavity size was normal. Wall thickness was increased in a pattern of mild LVH. Systolic function was normal. The estimated ejection fraction was in the range of 60% to 65%. Doppler parameters are consistent with abnormal left ventricular relaxation (grade 1 diastolic dysfunction).  11/2014 CT PE IMPRESSION: 1. No evidence of pulmonary embolus. 2. Lungs clear bilaterally.   08/2015 renal US No stenosis Assessment and Plan  1. Resistant HTN - resistant HTN in young woman with very early  onset of HTN, concerning for possible secondary HTN - thus far workup document above unremarkable, we will plan for renal artery ultrasound. Stop propanolol and start labetalol 200mg  bid, titrate as need for bp control.  - she will present bp log in 2 weeks  2. OSA - has appointment this week with Maria Maria Lang, OSA could be playing role in her difficult to control HTN.   F/u 1 month      Arnoldo Lenis, M.D.

## 2017-01-11 ENCOUNTER — Encounter: Payer: Self-pay | Admitting: Gastroenterology

## 2017-01-18 ENCOUNTER — Ambulatory Visit (AMBULATORY_SURGERY_CENTER): Payer: BLUE CROSS/BLUE SHIELD | Admitting: Gastroenterology

## 2017-01-18 ENCOUNTER — Encounter: Payer: Self-pay | Admitting: Gastroenterology

## 2017-01-18 VITALS — BP 138/99 | HR 70 | Temp 99.3°F | Resp 19 | Ht 66.0 in | Wt 195.0 lb

## 2017-01-18 DIAGNOSIS — R112 Nausea with vomiting, unspecified: Secondary | ICD-10-CM | POA: Diagnosis not present

## 2017-01-18 DIAGNOSIS — K3189 Other diseases of stomach and duodenum: Secondary | ICD-10-CM | POA: Diagnosis not present

## 2017-01-18 DIAGNOSIS — K2950 Unspecified chronic gastritis without bleeding: Secondary | ICD-10-CM | POA: Diagnosis not present

## 2017-01-18 DIAGNOSIS — K2100 Gastro-esophageal reflux disease with esophagitis, without bleeding: Secondary | ICD-10-CM

## 2017-01-18 DIAGNOSIS — K219 Gastro-esophageal reflux disease without esophagitis: Secondary | ICD-10-CM | POA: Diagnosis not present

## 2017-01-18 DIAGNOSIS — K2210 Ulcer of esophagus without bleeding: Secondary | ICD-10-CM

## 2017-01-18 MED ORDER — SODIUM CHLORIDE 0.9 % IV SOLN: 500.0000 mL | INTRAVENOUS | Status: AC

## 2017-01-18 MED ORDER — PANTOPRAZOLE SODIUM 40 MG PO TBEC: 40.0000 mg | DELAYED_RELEASE_TABLET | Freq: Two times a day (BID) | ORAL | 3 refills | Status: DC

## 2017-01-18 NOTE — Progress Notes (Signed)
Called to room to assist during endoscopic procedure.  Patient ID and intended procedure confirmed with present staff. Received instructions for my participation in the procedure from the performing physician.  

## 2017-01-18 NOTE — Op Note (Signed)
[place] Patient Name: Maria Lang Procedure Date: 01/18/2017 9:48 AM MRN: [id number] Endoscopist: Ladene Artist , MD Age: 36 Referring MD:  Date of Birth: 1980/09/29 Gender: Female Account #: [id number] Procedure:                Upper GI endoscopy Indications:              Gastroesophageal reflux disease, Nausea with                            vomiting Medicines:                Monitored Anesthesia Care Procedure:                Pre-Anesthesia Assessment:                           - Prior to the procedure, a History and Physical                            was performed, and patient medications and                            allergies were reviewed. The patient's tolerance of                            previous anesthesia was also reviewed. The risks                            and benefits of the procedure and the sedation                            options and risks were discussed with the patient.                            All questions were answered, and informed consent                            was obtained. Prior Anticoagulants: The patient has                            taken no previous anticoagulant or antiplatelet                            agents. ASA Grade Assessment: II - A patient with                            mild systemic disease. After reviewing the risks                            and benefits, the patient was deemed in                            satisfactory condition to undergo the procedure.  After obtaining informed consent, the endoscope was                            passed under direct vision. Throughout the                            procedure, the patient's blood pressure, pulse, and                            oxygen saturations were monitored continuously. The                            Endoscope was introduced through the mouth, and                            advanced to the second part of duodenum. The upper                             GI endoscopy was accomplished without difficulty.                            The patient tolerated the procedure well. Scope In: Scope Out: Findings:                 LA Grade B (one or more mucosal breaks greater than                            5 mm, not extending between the tops of two mucosal                            folds) esophagitis with no bleeding was found in                            the distal esophagus.                           No other significant abnormalities were identified                            in a careful examination of the esophagus.                           Localized mild inflammation characterized by                            erosions and erythema was found in the gastric                            antrum. Biopsies were taken with a cold forceps for                            histology.                           A  small hiatal hernia was present.                           The exam of the stomach was otherwise normal.                           The duodenal bulb and second portion of the                            duodenum were normal. Complications:            No immediate complications. Estimated Blood Loss:     Estimated blood loss was minimal. Impression:               - LA Grade B reflux esophagitis.                           - Gastritis. Biopsied.                           - Small hiatal hernia.                           - Normal duodenal bulb and second portion of the                            duodenum. Recommendation:           - Patient has a contact number available for                            emergencies. The signs and symptoms of potential                            delayed complications were discussed with the                            patient. Return to normal activities tomorrow.                            Written discharge instructions were provided to the                            patient.                            - Resume previous diet.                           - Antireflux measure and 4 " bedblocks                           - Continue present medications.                           - Await pathology results.                           -  Pantoprazole 40 mg po bid ( before breakfast and                            dinner), 1 year of refils                           - Office visit in about 6 weeks. Ladene Artist, MD 01/18/2017 10:05:07 AM This report has been signed electronically.

## 2017-01-18 NOTE — Patient Instructions (Signed)
Discharge instructions given. Handout on gastritis and a hiatal hernia. Resume previous medications. Prescription sent to pharmacy. Office will call and schedule 6 weeks follow up. YOU HAD AN ENDOSCOPIC PROCEDURE TODAY AT [facility]:   Refer to the procedure report that was given to you for any specific questions about what was found during the examination.  If the procedure report does not answer your questions, please call your gastroenterologist to clarify.  If you requested that your care partner not be given the details of your procedure findings, then the procedure report has been included in a sealed envelope for you to review at your convenience later.  YOU SHOULD EXPECT: Some feelings of bloating in the abdomen. Passage of more gas than usual.  Walking can help get rid of the air that was put into your GI tract during the procedure and reduce the bloating. If you had a lower endoscopy (such as a colonoscopy or flexible sigmoidoscopy) you may notice spotting of blood in your stool or on the toilet paper. If you underwent a bowel prep for your procedure, you may not have a normal bowel movement for a few days.  Please Note:  You might notice some irritation and congestion in your nose or some drainage.  This is from the oxygen used during your procedure.  There is no need for concern and it should clear up in a day or so.  SYMPTOMS TO REPORT IMMEDIATELY:    Following upper endoscopy (EGD)  Vomiting of blood or coffee ground material  New chest pain or pain under the shoulder blades  Painful or persistently difficult swallowing  New shortness of breath  Fever of 100F or higher  Black, tarry-looking stools  For urgent or emergent issues, a gastroenterologist can be reached at any hour by calling [phone].   DIET:  We do recommend a small meal at first, but then you may proceed to your regular diet.  Drink plenty of fluids but you should avoid alcoholic  beverages for 24 hours.  ACTIVITY:  You should plan to take it easy for the rest of today and you should NOT DRIVE or use heavy machinery until tomorrow (because of the sedation medicines used during the test).    FOLLOW UP: Our staff will call the number listed on your records the next business day following your procedure to check on you and address any questions or concerns that you may have regarding the information given to you following your procedure. If we do not reach you, we will leave a message.  However, if you are feeling well and you are not experiencing any problems, there is no need to return our call.  We will assume that you have returned to your regular daily activities without incident.  If any biopsies were taken you will be contacted by phone or by letter within the next 1-3 weeks.  Please call us at [phone] if you have not heard about the biopsies in 3 weeks.    SIGNATURES/CONFIDENTIALITY: You and/or your care partner have signed paperwork which will be entered into your electronic medical record.  These signatures attest to the fact that that the information above on your After Visit Summary has been reviewed and is understood.  Full responsibility of the confidentiality of this discharge information lies with you and/or your care-partner.

## 2017-01-18 NOTE — Progress Notes (Signed)
Report to PACU, RN, vss, BBS= Clear.  

## 2017-01-18 NOTE — Progress Notes (Signed)
Pt's states no medical or surgical changes since previsit or office visit. 

## 2017-01-19 ENCOUNTER — Telehealth: Payer: Self-pay | Admitting: *Deleted

## 2017-01-19 NOTE — Telephone Encounter (Signed)
  Follow up Call-  Call back number 01/18/2017  Post procedure Call Back phone  # [phone]  Permission to leave phone message No  Some recent data might be hidden     Patient questions:  Do you have a fever, pain , or abdominal swelling? No. Pain Score  0 *  Have you tolerated food without any problems? Yes.    Have you been able to return to your normal activities? Yes.    Do you have any questions about your discharge instructions: Diet   No. Medications  No. Follow up visit  No.  Do you have questions or concerns about your Care? No.  Actions: * If pain score is 4 or above: No action needed, pain <4.

## 2017-01-27 ENCOUNTER — Emergency Department (HOSPITAL_COMMUNITY): Payer: BLUE CROSS/BLUE SHIELD

## 2017-01-27 ENCOUNTER — Emergency Department (HOSPITAL_COMMUNITY)
Admission: EM | Admit: 2017-01-27 | Discharge: 2017-01-27 | Disposition: A | Discharge status: Home / Self Care | Payer: BLUE CROSS/BLUE SHIELD | Attending: Emergency Medicine | Admitting: Emergency Medicine

## 2017-01-27 ENCOUNTER — Encounter (HOSPITAL_COMMUNITY): Payer: Self-pay | Admitting: Cardiology

## 2017-01-27 DIAGNOSIS — Z87891 Personal history of nicotine dependence: Secondary | ICD-10-CM | POA: Insufficient documentation

## 2017-01-27 DIAGNOSIS — R252 Cramp and spasm: Secondary | ICD-10-CM

## 2017-01-27 DIAGNOSIS — R220 Localized swelling, mass and lump, head: Secondary | ICD-10-CM

## 2017-01-27 DIAGNOSIS — K0889 Other specified disorders of teeth and supporting structures: Secondary | ICD-10-CM

## 2017-01-27 DIAGNOSIS — I10 Essential (primary) hypertension: Secondary | ICD-10-CM | POA: Diagnosis not present

## 2017-01-27 LAB — BASIC METABOLIC PANEL
ANION GAP: 9 (ref 5–15)
BUN: 11 mg/dL (ref 6–20)
CALCIUM: 9.5 mg/dL (ref 8.9–10.3)
CO2: 23 mmol/L (ref 22–32)
Chloride: 107 mmol/L (ref 101–111)
Creatinine, Ser: 0.72 mg/dL (ref 0.44–1.00)
GFR calc Af Amer: >60 mL/min (ref >60)
Glucose, Bld: 148 mg/dL — ABNORMAL HIGH (ref 65–99)
Potassium: 3.4 mmol/L — ABNORMAL LOW (ref 3.5–5.1)
Sodium: 139 mmol/L (ref 135–145)

## 2017-01-27 LAB — CBC WITH DIFFERENTIAL/PLATELET
BASOS ABS: 0 10*3/uL (ref 0.0–0.1)
Basophils Relative: 0 %
EOS ABS: 0 10*3/uL (ref 0.0–0.7)
Eosinophils Relative: 0 %
HCT: 34.6 % — ABNORMAL LOW (ref 36.0–46.0)
HEMOGLOBIN: 11.3 g/dL — AB (ref 12.0–15.0)
LYMPHS ABS: 2.6 10*3/uL (ref 0.7–4.0)
LYMPHS PCT: 33 %
MCH: 27.5 pg (ref 26.0–34.0)
MCHC: 32.7 g/dL (ref 30.0–36.0)
MCV: 84.2 fL (ref 78.0–100.0)
Monocytes Absolute: 0.4 10*3/uL (ref 0.1–1.0)
Monocytes Relative: 5 %
NEUTROS ABS: 5 10*3/uL (ref 1.7–7.7)
Neutrophils Relative %: 62 %
Platelets: 289 10*3/uL (ref 150–400)
RBC: 4.11 MIL/uL (ref 3.87–5.11)
RDW: 15.7 % — ABNORMAL HIGH (ref 11.5–15.5)
WBC: 8 10*3/uL (ref 4.0–10.5)

## 2017-01-27 IMAGING — CT CT MAXILLOFACIAL W/ CM
3 of 5 series · 16 of 47 positions shown, 19 images · IV contrast (Isovue)
Comparison: [DATE]

CLINICAL DATA: Right-sided dental swelling and pain for 1 hour.
"Mass, lump, or swelling, maxillofacial"

EXAM:
CT MAXILLOFACIAL WITH CONTRAST
TECHNIQUE: Multidetector CT imaging of the maxillofacial structures was
performed with intravenous contrast. Multiplanar CT image
reconstructions were also generated.
CONTRAST:  75mL [4D] IOPAMIDOL ([4D]) INJECTION 61%

[Series 2: max soft · axial · 0.37mm/px · z∈[+240,+390]mm · 12 of 88 slices shown, 15 images]
[im 7/88  brain]
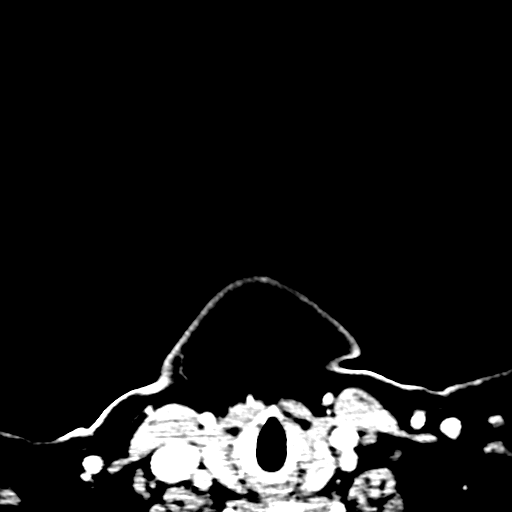
[im 7/88  bone]
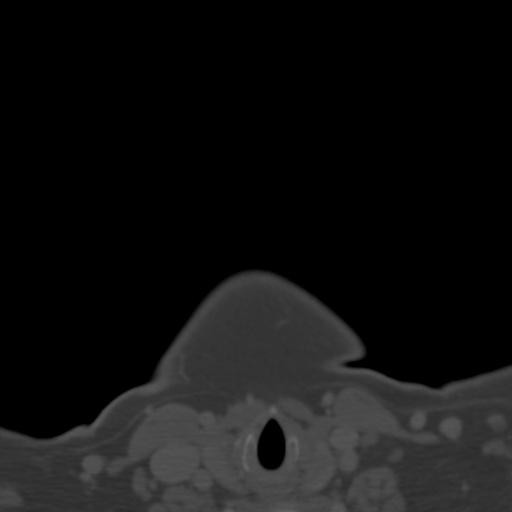
[im 13/88  bone]
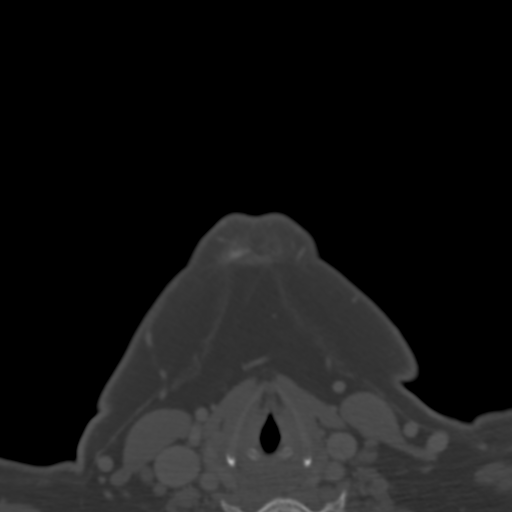
[im 19/88  bone]
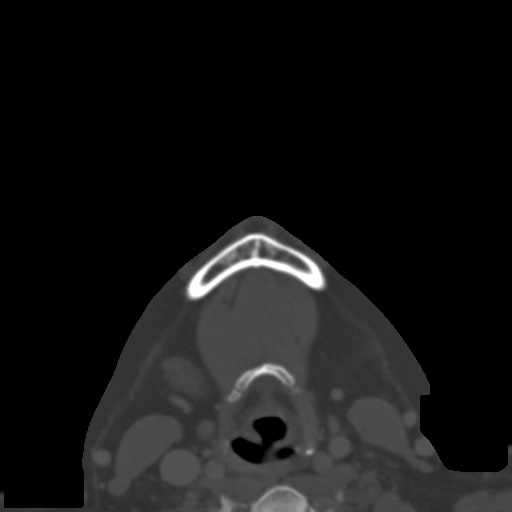
[im 28/88  bone]
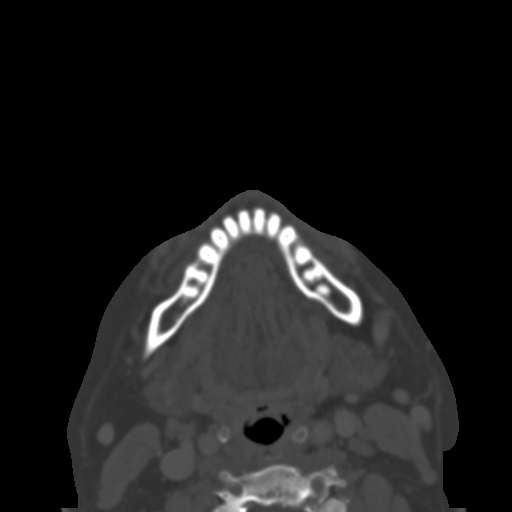
[im 34/88  brain]
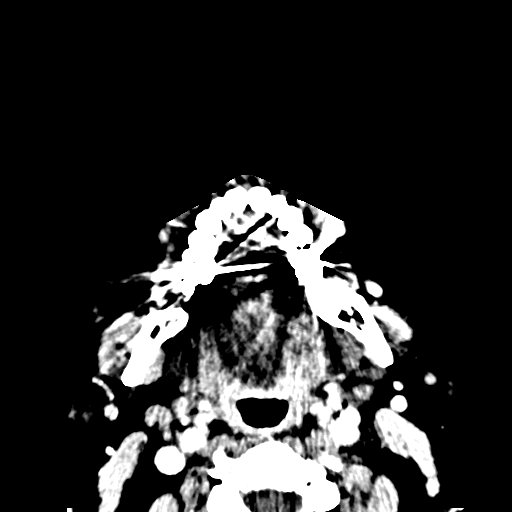
[im 34/88  bone]
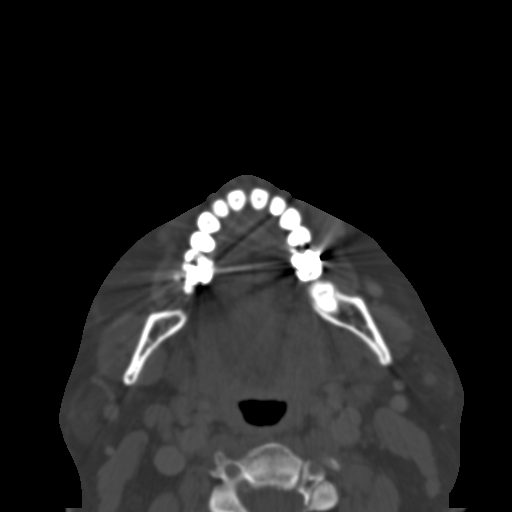
[im 40/88  bone]
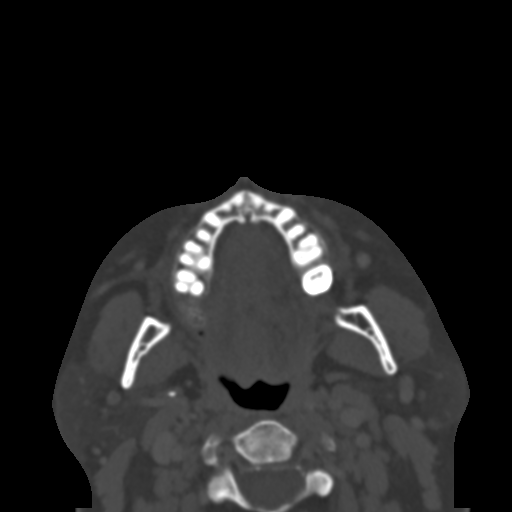
[im 49/88  bone]
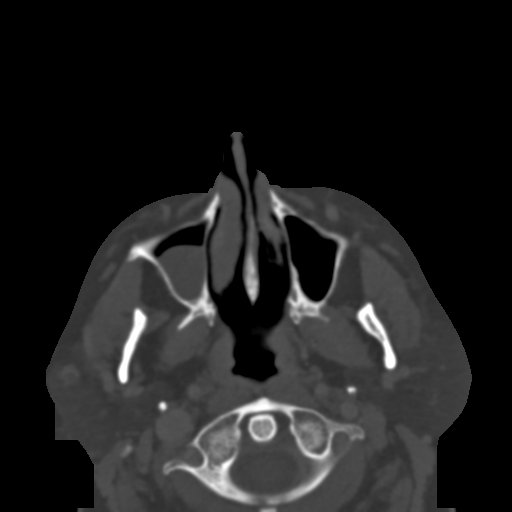
[im 55/88  bone]
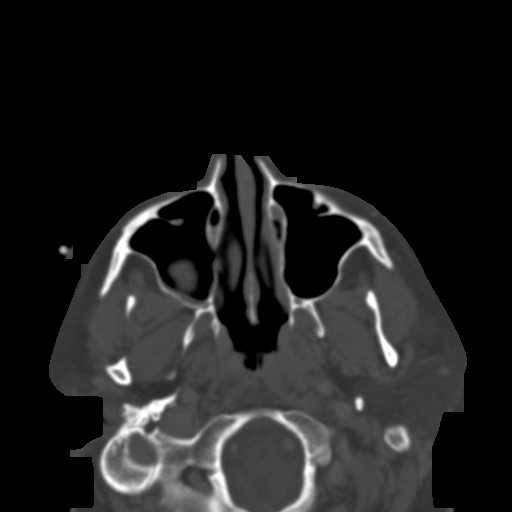
[im 61/88  brain]
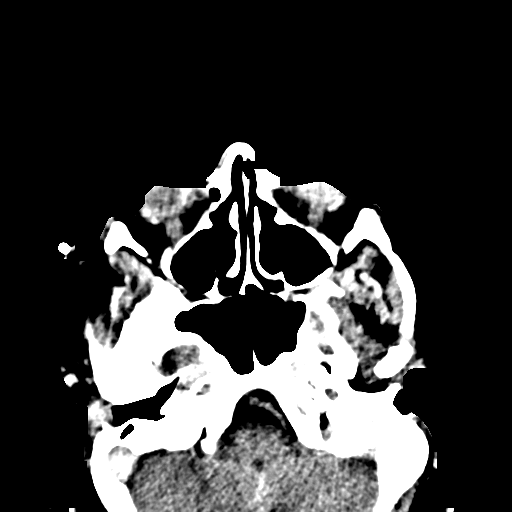
[im 61/88  bone]
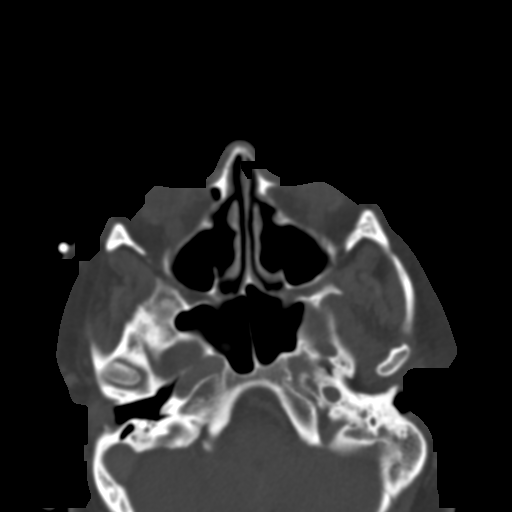
[im 70/88  bone]
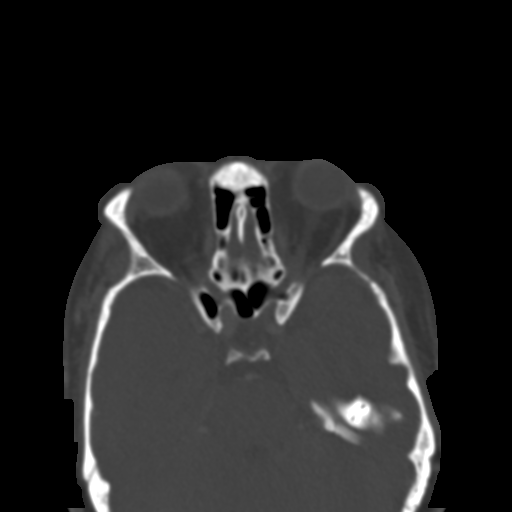
[im 76/88  bone]
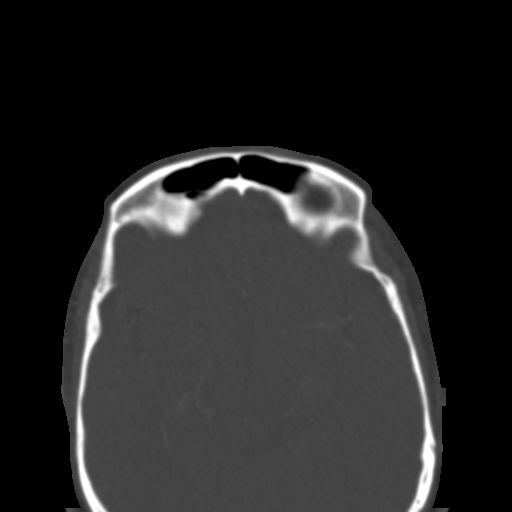
[im 82/88  bone]
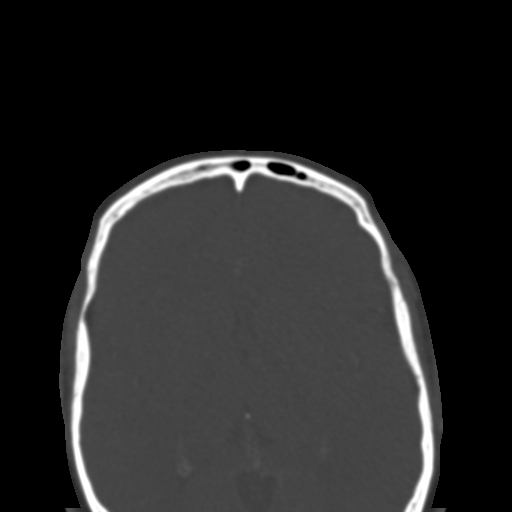

[Series 6: coronal bone · coronal · 0.37mm/px · 3 of 75 slices shown]
[im 19/75  bone]
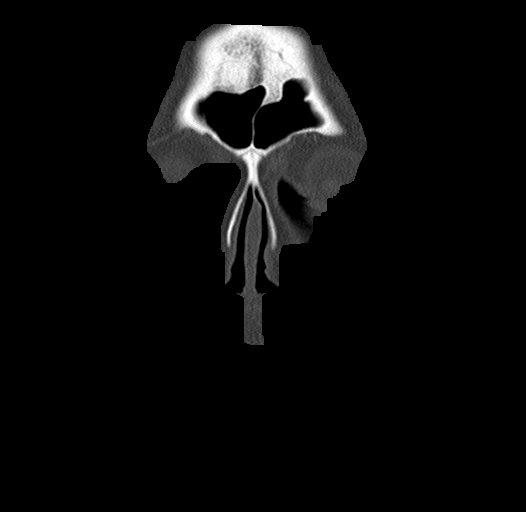
[im 38/75  bone]
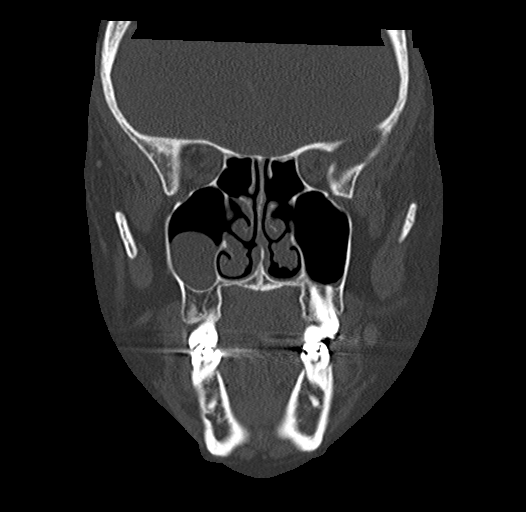
[im 56/75  bone]
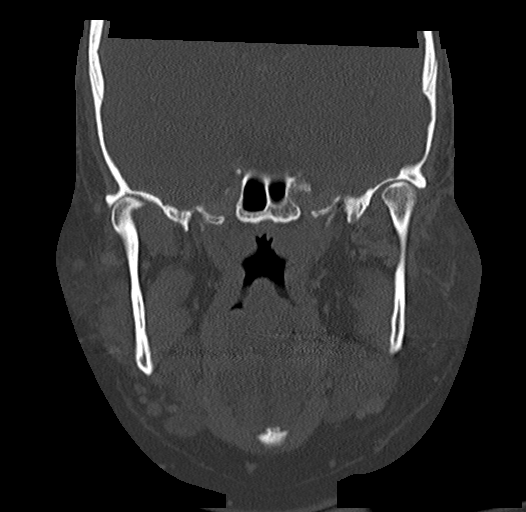

[Series 7: sagittal bone · sagittal · 0.34mm/px · 1 of 99 slices shown]
[im 50/99  bone]
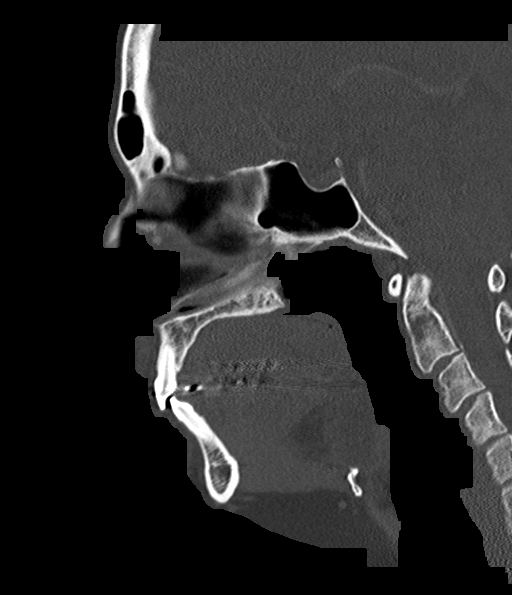

[16 of 47 positions shown; findings below may reference images not displayed]

FINDINGS: Osseous: No fracture or mandibular dislocation. No destructive
process. No visible unfilled cavity or periapical erosion noted.

Orbits: Negative. No traumatic or inflammatory finding.

Sinuses: Status post endoscopic sinus surgery with medial maxillary
antrostomies, ethmoidectomies, and sphenoidotomies. Surgical
openings are widely patent. Chronic mucous retention cyst on the
floor of the right maxillary sinus.

Soft tissues: No noted inflammation or mass.

Limited intracranial: Negative
IMPRESSION: 1. Negative exam. No evidence of inflammation or active dental
disease.
2. Remote endoscopic sinus surgery with widely patent postoperative
sinuses.

## 2017-01-27 MED ORDER — SODIUM CHLORIDE 0.9 % IV BOLUS (SEPSIS)
500.0000 mL | Freq: Once | INTRAVENOUS | Status: AC
  Administered 2017-01-27: 500 mL via INTRAVENOUS

## 2017-01-27 MED ORDER — AMOXICILLIN-POT CLAVULANATE 875-125 MG PO TABS: 1.0000 | ORAL_TABLET | Freq: Two times a day (BID) | ORAL | 0 refills | Status: DC

## 2017-01-27 MED ORDER — ONDANSETRON HCL 4 MG/2ML IJ SOLN
4.0000 mg | Freq: Once | INTRAMUSCULAR | Status: AC
  Administered 2017-01-27: 4 mg via INTRAVENOUS
  Filled 2017-01-27: qty 2

## 2017-01-27 MED ORDER — MORPHINE SULFATE (PF) 4 MG/ML IV SOLN
4.0000 mg | Freq: Once | INTRAVENOUS | Status: AC
  Administered 2017-01-27: 4 mg via INTRAVENOUS
  Filled 2017-01-27: qty 1

## 2017-01-27 MED ORDER — IOPAMIDOL (ISOVUE-300) INJECTION 61%
75.0000 mL | Freq: Once | INTRAVENOUS | Status: AC | PRN
  Administered 2017-01-27: 75 mL via INTRAVENOUS

## 2017-01-27 MED ORDER — TRAMADOL HCL 50 MG PO TABS: 50.0000 mg | ORAL_TABLET | Freq: Four times a day (QID) | ORAL | 0 refills | Status: DC | PRN

## 2017-01-27 NOTE — ED Notes (Signed)
Pt reports tooth and jaw pain for 1 hour Has not seen a dentist "in awhile" per her report Pt is anxious, but has a history of of anxiety

## 2017-01-27 NOTE — ED Notes (Signed)
To CT Pt reports her pain at 5/10

## 2017-01-27 NOTE — ED Provider Notes (Signed)
Emergency Department Provider Note   I have reviewed the triage vital signs and the nursing notes.   HISTORY  Chief Complaint Dental Pain   HPI Maria Lang is a 36 y.o. female HTN and GERD presents to the ED for evaluation of sudden onset right face pain. Symptoms began several hours PTA. Had some mild dental pain 2 weeks prior but symptoms resolved. Patient reports pain mostly over the right jaw, right, right ear. She has pain and difficulty with opening her mouth. She is having difficulty taking her medication because she is finding it very painful to open her mouth. She is able to swallow liquids without difficulty. No voice changes. No difficulty breathing. No fevers or chills. No radiation of symptoms.    Past Medical History:  Diagnosis Date  . Anxiety   . Deviated septum   . GERD (gastroesophageal reflux disease)   . History of bilateral salpingectomy   . Hypertension   . Renal stones   . Sleep apnea    Doesn't use everyday    Patient Active Problem List   Diagnosis Date Noted  . Chest pain 12/10/2014  . GERD (gastroesophageal reflux disease) 12/10/2014  . Chronic diastolic CHF (congestive heart failure) ([place]) 12/10/2014  . Hypertension   . Pain in the chest   . Hypertensive emergency     Past Surgical History:  Procedure Laterality Date  . ABDOMINAL HYSTERECTOMY     partial, fallopian tubes removed   . BILATERAL SALPINGECTOMY  07.2016  . CYSTOSCOPY WITH RETROGRADE PYELOGRAM, URETEROSCOPY AND STENT PLACEMENT Right 07/29/2015   Procedure: CYSTOSCOPY WITH RIGHT RETROGRADE PYELOGRAM, RIGHT URETEROSCOPY AND STENT PLACEMENT;  Surgeon: Irine Seal, MD;  Location: [facility];  Service: Urology;  Laterality: Right;  . ECTOPIC PREGNANCY SURGERY  10/2013  . HOLMIUM LASER APPLICATION Right 0/06/270   Procedure: HOLMIUM LASER APPLICATION;  Surgeon: Irine Seal, MD;  Location: [facility];  Service: Urology;  Laterality: Right;  . NASAL SEPTUM SURGERY     doen along with  tonsillectomy   . TONSILLECTOMY    . TYMPANOSTOMY     x 3    Current Outpatient Rx  . Order #: [id number] Class: Historical Med  . Order #: [id number] Class: Print  . Order #: [id number] Class: Historical Med  . Order #: [id number] Class: Normal  . Order #: [id number] Class: Normal  . Order #: [id number] Class: Historical Med  . Order #: [id number] Class: Normal  . Order #: [id number] Class: Historical Med  . Order #: [id number] Class: Historical Med  . Order #: [id number] Class: Print  . Order #: [id number] Class: Print    Allergies Coreg [carvedilol]; Hydrocodeine [dihydrocodeine]; and Labetalol  Family History  Problem Relation Age of Onset  . Hypertension Mother   . Hypertension Father   . Hyperlipidemia Father   . Diabetes Brother   . Diabetes Other     Social History Social History  Substance Use Topics  . Smoking status: Former Smoker    Packs/day: 0.25    Years: 14.00    Types: Cigarettes    Start date: 10/04/1997    Quit date: 06/10/2014  . Smokeless tobacco: Never Used  . Alcohol use No    Review of Systems  Constitutional: No fever/chills Eyes: No visual changes. ENT: No sore throat. Positive right face pain and swelling.  Cardiovascular: Denies chest pain. Respiratory: Denies shortness of breath. Gastrointestinal: No abdominal pain.  No nausea, no vomiting.  No diarrhea.  No constipation. Genitourinary: Negative for dysuria. Musculoskeletal: Negative for back pain. Skin: Negative for rash. Neurological:  Negative for headaches, focal weakness or numbness.  10-point ROS otherwise negative.  ____________________________________________   PHYSICAL EXAM:  VITAL SIGNS: ED Triage Vitals [01/27/17 1318]  Enc Vitals Group     BP (!) 214/126     Pulse Rate 96     Resp 15     Temp 98.1 F (36.7 C)     Temp Source Oral     SpO2 100 %     Weight 195 lb (88.5 kg)     Height 5\' 6"  (1.676 m)     Pain Score 9   Constitutional: Alert and oriented. Well appearing and  in no acute distress. Eyes: Conjunctivae are normal.  Head: Atraumatic. Nose: No congestion/rhinnorhea. Mouth/Throat: Mucous membranes are dry. Positive trismus. No PTA or oropharyngeal erythema/exudate. No gingival abscess. Poor dentition.  Neck: No stridor.  Cardiovascular: Normal rate, regular rhythm. Good peripheral circulation. Grossly normal heart sounds.   Respiratory: Normal respiratory effort.  No retractions. Lungs CTAB. Gastrointestinal: Soft and nontender. No distention.  Musculoskeletal: No lower extremity tenderness nor edema. No gross deformities of extremities. Neurologic:  Normal speech and language. No gross focal neurologic deficits are appreciated.  Skin:  Skin is warm, dry and intact. No rash noted.  ____________________________________________   LABS (all labs ordered are listed, but only abnormal results are displayed)  Labs Reviewed  BASIC METABOLIC PANEL - Abnormal; Notable for the following:       Result Value   Potassium 3.4 (*)    Glucose, Bld 148 (*)    All other components within normal limits  CBC WITH DIFFERENTIAL/PLATELET - Abnormal; Notable for the following:    Hemoglobin 11.3 (*)    HCT 34.6 (*)    RDW 15.7 (*)    All other components within normal limits   ____________________________________________  RADIOLOGY  CT max face w/ contrast:    IMPRESSION: 1. Negative exam. No evidence of inflammation or active dental disease. 2. Remote endoscopic sinus surgery with widely patent postoperative sinuses. ____________________________________________   PROCEDURES  Procedure(s) performed:   Procedures  None ____________________________________________   INITIAL IMPRESSION / ASSESSMENT AND PLAN / ED COURSE  Pertinent labs & imaging results that were available during my care of the patient were reviewed by me and considered in my medical decision making (see chart for details).  Patient presents to the ED for evaluation of right  face pain and swelling. Has some trismus on exam with no visible pathology in the posterior pharynx. Had some dental pain recently. Soft submandibular compartment. Plan for CT face with contrast to r/o deep space infection process and give IVF with morphine and zofran. No need for acute airway mgmt at this time.    ____________________________________________  FINAL CLINICAL IMPRESSION(S) / ED DIAGNOSES  Final diagnoses:  Swelling of right side of face  Pain, dental  Trismus     MEDICATIONS GIVEN DURING THIS VISIT:  Medications  sodium chloride 0.9 % bolus 500 mL (0 mLs Intravenous Stopped 01/27/17 1559)  morphine 4 MG/ML injection 4 mg (4 mg Intravenous Given 01/27/17 1356)  ondansetron (ZOFRAN) injection 4 mg (4 mg Intravenous Given 01/27/17 1356)  iopamidol (ISOVUE-300) 61 % injection 75 mL (75 mLs Intravenous Contrast Given 01/27/17 1526)     NEW OUTPATIENT MEDICATIONS STARTED DURING THIS VISIT:  Discharge Medication List as of 01/27/2017  3:54 PM    START taking these medications   Details  amoxicillin-clavulanate (AUGMENTIN) 875-125 MG tablet Take 1 tablet by mouth every 12 (twelve) hours., Starting Sat 01/27/2017,  Print    traMADol (ULTRAM) 50 MG tablet Take 1 tablet (50 mg total) by mouth every 6 (six) hours as needed., Starting Sat 01/27/2017, Print        Note:  This document was prepared using Dragon voice recognition software and may include unintentional dictation errors.  Nanda Quinton, MD Emergency Medicine    Long, Wonda Old[facility], MD 01/28/17 [phone]

## 2017-01-27 NOTE — ED Triage Notes (Addendum)
Right sided dental  swelling and pain times 1 hour

## 2017-01-27 NOTE — Discharge Instructions (Signed)
You were seen in the ED today with face pain and swelling. The CT scan was normal. This could be an infected tooth, TMJ pain, or nerve pain in the face.   Take antibiotics as directed and follow up with your PCP and Dental practice below.   Return to the emergency room for fever, change in vision, redness to the face that rapidly spreads towards the eye, nausea or vomiting, difficulty swallowing or shortness of breath.   Apply warm compresses to jaw throughout the day.    Take your antibiotics as directed and to the end of the course.   Followup with a dentist is very important for ongoing evaluation and management of recurrent dental pain. Return to emergency department for emergent changing or worsening symptoms."  Low-cost dental clinic: [facility]  at [phone]**   You may also call [facility] If the dentist on-call cannot see you, please use the resources below:   Patients with Medicaid: [place] [address], [place] [address] [phone]  If unable to pay, or uninsured, contact [facility] [phone]) or [facility] [phone] in [place], [place] in [facility]) to become qualified for the adult dental clinic  Other [place], [place], [place], [address]    [phone], Ext. [phone]    2nd and 4th Thursday of the month at 6:30am    10 clients each day by appointment, can sometimes see walk-in     patients if someone does not show for an appointment [place], [place], [place], [address]    [phone] [facility]- [address], [place], [place], [address]    [phone]  [place] Department- [phone] [facility] Department- [phone]

## 2017-01-27 NOTE — ED Notes (Signed)
Dr L in to assess 

## 2017-02-04 ENCOUNTER — Encounter: Payer: Self-pay | Admitting: Gastroenterology

## 2017-03-01 ENCOUNTER — Ambulatory Visit: Payer: Self-pay | Admitting: Family

## 2017-03-20 ENCOUNTER — Ambulatory Visit: Payer: Self-pay | Admitting: Physician Assistant

## 2017-03-22 ENCOUNTER — Encounter: Payer: Self-pay | Admitting: General Practice

## 2017-03-29 ENCOUNTER — Other Ambulatory Visit: Payer: Self-pay | Admitting: Physician Assistant

## 2017-06-25 ENCOUNTER — Other Ambulatory Visit: Payer: Self-pay

## 2017-06-25 ENCOUNTER — Encounter (HOSPITAL_COMMUNITY): Payer: Self-pay | Admitting: *Deleted

## 2017-06-25 ENCOUNTER — Ambulatory Visit (HOSPITAL_COMMUNITY)
Admission: EM | Admit: 2017-06-25 | Discharge: 2017-06-25 | Disposition: A | Discharge status: Home / Self Care | Payer: BLUE CROSS/BLUE SHIELD | Attending: Family Medicine | Admitting: Family Medicine

## 2017-06-25 DIAGNOSIS — J111 Influenza due to unidentified influenza virus with other respiratory manifestations: Secondary | ICD-10-CM | POA: Diagnosis not present

## 2017-06-25 MED ORDER — OSELTAMIVIR PHOSPHATE 75 MG PO CAPS: 75.0000 mg | ORAL_CAPSULE | Freq: Two times a day (BID) | ORAL | 0 refills | Status: DC

## 2017-06-25 MED ORDER — HYDROCODONE-HOMATROPINE 5-1.5 MG/5ML PO SYRP: 5.0000 mL | ORAL_SOLUTION | Freq: Four times a day (QID) | ORAL | 0 refills | Status: DC | PRN

## 2017-06-25 NOTE — ED Provider Notes (Signed)
[facility]   [id number] 06/25/17 Arrival Time: 1026  ASSESSMENT & PLAN:  1. Influenza     Meds ordered this encounter  Medications  . HYDROcodone-homatropine (HYCODAN) 5-1.5 MG/5ML syrup    Sig: Take 5 mLs by mouth every 6 (six) hours as needed for cough.    Dispense:  90 mL    Refill:  0  . oseltamivir (TAMIFLU) 75 MG capsule    Sig: Take 1 capsule (75 mg total) by mouth every 12 (twelve) hours.    Dispense:  10 capsule    Refill:  0   Cough medication sedation precautions discussed. OTC symptom care as needed. May f/u as needed.  Reviewed expectations re: course of current medical issues. Questions answered. Outlined signs and symptoms indicating need for more acute intervention. Patient verbalized understanding. After Visit Summary given.   SUBJECTIVE: History from: patient.  Maria Lang is a 36 y.o. female who presents with complaint of nasal congestion, post-nasal drainage, and a persistent cough. Onset abrupt, approximately 1 day ago. Overall fatigued with body aches. SOB: none. Wheezing: none. Fever: yes, subjective. Overall normal PO intake without n/v. Sick contacts: no. OTC treatment: None.  Social History   Tobacco Use  Smoking Status Former Smoker  . Packs/day: 0.25  . Years: 14.00  . Pack years: 3.50  . Types: Cigarettes  . Start date: 10/04/1997  . Last attempt to quit: 06/10/2014  . Years since quitting: 3.0  Smokeless Tobacco Never Used   ROS: As per HPI.  OBJECTIVE:  Vitals:   06/25/17 1117  BP: (!) 185/108  Pulse: 78  Temp: (!) 97.2 F (36.2 C)  TempSrc: Oral  SpO2: 100%    General appearance: alert; appears fatigued HEENT: nasal congestion; clear runny nose; throat irritation secondary to post-nasal drainage Neck: supple without LAD Lungs: clear to auscultation bilaterally; cough: moderate; no respiratory distress Skin: warm and dry Psychological: alert and cooperative; normal mood and affect   Allergies    Allergen Reactions  . Coreg [Carvedilol]     Headache   . Hydrocodeine [Dihydrocodeine] Nausea And Vomiting  . Labetalol Other (See Comments)    Head itches    Past Medical History:  Diagnosis Date  . Anxiety   . Deviated septum   . GERD (gastroesophageal reflux disease)   . History of bilateral salpingectomy   . Hypertension   . Renal stones   . Sleep apnea    Doesn't use everyday   Family History  Problem Relation Age of Onset  . Hypertension Mother   . Hypertension Father   . Hyperlipidemia Father   . Diabetes Brother   . Diabetes Other    Social History   Socioeconomic History  . Marital status: Married    Spouse name: Not on file  . Number of children: Not on file  . Years of education: Not on file  . Highest education level: Not on file  Social Needs  . Financial resource strain: Not on file  . Food insecurity - worry: Not on file  . Food insecurity - inability: Not on file  . Transportation needs - medical: Not on file  . Transportation needs - non-medical: Not on file  Occupational History  . Not on file  Tobacco Use  . Smoking status: Former Smoker    Packs/day: 0.25    Years: 14.00    Pack years: 3.50    Types: Cigarettes    Start date: 10/04/1997    Last attempt to quit: 06/10/2014  Years since quitting: 3.0  . Smokeless tobacco: Never Used  Substance and Sexual Activity  . Alcohol use: No    Alcohol/week: 0.0 oz  . Drug use: No  . Sexual activity: Yes    Birth control/protection: None  Other Topics Concern  . Not on file  Social History Narrative  . Not on file           Vanessa Kick, MD 06/25/17 [phone]

## 2017-06-25 NOTE — ED Triage Notes (Signed)
Cough, body ache, sneezing, nasal drainage and congestion, fever,

## 2017-06-28 ENCOUNTER — Other Ambulatory Visit: Payer: Self-pay | Admitting: Physician Assistant

## 2017-07-21 ENCOUNTER — Other Ambulatory Visit: Payer: Self-pay

## 2017-07-21 ENCOUNTER — Encounter (HOSPITAL_COMMUNITY): Payer: Self-pay | Admitting: *Deleted

## 2017-07-21 ENCOUNTER — Ambulatory Visit (HOSPITAL_COMMUNITY)
Admission: EM | Admit: 2017-07-21 | Discharge: 2017-07-21 | Disposition: A | Discharge status: Home / Self Care | Payer: BLUE CROSS/BLUE SHIELD | Attending: Family Medicine | Admitting: Family Medicine

## 2017-07-21 DIAGNOSIS — M79605 Pain in left leg: Secondary | ICD-10-CM | POA: Diagnosis not present

## 2017-07-21 NOTE — ED Provider Notes (Signed)
[facility]    CSN: [id number] Arrival date & time: 07/21/17  1557     History   Chief Complaint Chief Complaint  Patient presents with  . Leg Pain    HPI Rockelle Heuerman is a 37 y.o. female.   HPI  The patient woke up this morning with left ankle pain.  Throughout the day, it has progressed to left knee pain that is radiating down her leg.  There is no injury or change in activity.  No change in footwear recently.  She denies any numbness, tingling, or weakness.  Walking has been difficult.  She is working today and has been standing a lot which has been challenging.  She has not had time to try anything at home.  Her joints feel unstable on the left.  Past Medical History:  Diagnosis Date  . Anxiety   . Deviated septum   . GERD (gastroesophageal reflux disease)   . History of bilateral salpingectomy   . Hypertension   . Renal stones   . Sleep apnea    Doesn't use everyday    Patient Active Problem List   Diagnosis Date Noted  . Chest pain 12/10/2014  . GERD (gastroesophageal reflux disease) 12/10/2014  . Chronic diastolic CHF (congestive heart failure) ([place]) 12/10/2014  . Hypertension   . Pain in the chest   . Hypertensive emergency     Past Surgical History:  Procedure Laterality Date  . BILATERAL SALPINGECTOMY  07.2016  . CYSTOSCOPY WITH RETROGRADE PYELOGRAM, URETEROSCOPY AND STENT PLACEMENT Right 07/29/2015   Procedure: CYSTOSCOPY WITH RIGHT RETROGRADE PYELOGRAM, RIGHT URETEROSCOPY AND STENT PLACEMENT;  Surgeon: Irine Seal, MD;  Location: [facility];  Service: Urology;  Laterality: Right;  . ECTOPIC PREGNANCY SURGERY  10/2013  . HOLMIUM LASER APPLICATION Right 12/24/2456   Procedure: HOLMIUM LASER APPLICATION;  Surgeon: Irine Seal, MD;  Location: [facility];  Service: Urology;  Laterality: Right;  . NASAL SEPTUM SURGERY     doen along with tonsillectomy   . TONSILLECTOMY    . TYMPANOSTOMY     x 3    OB History    Gravida Para Term Preterm AB Living   4  3 3   1 3    SAB TAB Ectopic Multiple Live Births       1           Home Medications    Prior to Admission medications   Medication Sig Start Date End Date Taking? Authorizing Provider  ALPRAZolam Duanne Moron) 1 MG tablet Take 0.5-1 tablets by mouth 3 (three) times daily as needed for anxiety. 01/01/17  Yes [provider]  amLODipine (NORVASC) 10 MG tablet Take 1 tablet (10 mg total) by mouth daily. 12/05/14  Yes Idol, Almyra Free, PA-C  lisinopril (PRINIVIL,ZESTRIL) 40 MG tablet Take 40 mg by mouth daily.   Yes [provider]  pantoprazole (PROTONIX) 40 MG tablet Take 1 tablet (40 mg total) by mouth 2 (two) times daily before a meal. TAKE BEFORE BREAKFAST AND DINNER. 01/18/17  Yes Ladene Artist, MD  propranolol (INDERAL) 40 MG tablet Take 90 mg by mouth daily.    Yes [provider]  ranitidine (ZANTAC) 150 MG tablet TAKE ONE TABLET BY MOUTH TWICE DAILY 06/28/17  Yes Esterwood, Amy S, PA-C  sertraline (ZOLOFT) 50 MG tablet Take 50 mg by mouth daily.   Yes [provider]  UNKNOWN TO PATIENT Another new HTN med started 05/2017   Yes [provider]  zolmitriptan (ZOMIG-ZMT) 2.5 MG  disintegrating tablet Take 2.5 mg by mouth as needed for migraine.    [provider]    Family History Family History  Problem Relation Age of Onset  . Hypertension Mother   . Hypertension Father   . Hyperlipidemia Father   . Diabetes Brother   . Diabetes Other     Social History Social History   Tobacco Use  . Smoking status: Current Every Day Smoker    Packs/day: 0.25    Years: 14.00    Pack years: 3.50    Types: Cigarettes    Start date: 10/04/1997    Last attempt to quit: 06/10/2014    Years since quitting: 3.1  . Smokeless tobacco: Never Used  Substance Use Topics  . Alcohol use: No  . Drug use: No     Allergies   Coreg [carvedilol]; Hydrocodeine [dihydrocodeine]; and Labetalol   Review of Systems Review of Systems  Musculoskeletal:        As noted in HPI  Neurological: Negative for weakness.     Physical Exam Triage Vital Signs ED Triage Vitals  Enc Vitals Group     BP 07/21/17 1709 (!) 186/110     Pulse Rate 07/21/17 1709 91     Resp 07/21/17 1709 16     Temp 07/21/17 1709 98.3 F (36.8 C)     Temp src --      SpO2 07/21/17 1709 100 %   Updated Vital Signs BP (!) 186/110 Comment: states taking HTN med; working with PCP to improve it  Pulse 91   Temp 98.3 F (36.8 C)   Resp 16   LMP 07/10/2017 (Approximate)   SpO2 100%   Physical Exam  Constitutional: She is oriented to person, place, and time. She appears well-developed and well-nourished.  Pulmonary/Chest: Effort normal.  Musculoskeletal:  Tender to palpation over the left calf and lateral distal peroneus longus proximal to the ankle; there is no muscle group asymmetry or edema; gait is antalgic Knee: No effusion, no tenderness to palpation over the joint line or bony structures, negative Lachman's, patellar apprehension, patellar grind, McMurray's, varus/valgus Ankle: No effusion, negative anterior drawer, negative squeeze, no proximal fibular head tenderness, normal range of motion  Neurological: She is alert and oriented to person, place, and time. No sensory deficit. She exhibits normal muscle tone. Coordination normal.  Skin: Skin is warm and dry. No erythema.  No excessive warmth  Psychiatric: She has a normal mood and affect. Judgment normal.     UC Treatments / Results  Procedures Procedures  none  Initial Impression / Assessment and Plan / UC Course  I have reviewed the triage vital signs and the nursing notes.  Pertinent labs & imaging results that were available during my care of the patient were reviewed by me and considered in my medical decision making (see chart for details).     37 year old female presents with complaint of muscular skeletal issues.  I question whether she injured her ankle and has been compensating on that leg  causing left knee issues.  I do not believe she has a septic joint or any infectious etiology.  I do not believe she has a DVT given her lack of risk factors such as recent travel, injury, surgery, or prolonged bedrest in addition to exam.  We will give her a knee brace and ankle support.  Ice, heat, anti-inflammatories, Tylenol as needed.  Follow-up with PCP if symptoms worsen or fail to improve.  The patient voiced understanding and  agreement the plan.  Final Clinical Impressions(s) / UC Diagnoses   Final diagnoses:  Left leg pain    Controlled Substance Prescriptions [place] Controlled Substance Registry consulted? Not Applicable   Shelda Pal, [place] 07/21/17 0802

## 2017-07-21 NOTE — Discharge Instructions (Signed)
Heat (pad or rice pillow in microwave) over affected area, 10-15 minutes every 2-3 hours while awake.   Ice/cold pack over area for 10-15 min every 2-3 hours while awake.  Ibuprofen 400-600 mg (2-3 over the counter strength tabs) every 6 hours as needed for pain.  OK to take Tylenol 1000 mg (2 extra strength tabs) or 975 mg (3 regular strength tabs) every 6 hours as needed.

## 2017-07-21 NOTE — ED Triage Notes (Signed)
Denies injury.  States woke up with left ankle pain/cramping.  Throughout day continues and now having pain in left knee radiating down into leg.  Denies parasthesias.  Walks with limp.

## 2017-08-11 ENCOUNTER — Emergency Department (HOSPITAL_COMMUNITY): Payer: BLUE CROSS/BLUE SHIELD

## 2017-08-11 ENCOUNTER — Encounter (HOSPITAL_COMMUNITY): Payer: Self-pay

## 2017-08-11 ENCOUNTER — Emergency Department (HOSPITAL_COMMUNITY)
Admission: EM | Admit: 2017-08-11 | Discharge: 2017-08-11 | Disposition: A | Discharge status: Home / Self Care | Payer: BLUE CROSS/BLUE SHIELD | Attending: Emergency Medicine | Admitting: Emergency Medicine

## 2017-08-11 DIAGNOSIS — F419 Anxiety disorder, unspecified: Secondary | ICD-10-CM | POA: Diagnosis not present

## 2017-08-11 DIAGNOSIS — I5032 Chronic diastolic (congestive) heart failure: Secondary | ICD-10-CM | POA: Insufficient documentation

## 2017-08-11 DIAGNOSIS — Z79899 Other long term (current) drug therapy: Secondary | ICD-10-CM | POA: Insufficient documentation

## 2017-08-11 DIAGNOSIS — F17210 Nicotine dependence, cigarettes, uncomplicated: Secondary | ICD-10-CM | POA: Insufficient documentation

## 2017-08-11 DIAGNOSIS — R079 Chest pain, unspecified: Secondary | ICD-10-CM | POA: Insufficient documentation

## 2017-08-11 DIAGNOSIS — I110 Hypertensive heart disease with heart failure: Secondary | ICD-10-CM | POA: Diagnosis not present

## 2017-08-11 LAB — CBC WITH DIFFERENTIAL/PLATELET
BASOS ABS: 0 10*3/uL (ref 0.0–0.1)
Basophils Relative: 0 %
Eosinophils Absolute: 0 10*3/uL (ref 0.0–0.7)
Eosinophils Relative: 0 %
HEMATOCRIT: 36.7 % (ref 36.0–46.0)
Hemoglobin: 11.3 g/dL — ABNORMAL LOW (ref 12.0–15.0)
LYMPHS PCT: 23 %
Lymphs Abs: 2 10*3/uL (ref 0.7–4.0)
MCH: 27.2 pg (ref 26.0–34.0)
MCHC: 30.8 g/dL (ref 30.0–36.0)
MCV: 88.2 fL (ref 78.0–100.0)
Monocytes Absolute: 0.3 10*3/uL (ref 0.1–1.0)
Monocytes Relative: 4 %
NEUTROS ABS: 6.6 10*3/uL (ref 1.7–7.7)
Neutrophils Relative %: 73 %
Platelets: 248 10*3/uL (ref 150–400)
RBC: 4.16 MIL/uL (ref 3.87–5.11)
RDW: 14.7 % (ref 11.5–15.5)
WBC: 9 10*3/uL (ref 4.0–10.5)

## 2017-08-11 LAB — COMPREHENSIVE METABOLIC PANEL
ALK PHOS: 63 U/L (ref 38–126)
ALT: 13 U/L — AB (ref 14–54)
AST: 16 U/L (ref 15–41)
Albumin: 4 g/dL (ref 3.5–5.0)
Anion gap: 10 (ref 5–15)
BILIRUBIN TOTAL: 0.4 mg/dL (ref 0.3–1.2)
BUN: 9 mg/dL (ref 6–20)
CO2: 24 mmol/L (ref 22–32)
CREATININE: 0.71 mg/dL (ref 0.44–1.00)
Calcium: 9.2 mg/dL (ref 8.9–10.3)
Chloride: 107 mmol/L (ref 101–111)
GFR calc Af Amer: >60 mL/min (ref >60)
Glucose, Bld: 87 mg/dL (ref 65–99)
Potassium: 3.8 mmol/L (ref 3.5–5.1)
Sodium: 141 mmol/L (ref 135–145)
TOTAL PROTEIN: 7 g/dL (ref 6.5–8.1)

## 2017-08-11 LAB — I-STAT TROPONIN, ED
TROPONIN I, POC: 0 ng/mL (ref 0.00–0.08)
Troponin i, poc: 0 ng/mL (ref 0.00–0.08)

## 2017-08-11 LAB — I-STAT BETA HCG BLOOD, ED (MC, WL, AP ONLY)

## 2017-08-11 IMAGING — DX DG CHEST 2V
2 series · 2 of 2 positions shown · non-contrast
Comparison: [DATE]

CLINICAL DATA: Possible panic attack.  Chest pain.

EXAM:
CHEST  2 VIEW

[chest pa]
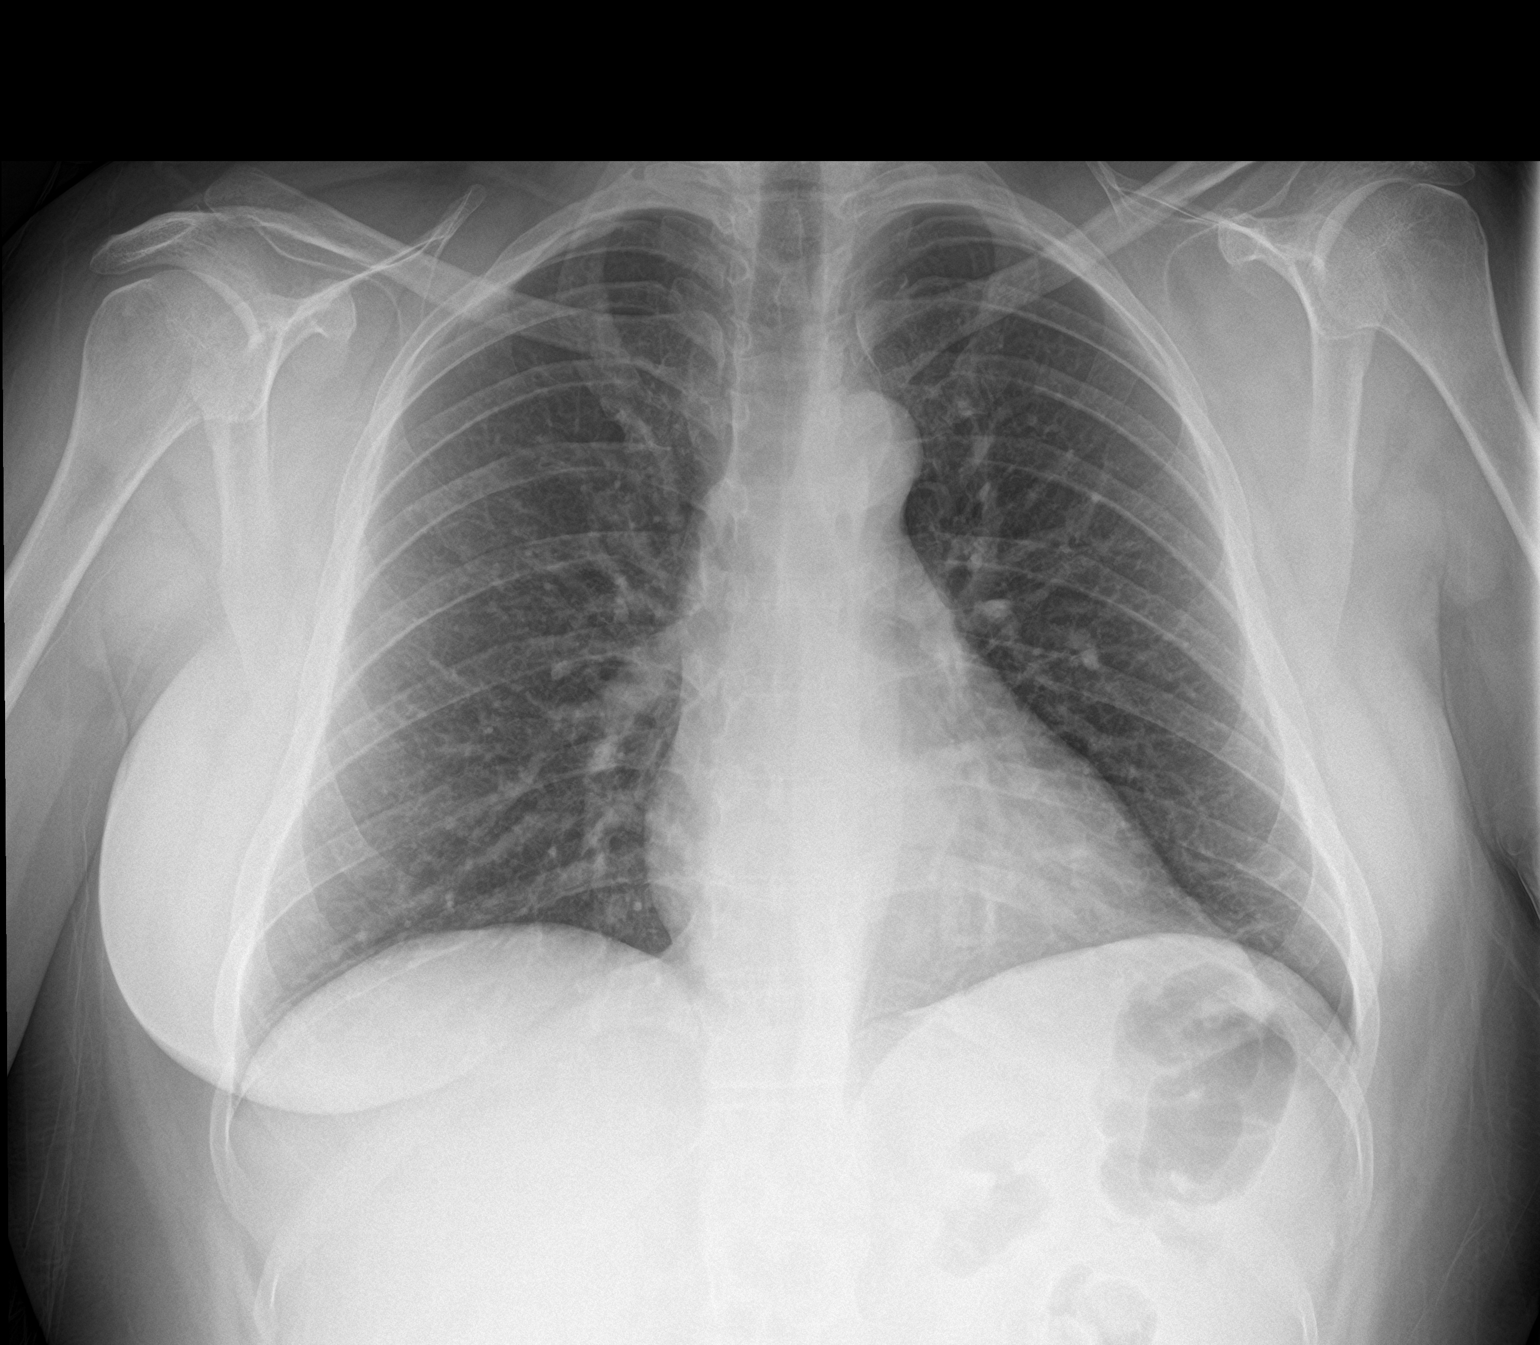

[chest lat]
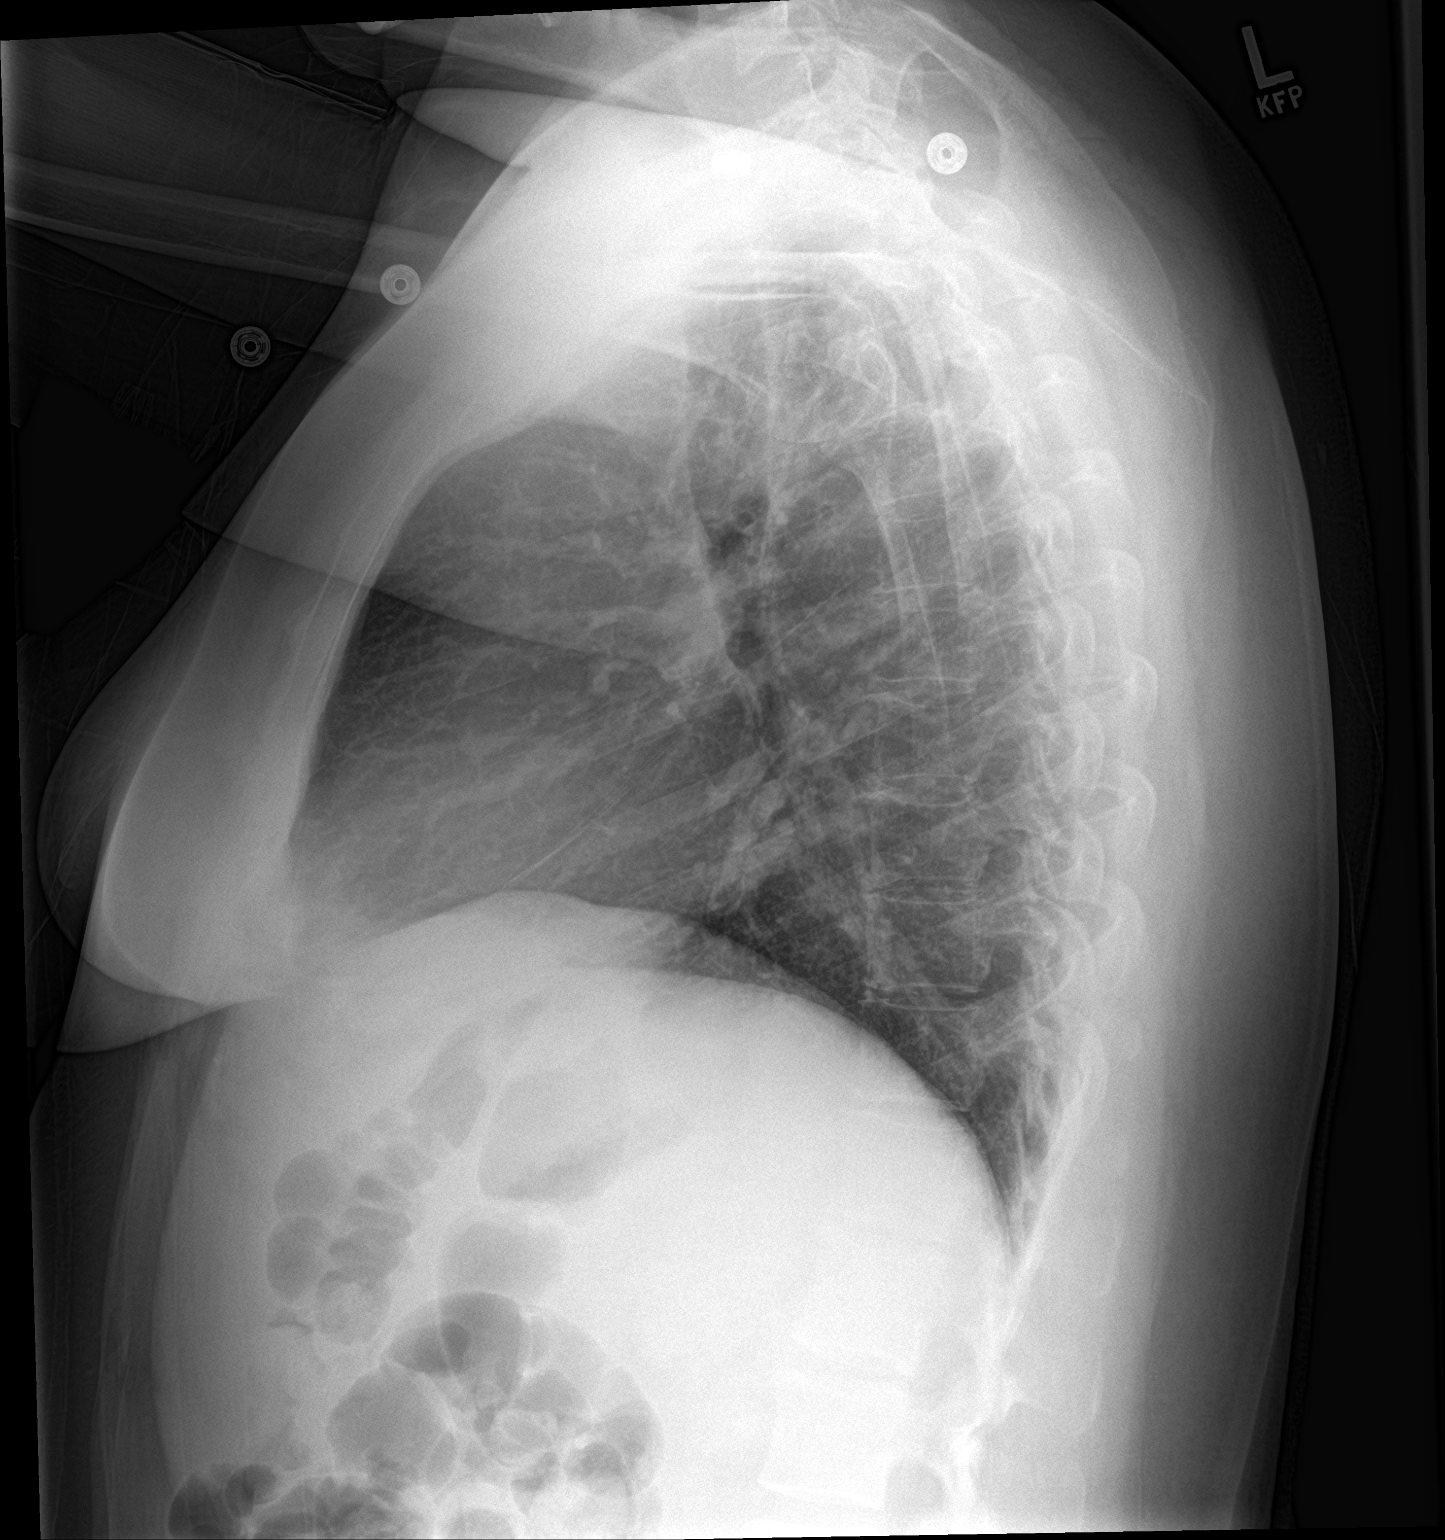

[2 of 2 positions shown; findings below may reference images not displayed]

FINDINGS: The heart size and mediastinal contours are within normal limits.
Both lungs are clear. The visualized skeletal structures are
unremarkable.
IMPRESSION: No active cardiopulmonary disease.

## 2017-08-11 MED ORDER — AMLODIPINE BESYLATE 5 MG PO TABS
5.0000 mg | ORAL_TABLET | Freq: Once | ORAL | Status: AC
  Administered 2017-08-11: 5 mg via ORAL
  Filled 2017-08-11: qty 1

## 2017-08-11 MED ORDER — KETOROLAC TROMETHAMINE 60 MG/2ML IM SOLN: 60.0000 mg | Freq: Once | INTRAMUSCULAR | Status: DC

## 2017-08-11 MED ORDER — KETOROLAC TROMETHAMINE 30 MG/ML IJ SOLN
30.0000 mg | Freq: Once | INTRAMUSCULAR | Status: AC
  Administered 2017-08-11: 30 mg via INTRAVENOUS
  Filled 2017-08-11: qty 1

## 2017-08-11 NOTE — ED Triage Notes (Signed)
Pt presents for evaluation of possible panic attack today. Pt reports starting having chest pain with heaviness x 2-3 min that resolved. Pt felt diaphoretic. Pt received 324 ASA in route. Pt states symptoms have resolved, hx of same and takes xanax and propanolol.

## 2017-08-11 NOTE — ED Notes (Signed)
E-sign pad not available. Patient verbalized understanding.

## 2017-08-11 NOTE — Discharge Instructions (Signed)
Follow-up with your primary care doctor in the next 24-48 hours for further evaluation.   Take your me

## 2017-08-11 NOTE — ED Notes (Signed)
Called x 2 NO answer 

## 2017-08-11 NOTE — ED Provider Notes (Signed)
[facility] EMERGENCY DEPARTMENT Provider Note   CSN: [id number] Arrival date & time: 08/11/17  1318     History   Chief Complaint Chief Complaint  Patient presents with  . Anxiety    HPI Maria Lang is a 37 y.o. female has medical history of anxiety, GERD, hypertension who presents for evaluation of chest pain and anxiety that began approximately 12 PM this afternoon.  Patient states that she was at work when she started experiencing mid chest pain that she described pressure.  She states that the pain lasted approximately 2-3 minutes before resolving on its own.  She states that she does not recall if the pain was worse with deep inspiration.  She states that she sat down so she does not know if the pain was worsened with exertion.  Patient states that with the pain, she felt diaphoretic and then started having a panic attack.  Denies any nausea or vomiting.  Patient states that she started getting very anxious and concerned and began feeling like her hands were shaking.  Patient reports that this is consistent with her previous panic attacks.  Patient received 324 ASA in route.  Patient reports that by that time the chest pain and artery resolved so she does not of the aspirin helped.  Patient reports that she has a history of panic attacks and gets them very frequently.  She states that she has had one previous episode that similar to today's with chest pain that occurred approximately 5-6 months ago.  ED arrival, she states that symptoms have completely resolved. Patient reports that she does not smoke.  She denies any cocaine, heroin, marijuana use.  She denies any IV drug use.  denies any OCP use, recent immobilization, prior history of DVT/PE, recent surgery, leg swelling, or long travel.  The history is provided by the patient.    Past Medical History:  Diagnosis Date  . Anxiety   . Deviated septum   . GERD (gastroesophageal reflux disease)   . History of  bilateral salpingectomy   . Hypertension   . Renal stones   . Sleep apnea    Doesn't use everyday    Patient Active Problem List   Diagnosis Date Noted  . Chest pain 12/10/2014  . GERD (gastroesophageal reflux disease) 12/10/2014  . Chronic diastolic CHF (congestive heart failure) ([place]) 12/10/2014  . Hypertension   . Pain in the chest   . Hypertensive emergency     Past Surgical History:  Procedure Laterality Date  . BILATERAL SALPINGECTOMY  07.2016  . CYSTOSCOPY WITH RETROGRADE PYELOGRAM, URETEROSCOPY AND STENT PLACEMENT Right 07/29/2015   Procedure: CYSTOSCOPY WITH RIGHT RETROGRADE PYELOGRAM, RIGHT URETEROSCOPY AND STENT PLACEMENT;  Surgeon: Irine Seal, MD;  Location: [facility];  Service: Urology;  Laterality: Right;  . ECTOPIC PREGNANCY SURGERY  10/2013  . HOLMIUM LASER APPLICATION Right 09/27/345   Procedure: HOLMIUM LASER APPLICATION;  Surgeon: Irine Seal, MD;  Location: [facility];  Service: Urology;  Laterality: Right;  . NASAL SEPTUM SURGERY     doen along with tonsillectomy   . TONSILLECTOMY    . TYMPANOSTOMY     x 3    OB History    Gravida Para Term Preterm AB Living   4 3 3   1 3    SAB TAB Ectopic Multiple Live Births       1           Home Medications    Prior to Admission medications   Medication  Sig Start Date End Date Taking? Authorizing Provider  ALPRAZolam Duanne Moron) 1 MG tablet Take 0.5-1 tablets by mouth 3 (three) times daily as needed for anxiety. 01/01/17   [provider]  amLODipine (NORVASC) 10 MG tablet Take 1 tablet (10 mg total) by mouth daily. 12/05/14   Evalee Jefferson, PA-C  lisinopril (PRINIVIL,ZESTRIL) 40 MG tablet Take 40 mg by mouth daily.    [provider]  pantoprazole (PROTONIX) 40 MG tablet Take 1 tablet (40 mg total) by mouth 2 (two) times daily before a meal. TAKE BEFORE BREAKFAST AND DINNER. 01/18/17   Ladene Artist, MD  propranolol (INDERAL) 40 MG tablet Take 90 mg by mouth daily.     [provider]  ranitidine  (ZANTAC) 150 MG tablet TAKE ONE TABLET BY MOUTH TWICE DAILY 06/28/17   Esterwood, Amy S, PA-C  sertraline (ZOLOFT) 50 MG tablet Take 50 mg by mouth daily.    [provider]  UNKNOWN TO PATIENT Another new HTN med started 05/2017    [provider]  zolmitriptan (ZOMIG-ZMT) 2.5 MG disintegrating tablet Take 2.5 mg by mouth as needed for migraine.    [provider]    Family History Family History  Problem Relation Age of Onset  . Hypertension Mother   . Hypertension Father   . Hyperlipidemia Father   . Diabetes Brother   . Diabetes Other     Social History Social History   Tobacco Use  . Smoking status: Current Every Day Smoker    Packs/day: 0.25    Years: 14.00    Pack years: 3.50    Types: Cigarettes    Start date: 10/04/1997    Last attempt to quit: 06/10/2014    Years since quitting: 3.1  . Smokeless tobacco: Never Used  Substance Use Topics  . Alcohol use: No  . Drug use: No     Allergies   Coreg [carvedilol]; Hydrocodeine [dihydrocodeine]; and Labetalol   Review of Systems Review of Systems  Constitutional: Positive for diaphoresis. Negative for chills and fever.  Eyes: Negative for visual disturbance.  Respiratory: Negative for cough and shortness of breath.   Cardiovascular: Positive for chest pain.  Gastrointestinal: Negative for abdominal pain, diarrhea, nausea and vomiting.  Musculoskeletal: Negative for back pain and neck pain.  Skin: Negative for rash.  Neurological: Negative for dizziness, weakness, numbness and headaches.  Psychiatric/Behavioral: Negative for confusion. The patient is nervous/anxious.      Physical Exam Updated Vital Signs BP (!) 191/121   Pulse 84   Temp 97.9 F (36.6 C) (Oral)   Resp 16   LMP 08/04/2017   SpO2 100%   Physical Exam  Constitutional: She is oriented to person, place, and time. She appears well-developed and well-nourished.  Sitting comfortably on examination table  HENT:  Head:  Normocephalic and atraumatic.  Mouth/Throat: Oropharynx is clear and moist and mucous membranes are normal.  Eyes: Conjunctivae, EOM and lids are normal. Pupils are equal, round, and reactive to light.  Neck: Full passive range of motion without pain.  Cardiovascular: Normal rate, regular rhythm and normal heart sounds. Exam reveals no gallop and no friction rub.  No murmur heard. Pulses:      Radial pulses are 2+ on the right side, and 2+ on the left side.  Pulmonary/Chest: Effort normal and breath sounds normal.  No evidence of respiratory distress. Able to speak in full sentences without difficulty.  Abdominal: Soft. Normal appearance. There is no tenderness. There is no rigidity and  no guarding.  Musculoskeletal: Normal range of motion.  Bilateral lower extremities are symmetric in appearance.  No evidence of edema.  Neurological: She is alert and oriented to person, place, and time.  Skin: Skin is warm and dry. Capillary refill takes less than 2 seconds.  Psychiatric: She has a normal mood and affect. Her speech is normal.  Nursing note and vitals reviewed.    ED Treatments / Results  Labs (all labs ordered are listed, but only abnormal results are displayed) Labs Reviewed  CBC WITH DIFFERENTIAL/PLATELET - Abnormal; Notable for the following components:      Result Value   Hemoglobin 11.3 (*)    All other components within normal limits  COMPREHENSIVE METABOLIC PANEL - Abnormal; Notable for the following components:   ALT 13 (*)    All other components within normal limits  I-STAT BETA HCG BLOOD, ED ([facility], [facility], [facility] ONLY)  I-STAT TROPONIN, ED  I-STAT TROPONIN, ED    EKG  EKG Interpretation  Date/Time:  Saturday August 11 2017 17:39:48 EST Ventricular Rate:  83 PR Interval:  170 QRS Duration: 82 QT Interval:  396 QTC Calculation: 465 R Axis:   -12 Text Interpretation:  Normal sinus rhythm Possible Left atrial enlargement Left ventricular hypertrophy with repolarization  abnormality Abnormal ECG No significant change since last tracing Confirmed by Gareth Morgan [phone]) on 08/11/2017 7:30:26 PM       Radiology Dg Chest 2 View  Result Date: 08/11/2017 CLINICAL DATA:  Possible panic attack.  Chest pain. EXAM: CHEST  2 VIEW COMPARISON:  April 27, 2017 FINDINGS: The heart size and mediastinal contours are within normal limits. Both lungs are clear. The visualized skeletal structures are unremarkable. IMPRESSION: No active cardiopulmonary disease. Electronically Signed   By: Dorise Bullion III M.D   On: 08/11/2017 16:13    Procedures Procedures (including critical care time)  Medications Ordered in ED Medications  ketorolac (TORADOL) 30 MG/ML injection 30 mg (30 mg Intravenous Given 08/11/17 1805)  amLODipine (NORVASC) tablet 5 mg (5 mg Oral Given 08/11/17 1918)     Initial Impression / Assessment and Plan / ED Course  I have reviewed the triage vital signs and the nursing notes.  Pertinent labs & imaging results that were available during my care of the patient were reviewed by me and considered in my medical decision making (see chart for details).     37 y.o. F past medical history of anxiety, hypertension who presents for evaluation of mid chest pain that began at approximately 12 PM this afternoon.  Did have associated diaphoresis.  No nausea reports that then she had a panic attack and became very anxious.  She has a history of panic attacks and states that it felt similar.  She has had a panic attack with chest pain before approximately 5 months ago. Patient is afebrile, non-toxic appearing, sitting comfortably on examination table. Vital signs reviewed.  Patient is hypertensive.  She does have a history of hypertension and is on medication.  She states that she took her medication today.  No symptoms currently in the ED.  Consider ACS etiology versus anxiety versus initial labs and imaging ordered at triage.  I-STAT beta negative.  Troponin  negative.  CMP without any acute abnormalities.  CBC is unremarkable.  EKG shows sinus rhythm, rate 87.  Chest x-ray negative for any acute infectious etiology.   Patient's initial blood pressure was high.  She does have a history of hypertension.  She is currently on 3  antihypertensives and states that she has been working with her primary care doctor to lower her blood pressure.  She states that her blood pressure typically runs between 160s-180s despite taking medication.  Patient does endorse a slight headache since ED arrival but denies any chest pain.  Do not think the patient has hypertensive emergency at this time.  We will plan to treat the headache and reassess blood pressure.  Patient received analgesics for headache and blood pressure improved from 115 systolic to 726 systolic.   Given patient's history/physical exam and risk factors she has a HEART score of 2.  Plan to delta troponin patient.  Delta troponin negative.  Patient denies any chest pain at this time.  Patient is still slightly hypertensive but denies any symptoms at this time.  No neuro deficits noted.  Patient still hypertensive in the ED.  She went from 175-200 upon time of discharge.  We will plan to give a small dose of amlodipine as that is when the patient's antihypertensive medications and reassess.  I do not suspect hypertensive emergency at this time as patient has no complaints of headache, vision changes, chest pain, numbness/weakness of her arms or legs.  I suspect that this is a history of patient's ongoing issues with hypertension.  Additionally, exam is not concerning for aortic dissection.   Reevaluation.  Blood pressure has slightly improved after antihypertensive.  Patient denying any symptoms.  Vital signs stable.  Patient stable for discharge at this time.  Patient instructed to follow-up with primary care doctor regarding control of her blood pressure. Patient had ample opportunity for questions and  discussion. All patient's questions were answered with full understanding. Strict return precautions discussed. Patient expresses understanding and agreement to plan.    Final Clinical Impressions(s) / ED Diagnoses   Final diagnoses:  Chest pain, unspecified type  Anxiety    ED Discharge Orders    None       Desma Mcgregor 08/12/17 1717    Gareth Morgan, MD 08/12/17 2127

## 2017-08-11 NOTE — ED Notes (Signed)
Patient transported to X-ray 

## 2017-09-02 ENCOUNTER — Other Ambulatory Visit: Payer: Self-pay | Admitting: Physician Assistant

## 2017-10-16 ENCOUNTER — Ambulatory Visit: Payer: BLUE CROSS/BLUE SHIELD | Admitting: Physician Assistant

## 2017-10-16 ENCOUNTER — Encounter: Payer: Self-pay | Admitting: Physician Assistant

## 2017-10-16 ENCOUNTER — Encounter (INDEPENDENT_AMBULATORY_CARE_PROVIDER_SITE_OTHER): Payer: Self-pay

## 2017-10-16 VITALS — BP 150/104 | HR 88 | Ht 66.5 in | Wt 175.4 lb

## 2017-10-16 DIAGNOSIS — R1013 Epigastric pain: Secondary | ICD-10-CM | POA: Diagnosis not present

## 2017-10-16 DIAGNOSIS — K2100 Gastro-esophageal reflux disease with esophagitis, without bleeding: Secondary | ICD-10-CM

## 2017-10-16 DIAGNOSIS — R112 Nausea with vomiting, unspecified: Secondary | ICD-10-CM | POA: Diagnosis not present

## 2017-10-16 MED ORDER — ONDANSETRON 4 MG PO TBDP: 4.0000 mg | ORAL_TABLET | Freq: Four times a day (QID) | ORAL | 2 refills | Status: DC | PRN

## 2017-10-16 NOTE — Progress Notes (Signed)
Chief Complaint: Nausea and vomiting  HPI:     Maria Lang is a 37 year old female with a past medical history of anxiety and others listed below, who follows with Dr. Fuller Plan and presents to clinic today with a complaint of nausea and vomiting.     Previous CT of the abdomen and pelvis January 2017 for similar symptoms showing bilateral nonobstructing kidney stones and mild enlargement of the right ureter with mild periureteral edema.    01/18/17 EGD for GERD nausea and vomiting with LA grade B reflux esophagitis, gastritis and a small hiatal hernia.  Twice daily PPI recommended.  12/07/16 abdominal ultrasound with no gallstones but a fatty liver.    10/01/17 office visit PCP for nausea and vomiting.  Labs 09/27/17 normal CBC and CMP.  Nausea and vomiting were improving at that office visit.  Encourage brat diet advance as tolerated.    Today, explains that her symptoms have worsened.  Used to have episodes of vomiting 2 days out of the week, now occurring 5/7 days of the week.  Describes this will come on typically in the afternoon/evening and she will have at least a 3-4 episodes where she has to go to the bathroom and vomit for sometimes 10-15 minutes at a time.  Does use Zofran 4 mg which tends to "take the edge off".  Describes using Zofran sometimes 3-4 times a day.  Has never scheduled this.  Describes being seen in the ER on multiple occasions between the end of March and now for dehydration related to vomiting.  Patient tells me that she will feel nauseous and then urgently have to vomit.  Sometimes smelling food brings on this feeling.  Other times she can have nothing to eat at all and feel nauseous.  Describes sometimes being accompanied by diarrheal bowel movement and other times has regular solid stools.  Also some breakthrough heartburn symptoms irregardless of Pantoprazole 40 mg twice daily and Ranitidine 150 mg twice daily.  Sometimes these episodes of vomiting wake her from her sleep.   Associated symptoms include epigastric discomfort worse with vomiting.  This all started about a year ago. No emotional triggers.    Denies fever, chills, weight loss, anorexia or dysphagia.  Past Medical History:  Diagnosis Date  . Anxiety   . Deviated septum   . GERD (gastroesophageal reflux disease)   . History of bilateral salpingectomy   . Hypertension   . Renal stones   . Sleep apnea    Doesn't use everyday    Past Surgical History:  Procedure Laterality Date  . BILATERAL SALPINGECTOMY  07.2016  . CYSTOSCOPY WITH RETROGRADE PYELOGRAM, URETEROSCOPY AND STENT PLACEMENT Right 07/29/2015   Procedure: CYSTOSCOPY WITH RIGHT RETROGRADE PYELOGRAM, RIGHT URETEROSCOPY AND STENT PLACEMENT;  Surgeon: Irine Seal, MD;  Location: [facility];  Service: Urology;  Laterality: Right;  . ECTOPIC PREGNANCY SURGERY  10/2013  . HOLMIUM LASER APPLICATION Right 06/29/9700   Procedure: HOLMIUM LASER APPLICATION;  Surgeon: Irine Seal, MD;  Location: [facility];  Service: Urology;  Laterality: Right;  . NASAL SEPTUM SURGERY     doen along with tonsillectomy   . TONSILLECTOMY    . TYMPANOSTOMY     x 3    Current Outpatient Medications  Medication Sig Dispense Refill  . ALPRAZolam (XANAX) 1 MG tablet Take 0.5-1 tablets by mouth 3 (three) times daily as needed for anxiety.  1  . amLODipine (NORVASC) 10 MG tablet Take 1 tablet (10 mg total) by mouth daily. 30 tablet  0  . lisinopril (PRINIVIL,ZESTRIL) 40 MG tablet Take 40 mg by mouth daily.    . pantoprazole (PROTONIX) 40 MG tablet Take 1 tablet (40 mg total) by mouth 2 (two) times daily before a meal. TAKE BEFORE BREAKFAST AND DINNER. 180 tablet 3  . propranolol (INDERAL) 40 MG tablet Take 90 mg by mouth daily.     . ranitidine (ZANTAC) 150 MG tablet TAKE ONE TABLET BY MOUTH TWICE DAILY 60 tablet 6  . sertraline (ZOLOFT) 50 MG tablet Take 50 mg by mouth daily.    [name] Kitchen UNKNOWN TO PATIENT Another new HTN med started 05/2017    . zolmitriptan (ZOMIG-ZMT) 2.5 MG  disintegrating tablet Take 2.5 mg by mouth as needed for migraine.     Current Facility-Administered Medications  Medication Dose Route Frequency Provider Last Rate Last Dose  . 0.9 %  sodium chloride infusion  500 mL Intravenous Continuous Ladene Artist, MD        Allergies as of 10/16/2017 - Review Complete 10/16/2017  Allergen Reaction Noted  . Coreg [carvedilol]  12/10/2014  . Hydrocodeine [dihydrocodeine] Nausea And Vomiting 06/04/2015  . Labetalol Other (See Comments) 02/17/2016    Family History  Problem Relation Age of Onset  . Hypertension Mother   . Hypertension Father   . Hyperlipidemia Father   . Diabetes Brother   . Diabetes Other     Social History   Socioeconomic History  . Marital status: Married    Spouse name: Not on file  . Number of children: Not on file  . Years of education: Not on file  . Highest education level: Not on file  Occupational History  . Not on file  Social Needs  . Financial resource strain: Not on file  . Food insecurity:    Worry: Not on file    Inability: Not on file  . Transportation needs:    Medical: Not on file    Non-medical: Not on file  Tobacco Use  . Smoking status: Current Every Day Smoker    Packs/day: 0.25    Years: 14.00    Pack years: 3.50    Types: Cigarettes    Start date: 10/04/1997    Last attempt to quit: 06/10/2014    Years since quitting: 3.3  . Smokeless tobacco: Never Used  Substance and Sexual Activity  . Alcohol use: No  . Drug use: No  . Sexual activity: Not on file  Lifestyle  . Physical activity:    Days per week: Not on file    Minutes per session: Not on file  . Stress: Not on file  Relationships  . Social connections:    Talks on phone: Not on file    Gets together: Not on file    Attends religious service: Not on file    Active member of club or organization: Not on file    Attends meetings of clubs or organizations: Not on file    Relationship status: Not on file  . Intimate  partner violence:    Fear of current or ex partner: Not on file    Emotionally abused: Not on file    Physically abused: Not on file    Forced sexual activity: Not on file  Other Topics Concern  . Not on file  Social History Narrative  . Not on file    Review of Systems:    Constitutional: No weight loss, fever or chills Cardiovascular: No chest pain Respiratory: No SOB Gastrointestinal: See HPI and otherwise  negative   Physical Exam:  Vital signs: BP (!) 150/104   Pulse 88   Ht 5' 6.5" (1.689 m)   Wt 175 lb 6 oz (79.5 kg)   BMI 27.88 kg/m   Constitutional:   Pleasant Caucasian female appears to be in NAD, Well developed, Well nourished, alert and cooperative Respiratory: Respirations even and unlabored. Lungs clear to auscultation bilaterally.   No wheezes, crackles, or rhonchi.  Cardiovascular: Normal S1, S2. No MRG. Regular rate and rhythm. No peripheral edema, cyanosis or pallor.  Gastrointestinal:  Soft, nondistended, mild epigastric TTP. No rebound or guarding. Normal bowel sounds. No appreciable masses or hepatomegaly. Psychiatric: Demonstrates good judgement and reason without abnormal affect or behaviors.  RELEVANT LABS AND IMAGING: CBC    Component Value Date/Time   WBC 9.0 08/11/2017 1512   RBC 4.16 08/11/2017 1512   HGB 11.3 (L) 08/11/2017 1512   HCT 36.7 08/11/2017 1512   PLT 248 08/11/2017 1512   MCV 88.2 08/11/2017 1512   MCH 27.2 08/11/2017 1512   MCHC 30.8 08/11/2017 1512   RDW 14.7 08/11/2017 1512   LYMPHSABS 2.0 08/11/2017 1512   MONOABS 0.3 08/11/2017 1512   EOSABS 0.0 08/11/2017 1512   BASOSABS 0.0 08/11/2017 1512    CMP     Component Value Date/Time   NA 141 08/11/2017 1512   K 3.8 08/11/2017 1512   CL 107 08/11/2017 1512   CO2 24 08/11/2017 1512   GLUCOSE 87 08/11/2017 1512   BUN 9 08/11/2017 1512   CREATININE 0.71 08/11/2017 1512   CALCIUM 9.2 08/11/2017 1512   PROT 7.0 08/11/2017 1512   ALBUMIN 4.0 08/11/2017 1512   AST 16  08/11/2017 1512   ALT 13 (L) 08/11/2017 1512   ALKPHOS 63 08/11/2017 1512   BILITOT 0.4 08/11/2017 1512   GFRNONAA >60 08/11/2017 1512   GFRAA >60 08/11/2017 1512    Assessment: 1.  Nausea and vomiting: Cyclical episodes, increasing over the past year, now 5/7 days/week, no triggers, Zofran 4 mg does help, EGD 2018 with mild gastritis and esophagitis, CT and ultrasound with no etiology, labs normal; consider cyclical vomiting syndrome versus gastroparesis 2.  Epigastric pain: With above  Plan: 1.  Scheduled patient for gastric emptying study. 2.  If above is negative/normal could consider CT of the head 3.  Recommend patient schedule her Zofran 4 mg every 6 hours prescribed #120 with 1 refill 4.  Patient to continue Pantoprazole 40 mg twice daily and Ranitidine 150 mg twice daily 5.  Patient has had extensive workup with no etiology, recommend she follow-up with Dr. Fuller Plan at her next office visit.  She was scheduled for his next available time.  Ellouise Newer, PA-C [facility] 10/16/2017, 9:37 AM  Cc: London Pepper, MD

## 2017-10-16 NOTE — Progress Notes (Signed)
Reviewed and agree with initial management plan.  Nilan Iddings T. Ulyses Panico, MD FACG 

## 2017-10-16 NOTE — Patient Instructions (Signed)
You have been scheduled for a gastric emptying scan at [facility] on 10-26-17 at 7:30 am. Please arrive at least 15 minutes prior to your appointment for registration. Please make certain not to have anything to eat or drink after midnight the night before your test. Hold all stomach medications (ex: Zofran, phenergan, Reglan, ranitidine) 8 hours prior to your test. If you need to reschedule your appointment, please contact radiology scheduling at [phone]. _____________________________________________________________________ A gastric-emptying study measures how long it takes for food to move through your stomach. There are several ways to measure stomach emptying. In the most common test, you eat food that contains a small amount of radioactive material. A scanner that detects the movement of the radioactive material is placed over your abdomen to monitor the rate at which food leaves your stomach. This test normally takes about 4 hours to complete. _____________________________________________________________________  We have sent the following medications to your pharmacy for you to pick up at your convenience: Zofran 4 mg every 6 hours

## 2017-10-26 ENCOUNTER — Ambulatory Visit (HOSPITAL_COMMUNITY)
Admission: RE | Admit: 2017-10-26 | Discharge: 2017-10-26 | Disposition: A | Discharge status: Home / Self Care | Payer: BLUE CROSS/BLUE SHIELD | Source: Ambulatory Visit | Attending: Physician Assistant | Admitting: Physician Assistant

## 2017-10-26 DIAGNOSIS — R112 Nausea with vomiting, unspecified: Secondary | ICD-10-CM | POA: Diagnosis not present

## 2017-10-26 MED ORDER — TECHNETIUM TC 99M SULFUR COLLOID
2.0000 | Freq: Once | INTRAVENOUS | Status: AC | PRN
  Administered 2017-10-26: 2 via INTRAVENOUS

## 2017-11-02 ENCOUNTER — Telehealth: Payer: Self-pay | Admitting: Physician Assistant

## 2017-11-02 NOTE — Telephone Encounter (Signed)
The pt has been advised Maria Lang is not in the office and we will call her as soon as reviewed.  I did advise it will be Monday or Tuesday.

## 2017-11-02 NOTE — Telephone Encounter (Signed)
Patient states she had a gastric emptying done on 5.3.19 and has not received results. Pt would like a call to discuss.

## 2017-11-23 ENCOUNTER — Ambulatory Visit: Payer: BLUE CROSS/BLUE SHIELD | Admitting: Gastroenterology

## 2018-01-04 ENCOUNTER — Encounter

## 2018-01-09 ENCOUNTER — Other Ambulatory Visit: Payer: Self-pay | Admitting: Physician Assistant

## 2018-02-07 ENCOUNTER — Other Ambulatory Visit: Payer: Self-pay | Admitting: Gastroenterology

## 2018-02-07 DIAGNOSIS — K2950 Unspecified chronic gastritis without bleeding: Secondary | ICD-10-CM

## 2018-08-06 ENCOUNTER — Other Ambulatory Visit: Payer: Self-pay | Admitting: Gastroenterology

## 2018-08-06 DIAGNOSIS — K2950 Unspecified chronic gastritis without bleeding: Secondary | ICD-10-CM

## 2018-08-21 ENCOUNTER — Other Ambulatory Visit: Payer: Self-pay | Admitting: Otolaryngology

## 2018-08-21 DIAGNOSIS — H663X2 Other chronic suppurative otitis media, left ear: Secondary | ICD-10-CM

## 2018-08-22 ENCOUNTER — Ambulatory Visit (HOSPITAL_COMMUNITY): Payer: BLUE CROSS/BLUE SHIELD | Attending: Physician Assistant | Admitting: Physical Therapy

## 2018-08-22 ENCOUNTER — Encounter (HOSPITAL_COMMUNITY): Payer: Self-pay | Admitting: Physical Therapy

## 2018-08-22 ENCOUNTER — Other Ambulatory Visit: Payer: Self-pay

## 2018-08-22 DIAGNOSIS — M6281 Muscle weakness (generalized): Secondary | ICD-10-CM | POA: Insufficient documentation

## 2018-08-22 DIAGNOSIS — M545 Low back pain, unspecified: Secondary | ICD-10-CM

## 2018-08-22 NOTE — Patient Instructions (Addendum)
Bridging    Slowly raise buttocks from floor, keeping stomach tight. Repeat ____ times per set. Do ____ sets per session. Do ____ sessions per day.  http://orth.exer.us/1096   Copyright  VHI. All rights reserved.  Strengthening: Hip Adduction - Isometric    With ball or folded pillow between knees, squeeze knees together. Hold _3___ seconds. Repeat 10____ times per set. Do _1___ sets per session. Do _2___ sessions per day.  http://orth.exer.us/612   Copyright  VHI. All rights reserved.

## 2018-08-22 NOTE — Therapy (Addendum)
[place], [place], [address] Phone: [phone]   Fax:  [phone]  Physical Therapy Evaluation  Patient Details  Name: Maria Lang MRN: [id number] Date of Birth: [birth year] Referring Provider (PT): Kathleen Lime   Encounter Date: 08/22/2018  PT End of Session - 08/22/18 1621    Visit Number  1    Number of Visits  8    Date for PT Re-Evaluation  09/21/18    Authorization Type  BCBS    Authorization - Visit Number  1    Authorization - Number of Visits 30   PT Start Time  6387    PT Stop Time  1430    PT Time Calculation (min)  45 min    Activity Tolerance  Patient tolerated treatment well    Behavior During Therapy  [facility] for tasks assessed/performed       Past Medical History:  Diagnosis Date  . Anxiety   . Deviated septum   . GERD (gastroesophageal reflux disease)   . History of bilateral salpingectomy   . Hypertension   . Renal stones   . Sleep apnea    Doesn't use everyday    Past Surgical History:  Procedure Laterality Date  . BILATERAL SALPINGECTOMY  07.2016  . CYSTOSCOPY WITH RETROGRADE PYELOGRAM, URETEROSCOPY AND STENT PLACEMENT Right 07/29/2015   Procedure: CYSTOSCOPY WITH RIGHT RETROGRADE PYELOGRAM, RIGHT URETEROSCOPY AND STENT PLACEMENT;  Surgeon: Irine Seal, MD;  Location: [facility];  Service: Urology;  Laterality: Right;  . ECTOPIC PREGNANCY SURGERY  10/2013  . HOLMIUM LASER APPLICATION Right 10/30/4330   Procedure: HOLMIUM LASER APPLICATION;  Surgeon: Irine Seal, MD;  Location: [facility];  Service: Urology;  Laterality: Right;  . NASAL SEPTUM SURGERY     doen along with tonsillectomy   . TONSILLECTOMY    . TYMPANOSTOMY     x 3    There were no vitals filed for this visit.   Subjective Assessment - 08/22/18 1359    Subjective  Maria Lang states that she has been having low back pain for about a year but it recently flared up.  She has tried antiinflammatory medication with no  improvement.  She hurts equally on both sides of her low back.  She denies radicular sx.       Limitations  Sitting    How long can you sit comfortably?  aggrevates after a couple of hours.      How long can you stand comfortably?  able to stand for 15-20 minutes and then the pain increases but she will continue with the task.    How long can you walk comfortably?  PT does not walk exercise wise.  She does not notice increase back pain with walking     Patient Stated Goals  less pain,     Currently in Pain?  Yes    Pain Score  5    pain can go as high as a 10/10 will go down to a 2-3   Pain Location  Back    Pain Orientation  Lower    Pain Descriptors / Indicators  Burning    Pain Type  Chronic pain    Pain Radiating Towards  none     Pain Onset  More than a month ago    Pain Frequency  Constant    Aggravating Factors   moving heavy things, vaccum and mopping     Pain Relieving Factors  nothing so far  [facility] PT Assessment - 08/22/18 0001      Assessment   Medical Diagnosis  Low back pain     Referring Provider (PT)  Kathleen Lime    Onset Date/Surgical Date  06/26/18    Next MD Visit  not schedule     Prior Therapy  none      Precautions   Precautions  None      Restrictions   Weight Bearing Restrictions  No      Balance Screen   Has the patient fallen in the past 6 months  No    Has the patient had a decrease in activity level because of a fear of falling?   Yes    Is the patient reluctant to leave their home because of a fear of falling?   No      Home Film/video editor residence    Home Access  Stairs to enter    Entrance Stairs-Number of Steps  3    Entrance Stairs-Rails  Right    Home Layout  One level      Prior Function   Level of [place]  Unemployed    Leisure  sedentary       Cognition   Overall Cognitive Status  Within Functional Limits for tasks assessed      Observation/Other Assessments    Focus on Therapeutic Outcomes (FOTO)   65      Posture/Postural Control   Posture Comments  RT ASIS high/ PSIS LOW       ROM / Strength   AROM / PROM / Strength  AROM;Strength      AROM   AROM Assessment Site  Lumbar    Lumbar Flexion  finger tips 10" from floor : reps no change     Lumbar Extension  30 reps cause no change       Strength   Strength Assessment Site  Hip;Knee;Ankle    Right/Left Hip  Right;Left    Right Hip Flexion  5/5    Right Hip Extension  5/5    Right Hip ABduction  5/5    Left Hip Flexion  5/5    Left Hip Extension  3+/5    Left Hip ABduction  4+/5    Right/Left Knee  Right;Left    Right Knee Flexion  4+/5    Right Knee Extension  5/5    Left Knee Flexion  4+/5    Left Knee Extension  5/5    Right/Left Ankle  Right;Left    Right Ankle Dorsiflexion  5/5    Left Ankle Dorsiflexion  5/5      Palpation   Palpation comment  tender to palpation at RT PSISI                 Objective measurements completed on examination: See above findings.      [facility] Adult PT Treatment/Exercise - 08/22/18 0001      Exercises   Exercises  Lumbar      Lumbar Exercises: Supine   Bridge  10 reps    Other Supine Lumbar Exercises  isometric adduction x 10       Manual Therapy   Manual Therapy  Joint mobilization;Muscle Energy Technique    Manual therapy comments  done seperate from all other aspects of treatment     Joint Mobilization  Grade II to Rt SI     Muscle Energy Technique  contract relax  to rotate RT SI anteriorly completed prone              PT Education - 08/22/18 1621    Education Details  HEP     Person(s) Educated  Patient    Methods  Explanation;Handout    Comprehension  Verbalized understanding;Returned demonstration       PT Short Term Goals - 08/22/18 1628      PT SHORT TERM GOAL #1   Title  Pt to be able to complete an hour of housework without back pain going above a 6/10.     Time  2    Period  Weeks    Status  New     Target Date  09/05/18      PT SHORT TERM GOAL #2   Title  PT to be able to carry 10 lb. without increased back pain to be able to carry groceries into the house.     Time  2    Period  Weeks    Status  New      PT SHORT TERM GOAL #3   Title  PT back and hip ROM to improve to allow pt to be able to pick items off the ground with ease.     Time  2    Period  Weeks    Status  New        PT Long Term Goals - 08/22/18 1631      PT LONG TERM GOAL #1   Title  PT to be ableto complete and hour and a half of housework without her pain increasing beyond a 3/10     Time  4    Period  Weeks    Status  New    Target Date  09/19/18      PT LONG TERM GOAL #2   Title  Pt core and LE strength to be strong enough to be able to pick up and hold her grandchild for 10 minutes without increased back pain.     Time  4    Period  Weeks    Status  New      PT LONG TERM GOAL #3   Title  Pt SI to be in correct alignment and pt to be able to verbalize exercises for self mobilization.     Time  4    Period  Weeks    Status  New             Plan - 08/22/18 1623    Clinical Impression Statement  Maria Lang is a 38 yo female who has had chronic low back pain that has been aggrevated for the past two months.  She is currently being referred to skilled PT  to evaluate and treat.  Evaluation demonstrates decreased ROM, decreased strength and  posterior rotation of RT SI jt.  Maria Lang will benefit from skilled PT to address these issues and decrease her pain in order to improve her functional activity tolerance.     Clinical Presentation  Stable    Clinical Decision Making  Moderate    Rehab Potential  Good    PT Frequency  2x / week    PT Duration  4 weeks    PT Treatment/Interventions  ADLs/Self Care Home Management;Therapeutic exercise;Functional mobility training;Therapeutic activities    PT Next Visit Plan  check to see if mobilizing SI jt helped with pain,  assess SI jt , begin  stabilization exercises.  Review evaluation and  goals     PT Home Exercise Plan  bridge, hip adduction isometrically     Consulted and Agree with Plan of Care  Patient       Patient will benefit from skilled therapeutic intervention in order to improve the following deficits and impairments:  Decreased activity tolerance, Decreased range of motion, Decreased strength, Postural dysfunction, Pain  Visit Diagnosis: Acute midline low back pain without sciatica  Muscle weakness (generalized)     Problem List Patient Active Problem List   Diagnosis Date Noted  . Chest pain 12/10/2014  . GERD (gastroesophageal reflux disease) 12/10/2014  . Chronic diastolic CHF (congestive heart failure) ([place]) 12/10/2014  . Hypertension   . Pain in the chest   . Hypertensive emergency     Maria Lang, PT CLT [phone] 08/22/2018, 4:35 PM  [facility] [address] [place], [place], [address] Phone: [phone]   Fax:  [phone]  Name: Maria Lang MRN: [id number] Date of Birth: Apr 16, 1981

## 2018-08-26 ENCOUNTER — Ambulatory Visit
Admission: RE | Admit: 2018-08-26 | Discharge: 2018-08-26 | Disposition: A | Discharge status: Home / Self Care | Payer: BLUE CROSS/BLUE SHIELD | Source: Ambulatory Visit | Attending: Otolaryngology | Admitting: Otolaryngology

## 2018-08-26 DIAGNOSIS — H663X2 Other chronic suppurative otitis media, left ear: Secondary | ICD-10-CM

## 2018-08-26 IMAGING — CT CT TEMPORAL BONES W/O CM
1 series · 14 of 30 positions shown, 18 images · non-contrast
Comparison: Face CT [DATE] and earlier.

CLINICAL DATA: 37-year-old female with Chronic suppurative otitis
media of left ear.

EXAM:
CT TEMPORAL BONES WITHOUT CONTRAST
TECHNIQUE: Axial and coronal plane CT imaging of the petrous temporal bones was
performed with thin-collimation image reconstruction. No intravenous
contrast was administered. Multiplanar CT image reconstructions were
also generated.

[Series 4: soft tissue · axial · 0.41mm/px · z∈[-159,-53]mm · 14 of 61 slices shown, 18 images]
[im 5/61  brain]
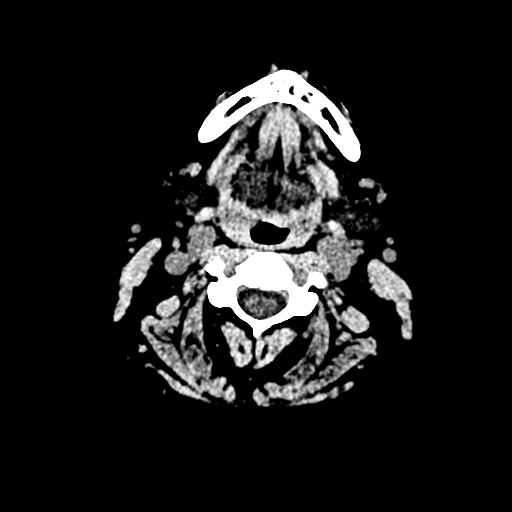
[im 5/61  bone]
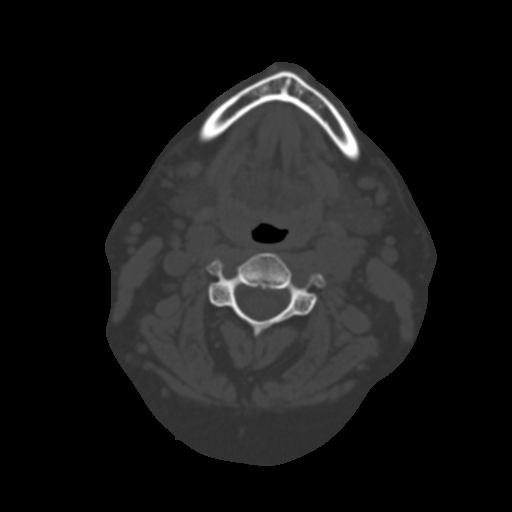
[im 9/61  bone]
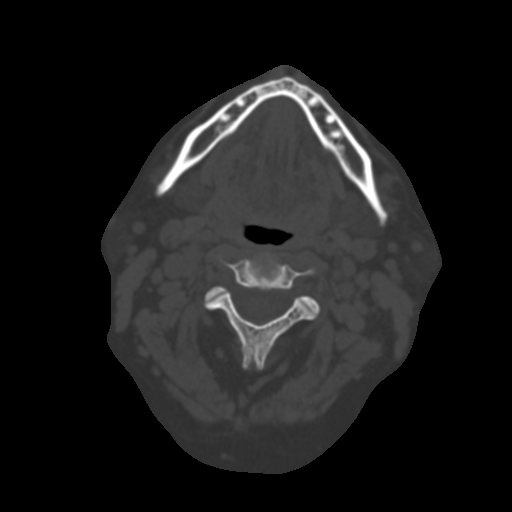
[im 13/61  bone]
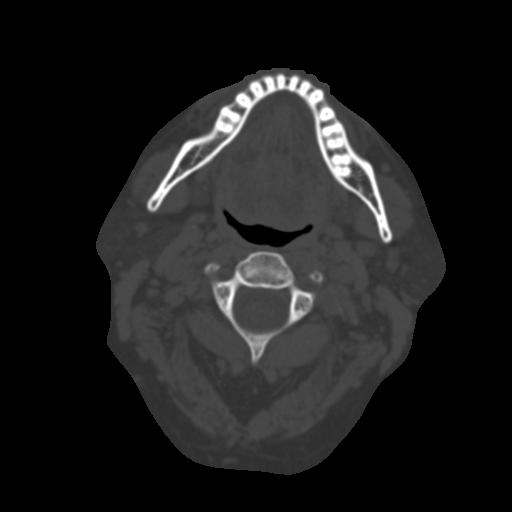
[im 17/61  bone]
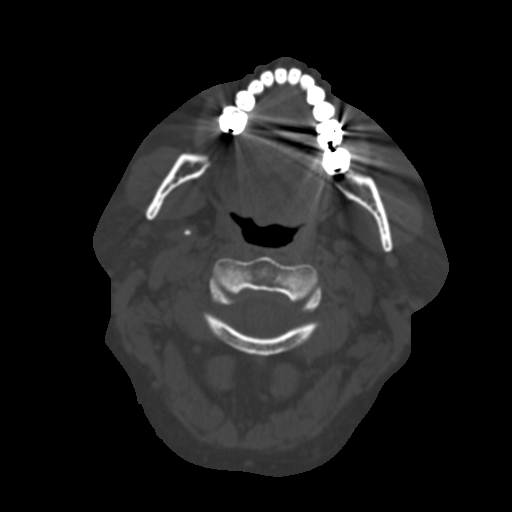
[im 21/61  brain]
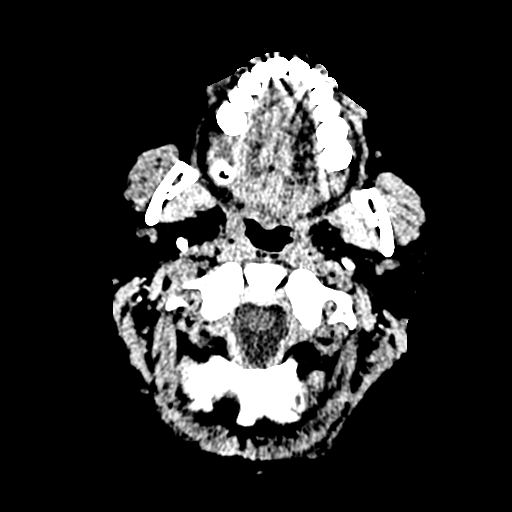
[im 21/61  bone]
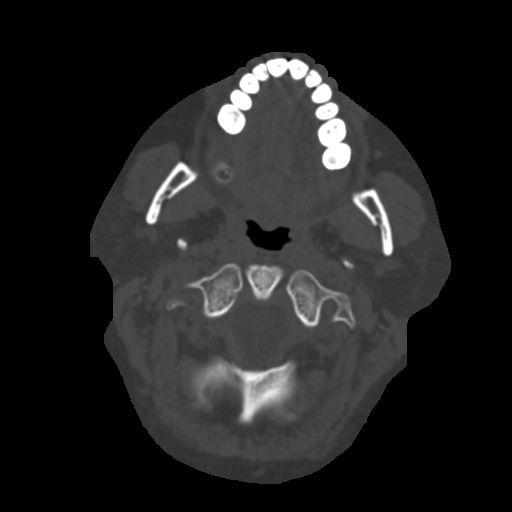
[im 25/61  bone]
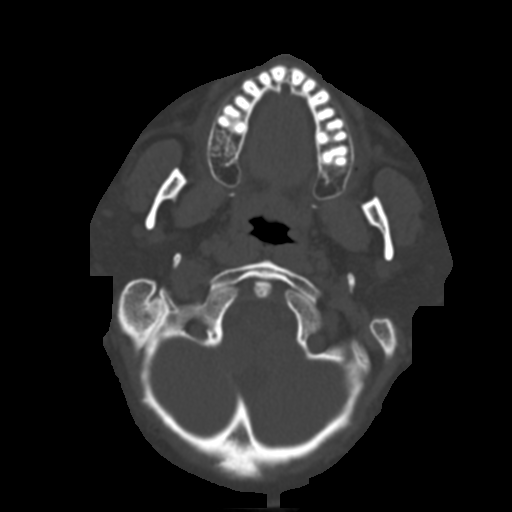
[im 29/61  bone]
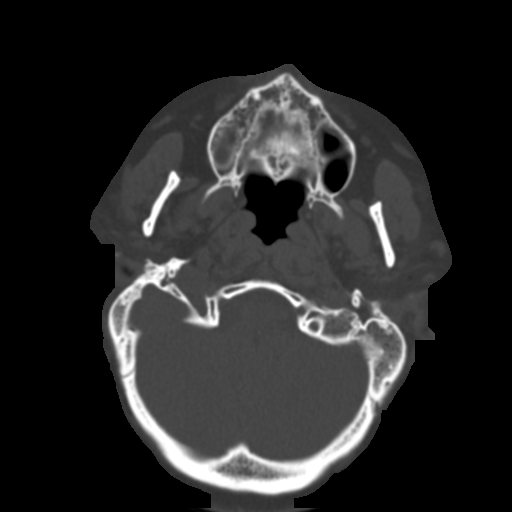
[im 34/61  bone]
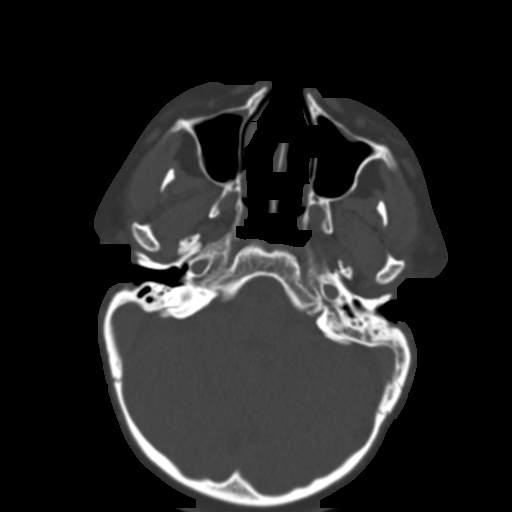
[im 38/61  brain]
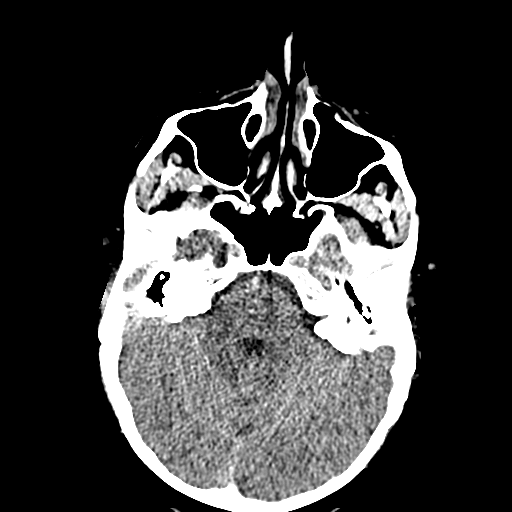
[im 38/61  bone]
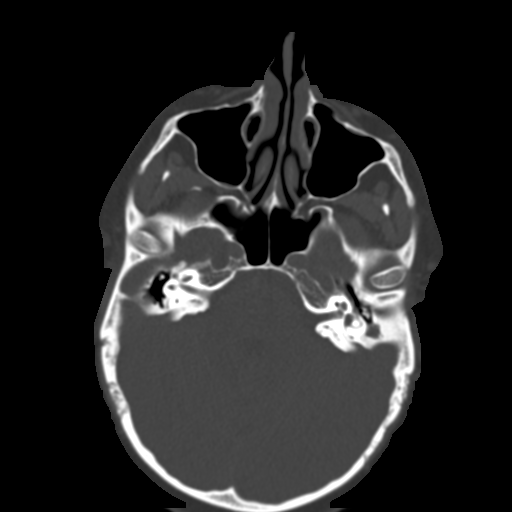
[im 42/61  bone]
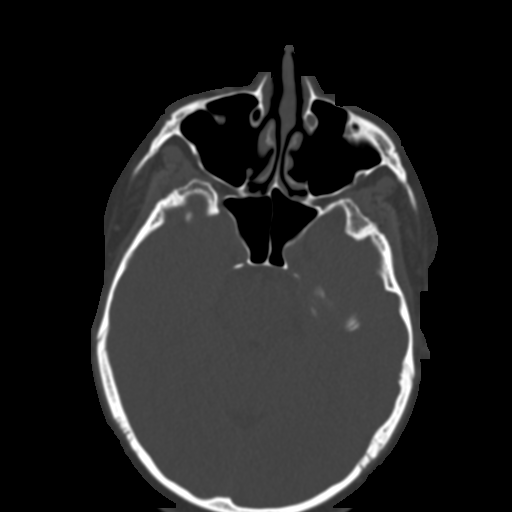
[im 46/61  bone]
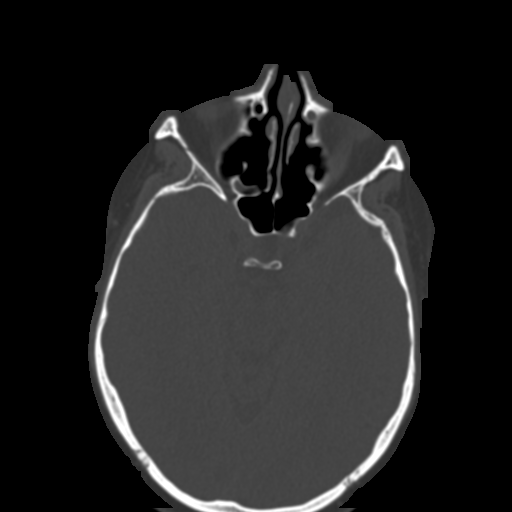
[im 50/61  bone]
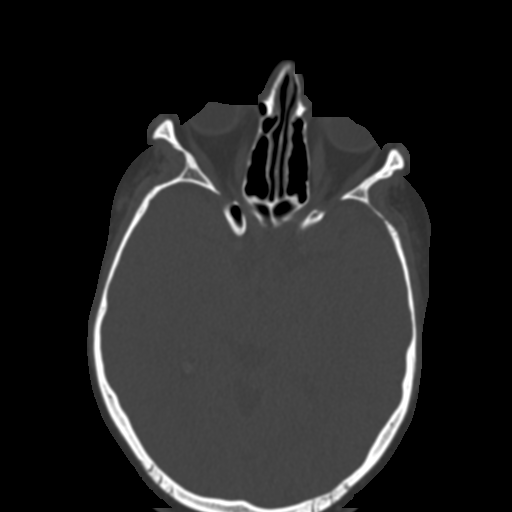
[im 54/61  brain]
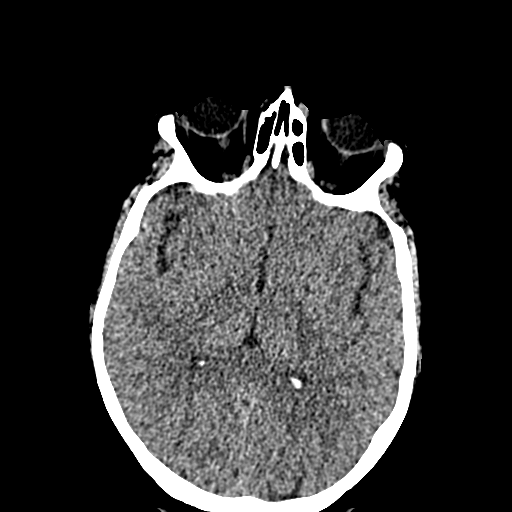
[im 54/61  bone]
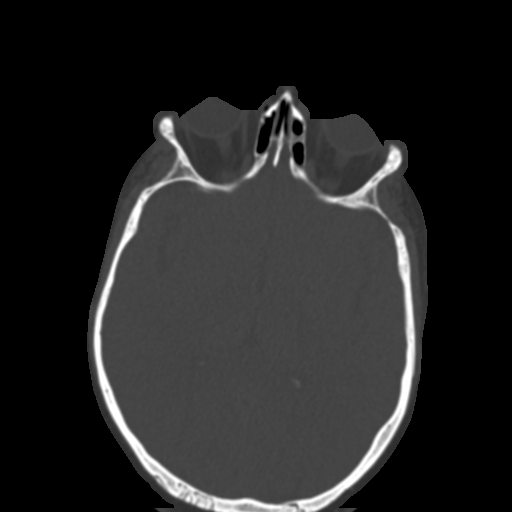
[im 58/61  bone]
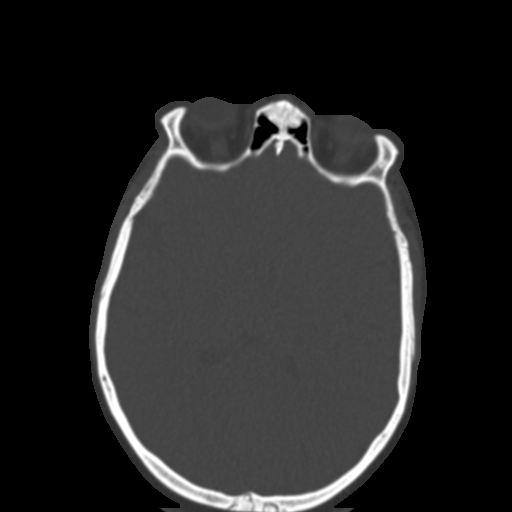

[14 of 30 positions shown; findings below may reference images not displayed]

FINDINGS: Negative visible noncontrast brain parenchyma. Visualized orbits and
scalp soft tissues are within normal limits. Negative visible
noncontrast deep soft tissue spaces of the face.

Prior maxillary antrostomies and ethmoidectomies. Mild paranasal
sinus mucosal thickening, most pronounced in the dependent portion
of the hyperplastic right sphenoid sinus (series 3, image 122).

In general skull base bone mineralization is normal.

RIGHT TEMPORAL BONE:

Negative right EAC. The right tympanic membrane appears normal. The
right tympanic cavity is clear. The ossicles appear intact and
normally aligned. The right scutum is intact. There is thinning of
the right tympanic and to a lesser extent mastoid tegmen (series 9,
image 133). The right mastoid antrum is clear. The right mastoid air
cells are hypoplastic and/or sclerotic.

The right IAC, cochlea, vestibule and vestibular aqueduct are within
normal limits. The right semicircular canals are normal. Negative
course of the right 7th nerve.

LEFT TEMPORAL BONE:

Asymmetric mild soft tissue thickening along the walls of the left
EAC (series 10, image 145). This is contiguous with moderate to
severe thickening of the left tympanic membrane which appears
retracted onto the cochlear promontory (same image).

Associated opacification along the left ossicles most pronounced at
the stapes and manubrium of the malleus. Mild involvement of
Prussak's space. The scutum appears to remain intact (also on image
145). Away from the left TM and ossicles the tympanic cavity remains
pneumatized.

Opacified left mastoid antrum on series 8, image 125 and
sclerotic/opacified other mastoid air cells.

The above findings appear chronic but mildly increased since [IP].

Thinning of the tympanic tegmen similar to the opposite side.

Left IAC, cochlea, vestibule and vestibular aqueduct are within
normal limits.

There is thinning of bone over the left superior semicircular canal
(series 10, image 157). Otherwise normal semicircular canals.

Negative course of the left 7th nerve aside from the tympanic cavity
opacity.
IMPRESSION: 1. Thickening and retraction of the left TM with opacity along the
left ossicles, in the left mastoid antrum, and soft tissue
thickening of the walls of the left EAC. The appearance is chronic
but mildly progressed since [IP].
There is chronic mastoid sclerosis, but no bony erosion identified.
Therefore, favor chronic infection/inflammation rather than
cholesteatoma.
2. Contralateral right mastoid sclerosis, but otherwise the right
temporal bone is normal.

## 2018-08-28 ENCOUNTER — Encounter

## 2018-08-28 ENCOUNTER — Ambulatory Visit (HOSPITAL_COMMUNITY): Payer: BLUE CROSS/BLUE SHIELD | Attending: Physician Assistant

## 2018-08-28 ENCOUNTER — Encounter (HOSPITAL_COMMUNITY): Payer: Self-pay

## 2018-08-28 DIAGNOSIS — M545 Low back pain, unspecified: Secondary | ICD-10-CM

## 2018-08-28 DIAGNOSIS — M6281 Muscle weakness (generalized): Secondary | ICD-10-CM | POA: Diagnosis present

## 2018-08-28 NOTE — Therapy (Signed)
[place], [place], [address] Phone: [phone]   Fax:  [phone]  Physical Therapy Treatment  Patient Details  Name: Maria Lang MRN: [id number] Date of Birth: [birth year] Referring Provider (PT): Kathleen Lime   Encounter Date: 08/28/2018  PT End of Session - 08/28/18 1123    Visit Number  2    Number of Visits  8    Date for PT Re-Evaluation  09/21/18    Authorization Type  BCBS    Authorization - Visit Number  2    Authorization - Number of Visits  8    PT Start Time  6301    PT Stop Time  6010    PT Time Calculation (min)  38 min    Activity Tolerance  Patient tolerated treatment well;No increased pain    Behavior During Therapy  WFL for tasks assessed/performed       Past Medical History:  Diagnosis Date  . Anxiety   . Deviated septum   . GERD (gastroesophageal reflux disease)   . History of bilateral salpingectomy   . Hypertension   . Renal stones   . Sleep apnea    Doesn't use everyday    Past Surgical History:  Procedure Laterality Date  . BILATERAL SALPINGECTOMY  07.2016  . CYSTOSCOPY WITH RETROGRADE PYELOGRAM, URETEROSCOPY AND STENT PLACEMENT Right 07/29/2015   Procedure: CYSTOSCOPY WITH RIGHT RETROGRADE PYELOGRAM, RIGHT URETEROSCOPY AND STENT PLACEMENT;  Surgeon: Irine Seal, MD;  Location: [facility];  Service: Urology;  Laterality: Right;  . ECTOPIC PREGNANCY SURGERY  10/2013  . HOLMIUM LASER APPLICATION Right 02/26/2354   Procedure: HOLMIUM LASER APPLICATION;  Surgeon: Irine Seal, MD;  Location: [facility];  Service: Urology;  Laterality: Right;  . NASAL SEPTUM SURGERY     doen along with tonsillectomy   . TONSILLECTOMY    . TYMPANOSTOMY     x 3    There were no vitals filed for this visit.  Subjective Assessment - 08/28/18 1117    Subjective  Pt stated she has constant burning across lower back, pain scale 5/10 today.      Patient Stated Goals  less pain,     Currently in Pain?  Yes     Pain Score  5     Pain Location  Back    Pain Orientation  Lower    Pain Descriptors / Indicators  Burning    Pain Type  Chronic pain    Pain Onset  More than a month ago    Pain Frequency  Constant    Aggravating Factors   moving heavy things, vaccum and mopping    Pain Relieving Factors  nothing so far                       [facility] Adult PT Treatment/Exercise - 08/28/18 0001      Lumbar Exercises: Supine   Ab Set  10 reps;3 seconds    AB Set Limitations  paired with breathing to reduce holding breath    Bent Knee Raise  10 reps;3 seconds    Bent Knee Raise Limitations  with ab set    Bridge  10 reps    Bridge Limitations  Multiple sets 10 neutral then 5 with Rt foot closer to hips following MET; 10 with adduction squeeze prior    Straight Leg Raise  10 reps    Straight Leg Raises Limitations  Lt only following MET  Other Supine Lumbar Exercises  isometric adduction x 10       Lumbar Exercises: Sidelying   Clam  Both;10 reps;3 seconds    Clam Limitations  cueing for form      Manual Therapy   Manual Therapy  Muscle Energy Technique    Manual therapy comments  Manual complete separate than rest of tx    Muscle Energy Technique  MET for Rt anterior rotation f/b core strengthening              PT Education - 08/28/18 1145    Education Details  Reviewed goals, assured compliance with HEP, Educated on findings with SI alignment and MET techniques    Person(s) Educated  Patient    Methods  Explanation    Comprehension  Verbalized understanding;Returned demonstration       PT Short Term Goals - 08/22/18 1628      PT SHORT TERM GOAL #1   Title  Pt to be able to complete an hour of housework without back pain going above a 6/10.     Time  2    Period  Weeks    Status  New    Target Date  09/05/18      PT SHORT TERM GOAL #2   Title  PT to be able to carry 10 lb. without increased back pain to be able to carry groceries into the house.     Time  2     Period  Weeks    Status  New      PT SHORT TERM GOAL #3   Title  PT back and hip ROM to improve to allow pt to be able to pick items off the ground with ease.     Time  2    Period  Weeks    Status  New        PT Long Term Goals - 08/22/18 1631      PT LONG TERM GOAL #1   Title  PT to be ableto complete and hour and a half of housework without her pain increasing beyond a 3/10     Time  4    Period  Weeks    Status  New    Target Date  09/19/18      PT LONG TERM GOAL #2   Title  Pt core and LE strength to be strong enough to be able to pick up and hold her grandchild for 10 minutes without increased back pain.     Time  4    Period  Weeks    Status  New      PT LONG TERM GOAL #3   Title  Pt SI to be in correct alignment and pt to be able to verbalize exercises for self mobilization.     Time  4    Period  Weeks    Status  New            Plan - 08/28/18 1147    Clinical Impression Statement  Reviewed goals and assured compliance wiht HEP.  Began session assessing SI alignment with noted Rt anterior and Lt posterior rotation wiht leg length deficitis.  MET complete to improve alignment with session focus on core strengthening and lumbar stability exercises.  Pt able to complete all exercises with minimal cueing.  Did imcorporate breathing with abdominal contractions to reduce holding breath.      Rehab Potential  Good    PT Frequency  2x /  week    PT Duration  4 weeks    PT Treatment/Interventions  ADLs/Self Care Home Management;Therapeutic exercise;Functional mobility training;Therapeutic activities    PT Next Visit Plan  check to see if mobilizing SI jt helped with pain,  assess SI jt , Continue with lumbar stabilization exercises.      PT Home Exercise Plan  bridge, hip adduction isometrically        Patient will benefit from skilled therapeutic intervention in order to improve the following deficits and impairments:  Decreased activity tolerance, Decreased  range of motion, Decreased strength, Postural dysfunction, Pain  Visit Diagnosis: Acute midline low back pain without sciatica  Muscle weakness (generalized)     Problem List Patient Active Problem List   Diagnosis Date Noted  . Chest pain 12/10/2014  . GERD (gastroesophageal reflux disease) 12/10/2014  . Chronic diastolic CHF (congestive heart failure) ([place]) 12/10/2014  . Hypertension   . Pain in the chest   . Hypertensive emergency    Ihor Austin, [place]; [place]  Aldona Lento 08/28/2018, 11:59 AM  [place], [place], [address] Phone: [phone]   Fax:  [phone]  Name: Maria Lang MRN: [id number] Date of Birth: 1981-01-15

## 2018-08-30 ENCOUNTER — Ambulatory Visit (HOSPITAL_COMMUNITY): Payer: BLUE CROSS/BLUE SHIELD | Admitting: Physical Therapy

## 2018-08-30 ENCOUNTER — Telehealth (HOSPITAL_COMMUNITY): Payer: Self-pay | Admitting: Physical Therapy

## 2018-08-30 NOTE — Telephone Encounter (Signed)
1ST NO SHOW: No answer left message on machine reminding pt of no show policy and asking if she could call and cancel.  Reminded pt that her next visit is March 10th.   Rayetta Humphrey, [place] CLT [phone]

## 2018-09-03 ENCOUNTER — Encounter (HOSPITAL_COMMUNITY): Payer: Self-pay | Admitting: Physical Therapy

## 2018-09-03 ENCOUNTER — Ambulatory Visit (HOSPITAL_COMMUNITY): Payer: BLUE CROSS/BLUE SHIELD | Admitting: Physical Therapy

## 2018-09-03 DIAGNOSIS — M6281 Muscle weakness (generalized): Secondary | ICD-10-CM

## 2018-09-03 DIAGNOSIS — M545 Low back pain, unspecified: Secondary | ICD-10-CM

## 2018-09-03 NOTE — Patient Instructions (Addendum)
Strengthening: Straight Leg Raise (Phase 1)    Tighten muscles on front of right thigh, then lift leg _18___ inches from surface, keeping knee locked.  Repeat __10__ times per set. Do ___1_ sets per session. Do 1____ sessions per day.  http://orth.exer.us/614   Copyright  VHI. All rights reserved.  Strengthening: Hip Extension (Prone)    Tighten muscles on front of left thigh, then lift leg __3__ inches from surface, keeping knee locked. Repeat __10__ times per set. Do 1____ sets per session. Do ___2_ sessions per day.  http://orth.exer.us/620   Copyright  VHI. All rights reserved.

## 2018-09-03 NOTE — Therapy (Signed)
[place], [place], [address] Phone: [phone]   Fax:  [phone]  Physical Therapy Treatment  Patient Details  Name: Maria Lang MRN: [id number] Date of Birth: 01-14-1981 Referring Provider (PT): Kathleen Lime   Encounter Date: 09/03/2018  PT End of Session - 09/03/18 0929    Visit Number  3    Number of Visits  8    Date for PT Re-Evaluation  09/21/18    Authorization Type  [employer]    Authorization - Visit Number  3    Authorization - Number of Visits  30    PT Start Time  0350    PT Stop Time  0938    PT Time Calculation (min)  43 min    Activity Tolerance  Patient tolerated treatment well;No increased pain    Behavior During Therapy  WFL for tasks assessed/performed       Past Medical History:  Diagnosis Date  . Anxiety   . Deviated septum   . GERD (gastroesophageal reflux disease)   . History of bilateral salpingectomy   . Hypertension   . Renal stones   . Sleep apnea    Doesn't use everyday    Past Surgical History:  Procedure Laterality Date  . BILATERAL SALPINGECTOMY  07.2016  . CYSTOSCOPY WITH RETROGRADE PYELOGRAM, URETEROSCOPY AND STENT PLACEMENT Right 07/29/2015   Procedure: CYSTOSCOPY WITH RIGHT RETROGRADE PYELOGRAM, RIGHT URETEROSCOPY AND STENT PLACEMENT;  Surgeon: Irine Seal, MD;  Location: [facility];  Service: Urology;  Laterality: Right;  . ECTOPIC PREGNANCY SURGERY  10/2013  . HOLMIUM LASER APPLICATION Right 0/02/3817   Procedure: HOLMIUM LASER APPLICATION;  Surgeon: Irine Seal, MD;  Location: [facility];  Service: Urology;  Laterality: Right;  . NASAL SEPTUM SURGERY     doen along with tonsillectomy   . TONSILLECTOMY    . TYMPANOSTOMY     x 3    There were no vitals filed for this visit.  Subjective Assessment - 09/03/18 0855    Subjective  PT states that her back is flared up this morning.  She is having throbbing pain     Limitations  Sitting    How long can you sit comfortably?   aggrevates after a couple of hours.      How long can you stand comfortably?  able to stand for 15-20 minutes and then the pain increases but she will continue with the task.    How long can you walk comfortably?  PT does not walk exercise wise.  She does not notice increase back pain with walking     Patient Stated Goals  less pain,     Currently in Pain?  Yes    Pain Score  8     Pain Location  Back    Pain Orientation  Lower    Pain Descriptors / Indicators  Shooting    Pain Type  Chronic pain    Pain Onset  More than a month ago    Pain Frequency  Constant    Aggravating Factors   steps     Pain Relieving Factors  not sure     Effect of Pain on Daily Activities  limits                        [facility] Adult PT Treatment/Exercise - 09/03/18 0001      Ambulation/Gait   Ambulation Distance (Feet)  452 Feet    Assistive  device  None      Posture/Postural Control   Posture Comments  RT ASIS high/ PSIS LOW       Exercises   Exercises  Lumbar      Lumbar Exercises: Standing   Heel Raises  10 reps    Functional Squats  10 reps      Lumbar Exercises: Supine   Bent Knee Raise  10 reps;3 seconds    Bridge  10 reps    Bridge Limitations  Multiple sets 10 neutral then 5 with Rt foot closer to hips following MET; 10 with adduction squeeze prior    Straight Leg Raise  10 reps    Straight Leg Raises Limitations  RT only    Other Supine Lumbar Exercises  isometric /ab/adduction x 5 multiple reps       Lumbar Exercises: Sidelying   Clam  Both;10 reps;3 seconds    Hip Abduction  10 reps      Lumbar Exercises: Prone   Straight Leg Raise  10 reps    Straight Leg Raises Limitations  Lt only       Manual Therapy   Manual Therapy  Muscle Energy Technique    Manual therapy comments  Manual complete separate than rest of tx    Muscle Energy Technique  For RT anterior rotation                PT Short Term Goals - 09/03/18 0940      PT SHORT TERM GOAL #1   Title   Pt to be able to complete an hour of housework without back pain going above a 6/10.     Time  2    Period  Weeks    Status  On-going    [employer] Date  09/05/18      PT SHORT TERM GOAL #2   Title  PT to be able to carry 10 lb. without increased back pain to be able to carry groceries into the house.     Time  2    Period  Weeks    Status  On-going      PT SHORT TERM GOAL #3   Title  PT back and hip ROM to improve to allow pt to be able to pick items off the ground with ease.     Time  2    Period  Weeks    Status  On-going        PT Long Term Goals - 09/03/18 0941      PT LONG TERM GOAL #1   Title  PT to be ableto complete and hour and a half of housework without her pain increasing beyond a 3/10     Time  4    Period  Weeks    Status  On-going      PT LONG TERM GOAL #2   Title  Pt core and LE strength to be strong enough to be able to pick up and hold her grandchild for 10 minutes without increased back pain.     Time  4    Period  Weeks    Status  On-going      PT LONG TERM GOAL #3   Title  Pt SI to be in correct alignment and pt to be able to verbalize exercises for self mobilization.     Time  4    Period  Weeks    Status  On-going  Plan - 09/03/18 3338    Clinical Impression Statement  SI checked with noted anterior rotation of Right SI which needed posterior muscle energy; Improved but not totally corrected therefore completed Anterior mm energy techniques to Lt LE with good results.  SI stayed in place throughout all exercises.      Rehab Potential  Good    PT Frequency  2x / week    PT Duration  4 weeks    PT Treatment/Interventions  ADLs/Self Care Home Management;Therapeutic exercise;Functional mobility training;Therapeutic activities    PT Next Visit Plan  Assess SI; progress with stabilization exercises.     PT Home Exercise Plan  bridge, hip adduction isometrically        Patient will benefit from skilled therapeutic intervention in  order to improve the following deficits and impairments:  Decreased activity tolerance, Decreased range of motion, Decreased strength, Postural dysfunction, Pain  Visit Diagnosis: Acute midline low back pain without sciatica  Muscle weakness (generalized)     Problem List Patient Active Problem List   Diagnosis Date Noted  . Chest pain 12/10/2014  . GERD (gastroesophageal reflux disease) 12/10/2014  . Chronic diastolic CHF (congestive heart failure) ([place]) 12/10/2014  . Hypertension   . Pain in the chest   . Hypertensive emergency     Rayetta Humphrey, PT CLT [phone] 09/03/2018, 9:42 AM  [place], [place], [address] Phone: [phone]   Fax:  [phone]  Name: Cameron Katayama MRN: [id number] Date of Birth: Jun 08, 1981

## 2018-09-05 ENCOUNTER — Ambulatory Visit (HOSPITAL_COMMUNITY): Payer: BLUE CROSS/BLUE SHIELD | Admitting: Physical Therapy

## 2018-09-05 ENCOUNTER — Encounter (HOSPITAL_COMMUNITY): Payer: Self-pay | Admitting: Physical Therapy

## 2018-09-05 ENCOUNTER — Other Ambulatory Visit: Payer: Self-pay

## 2018-09-05 DIAGNOSIS — M6281 Muscle weakness (generalized): Secondary | ICD-10-CM

## 2018-09-05 DIAGNOSIS — M545 Low back pain, unspecified: Secondary | ICD-10-CM

## 2018-09-05 NOTE — Therapy (Signed)
[place], [place], [address] Phone: [phone]   Fax:  [phone]  Physical Therapy Treatment  Patient Details  Name: Maria Lang MRN: [id number] Date of Birth: 22-Jan-1981 Referring Provider (PT): Kathleen Lime   Encounter Date: 09/05/2018  PT End of Session - 09/05/18 1207    Visit Number  4    Number of Visits  8    Date for PT Re-Evaluation  09/21/18    Authorization Type  [employer]    Authorization - Visit Number  4    Authorization - Number of Visits  30    PT Start Time  0905    PT Stop Time  0945    PT Time Calculation (min)  40 min    Activity Tolerance  Patient tolerated treatment well;No increased pain    Behavior During Therapy  WFL for tasks assessed/performed       Past Medical History:  Diagnosis Date  . Anxiety   . Deviated septum   . GERD (gastroesophageal reflux disease)   . History of bilateral salpingectomy   . Hypertension   . Renal stones   . Sleep apnea    Doesn't use everyday    Past Surgical History:  Procedure Laterality Date  . BILATERAL SALPINGECTOMY  07.2016  . CYSTOSCOPY WITH RETROGRADE PYELOGRAM, URETEROSCOPY AND STENT PLACEMENT Right 07/29/2015   Procedure: CYSTOSCOPY WITH RIGHT RETROGRADE PYELOGRAM, RIGHT URETEROSCOPY AND STENT PLACEMENT;  Surgeon: Irine Seal, MD;  Location: [facility];  Service: Urology;  Laterality: Right;  . ECTOPIC PREGNANCY SURGERY  10/2013  . HOLMIUM LASER APPLICATION Right 5/0/3888   Procedure: HOLMIUM LASER APPLICATION;  Surgeon: Irine Seal, MD;  Location: [facility];  Service: Urology;  Laterality: Right;  . NASAL SEPTUM SURGERY     doen along with tonsillectomy   . TONSILLECTOMY    . TYMPANOSTOMY     x 3    There were no vitals filed for this visit.       Subjective:  Pt states that she is feeling better her pain is only a 2/10.  She has been completing the HEP.  O:  10  each: bridges, isometric hip ab/adduction, Dead bug , supine clam with  red t-band, side-lying abduction, Prone opposite arm leg raise, and sit to stand.  3-D hip excursion x 3 for mobility. Manual not needed today.  Reviewed proper body mechanics to decrease stress off of lumbar area for bed mobility.  A:  Pt needed multiple cuing to achieve proper stabilization with mat exercises.  Pt continues to pull in lumbar mm with sit to stand.  P:  Need additional education on sit to stand.  Begin postural exercises with tband and mad cat old horse stretch.            PT Short Term Goals - 09/03/18 0940      PT SHORT TERM GOAL #1   Title  Pt to be able to complete an hour of housework without back pain going above a 6/10.     Time  2    Period  Weeks    Status  On-going    [employer] Date  09/05/18      PT SHORT TERM GOAL #2   Title  PT to be able to carry 10 lb. without increased back pain to be able to carry groceries into the house.     Time  2    Period  Weeks    Status  On-going      PT SHORT TERM GOAL #3   Title  PT back and hip ROM to improve to allow pt to be able to pick items off the ground with ease.     Time  2    Period  Weeks    Status  On-going        PT Long Term Goals - 09/03/18 0941      PT LONG TERM GOAL #1   Title  PT to be ableto complete and hour and a half of housework without her pain increasing beyond a 3/10     Time  4    Period  Weeks    Status  On-going      PT LONG TERM GOAL #2   Title  Pt core and LE strength to be strong enough to be able to pick up and hold her grandchild for 10 minutes without increased back pain.     Time  4    Period  Weeks    Status  On-going      PT LONG TERM GOAL #3   Title  Pt SI to be in correct alignment and pt to be able to verbalize exercises for self mobilization.     Time  4    Period  Weeks    Status  On-going              Patient will benefit from skilled therapeutic intervention in order to improve the following deficits and impairments:     Visit Diagnosis: Acute  midline low back pain without sciatica  Muscle weakness (generalized)     Problem List Patient Active Problem List   Diagnosis Date Noted  . Chest pain 12/10/2014  . GERD (gastroesophageal reflux disease) 12/10/2014  . Chronic diastolic CHF (congestive heart failure) ([place]) 12/10/2014  . Hypertension   . Pain in the chest   . Hypertensive emergency    Rayetta Humphrey, PT CLT [phone] 09/05/2018, 12:08 PM  [place], [place], [address] Phone: [phone]   Fax:  [phone]  Name: Maria Lang MRN: [id number] Date of Birth: 03/19/1981

## 2018-09-10 ENCOUNTER — Telehealth (HOSPITAL_COMMUNITY): Payer: Self-pay | Admitting: Family Medicine

## 2018-09-10 ENCOUNTER — Ambulatory Visit (HOSPITAL_COMMUNITY): Payer: BLUE CROSS/BLUE SHIELD | Admitting: Physical Therapy

## 2018-09-10 NOTE — Telephone Encounter (Signed)
09/10/18  pt called to cx said she woke up not feeling well

## 2018-09-12 ENCOUNTER — Telehealth (HOSPITAL_COMMUNITY): Payer: Self-pay | Admitting: Family Medicine

## 2018-09-12 ENCOUNTER — Ambulatory Visit (HOSPITAL_COMMUNITY): Payer: BLUE CROSS/BLUE SHIELD | Admitting: Physical Therapy

## 2018-09-12 NOTE — Telephone Encounter (Signed)
09/12/18  son called to say that patient will not be able to make her appt today

## 2018-09-13 ENCOUNTER — Telehealth (HOSPITAL_COMMUNITY): Payer: Self-pay

## 2018-09-13 NOTE — Telephone Encounter (Signed)
I spoke with Ms. Philbert Riser and informed her our office is closing for 2 weeks due to the threat of [place]. I offered to update her HEP and sh declined and she requested that we call her after we officially re-open to reschedule her visits. She is not interested in telehealth or a weekly check in.  Kipp Brood, PT, DPT, [facility] Physical Therapist with [facility]  09/13/2018 2:57 PM

## 2018-09-17 ENCOUNTER — Ambulatory Visit (HOSPITAL_COMMUNITY): Payer: BLUE CROSS/BLUE SHIELD | Admitting: Physical Therapy

## 2018-09-19 ENCOUNTER — Ambulatory Visit (HOSPITAL_COMMUNITY): Payer: BLUE CROSS/BLUE SHIELD | Admitting: Physical Therapy

## 2018-11-03 ENCOUNTER — Other Ambulatory Visit: Payer: Self-pay | Admitting: Gastroenterology

## 2018-11-03 DIAGNOSIS — K2950 Unspecified chronic gastritis without bleeding: Secondary | ICD-10-CM

## 2018-11-05 ENCOUNTER — Telehealth (HOSPITAL_COMMUNITY): Payer: Self-pay | Admitting: Physical Therapy

## 2018-11-05 NOTE — Telephone Encounter (Signed)
Pt was contacted today by phone regarding resuming therapy services following our temporary reduction of Services secondary to COVID-19.  Patient identity was verified Pt states she is doing well at this time with the exercises, mm relaxers and massage therapy.  States she would like to be discharged.    Maria Lang, PTA/CLT [phone]

## 2018-12-02 ENCOUNTER — Other Ambulatory Visit: Payer: Self-pay

## 2018-12-02 ENCOUNTER — Emergency Department (HOSPITAL_COMMUNITY)
Admission: EM | Admit: 2018-12-02 | Discharge: 2018-12-02 | Discharge status: Against Medical Advice | Payer: BC Managed Care – PPO

## 2018-12-21 ENCOUNTER — Inpatient Hospital Stay (HOSPITAL_COMMUNITY)
Admission: EM | Admit: 2018-12-21 | Discharge: 2018-12-23 | DRG: 065 | Disposition: A | Discharge status: Home / Self Care | Payer: BC Managed Care – PPO | Attending: Internal Medicine | Admitting: Internal Medicine

## 2018-12-21 ENCOUNTER — Emergency Department (HOSPITAL_COMMUNITY): Payer: BC Managed Care – PPO

## 2018-12-21 ENCOUNTER — Encounter (HOSPITAL_COMMUNITY): Payer: Self-pay | Admitting: Family Medicine

## 2018-12-21 ENCOUNTER — Other Ambulatory Visit: Payer: Self-pay

## 2018-12-21 DIAGNOSIS — E039 Hypothyroidism, unspecified: Secondary | ICD-10-CM | POA: Diagnosis present

## 2018-12-21 DIAGNOSIS — Z7951 Long term (current) use of inhaled steroids: Secondary | ICD-10-CM

## 2018-12-21 DIAGNOSIS — G4733 Obstructive sleep apnea (adult) (pediatric): Secondary | ICD-10-CM | POA: Diagnosis not present

## 2018-12-21 DIAGNOSIS — G8194 Hemiplegia, unspecified affecting left nondominant side: Secondary | ICD-10-CM | POA: Diagnosis present

## 2018-12-21 DIAGNOSIS — Z6829 Body mass index (BMI) 29.0-29.9, adult: Secondary | ICD-10-CM

## 2018-12-21 DIAGNOSIS — G4730 Sleep apnea, unspecified: Secondary | ICD-10-CM | POA: Diagnosis present

## 2018-12-21 DIAGNOSIS — Z79899 Other long term (current) drug therapy: Secondary | ICD-10-CM

## 2018-12-21 DIAGNOSIS — R531 Weakness: Secondary | ICD-10-CM

## 2018-12-21 DIAGNOSIS — Z79890 Hormone replacement therapy (postmenopausal): Secondary | ICD-10-CM

## 2018-12-21 DIAGNOSIS — F17200 Nicotine dependence, unspecified, uncomplicated: Secondary | ICD-10-CM | POA: Diagnosis present

## 2018-12-21 DIAGNOSIS — R739 Hyperglycemia, unspecified: Secondary | ICD-10-CM | POA: Diagnosis not present

## 2018-12-21 DIAGNOSIS — I110 Hypertensive heart disease with heart failure: Secondary | ICD-10-CM | POA: Diagnosis present

## 2018-12-21 DIAGNOSIS — F1320 Sedative, hypnotic or anxiolytic dependence, uncomplicated: Secondary | ICD-10-CM | POA: Diagnosis present

## 2018-12-21 DIAGNOSIS — E669 Obesity, unspecified: Secondary | ICD-10-CM | POA: Diagnosis present

## 2018-12-21 DIAGNOSIS — I639 Cerebral infarction, unspecified: Secondary | ICD-10-CM | POA: Diagnosis present

## 2018-12-21 DIAGNOSIS — I10 Essential (primary) hypertension: Secondary | ICD-10-CM | POA: Diagnosis not present

## 2018-12-21 DIAGNOSIS — R4701 Aphasia: Secondary | ICD-10-CM | POA: Diagnosis present

## 2018-12-21 DIAGNOSIS — I169 Hypertensive crisis, unspecified: Secondary | ICD-10-CM

## 2018-12-21 DIAGNOSIS — Z1159 Encounter for screening for other viral diseases: Secondary | ICD-10-CM | POA: Diagnosis not present

## 2018-12-21 DIAGNOSIS — F419 Anxiety disorder, unspecified: Secondary | ICD-10-CM | POA: Diagnosis present

## 2018-12-21 DIAGNOSIS — K219 Gastro-esophageal reflux disease without esophagitis: Secondary | ICD-10-CM

## 2018-12-21 DIAGNOSIS — F329 Major depressive disorder, single episode, unspecified: Secondary | ICD-10-CM | POA: Diagnosis present

## 2018-12-21 DIAGNOSIS — F411 Generalized anxiety disorder: Secondary | ICD-10-CM | POA: Diagnosis present

## 2018-12-21 DIAGNOSIS — F17210 Nicotine dependence, cigarettes, uncomplicated: Secondary | ICD-10-CM | POA: Diagnosis present

## 2018-12-21 DIAGNOSIS — E876 Hypokalemia: Secondary | ICD-10-CM | POA: Diagnosis present

## 2018-12-21 DIAGNOSIS — I6389 Other cerebral infarction: Principal | ICD-10-CM | POA: Diagnosis present

## 2018-12-21 DIAGNOSIS — E785 Hyperlipidemia, unspecified: Secondary | ICD-10-CM | POA: Diagnosis present

## 2018-12-21 DIAGNOSIS — R29810 Facial weakness: Secondary | ICD-10-CM | POA: Diagnosis present

## 2018-12-21 DIAGNOSIS — I5032 Chronic diastolic (congestive) heart failure: Secondary | ICD-10-CM | POA: Diagnosis not present

## 2018-12-21 DIAGNOSIS — I161 Hypertensive emergency: Secondary | ICD-10-CM | POA: Diagnosis present

## 2018-12-21 DIAGNOSIS — R29711 NIHSS score 11: Secondary | ICD-10-CM | POA: Diagnosis present

## 2018-12-21 DIAGNOSIS — R479 Unspecified speech disturbances: Secondary | ICD-10-CM | POA: Diagnosis present

## 2018-12-21 LAB — PROTIME-INR
INR: 1 (ref 0.8–1.2)
Prothrombin Time: 13.1 seconds (ref 11.4–15.2)

## 2018-12-21 LAB — HEMOGLOBIN A1C
Hgb A1c MFr Bld: 5.4 % (ref 4.8–5.6)
Mean Plasma Glucose: 108.28 mg/dL

## 2018-12-21 LAB — URINALYSIS, ROUTINE W REFLEX MICROSCOPIC
Bilirubin Urine: NEGATIVE
Glucose, UA: NEGATIVE mg/dL
Hgb urine dipstick: NEGATIVE
Ketones, ur: NEGATIVE mg/dL
Nitrite: NEGATIVE
Protein, ur: NEGATIVE mg/dL
Specific Gravity, Urine: 1.029 (ref 1.005–1.030)
Squamous Epithelial / HPF: >50 — ABNORMAL HIGH (ref 0–5)
pH: 7 (ref 5.0–8.0)

## 2018-12-21 LAB — CBC
HCT: 38.5 % (ref 36.0–46.0)
HCT: 37.9 % (ref 36.0–46.0)
Hemoglobin: 11.8 g/dL — ABNORMAL LOW (ref 12.0–15.0)
Hemoglobin: 12 g/dL (ref 12.0–15.0)
MCH: 27.1 pg (ref 26.0–34.0)
MCH: 26.8 pg (ref 26.0–34.0)
MCHC: 30.6 g/dL (ref 30.0–36.0)
MCHC: 31.7 g/dL (ref 30.0–36.0)
MCV: 88.3 fL (ref 80.0–100.0)
MCV: 84.8 fL (ref 80.0–100.0)
Platelets: 305 10*3/uL (ref 150–400)
Platelets: 335 10*3/uL (ref 150–400)
RBC: 4.36 MIL/uL (ref 3.87–5.11)
RBC: 4.47 MIL/uL (ref 3.87–5.11)
RDW: 14.6 % (ref 11.5–15.5)
RDW: 14.6 % (ref 11.5–15.5)
WBC: 9.7 10*3/uL (ref 4.0–10.5)
WBC: 11.6 10*3/uL — ABNORMAL HIGH (ref 4.0–10.5)
nRBC: 0 % (ref 0.0–0.2)
nRBC: 0 % (ref 0.0–0.2)

## 2018-12-21 LAB — COMPREHENSIVE METABOLIC PANEL
ALT: 15 U/L (ref 0–44)
AST: 13 U/L — ABNORMAL LOW (ref 15–41)
Albumin: 4.6 g/dL (ref 3.5–5.0)
Alkaline Phosphatase: 66 U/L (ref 38–126)
Anion gap: 11 (ref 5–15)
BUN: 14 mg/dL (ref 6–20)
CO2: 25 mmol/L (ref 22–32)
Calcium: 9.8 mg/dL (ref 8.9–10.3)
Chloride: 103 mmol/L (ref 98–111)
Creatinine, Ser: 0.71 mg/dL (ref 0.44–1.00)
GFR calc Af Amer: >60 mL/min (ref >60)
GFR calc non Af Amer: >60 mL/min (ref >60)
Glucose, Bld: 129 mg/dL — ABNORMAL HIGH (ref 70–99)
Potassium: 4 mmol/L (ref 3.5–5.1)
Sodium: 139 mmol/L (ref 135–145)
Total Bilirubin: 0.3 mg/dL (ref 0.3–1.2)
Total Protein: 8.1 g/dL (ref 6.5–8.1)

## 2018-12-21 LAB — DIFFERENTIAL
Abs Immature Granulocytes: 0.05 10*3/uL (ref 0.00–0.07)
Basophils Absolute: 0 10*3/uL (ref 0.0–0.1)
Basophils Relative: 0 %
Eosinophils Absolute: 0 10*3/uL (ref 0.0–0.5)
Eosinophils Relative: 0 %
Immature Granulocytes: 1 %
Lymphocytes Relative: 22 %
Lymphs Abs: 2.1 10*3/uL (ref 0.7–4.0)
Monocytes Absolute: 0.6 10*3/uL (ref 0.1–1.0)
Monocytes Relative: 7 %
Neutro Abs: 6.8 10*3/uL (ref 1.7–7.7)
Neutrophils Relative %: 70 %

## 2018-12-21 LAB — RAPID URINE DRUG SCREEN, HOSP PERFORMED
Amphetamines: NOT DETECTED
Barbiturates: NOT DETECTED
Benzodiazepines: POSITIVE — AB
Cocaine: NOT DETECTED
Opiates: NOT DETECTED
Tetrahydrocannabinol: NOT DETECTED

## 2018-12-21 LAB — LIPID PANEL
Cholesterol: 235 mg/dL — ABNORMAL HIGH (ref 0–200)
HDL: 29 mg/dL — ABNORMAL LOW (ref >40)
LDL Cholesterol: 146 mg/dL — ABNORMAL HIGH (ref 0–99)
Total CHOL/HDL Ratio: 8.1 RATIO
Triglycerides: 298 mg/dL — ABNORMAL HIGH (ref <150)
VLDL: 60 mg/dL — ABNORMAL HIGH (ref 0–40)

## 2018-12-21 LAB — SARS CORONAVIRUS 2 BY RT PCR (HOSPITAL ORDER, PERFORMED IN ~~LOC~~ HOSPITAL LAB): SARS Coronavirus 2: NEGATIVE

## 2018-12-21 LAB — CREATININE, SERUM
Creatinine, Ser: 0.67 mg/dL (ref 0.44–1.00)
GFR calc Af Amer: >60 mL/min (ref >60)
GFR calc non Af Amer: >60 mL/min (ref >60)

## 2018-12-21 LAB — ETHANOL: Alcohol, Ethyl (B): <10 mg/dL (ref <10)

## 2018-12-21 LAB — APTT: aPTT: 29 seconds (ref 24–36)

## 2018-12-21 LAB — POC URINE PREG, ED: Preg Test, Ur: NEGATIVE

## 2018-12-21 IMAGING — CT CT HEAD CODE STROKE W/O CM
3 series · 15 of 47 positions shown, 18 images · non-contrast
Comparison: [DATE]

CLINICAL DATA: Code stroke. Left-sided weakness. Last seen normal
last night

EXAM:
CT HEAD WITHOUT CONTRAST
TECHNIQUE: Contiguous axial images were obtained from the base of the skull
through the vertex without intravenous contrast.

[Series 2: head w o · axial · 0.44mm/px · z∈[+46,+176]mm · 9 of 32 slices shown, 12 images]
[im 3/32  brain]
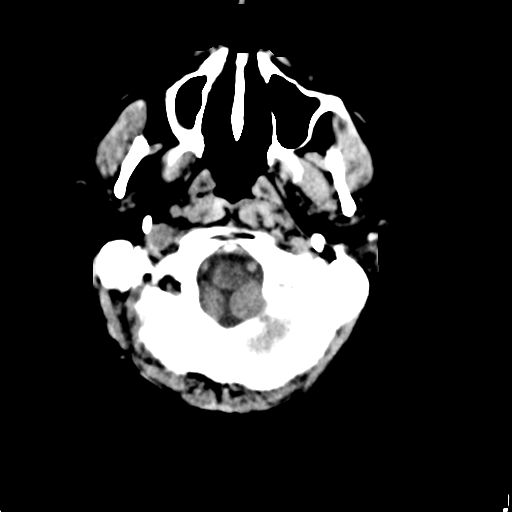
[im 3/32  bone]
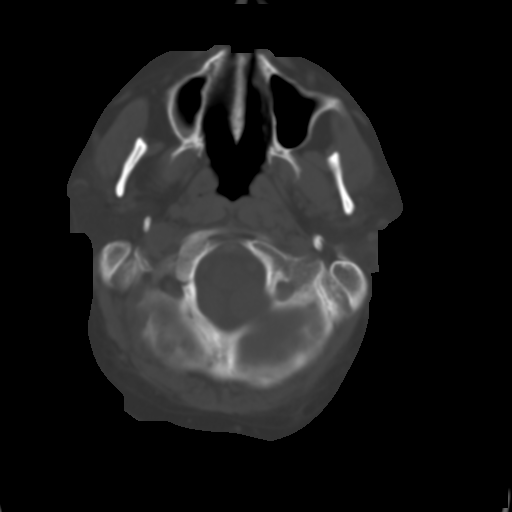
[im 6/32  brain]
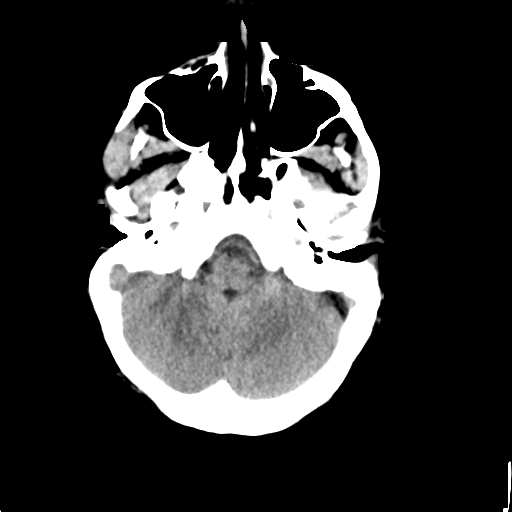
[im 9/32  brain]
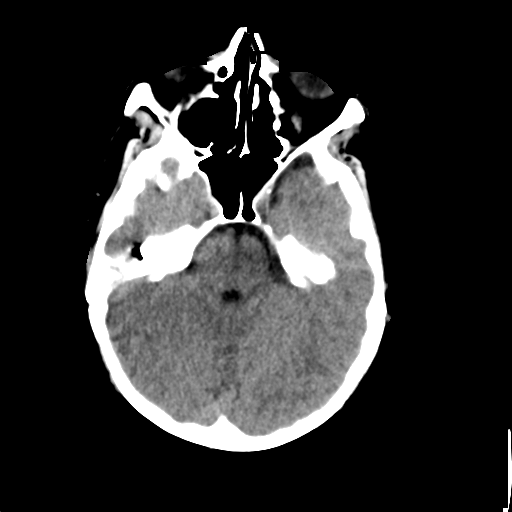
[im 12/32  brain]
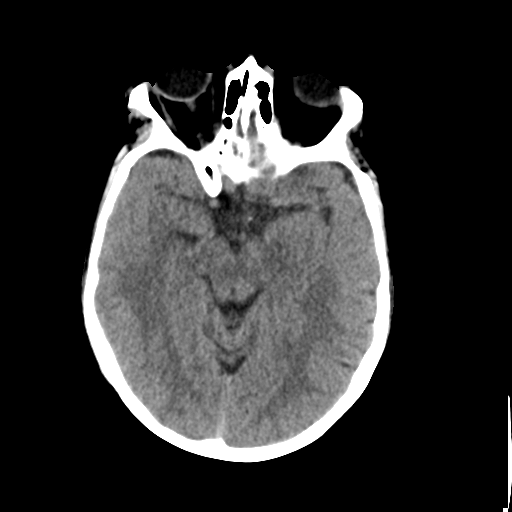
[im 17/32  brain]
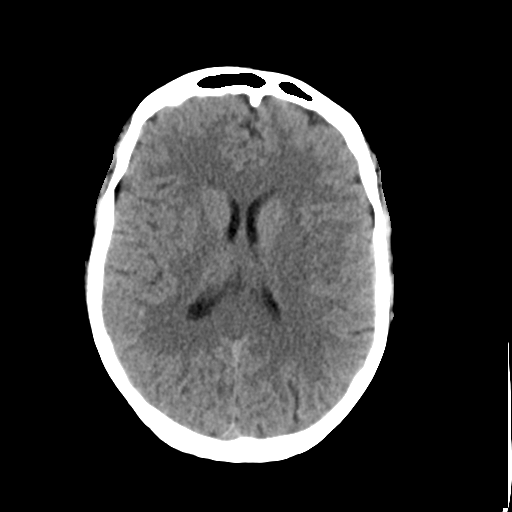
[im 17/32  bone]
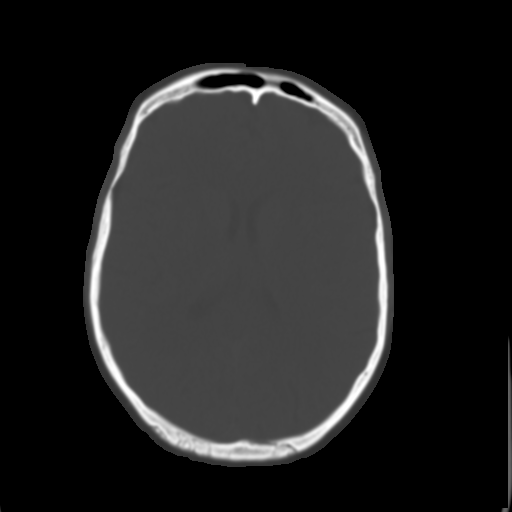
[im 20/32  brain]
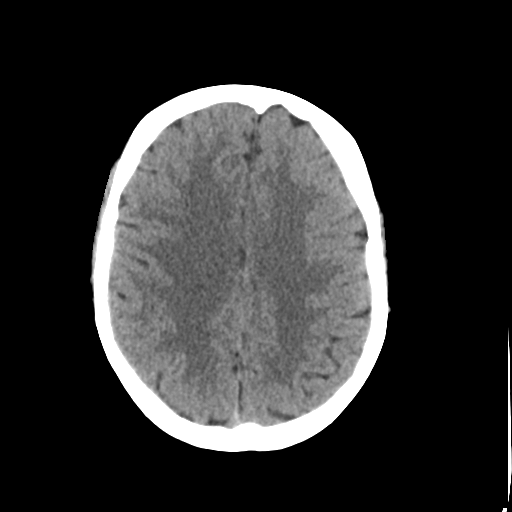
[im 23/32  brain]
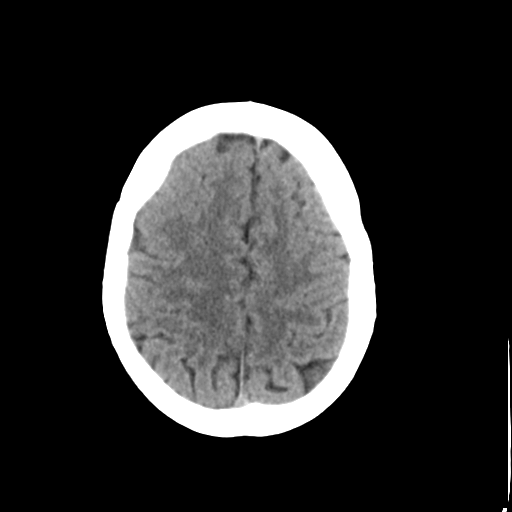
[im 26/32  brain]
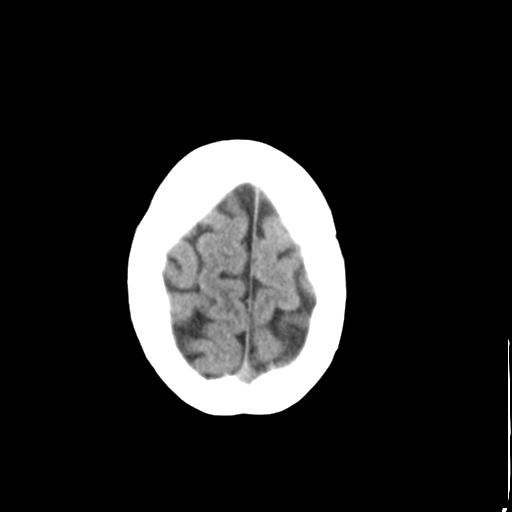
[im 29/32  brain]
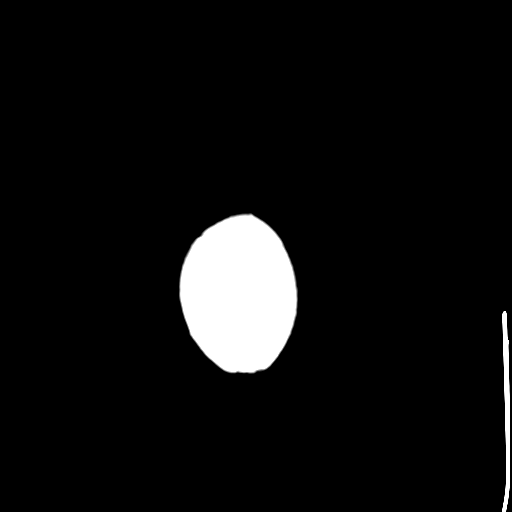
[im 29/32  bone]
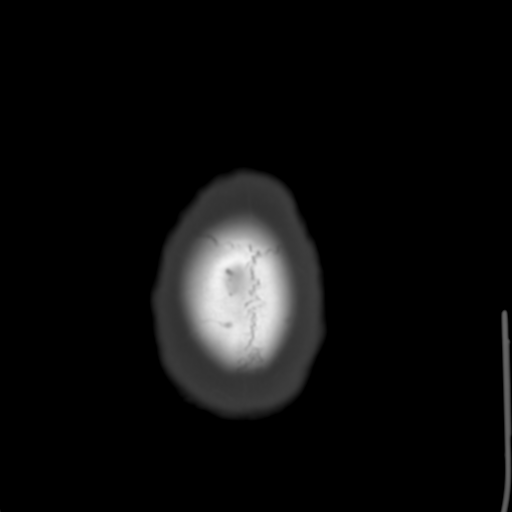

[Series 4: coronal soft · coronal · 0.33mm/px · 3 of 71 slices shown]
[im 24/71  brain]
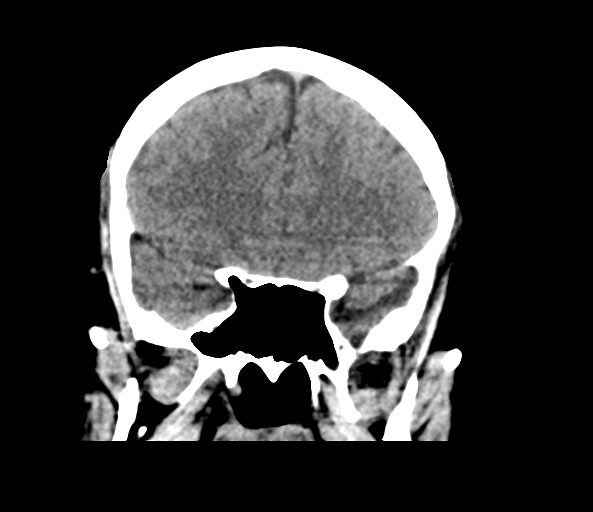
[im 32/71  brain]
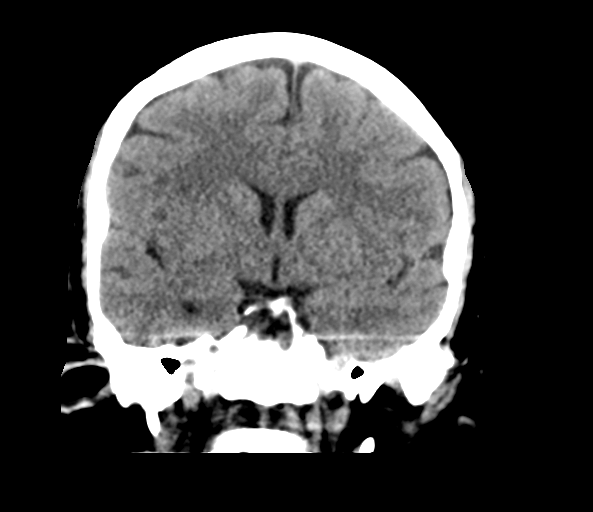
[im 39/71  brain]
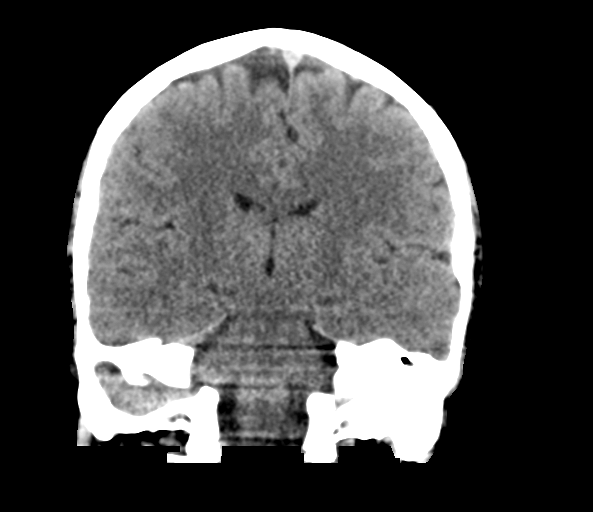

[Series 5: sagittal soft · sagittal · 0.36mm/px · 3 of 67 slices shown]
[im 23/67  brain]
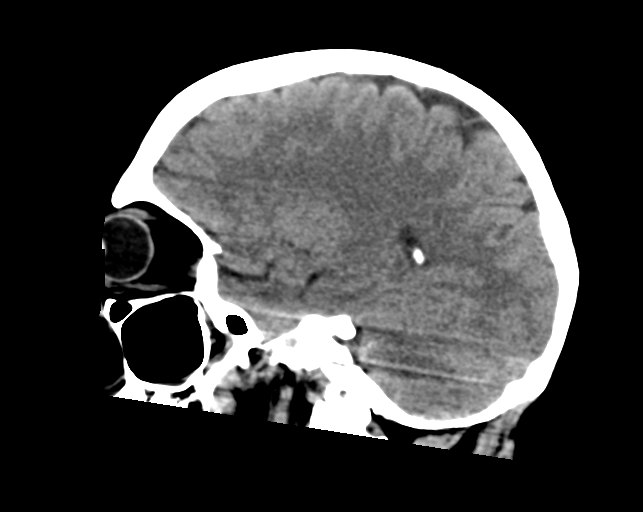
[im 34/67  brain]
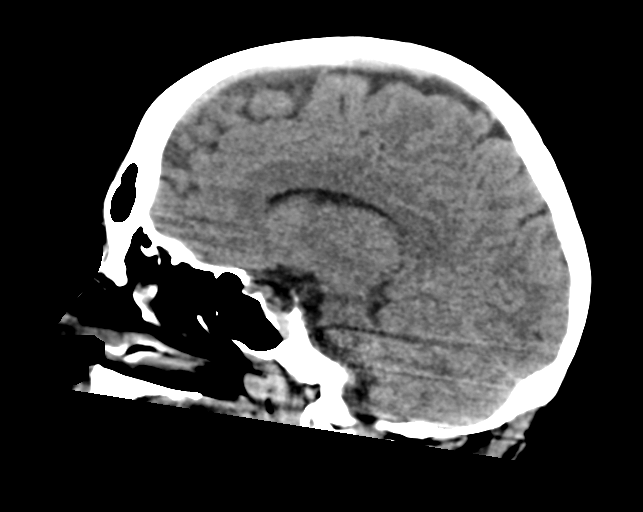
[im 45/67  brain]
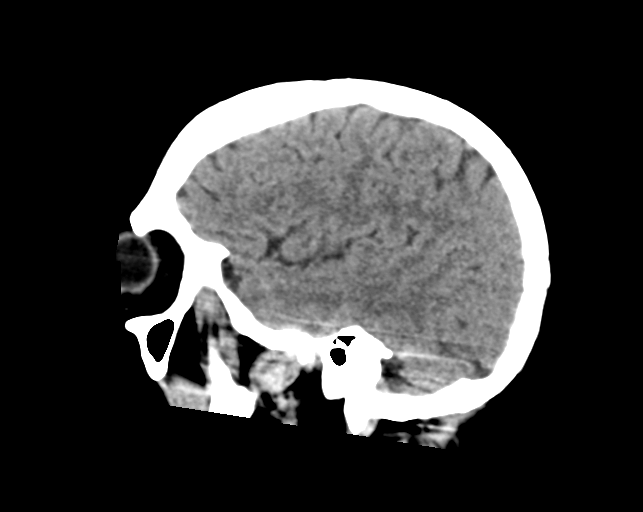

[15 of 47 positions shown; findings below may reference images not displayed]

FINDINGS: Brain: No evidence of acute infarction, hemorrhage, hydrocephalus,
extra-axial collection or mass lesion/mass effect.

Vascular: No hyperdense vessel or unexpected calcification.

Skull: Normal. Negative for fracture or focal lesion.

Sinuses/Orbits: No acute finding.

Other: These results were called by telephone at the time of
interpretation on [DATE] at [DATE] to Dr. RALEIGH , who
verbally acknowledged these results.

ASPECTS ([place] Stroke Program Early CT Score)

- Ganglionic level infarction (caudate, lentiform nuclei, internal
capsule, insula, M1-M3 cortex): 7

- Supraganglionic infarction (M4-M6 cortex): 3

Total score (0-10 with 10 being normal): 10
IMPRESSION: Negative. ASPECTS is 10.

## 2018-12-21 MED ORDER — PANTOPRAZOLE SODIUM 40 MG PO TBEC
40.0000 mg | DELAYED_RELEASE_TABLET | Freq: Two times a day (BID) | ORAL | Status: DC
  Administered 2018-12-21 – 2018-12-23 (×4): 40 mg via ORAL
  Filled 2018-12-21 (×4): qty 1

## 2018-12-21 MED ORDER — ACETAMINOPHEN 325 MG PO TABS
650.0000 mg | ORAL_TABLET | ORAL | Status: DC | PRN
  Administered 2018-12-21 – 2018-12-23 (×5): 650 mg via ORAL
  Filled 2018-12-21 (×5): qty 2

## 2018-12-21 MED ORDER — METOPROLOL TARTRATE 5 MG/5ML IV SOLN: 2.5000 mg | Freq: Four times a day (QID) | INTRAVENOUS | Status: DC | PRN

## 2018-12-21 MED ORDER — ACETAMINOPHEN 160 MG/5ML PO SOLN: 650.0000 mg | ORAL | Status: DC | PRN

## 2018-12-21 MED ORDER — FLUTICASONE PROPIONATE 50 MCG/ACT NA SUSP: 2.0000 | Freq: Every day | NASAL | Status: DC | PRN

## 2018-12-21 MED ORDER — ALPRAZOLAM 0.25 MG PO TABS
0.2500 mg | ORAL_TABLET | Freq: Three times a day (TID) | ORAL | Status: DC | PRN
  Administered 2018-12-21 – 2018-12-22 (×2): 0.25 mg via ORAL
  Filled 2018-12-21 (×2): qty 1

## 2018-12-21 MED ORDER — NICOTINE 21 MG/24HR TD PT24
21.0000 mg | MEDICATED_PATCH | Freq: Every day | TRANSDERMAL | Status: DC
  Administered 2018-12-21 – 2018-12-22 (×2): 21 mg via TRANSDERMAL
  Filled 2018-12-21 (×3): qty 1

## 2018-12-21 MED ORDER — HYDRALAZINE HCL 20 MG/ML IJ SOLN
20.0000 mg | Freq: Once | INTRAMUSCULAR | Status: AC
  Administered 2018-12-21: 20 mg via INTRAVENOUS
  Filled 2018-12-21: qty 1

## 2018-12-21 MED ORDER — STROKE: EARLY STAGES OF RECOVERY BOOK
Freq: Once | Status: AC
  Administered 2018-12-21: 1
  Filled 2018-12-21: qty 1

## 2018-12-21 MED ORDER — ENOXAPARIN SODIUM 40 MG/0.4ML ~~LOC~~ SOLN
40.0000 mg | SUBCUTANEOUS | Status: DC
  Administered 2018-12-21 – 2018-12-22 (×2): 40 mg via SUBCUTANEOUS
  Filled 2018-12-21 (×2): qty 0.4

## 2018-12-21 MED ORDER — SENNOSIDES-DOCUSATE SODIUM 8.6-50 MG PO TABS
1.0000 | ORAL_TABLET | Freq: Every evening | ORAL | Status: DC | PRN
  Administered 2018-12-21: 1 via ORAL
  Filled 2018-12-21: qty 1

## 2018-12-21 MED ORDER — LEVOTHYROXINE SODIUM 50 MCG PO TABS
50.0000 ug | ORAL_TABLET | Freq: Every day | ORAL | Status: DC
  Administered 2018-12-22 – 2018-12-23 (×2): 50 ug via ORAL
  Filled 2018-12-21 (×2): qty 1

## 2018-12-21 MED ORDER — IOHEXOL 350 MG/ML SOLN
115.0000 mL | Freq: Once | INTRAVENOUS | Status: AC | PRN
  Administered 2018-12-21: 115 mL via INTRAVENOUS

## 2018-12-21 MED ORDER — ACETAMINOPHEN 650 MG RE SUPP: 650.0000 mg | RECTAL | Status: DC | PRN

## 2018-12-21 MED ORDER — ASPIRIN 300 MG RE SUPP: 300.0000 mg | Freq: Every day | RECTAL | Status: DC

## 2018-12-21 MED ORDER — SERTRALINE HCL 50 MG PO TABS
150.0000 mg | ORAL_TABLET | Freq: Every day | ORAL | Status: DC
  Administered 2018-12-22 – 2018-12-23 (×2): 150 mg via ORAL
  Filled 2018-12-21 (×2): qty 1

## 2018-12-21 MED ORDER — ASPIRIN 81 MG PO CHEW
324.0000 mg | CHEWABLE_TABLET | Freq: Once | ORAL | Status: AC
  Administered 2018-12-21: 324 mg via ORAL
  Filled 2018-12-21: qty 4

## 2018-12-21 MED ORDER — SODIUM CHLORIDE 0.9 % IV SOLN
INTRAVENOUS | Status: DC
  Administered 2018-12-21: 18:00:00 via INTRAVENOUS

## 2018-12-21 MED ORDER — HYDRALAZINE HCL 20 MG/ML IJ SOLN
10.0000 mg | INTRAMUSCULAR | Status: DC | PRN
  Administered 2018-12-21 – 2018-12-22 (×2): 10 mg via INTRAVENOUS
  Filled 2018-12-21 (×3): qty 1

## 2018-12-21 MED ORDER — ASPIRIN 325 MG PO TABS: 325.0000 mg | ORAL_TABLET | Freq: Every day | ORAL | Status: DC

## 2018-12-21 MED ORDER — ATORVASTATIN CALCIUM 80 MG PO TABS
80.0000 mg | ORAL_TABLET | Freq: Every day | ORAL | Status: DC
  Administered 2018-12-21: 80 mg via ORAL
  Filled 2018-12-21: qty 1

## 2018-12-21 NOTE — ED Triage Notes (Signed)
Patient c/o slurred speech and weakness to left side that started this morning upon waking this morning. Denies any pain, facial drooping, or dizziness. Patient unsure of when she went to sleep but states she knows that she went to sleep before 11am. Patient states woke this morning around 9am. Patient reports took 2mg  of xanax last night.

## 2018-12-21 NOTE — ED Notes (Signed)
Pt's pocketbook given to her sister at her request. Pt removed her phone charger and zipped bag. Was handed to sister as it was given to me.

## 2018-12-21 NOTE — ED Provider Notes (Addendum)
[facility] EMERGENCY DEPARTMENT Provider Note   CSN: [id number] Arrival date & time: 12/21/18  1057     History   Chief Complaint Chief Complaint  Patient presents with  . Aphasia    HPI Maria Lang is a 38 y.o. female.     HPI  The patient is a 38 year old female, she arrives with a chief complaint of left-sided weakness and difficulty speaking which she states was present on her awakening this morning.  It was not present when she went to bed last night prior to midnight.  She does have a known history of anxiety, hypertension, acid reflux and states that last night before going to bed she did take 2 mg of Xanax which is more than her 1 mg 3 times a day that she usually takes.  When she woke up this morning because of her symptoms she did not take any of her morning medications including blood pressure medication.  She reports that she feels like she is "high" because her speech is off, she feels frustrated by the inability to use her left arm and left leg.  She reports that she has never had anything like this in the past and denies any prior history of stroke, heart attack, drug use or alcohol use.  Her symptoms are persistent, nothing seems to make it better or worse, she arrives by private vehicle transport.  I personally saw and evaluated the patient on arrival at 11:00 AM  Past Medical History:  Diagnosis Date  . Anxiety   . Deviated septum   . GERD (gastroesophageal reflux disease)   . History of bilateral salpingectomy   . Hypertension   . Renal stones   . Sleep apnea    Doesn't use everyday    Patient Active Problem List   Diagnosis Date Noted  . Chest pain 12/10/2014  . GERD (gastroesophageal reflux disease) 12/10/2014  . Chronic diastolic CHF (congestive heart failure) ([place]) 12/10/2014  . Hypertension   . Pain in the chest   . Hypertensive emergency     Past Surgical History:  Procedure Laterality Date  . BILATERAL SALPINGECTOMY  07.2016  .  CYSTOSCOPY WITH RETROGRADE PYELOGRAM, URETEROSCOPY AND STENT PLACEMENT Right 07/29/2015   Procedure: CYSTOSCOPY WITH RIGHT RETROGRADE PYELOGRAM, RIGHT URETEROSCOPY AND STENT PLACEMENT;  Surgeon: Irine Seal, MD;  Location: [facility];  Service: Urology;  Laterality: Right;  . ECTOPIC PREGNANCY SURGERY  10/2013  . HOLMIUM LASER APPLICATION Right 12/29/2829   Procedure: HOLMIUM LASER APPLICATION;  Surgeon: Irine Seal, MD;  Location: [facility];  Service: Urology;  Laterality: Right;  . NASAL SEPTUM SURGERY     doen along with tonsillectomy   . TONSILLECTOMY    . TYMPANOSTOMY     x 3     OB History    Gravida  4   Para  3   Term  3   Preterm      AB  1   Living  3     SAB      TAB      Ectopic  1   Multiple      Live Births               Home Medications    Prior to Admission medications   Medication Sig Start Date End Date Taking? Authorizing Provider  norethindrone (AYGESTIN) 5 MG tablet Take 1 tablet by mouth daily. 11/22/17  Yes [provider]  ALPRAZolam Duanne Moron) 1 MG tablet Take 0.5-1 tablets by mouth  3 (three) times daily as needed for anxiety. 01/01/17   [provider]  amLODipine (NORVASC) 10 MG tablet Take 1 tablet (10 mg total) by mouth daily. 12/05/14   Evalee Jefferson, PA-C  levothyroxine (SYNTHROID, LEVOTHROID) 50 MCG tablet TAKE ONE TABLET BY MOUTH EVERY MORNING ON AN EMPTY STOMACH 09/28/17   [provider]  lisinopril (PRINIVIL,ZESTRIL) 40 MG tablet Take 40 mg by mouth daily.    [provider]  losartan-hydrochlorothiazide (HYZAAR) 50-12.5 MG tablet TAKE ONE TABLET BY MOUTH ONCE DAILY 09/28/17   [provider]  ondansetron (ZOFRAN ODT) 4 MG disintegrating tablet Take 1 tablet (4 mg total) by mouth every 6 (six) hours as needed for nausea or vomiting. 10/16/17   Levin Erp, PA  ondansetron (ZOFRAN) 4 MG tablet Take 4 mg by mouth every 6 (six) hours as needed for nausea or vomiting.    [provider]   pantoprazole (PROTONIX) 40 MG tablet TAKE 1 TABLET BY MOUTH TWICE DAILY BEFORE BREAKFAST AND DINNER 11/04/18   Ladene Artist, MD  propranolol (INDERAL) 40 MG tablet Take 90 mg by mouth daily.     [provider]  ranitidine (ZANTAC) 150 MG tablet TAKE ONE TABLET BY MOUTH TWICE DAILY 01/09/18   Esterwood, Amy S, PA-C  sertraline (ZOLOFT) 50 MG tablet Take 50 mg by mouth daily.    [provider]  varenicline (CHANTIX) 1 MG tablet Take 1 tablet by mouth 2 (two) times a day. 01/10/19 05/10/19  [provider]    Family History Family History  Problem Relation Age of Onset  . Hypertension Mother   . Hypertension Father   . Hyperlipidemia Father   . Diabetes Brother   . Diabetes Other     Social History Social History   Tobacco Use  . Smoking status: Current Every Day Smoker    Packs/day: 0.25    Years: 14.00    Pack years: 3.50    Types: Cigarettes    Start date: 10/04/1997    Last attempt to quit: 06/10/2014    Years since quitting: 4.5  . Smokeless tobacco: Never Used  Substance Use Topics  . Alcohol use: No  . Drug use: No     Allergies   Coreg [carvedilol], Hydrocodeine [dihydrocodeine], and Labetalol   Review of Systems Review of Systems  All other systems reviewed and are negative.    Physical Exam Updated Vital Signs There were no vitals taken for this visit.  Physical Exam Vitals signs and nursing note reviewed.  Constitutional:      General: She is in acute distress.     Appearance: She is well-developed.  HENT:     Head: Normocephalic and atraumatic.     Mouth/Throat:     Pharynx: No oropharyngeal exudate.  Eyes:     General: No scleral icterus.       Right eye: No discharge.        Left eye: No discharge.     Conjunctiva/sclera: Conjunctivae normal.     Pupils: Pupils are equal, round, and reactive to light.  Neck:     Musculoskeletal: Normal range of motion and neck supple.     Thyroid: No thyromegaly.      Vascular: No JVD.  Cardiovascular:     Rate and Rhythm: Regular rhythm. Tachycardia present.     Heart sounds: Normal heart sounds. No murmur. No friction rub. No gallop.      Comments: Pulse of 103, otherwise no murmurs Pulmonary:  Effort: Pulmonary effort is normal. No respiratory distress.     Breath sounds: Normal breath sounds. No wheezing or rales.  Abdominal:     General: Bowel sounds are normal. There is no distension.     Palpations: Abdomen is soft. There is no mass.     Tenderness: There is no abdominal tenderness.  Musculoskeletal: Normal range of motion.        General: No tenderness.  Lymphadenopathy:     Cervical: No cervical adenopathy.  Skin:    General: Skin is warm and dry.     Findings: No erythema or rash.  Neurological:     Mental Status: She is alert.     Motor: Weakness present.     Coordination: Coordination abnormal.     Comments: The patient speech is slightly slurred, she has the ability to raise both arms off the bed but has severe give out weakness on the left with dysmetria of the left upper extremity on finger-nose-finger testing.  She has the ability to raise both legs off the bed but has severe give out weakness on the left and difficulty with heel shin testing, right-sided upper extremity and lower extremity with normal strength sensation and coordination.  Cranial nerves III through XII appear normal, peripheral visual fields are normal  Psychiatric:        Behavior: Behavior normal.      ED Treatments / Results  Labs (all labs ordered are listed, but only abnormal results are displayed) Labs Reviewed  ETHANOL  PROTIME-INR  APTT  CBC  DIFFERENTIAL  COMPREHENSIVE METABOLIC PANEL  RAPID URINE DRUG SCREEN, HOSP PERFORMED  URINALYSIS, ROUTINE W REFLEX MICROSCOPIC  I-STAT CHEM 8, ED  POC URINE PREG, ED    EKG    Radiology No results found.  Procedures .Critical Care Performed by: Noemi Chapel, MD Authorized by: Noemi Chapel,  MD   Critical care provider statement:    Critical care time (minutes):  35   Critical care time was exclusive of:  Separately billable procedures and treating other patients and teaching time   Critical care was necessary to treat or prevent imminent or life-threatening deterioration of the following conditions:  CNS failure or compromise   Critical care was time spent personally by me on the following activities:  Blood draw for specimens, development of treatment plan with patient or surrogate, discussions with consultants, evaluation of patient's response to treatment, examination of patient, obtaining history from patient or surrogate, ordering and performing treatments and interventions, ordering and review of laboratory studies, ordering and review of radiographic studies, pulse oximetry, re-evaluation of patient's condition and review of old charts   (including critical care time)  Medications Ordered in ED Medications - No data to display   Initial Impression / Assessment and Plan / ED Course  I have reviewed the triage vital signs and the nursing notes.  Pertinent labs & imaging results that were available during my care of the patient were reviewed by me and considered in my medical decision making (see chart for details).  Clinical Course as of Dec 21 1202  Sat Dec 21, 2018  1122 Stroke was activated on the patient's arrival   [BM]  1122 Head CT reported to me verbally by radiology at 11:23 AM to be negative for acute stroke.  This was noncontrast.  We will proceed with angiograms due to possibility of large vessel occlusion with speech involvement   [BM]    Clinical Course User Index [BM] Noemi Chapel, MD  It is unclear what is causing the patient symptoms of stroke could definitely be an etiology, the give out weakness suggest this may be more of a anxiety or psychological condition, will activate code stroke.  I discussed care with the neurology team, they  recommend that the patient be admitted for what is likely an acute ischemic lacunar stroke.  Her blood pressure is severely elevated at 204/126 and is requiring intravenous antihypertensives due to the severe hypertension.  Goal being to have diastolic less than 561.  The patient is not a candidate for vascular intervention either mechanically or thrombolytics according to neurology.  She awoke with the symptoms after last being normal last night and has no signs of large vessel occlusion on angiogram, perfusion scan does not show any large areas of inappropriate perfusion.  The patient is critically ill with an acute ischemic stroke, will admit to the hospitalist, full dose aspirin given  D/w hospitalist Dr. Wynetta Emery who will coordinate transfer and admission - needs to go to [facility] hospital - higher level of care / stroke center  Final Clinical Impressions(s) / ED Diagnoses   Final diagnoses:  Hypertensive crisis  Acute ischemic stroke [facility])      Noemi Chapel, MD 12/21/18 1330    Noemi Chapel, MD 12/21/18 1335

## 2018-12-21 NOTE — H&P (Addendum)
History and Physical  Kennedy Brines KPT:[id number] DOB: [birth year] DOA: 12/21/2018  Referring physician: Sabra Heck MD  PCP: Harlene Salts, PA-C   Chief Complaint: slurred speech   HPI: Maria Lang is a 38 y.o. female with longstanding hypertension, tobacco use, OSA, GERD, GAD presented to ED complaining of slurred speech and weakness involving the left arm and leg and difficulty with walking after waking up this morning and feeling very drowsy.  She denies shortness of breath and chest pain.  She was last known to be normal just prior to according to bed yesterday evening.  She woke up at around 9 AM.  She apparently had taken 2 mg of Xanax last night.  She takes this for GAD.  She denies cough fever chills and sick contacts.  ED Course: Patient was hypertensive on arrival with a systolic blood pressure greater than 220.  Code stroke was called and she was seen by tele-neurologist who examined her and found her to have left-sided weakness ataxia and left-sided facial weakness.  Symptoms were suggestive of right pontine stroke.  The patient had CTA of the head and neck that was negative.  No LVO.  She was not a candidate for TPA.  It was recommended that patient start aspirin 325 mg.  The patient was recommended for admission for a full stroke work-up.  EKG showed signs of sinus tachycardia.  Patient had normal electrolytes.  CT brain did not show acute findings.  Review of Systems: All systems reviewed and apart from history of presenting illness, are negative.  Past Medical History:  Diagnosis Date   Anxiety    Deviated septum    GERD (gastroesophageal reflux disease)    History of bilateral salpingectomy    Hypertension    Renal stones    Sleep apnea    Doesn't use everyday   Past Surgical History:  Procedure Laterality Date   BILATERAL SALPINGECTOMY  07.2016   CYSTOSCOPY WITH RETROGRADE PYELOGRAM, URETEROSCOPY AND STENT PLACEMENT Right 07/29/2015   Procedure:  CYSTOSCOPY WITH RIGHT RETROGRADE PYELOGRAM, RIGHT URETEROSCOPY AND STENT PLACEMENT;  Surgeon: Irine Seal, MD;  Location: [facility];  Service: Urology;  Laterality: Right;   ECTOPIC PREGNANCY SURGERY  10/2013   HOLMIUM LASER APPLICATION Right 07/03/15   Procedure: HOLMIUM LASER APPLICATION;  Surgeon: Irine Seal, MD;  Location: [facility];  Service: Urology;  Laterality: Right;   NASAL SEPTUM SURGERY     doen along with tonsillectomy    TONSILLECTOMY     TYMPANOSTOMY     x 3   Social History:  reports that she has been smoking cigarettes. She started smoking about 21 years ago. She has a 3.50 pack-year smoking history. She has never used smokeless tobacco. She reports that she does not drink alcohol or use drugs.  Allergies  Allergen Reactions   Hydrocodeine [Dihydrocodeine] Shortness Of Breath and Nausea And Vomiting   Hydrocodone-Acetaminophen Shortness Of Breath and Nausea Only   Coreg [Carvedilol]     Headache    Labetalol Other (See Comments)    Head itches    Family History  Problem Relation Age of Onset   Hypertension Mother    Hypertension Father    Hyperlipidemia Father    Diabetes Brother    Diabetes Other     Prior to Admission medications   Medication Sig Start Date End Date Taking? Authorizing Provider  ALPRAZolam Duanne Moron) 1 MG tablet Take 0.5-1 tablets by mouth 3 (three) times daily as needed for anxiety. 01/01/17  Yes  [provider]  amLODipine (NORVASC) 10 MG tablet Take 1 tablet (10 mg total) by mouth daily. 12/05/14  Yes Idol, Almyra Free, PA-C  fluticasone (FLONASE) 50 MCG/ACT nasal spray Place 2 sprays into both nostrils daily as needed. 05/11/16  Yes [provider]  hydrocortisone (PROCTO-MED HC) 2.5 % rectal cream Place 1 application rectally daily as needed. 08/16/16  Yes [provider]  levothyroxine (SYNTHROID, LEVOTHROID) 50 MCG tablet TAKE ONE TABLET BY MOUTH EVERY MORNING ON AN EMPTY STOMACH 09/28/17  Yes [provider]    losartan-hydrochlorothiazide (HYZAAR) 50-12.5 MG tablet TAKE ONE TABLET BY MOUTH ONCE DAILY 09/28/17  Yes [provider]  ondansetron (ZOFRAN ODT) 4 MG disintegrating tablet Take 1 tablet (4 mg total) by mouth every 6 (six) hours as needed for nausea or vomiting. 10/16/17  Yes Levin Erp, PA  pantoprazole (PROTONIX) 40 MG tablet TAKE 1 TABLET BY MOUTH TWICE DAILY BEFORE BREAKFAST AND DINNER 11/04/18  Yes Ladene Artist, MD  propranolol ER (INDERAL LA) 60 MG 24 hr capsule Take 2 capsules by mouth daily. 10/01/17  Yes [provider]  sertraline (ZOLOFT) 100 MG tablet Take 150 mg by mouth daily. 10/01/18  Yes [provider]  varenicline (CHANTIX) 1 MG tablet Take 1 tablet by mouth 2 (two) times a day. 01/10/19 05/10/19  [provider]   Physical Exam: Vitals:   12/21/18 1144 12/21/18 1146 12/21/18 1200 12/21/18 1230  BP: (!) 203/129 (!) 204/126 (!) 196/127 (!) 164/106  Pulse: 92 91 89 95  Resp: 16 19 15 15   Temp:      TempSrc:      SpO2: 99% 98% 99% 96%  Weight:      Height:         General exam: Moderately built and nourished patient, lying comfortably supine on the gurney in no obvious distress.  Head, eyes and ENT: Nontraumatic and normocephalic. Pupils equally reacting to light and accommodation. Oral mucosa dry.  Neck: Supple. No JVD, carotid bruit or thyromegaly.  Lymphatics: No lymphadenopathy.  Respiratory system: Clear to auscultation. No increased work of breathing.  Cardiovascular system: S1 and S2 heard, RRR. No JVD, murmurs, gallops, clicks or pedal edema.  Gastrointestinal system: Abdomen is nondistended, soft and nontender. Normal bowel sounds heard. No organomegaly or masses appreciated.  Central nervous system: Alert and oriented. Left sided weakness and ataxia, no facial droop, dysarthria noted.   Extremities: Symmetric 5 x 5 power. Peripheral pulses symmetrically felt.   Skin: No rashes or acute  findings.  Musculoskeletal system: Negative exam.  Psychiatry: Pleasant and cooperative.  Labs on Admission:  Basic Metabolic Panel: Recent Labs  Lab 12/21/18 1121  NA 139  K 4.0  CL 103  CO2 25  GLUCOSE 129*  BUN 14  CREATININE 0.71  CALCIUM 9.8   Liver Function Tests: Recent Labs  Lab 12/21/18 1121  AST 13*  ALT 15  ALKPHOS 66  BILITOT 0.3  PROT 8.1  ALBUMIN 4.6   No results for input(s): LIPASE, AMYLASE in the last 168 hours. No results for input(s): AMMONIA in the last 168 hours. CBC: Recent Labs  Lab 12/21/18 1121  WBC 9.7  NEUTROABS 6.8  HGB 11.8*  HCT 38.5  MCV 88.3  PLT 305   Cardiac Enzymes: No results for input(s): CKTOTAL, CKMB, CKMBINDEX, TROPONINI in the last 168 hours.  BNP (last 3 results) No results for input(s): PROBNP in the last 8760 hours. CBG: No results for input(s): GLUCAP in the last 168  hours.  Radiological Exams on Admission: Ct Code Stroke Cta Head W/wo Contrast  Result Date: 12/21/2018 CLINICAL DATA:  Left-sided weakness EXAM: CT ANGIOGRAPHY HEAD AND NECK CT PERFUSION BRAIN TECHNIQUE: Multidetector CT imaging of the head and neck was performed using the standard protocol during bolus administration of intravenous contrast. Multiplanar CT image reconstructions and MIPs were obtained to evaluate the vascular anatomy. Carotid stenosis measurements (when applicable) are obtained utilizing NASCET criteria, using the distal internal carotid diameter as the denominator. Multiphase CT imaging of the brain was performed following IV bolus contrast injection. Subsequent parametric perfusion maps were calculated using RAPID software. CONTRAST:  1104mL OMNIPAQUE IOHEXOL 350 MG/ML SOLN COMPARISON:  Noncontrast head CT from earlier today FINDINGS: CTA NECK FINDINGS Aortic arch: Normal Right carotid system: Vessels are smooth and widely patent. No atheromatous changes. Left carotid system: Vessels are smooth and widely patent. No atheromatous  changes. Vertebral arteries: No proximal subclavian stenosis. Scant atheromatous changes of the proximal left subclavian on coronal reformats. Both vertebral arteries are smooth and widely patent to the dura. Skeleton: Under pneumatized mastoids. There is partial left mastoid and middle ear opacification. Other neck: No evidence of mass or adenopathy. Upper chest: Negative Review of the MIP images confirms the above findings CTA HEAD FINDINGS Anterior circulation: Vessels are smooth and widely patent. No aneurysm. Posterior circulation: Vessels are smooth and widely patent. Negative for aneurysm. Venous sinuses: Patent Anatomic variants: Fetal type left PCA Delayed phase: Not obtained Review of the MIP images confirms the above findings CT Brain Perfusion Findings: No abnormal perfusion to correlate with symptoms. Left fronto temporal highlighting on maps is attributed to motion artifact and does not correlate with left-sided weakness. IMPRESSION: 1. Negative CTA of head and neck. 2. Negative CT perfusion when allowing for motion artifact. Electronically Signed   By: Monte Fantasia M.D.   On: 12/21/2018 11:48   Ct Code Stroke Cta Neck W/wo Contrast  Result Date: 12/21/2018 CLINICAL DATA:  Left-sided weakness EXAM: CT ANGIOGRAPHY HEAD AND NECK CT PERFUSION BRAIN TECHNIQUE: Multidetector CT imaging of the head and neck was performed using the standard protocol during bolus administration of intravenous contrast. Multiplanar CT image reconstructions and MIPs were obtained to evaluate the vascular anatomy. Carotid stenosis measurements (when applicable) are obtained utilizing NASCET criteria, using the distal internal carotid diameter as the denominator. Multiphase CT imaging of the brain was performed following IV bolus contrast injection. Subsequent parametric perfusion maps were calculated using RAPID software. CONTRAST:  156mL OMNIPAQUE IOHEXOL 350 MG/ML SOLN COMPARISON:  Noncontrast head CT from earlier today  FINDINGS: CTA NECK FINDINGS Aortic arch: Normal Right carotid system: Vessels are smooth and widely patent. No atheromatous changes. Left carotid system: Vessels are smooth and widely patent. No atheromatous changes. Vertebral arteries: No proximal subclavian stenosis. Scant atheromatous changes of the proximal left subclavian on coronal reformats. Both vertebral arteries are smooth and widely patent to the dura. Skeleton: Under pneumatized mastoids. There is partial left mastoid and middle ear opacification. Other neck: No evidence of mass or adenopathy. Upper chest: Negative Review of the MIP images confirms the above findings CTA HEAD FINDINGS Anterior circulation: Vessels are smooth and widely patent. No aneurysm. Posterior circulation: Vessels are smooth and widely patent. Negative for aneurysm. Venous sinuses: Patent Anatomic variants: Fetal type left PCA Delayed phase: Not obtained Review of the MIP images confirms the above findings CT Brain Perfusion Findings: No abnormal perfusion to correlate with symptoms. Left fronto temporal highlighting on maps is attributed to motion artifact  and does not correlate with left-sided weakness. IMPRESSION: 1. Negative CTA of head and neck. 2. Negative CT perfusion when allowing for motion artifact. Electronically Signed   By: Monte Fantasia M.D.   On: 12/21/2018 11:48   Ct Code Stroke Cta Cerebral Perfusion W/wo Contrast  Result Date: 12/21/2018 CLINICAL DATA:  Left-sided weakness EXAM: CT ANGIOGRAPHY HEAD AND NECK CT PERFUSION BRAIN TECHNIQUE: Multidetector CT imaging of the head and neck was performed using the standard protocol during bolus administration of intravenous contrast. Multiplanar CT image reconstructions and MIPs were obtained to evaluate the vascular anatomy. Carotid stenosis measurements (when applicable) are obtained utilizing NASCET criteria, using the distal internal carotid diameter as the denominator. Multiphase CT imaging of the brain was  performed following IV bolus contrast injection. Subsequent parametric perfusion maps were calculated using RAPID software. CONTRAST:  178mL OMNIPAQUE IOHEXOL 350 MG/ML SOLN COMPARISON:  Noncontrast head CT from earlier today FINDINGS: CTA NECK FINDINGS Aortic arch: Normal Right carotid system: Vessels are smooth and widely patent. No atheromatous changes. Left carotid system: Vessels are smooth and widely patent. No atheromatous changes. Vertebral arteries: No proximal subclavian stenosis. Scant atheromatous changes of the proximal left subclavian on coronal reformats. Both vertebral arteries are smooth and widely patent to the dura. Skeleton: Under pneumatized mastoids. There is partial left mastoid and middle ear opacification. Other neck: No evidence of mass or adenopathy. Upper chest: Negative Review of the MIP images confirms the above findings CTA HEAD FINDINGS Anterior circulation: Vessels are smooth and widely patent. No aneurysm. Posterior circulation: Vessels are smooth and widely patent. Negative for aneurysm. Venous sinuses: Patent Anatomic variants: Fetal type left PCA Delayed phase: Not obtained Review of the MIP images confirms the above findings CT Brain Perfusion Findings: No abnormal perfusion to correlate with symptoms. Left fronto temporal highlighting on maps is attributed to motion artifact and does not correlate with left-sided weakness. IMPRESSION: 1. Negative CTA of head and neck. 2. Negative CT perfusion when allowing for motion artifact. Electronically Signed   By: Monte Fantasia M.D.   On: 12/21/2018 11:48   Ct Head Code Stroke Wo Contrast  Result Date: 12/21/2018 CLINICAL DATA:  Code stroke. Left-sided weakness. Last seen normal last night EXAM: CT HEAD WITHOUT CONTRAST TECHNIQUE: Contiguous axial images were obtained from the base of the skull through the vertex without intravenous contrast. COMPARISON:  05/22/2016 FINDINGS: Brain: No evidence of acute infarction, hemorrhage,  hydrocephalus, extra-axial collection or mass lesion/mass effect. Vascular: No hyperdense vessel or unexpected calcification. Skull: Normal. Negative for fracture or focal lesion. Sinuses/Orbits: No acute finding. Other: These results were called by telephone at the time of interpretation on 12/21/2018 at 11:22 am to Dr. Noemi Chapel , who verbally acknowledged these results. ASPECTS [facility] Stroke Program Early CT Score) - Ganglionic level infarction (caudate, lentiform nuclei, internal capsule, insula, M1-M3 cortex): 7 - Supraganglionic infarction (M4-M6 cortex): 3 Total score (0-10 with 10 being normal): 10 IMPRESSION: Negative. ASPECTS is 10. Electronically Signed   By: Monte Fantasia M.D.   On: 12/21/2018 11:24   EKG: Independently reviewed. Sinus tachycardia  Assessment/Plan Principal Problem:   Acute CVA (cerebrovascular accident) ([place]) Active Problems:   Hypertensive emergency   Benzodiazepine dependence ([place])   Acute Left-sided weakness   Acute Speech abnormality   Hyperglycemia   Sleep apnea   Anxiety   Hypothyroidism   Hypertension   GERD (gastroesophageal reflux disease)   Chronic diastolic CHF (congestive heart failure) ([place])   Smoker  1. Acute CVA - her  symptoms are highly suggestive of an acute right pontine CVA.  Her initial CT was negative. Unable to obtain MRI at [facility] and will transfer to [facility] for MRI and neurology consultation.  Appreciate recommendations of teleneurologist. Will allow permissive hypertension, continue aspirin 325 mg daily, check lipid panel, A1c.  Obtain SLP eval, PT eval.  Try to get patient to stop smoking.  Elevate head of bed 30 degrees and observe aspiration precautions.  Pt will be NPO until passing a swallow screen and then start heart healthy diet.  Start normal saline infusion.  Further recommendations pending subspecialty consultations.  Obtain carotid doppler study.  2. OSA - will offer nightly CPAP.  3. Hypothyroidism - check TSH, resume home  levothyroxine.  4. GAD - will provide low dose anxiolytics as needed.  5. GERD - protonix ordered for GI protection.  6. Hypertensive emergency - per neurologist recommendations, will allow permissive hypertension up to 220/110.  IV hydralazine ordered for any pressures over that target.   7. Hyperglycemia - check A1c.   8. Tobacco - Pt strongly advised to discontinue all tobacco use and will provide nicotine patch to use while in hospital.   9. Chronic diastolic CHF - will obtain 2D echocardiogram.    DVT Prophylaxis: lovenox  Code Status: Full   Family Communication: patient updated at bedside  Disposition Plan: inpatient to [facility] telemetry    Time spent: 55 minutes   Irwin Brakeman, MD [facility] How to contact the [facility] Attending or Consulting provider [place] or covering provider during after hours [place], for this patient?  1. Check the care team in [facility] and look for a) attending/consulting [facility] provider listed and b) the [facility] team listed 2. Log into [email or web address] and use [place]'s universal password to access. If you do not have the password, please contact the hospital operator. 3. Locate the [facility] provider you are looking for under [facility] and page to a number that you can be directly reached. 4. If you still have difficulty reaching the provider, please page the [facility] (Director on Call) for the Hospitalists listed on amion for assistance.

## 2018-12-21 NOTE — ED Notes (Signed)
ED TO INPATIENT HANDOFF REPORT  ED Nurse Name and Phone #: Gaspar Bidding [phone]  S Name/Age/Gender Maria Lang 38 y.o. female Room/Bed: APA18/APA18  Code Status   Code Status: Prior  Home/SNF/Other Home Patient oriented to: self, place, time and situation Is this baseline? Yes   Triage Complete: Triage complete  Chief Complaint Possible Stroke  Triage Note Patient c/o slurred speech and weakness to left side that started this morning upon waking this morning. Denies any pain, facial drooping, or dizziness. Patient unsure of when she went to sleep but states she knows that she went to sleep before 11am. Patient states woke this morning around 9am. Patient reports took 2mg  of xanax last night.    Allergies Allergies  Allergen Reactions  . Hydrocodeine [Dihydrocodeine] Shortness Of Breath and Nausea And Vomiting  . Hydrocodone-Acetaminophen Shortness Of Breath and Nausea Only  . Coreg [Carvedilol]     Headache   . Labetalol Other (See Comments)    Head itches    Level of Care/Admitting Diagnosis ED Disposition    ED Disposition Condition Comment   Admit  Hospital Area: [facility] [[id number]]  Level of Care: Telemetry Medical [104]  Covid Evaluation: Screening Protocol (No Symptoms)  Diagnosis: Acute CVA (cerebrovascular accident) [facility]) [[id number]]  Admitting Physician: Murlean Iba [[id number]]  Attending Physician: Murlean Iba [[id number]]  Estimated length of stay: past midnight tomorrow  Certification:: I certify this patient will need inpatient services for at least 2 midnights  PT Class (Do Not Modify): Inpatient [101]  PT Acc Code (Do Not Modify): Private [1]       B Medical/Surgery History Past Medical History:  Diagnosis Date  . Anxiety   . Deviated septum   . GERD (gastroesophageal reflux disease)   . History of bilateral salpingectomy   . Hypertension   . Renal stones   . Sleep apnea    Doesn't use everyday   Past Surgical  History:  Procedure Laterality Date  . BILATERAL SALPINGECTOMY  07.2016  . CYSTOSCOPY WITH RETROGRADE PYELOGRAM, URETEROSCOPY AND STENT PLACEMENT Right 07/29/2015   Procedure: CYSTOSCOPY WITH RIGHT RETROGRADE PYELOGRAM, RIGHT URETEROSCOPY AND STENT PLACEMENT;  Surgeon: Irine Seal, MD;  Location: [facility];  Service: Urology;  Laterality: Right;  . ECTOPIC PREGNANCY SURGERY  10/2013  . HOLMIUM LASER APPLICATION Right 06/28/7515   Procedure: HOLMIUM LASER APPLICATION;  Surgeon: Irine Seal, MD;  Location: [facility];  Service: Urology;  Laterality: Right;  . NASAL SEPTUM SURGERY     doen along with tonsillectomy   . TONSILLECTOMY    . TYMPANOSTOMY     x 3     A IV Location/Drains/Wounds Patient Lines/Drains/Airways Status   Active Line/Drains/Airways    Name:   Placement date:   Placement time:   Site:   Days:   Peripheral IV 12/21/18 Left Antecubital   12/21/18    1110    Antecubital   less than 1   Peripheral IV 12/21/18 Right Antecubital   12/21/18    1125    Antecubital   less than 1   Ureteral Drain/Stent Right ureter 6 Fr.   07/29/15    1221    Right ureter   1241   External Urinary Catheter   12/21/18    1325    -   less than 1          Intake/Output Last 24 hours No intake or output data in the 24 hours ending 12/21/18 1455  Labs/Imaging Results for orders placed  or performed during the hospital encounter of 12/21/18 (from the past 48 hour(s))  Ethanol     Status: None   Collection Time: 12/21/18 11:21 AM  Result Value Ref Range   Alcohol, Ethyl (B) <10 <10 mg/dL    Comment: (NOTE) Lowest detectable limit for serum alcohol is 10 mg/dL. For medical purposes only. Performed at [facility], [address], [place], [place] [address]   Protime-INR     Status: None   Collection Time: 12/21/18 11:21 AM  Result Value Ref Range   Prothrombin Time 13.1 11.4 - 15.2 seconds   INR 1.0 0.8 - 1.2    Comment: (NOTE) INR goal varies based on device and disease states. Performed at [facility], [address] [place], [place] [address]   APTT     Status: None   Collection Time: 12/21/18 11:21 AM  Result Value Ref Range   aPTT 29 24 - 36 seconds    Comment: Performed at [facility], [address], [place], [place] [address]  CBC     Status: Abnormal   Collection Time: 12/21/18 11:21 AM  Result Value Ref Range   WBC 9.7 4.0 - 10.5 K/uL   RBC 4.36 3.87 - 5.11 MIL/uL   Hemoglobin 11.8 (L) 12.0 - 15.0 g/dL   HCT 38.5 36.0 - 46.0 %   MCV 88.3 80.0 - 100.0 fL   MCH 27.1 26.0 - 34.0 pg   MCHC 30.6 30.0 - 36.0 g/dL   RDW 14.6 11.5 - 15.5 %   Platelets 305 150 - 400 K/uL   nRBC 0.0 0.0 - 0.2 %    Comment: Performed at [facility], [address] [place], [place] [address]  Differential     Status: None   Collection Time: 12/21/18 11:21 AM  Result Value Ref Range   Neutrophils Relative % 70 %   Neutro Abs 6.8 1.7 - 7.7 K/uL   Lymphocytes Relative 22 %   Lymphs Abs 2.1 0.7 - 4.0 K/uL   Monocytes Relative 7 %   Monocytes Absolute 0.6 0.1 - 1.0 K/uL   Eosinophils Relative 0 %   Eosinophils Absolute 0.0 0.0 - 0.5 K/uL   Basophils Relative 0 %   Basophils Absolute 0.0 0.0 - 0.1 K/uL   Immature Granulocytes 1 %   Abs Immature Granulocytes 0.05 0.00 - 0.07 K/uL    Comment: Performed at [facility], [address], [place], [place] [address]  Comprehensive metabolic panel     Status: Abnormal   Collection Time: 12/21/18 11:21 AM  Result Value Ref Range   Sodium 139 135 - 145 mmol/L   Potassium 4.0 3.5 - 5.1 mmol/L   Chloride 103 98 - 111 mmol/L   CO2 25 22 - 32 mmol/L   Glucose, Bld 129 (H) 70 - 99 mg/dL   BUN 14 6 - 20 mg/dL   Creatinine, Ser 0.71 0.44 - 1.00 mg/dL   Calcium 9.8 8.9 - 10.3 mg/dL   Total Protein 8.1 6.5 - 8.1 g/dL   Albumin 4.6 3.5 - 5.0 g/dL   AST 13 (L) 15 - 41 U/L   ALT 15 0 - 44 U/L   Alkaline Phosphatase 66 38 - 126 U/L   Total Bilirubin 0.3 0.3 - 1.2 mg/dL   GFR calc non Af Amer >60 >60 mL/min   GFR calc Af Amer >60 >60 mL/min   Anion  gap 11 5 - 15    Comment: Performed at [facility], [place]  [address] [place], [place] [address]  Lipid panel     Status: Abnormal   Collection Time: 12/21/18 11:21 AM  Result Value Ref Range   Cholesterol 235 (H) 0 - 200 mg/dL   Triglycerides 298 (H) <150 mg/dL   HDL 29 (L) >40 mg/dL   Total CHOL/HDL Ratio 8.1 RATIO   VLDL 60 (H) 0 - 40 mg/dL   LDL Cholesterol 146 (H) 0 - 99 mg/dL    Comment:        Total Cholesterol/HDL:CHD Risk Coronary Heart Disease Risk Table                     Men   Women  1/2 Average Risk   3.4   3.3  Average Risk       5.0   4.4  2 X Average Risk   9.6   7.1  3 X Average Risk  23.4   11.0        Use the calculated Patient Ratio above and the CHD Risk Table to determine the patient's CHD Risk.        ATP III CLASSIFICATION (LDL):  <100     mg/dL   Optimal  100-129  mg/dL   Near or Above                    Optimal  130-159  mg/dL   Borderline  160-189  mg/dL   High  >190     mg/dL   Very High Performed at [place] [place], [place] [address]   POC urine preg, ED     Status: None   Collection Time: 12/21/18 12:15 PM  Result Value Ref Range   Preg Test, Ur NEGATIVE NEGATIVE    Comment:        THE SENSITIVITY OF THIS METHODOLOGY IS >24 mIU/mL   Urine rapid drug screen (hosp performed)     Status: Abnormal   Collection Time: 12/21/18 12:17 PM  Result Value Ref Range   Opiates NONE DETECTED NONE DETECTED   Cocaine NONE DETECTED NONE DETECTED   Benzodiazepines POSITIVE (A) NONE DETECTED   Amphetamines NONE DETECTED NONE DETECTED   Tetrahydrocannabinol NONE DETECTED NONE DETECTED   Barbiturates NONE DETECTED NONE DETECTED    Comment: (NOTE) DRUG SCREEN FOR MEDICAL PURPOSES ONLY.  IF CONFIRMATION IS NEEDED FOR ANY PURPOSE, NOTIFY LAB WITHIN 5 DAYS. LOWEST DETECTABLE LIMITS FOR URINE DRUG SCREEN Drug Class                     Cutoff (ng/mL) Amphetamine and metabolites    1000 Barbiturate and metabolites    200 Benzodiazepine                  229 Tricyclics and metabolites     300 Opiates and metabolites        300 Cocaine and metabolites        300 THC                            50 Performed at [facility], [address], [place],  [address]   Urinalysis, Routine w reflex microscopic     Status: Abnormal   Collection Time: 12/21/18 12:17 PM  Result Value Ref Range   Color, Urine YELLOW YELLOW   APPearance CLOUDY (A) CLEAR   Specific Gravity, Urine 1.029 1.005 - 1.030   pH 7.0 5.0 - 8.0  Glucose, UA NEGATIVE NEGATIVE mg/dL   Hgb urine dipstick NEGATIVE NEGATIVE   Bilirubin Urine NEGATIVE NEGATIVE   Ketones, ur NEGATIVE NEGATIVE mg/dL   Protein, ur NEGATIVE NEGATIVE mg/dL   Nitrite NEGATIVE NEGATIVE   Leukocytes,Ua MODERATE (A) NEGATIVE   RBC / HPF 0-5 0 - 5 RBC/hpf   WBC, UA 21-50 0 - 5 WBC/hpf   Bacteria, UA RARE (A) NONE SEEN   Squamous Epithelial / LPF >50 (H) 0 - 5   Sperm, UA PRESENT     Comment: Performed at [facility], [address], [place], [place] [address]  SARS Coronavirus 2 (CEPHEID - Performed in [place] hospital lab), Hosp Order     Status: None   Collection Time: 12/21/18  1:33 PM   Specimen: Nasopharyngeal Swab  Result Value Ref Range   SARS Coronavirus 2 NEGATIVE NEGATIVE    Comment: (NOTE) If result is NEGATIVE SARS-CoV-2 target nucleic acids are NOT DETECTED. The SARS-CoV-2 RNA is generally detectable in upper and lower  respiratory specimens during the acute phase of infection. The lowest  concentration of SARS-CoV-2 viral copies this assay can detect is 250  copies / mL. A negative result does not preclude SARS-CoV-2 infection  and should not be used as the sole basis for treatment or other  patient management decisions.  A negative result may occur with  improper specimen collection / handling, submission of specimen other  than nasopharyngeal swab, presence of viral mutation(s) within the  areas targeted by this assay, and inadequate number of viral copies   (<250 copies / mL). A negative result must be combined with clinical  observations, patient history, and epidemiological information. If result is POSITIVE SARS-CoV-2 target nucleic acids are DETECTED. The SARS-CoV-2 RNA is generally detectable in upper and lower  respiratory specimens dur ing the acute phase of infection.  Positive  results are indicative of active infection with SARS-CoV-2.  Clinical  correlation with patient history and other diagnostic information is  necessary to determine patient infection status.  Positive results do  not rule out bacterial infection or co-infection with other viruses. If result is PRESUMPTIVE POSTIVE SARS-CoV-2 nucleic acids MAY BE PRESENT.   A presumptive positive result was obtained on the submitted specimen  and confirmed on repeat testing.  While 2019 novel coronavirus  (SARS-CoV-2) nucleic acids may be present in the submitted sample  additional confirmatory testing may be necessary for epidemiological  and / or clinical management purposes  to differentiate between  SARS-CoV-2 and other Sarbecovirus currently known to infect humans.  If clinically indicated additional testing with an alternate test  methodology [phone]) is advised. The SARS-CoV-2 RNA is generally  detectable in upper and lower respiratory sp ecimens during the acute  phase of infection. The expected result is Negative. Fact Sheet for Patients:  StrictlyIdeas.no Fact Sheet for Healthcare Providers: BankingDealers.co.za This test is not yet approved or cleared by the [place] FDA and has been authorized for detection and/or diagnosis of SARS-CoV-2 by FDA under an Emergency Use Authorization (EUA).  This EUA will remain in effect (meaning this test can be used) for the duration of the COVID-19 declaration under Section 564(b)(1) of the Act, 21 U.S.C. section 360bbb-3(b)(1), unless the authorization is terminated  or revoked sooner. Performed at [facility], [address], [place], [place] [address]    Ct Code Stroke Cta Head W/wo Contrast  Result Date: 12/21/2018 CLINICAL DATA:  Left-sided weakness EXAM: CT ANGIOGRAPHY HEAD AND NECK CT PERFUSION BRAIN TECHNIQUE: Multidetector  CT imaging of the head and neck was performed using the standard protocol during bolus administration of intravenous contrast. Multiplanar CT image reconstructions and MIPs were obtained to evaluate the vascular anatomy. Carotid stenosis measurements (when applicable) are obtained utilizing NASCET criteria, using the distal internal carotid diameter as the denominator. Multiphase CT imaging of the brain was performed following IV bolus contrast injection. Subsequent parametric perfusion maps were calculated using RAPID software. CONTRAST:  17mL OMNIPAQUE IOHEXOL 350 MG/ML SOLN COMPARISON:  Noncontrast head CT from earlier today FINDINGS: CTA NECK FINDINGS Aortic arch: Normal Right carotid system: Vessels are smooth and widely patent. No atheromatous changes. Left carotid system: Vessels are smooth and widely patent. No atheromatous changes. Vertebral arteries: No proximal subclavian stenosis. Scant atheromatous changes of the proximal left subclavian on coronal reformats. Both vertebral arteries are smooth and widely patent to the dura. Skeleton: Under pneumatized mastoids. There is partial left mastoid and middle ear opacification. Other neck: No evidence of mass or adenopathy. Upper chest: Negative Review of the MIP images confirms the above findings CTA HEAD FINDINGS Anterior circulation: Vessels are smooth and widely patent. No aneurysm. Posterior circulation: Vessels are smooth and widely patent. Negative for aneurysm. Venous sinuses: Patent Anatomic variants: Fetal type left PCA Delayed phase: Not obtained Review of the MIP images confirms the above findings CT Brain Perfusion Findings: No abnormal perfusion to correlate with symptoms.  Left fronto temporal highlighting on maps is attributed to motion artifact and does not correlate with left-sided weakness. IMPRESSION: 1. Negative CTA of head and neck. 2. Negative CT perfusion when allowing for motion artifact. Electronically Signed   By: Monte Fantasia M.D.   On: 12/21/2018 11:48   Ct Code Stroke Cta Neck W/wo Contrast  Result Date: 12/21/2018 CLINICAL DATA:  Left-sided weakness EXAM: CT ANGIOGRAPHY HEAD AND NECK CT PERFUSION BRAIN TECHNIQUE: Multidetector CT imaging of the head and neck was performed using the standard protocol during bolus administration of intravenous contrast. Multiplanar CT image reconstructions and MIPs were obtained to evaluate the vascular anatomy. Carotid stenosis measurements (when applicable) are obtained utilizing NASCET criteria, using the distal internal carotid diameter as the denominator. Multiphase CT imaging of the brain was performed following IV bolus contrast injection. Subsequent parametric perfusion maps were calculated using RAPID software. CONTRAST:  172mL OMNIPAQUE IOHEXOL 350 MG/ML SOLN COMPARISON:  Noncontrast head CT from earlier today FINDINGS: CTA NECK FINDINGS Aortic arch: Normal Right carotid system: Vessels are smooth and widely patent. No atheromatous changes. Left carotid system: Vessels are smooth and widely patent. No atheromatous changes. Vertebral arteries: No proximal subclavian stenosis. Scant atheromatous changes of the proximal left subclavian on coronal reformats. Both vertebral arteries are smooth and widely patent to the dura. Skeleton: Under pneumatized mastoids. There is partial left mastoid and middle ear opacification. Other neck: No evidence of mass or adenopathy. Upper chest: Negative Review of the MIP images confirms the above findings CTA HEAD FINDINGS Anterior circulation: Vessels are smooth and widely patent. No aneurysm. Posterior circulation: Vessels are smooth and widely patent. Negative for aneurysm. Venous  sinuses: Patent Anatomic variants: Fetal type left PCA Delayed phase: Not obtained Review of the MIP images confirms the above findings CT Brain Perfusion Findings: No abnormal perfusion to correlate with symptoms. Left fronto temporal highlighting on maps is attributed to motion artifact and does not correlate with left-sided weakness. IMPRESSION: 1. Negative CTA of head and neck. 2. Negative CT perfusion when allowing for motion artifact. Electronically Signed   By: Neva Seat.D.  On: 12/21/2018 11:48   Ct Code Stroke Cta Cerebral Perfusion W/wo Contrast  Result Date: 12/21/2018 CLINICAL DATA:  Left-sided weakness EXAM: CT ANGIOGRAPHY HEAD AND NECK CT PERFUSION BRAIN TECHNIQUE: Multidetector CT imaging of the head and neck was performed using the standard protocol during bolus administration of intravenous contrast. Multiplanar CT image reconstructions and MIPs were obtained to evaluate the vascular anatomy. Carotid stenosis measurements (when applicable) are obtained utilizing NASCET criteria, using the distal internal carotid diameter as the denominator. Multiphase CT imaging of the brain was performed following IV bolus contrast injection. Subsequent parametric perfusion maps were calculated using RAPID software. CONTRAST:  162mL OMNIPAQUE IOHEXOL 350 MG/ML SOLN COMPARISON:  Noncontrast head CT from earlier today FINDINGS: CTA NECK FINDINGS Aortic arch: Normal Right carotid system: Vessels are smooth and widely patent. No atheromatous changes. Left carotid system: Vessels are smooth and widely patent. No atheromatous changes. Vertebral arteries: No proximal subclavian stenosis. Scant atheromatous changes of the proximal left subclavian on coronal reformats. Both vertebral arteries are smooth and widely patent to the dura. Skeleton: Under pneumatized mastoids. There is partial left mastoid and middle ear opacification. Other neck: No evidence of mass or adenopathy. Upper chest: Negative Review of the  MIP images confirms the above findings CTA HEAD FINDINGS Anterior circulation: Vessels are smooth and widely patent. No aneurysm. Posterior circulation: Vessels are smooth and widely patent. Negative for aneurysm. Venous sinuses: Patent Anatomic variants: Fetal type left PCA Delayed phase: Not obtained Review of the MIP images confirms the above findings CT Brain Perfusion Findings: No abnormal perfusion to correlate with symptoms. Left fronto temporal highlighting on maps is attributed to motion artifact and does not correlate with left-sided weakness. IMPRESSION: 1. Negative CTA of head and neck. 2. Negative CT perfusion when allowing for motion artifact. Electronically Signed   By: Monte Fantasia M.D.   On: 12/21/2018 11:48   Ct Head Code Stroke Wo Contrast  Result Date: 12/21/2018 CLINICAL DATA:  Code stroke. Left-sided weakness. Last seen normal last night EXAM: CT HEAD WITHOUT CONTRAST TECHNIQUE: Contiguous axial images were obtained from the base of the skull through the vertex without intravenous contrast. COMPARISON:  05/22/2016 FINDINGS: Brain: No evidence of acute infarction, hemorrhage, hydrocephalus, extra-axial collection or mass lesion/mass effect. Vascular: No hyperdense vessel or unexpected calcification. Skull: Normal. Negative for fracture or focal lesion. Sinuses/Orbits: No acute finding. Other: These results were called by telephone at the time of interpretation on 12/21/2018 at 11:22 am to Dr. Noemi Chapel , who verbally acknowledged these results. ASPECTS [facility] Stroke Program Early CT Score) - Ganglionic level infarction (caudate, lentiform nuclei, internal capsule, insula, M1-M3 cortex): 7 - Supraganglionic infarction (M4-M6 cortex): 3 Total score (0-10 with 10 being normal): 10 IMPRESSION: Negative. ASPECTS is 10. Electronically Signed   By: Monte Fantasia M.D.   On: 12/21/2018 11:24    Pending Labs Unresulted Labs (From admission, onward)    Start     Ordered   12/21/18 1254   Culture, Urine  Add-on,   AD     12/21/18 1253   12/21/18 1232  VITAMIN D 25 Hydroxy (Vit-D Deficiency, Fractures)  Add-on,   AD     12/21/18 1231   12/21/18 1232  Hemoglobin A1c  Add-on,   AD     12/21/18 1231   Signed and Held  HIV antibody (Routine Testing)  Add-on,   R     Signed and Held   Signed and Held  Lipid panel  Tomorrow morning,   R  Comments: Fasting    Signed and Held   Signed and Held  CBC  (enoxaparin (LOVENOX)    CrCl >/= 30 ml/min)  Once,   R    Comments: Baseline for enoxaparin therapy IF NOT ALREADY DRAWN.  Notify MD if PLT < 100 K.    Signed and Held   Signed and Held  Creatinine, serum  (enoxaparin (LOVENOX)    CrCl >/= 30 ml/min)  Once,   R    Comments: Baseline for enoxaparin therapy IF NOT ALREADY DRAWN.    Signed and Held   Signed and Held  Creatinine, serum  (enoxaparin (LOVENOX)    CrCl >/= 30 ml/min)  Weekly,   R    Comments: while on enoxaparin therapy    Signed and Held   Signed and Held  CBC  Daily,   R     Signed and Held   Signed and Held  Comprehensive metabolic panel  Daily,   R     Signed and Held          Vitals/Pain Today's Vitals   12/21/18 1146 12/21/18 1200 12/21/18 1230 12/21/18 1300  BP: (!) 204/126 (!) 196/127 (!) 164/106 (!) 152/105  Pulse: 91 89 95   Resp: 19 15 15 15   Temp:      TempSrc:      SpO2: 98% 99% 96%   Weight:      Height:      PainSc:        Isolation Precautions No active isolations  Medications Medications  iohexol (OMNIPAQUE) 350 MG/ML injection 115 mL (115 mLs Intravenous Contrast Given 12/21/18 1126)  hydrALAZINE (APRESOLINE) injection 20 mg (20 mg Intravenous Given 12/21/18 1204)  aspirin chewable tablet 324 mg (324 mg Oral Given 12/21/18 1229)    Mobility non-ambulatory     Focused Assessments Neuro Assessment Handoff:  Swallow screen pass? Yes    NIH Stroke Scale ( + Modified Stroke Scale Criteria)  Interval: Initial Level of Consciousness (1a.)   : Alert, keenly responsive LOC  Questions (1b. )   +: Answers both questions correctly LOC Commands (1c. )   + : Performs both tasks correctly Best Gaze (2. )  +: Normal Visual (3. )  +: No visual loss Facial Palsy (4. )    : Normal symmetrical movements Motor Arm, Left (5a. )   +: Drift Motor Arm, Right (5b. )   +: No drift Motor Leg, Left (6a. )   +: Drift Motor Leg, Right (6b. )   +: No drift Limb Ataxia (7. ): Absent Sensory (8. )   +: Normal, no sensory loss Best Language (9. )   +: Mild-to-moderate aphasia Dysarthria (10. ): Normal Extinction/Inattention (11.)   +: No Abnormality Modified SS Total  +: 3 Complete NIHSS TOTAL: 2 Last date known well: 12/20/18 Last time known well: 2000 Neuro Assessment: Within Defined Limits Neuro Checks:   Initial (12/21/18 1117)  Last Documented NIHSS Modified Score: 3 (12/21/18 1208) Has TPA been given? No If patient is a Neuro Trauma and patient is going to OR before floor call report to [place] nurse: [phone] or [phone]     R Recommendations: See Admitting Provider Note  Report given to:   Additional Notes: Patient is not a candidate for tPA. Aphasia is resolving.

## 2018-12-21 NOTE — ED Notes (Signed)
Pt is not TPA candidate per Ria Comment RN with teleneuro.

## 2018-12-21 NOTE — ED Notes (Signed)
Report given to [facility] with [facility].

## 2018-12-21 NOTE — Progress Notes (Signed)
Pt admitted to the unit from [facility] ED; pt A&O x4; pt oriented to the unit and room; fall/safety precaution and prevention education completed with pt; telemetry applied and verified with CCMD: NT called to second verify; pt skin clean, dry and intact with no opened wounds or pressure ulcer noted; pt BP elevated on arrival but PRN adm effective; pt in bed with call light within reach; bed alarm on; reported off to oncoming RN. Francis Gaines Sarahann Horrell RN   12/21/18 1725  Vitals  Temp 98.5 F (36.9 C)  Temp Source Oral  BP (!) 175/119  MAP (mmHg) 130  BP Location Right Arm  BP Method Automatic  Patient Position (if appropriate) Lying  Pulse Rate 96  Pulse Rate Source Monitor  ECG Heart Rate 96  Resp 19  Oxygen Therapy  SpO2 98 %  O2 Device Room Air  Pain Assessment  Pain Scale 0-10  Pain Score 0  Level of Consciousness  Level of Consciousness Alert  MEWS Score  MEWS RR 0  MEWS Pulse 0  MEWS Systolic 0  MEWS LOC 0  MEWS Temp 0  MEWS Score 0  MEWS Score Color Green

## 2018-12-21 NOTE — Consult Note (Signed)
[facility] Tele[facility]urology Consult Services   Date of Service:   12/21/2018 11:40:01  Impression:     .  Right Hemispheric Infarct  Comments/Sign-Out: Patient is a 39 yo LH female with a PMH of HTN, OSA, anxiety, GERD who p/w left sided weakness, dysarthria and drowsiness present upon awakening this AM. LNK 23:00. On exam she has Left sided weakness, ataxia, LMN left facial weakness. Hypertensive on arrival. Symptoms suggestive of right pontine stroke. CTH, CTP and CTA head and neck negative. No LVO. NIR not indicated. Recommend admission for stroke w/u.  Mechanism of Stroke: Small Vessel Disease  Metrics: Last Known Well: 12/20/2018 23:00:00 [facility] Notification Time: 12/21/2018 11:40:01 Arrival Time: 12/21/2018 10:57:00 Stamp Time: 12/21/2018 11:40:01 Time First Login Attempt: 12/21/2018 11:44:46 Video Start Time: 12/21/2018 11:45:11  Symptoms: slurred speech, left sided weakness NIHSS Start Assessment Time: 12/21/2018 11:45:11 Patient is not a candidate for tPA. Patient was not deemed candidate for tPA thrombolytics because of Last Well Known Above 4.5 Hours. Video End Time: 12/21/2018 11:54:49  CT head showed no acute hemorrhage or acute core infarct.  Clinical Presentation is Suggestive of Large Vessel Occlusive Disease, Recommendations are as Follows  Reviewed, No Indication of Large Vessel Occlusive Thrombus, Patient is not an NIR Candidate.   ED Physician notified of diagnostic impression and management plan on 12/21/2018 11:55:11  Our recommendations are outlined below.  Recommendations:     .  Activate Stroke Protocol Admission/Order Set     .  Stroke/Telemetry Floor     .  Neuro Checks     .  Bedside Swallow Eval     .  DVT Prophylaxis     .  IV Fluids, Normal Saline     .  Head of Bed 30 Degrees     .  Euglycemia and Avoid Hyperthermia (PRN Acetaminophen)     .  STAT ASA 325mg  in ED     .  Rec permissive HTN up to 220/110 as  tolerated.  Routine Consultation with [facility] for Follow up Care  Sign Out:     .  Discussed with Emergency Department Provider    ------------------------------------------------------------------------------  History of Present Illness: Patient is a 38 year old Female.  Patient was brought by private transportation with symptoms of slurred speech, left sided weakness  Patient is a 38 yo LH female with a PMH of HTN, OSA, anxiety, GERD who p/w left sided weakness, dysarthria and drowsiness present upon awakening this AM. LNK 23:00. She went to sleep at 11pm, taking 2 xanax per usual. When she awoke at [place] she was unable to walk, having to hold on to the wall. She also felt tired with slurring of her speech. No h/o stroke, DVTs, miscarriages.  Last seen normal was beyond 4.5 hours of presentation. There is no history of hemorrhagic complications or intracranial hemorrhage. There is no history of Recent Anticoagulants. There is no history of recent major surgery. There is no history of recent stroke.  Examination: BP(204/126), 1A: Level of Consciousness - Arouses to minor stimulation + 1 1B: Ask Month and Age - Both Questions Right + 0 1C: Blink Eyes & Squeeze Hands - Performs Both Tasks + 0 2: Test Horizontal Extraocular Movements - Normal + 0 3: Test Visual Fields - No Visual Loss + 0 4: Test Facial Palsy (Use Grimace if Obtunded) - Unilateral Complete paralysis (upper/lower face) + 3 5A: Test Left Arm Motor Drift - Drift, but doesn't hit bed + 1 5B: Test Right Arm  Motor Drift - No Drift for 10 Seconds + 0 6A: Test Left Leg Motor Drift - Drift, hits bed + 2 6B: Test Right Leg Motor Drift - No Drift for 5 Seconds + 0 7: Test Limb Ataxia (FNF/Heel-Shin) - Ataxia in 2 Limbs + 2 8: Test Sensation - Normal; No sensory loss + 0 9: Test Language/Aphasia - Normal; No aphasia + 0 10: Test Dysarthria - Severe Dysarthria: Unintelligble Slurring or Out of Proportion to Aphasia +  2 11: Test Extinction/Inattention - No abnormality + 0  NIHSS Score: 11  Patient/Family was informed the Neurology Consult would happen via TeleHealth consult by way of interactive audio and video telecommunications and consented to receiving care in this manner.  Due to the immediate potential for life-threatening deterioration due to underlying acute neurologic illness, I spent 35 minutes providing critical care. This time includes time for face to face visit via telemedicine, review of medical records, imaging studies and discussion of findings with providers, the patient and/or family.   Dr Ruffin Frederick   [facility] [phone]  Case [id number]

## 2018-12-21 NOTE — Consult Note (Addendum)
NEUROLOGY CONSULT  Reason for Consult:Neurologic evaluation of stroke Referring Physician: [facility]: Dr Wynetta Emery  CC: Left side weakness  HPI: Maria Lang is an 38 y.o. female with anxiety, OSA, HTN, HLD, GERD, current tobacco use, presented to [facility] with left side weakness. She tells me she went to bed normal last night ~11pm and awoke around 05:45 this am with left side weakness to where she had difficulty walking. She thought she needed to rest more and went back to sleep on her couch till rest of family awoke to find her at 0900 with slurred speech and notable left side weakness. She tells me she did not look in mirror to see if she had a facial droop with this.  Evaluation at [facility] by telestroke recorded NIHSS 11, including facial palsy. BP was 204/126. CTA h/a showed no LVO. No tPA given as she is outside of time window. She was transferred to [facility] hospital for MRI brain and further neurologic care. She has slowly improved over the course of the day and now only has mild dysarthria and left arm drift.   Past Medical History Past Medical History:  Diagnosis Date  . Anxiety   . Deviated septum   . GERD (gastroesophageal reflux disease)   . History of bilateral salpingectomy   . Hypertension   . Renal stones   . Sleep apnea    Doesn't use everyday    Past Surgical History Past Surgical History:  Procedure Laterality Date  . BILATERAL SALPINGECTOMY  07.2016  . CYSTOSCOPY WITH RETROGRADE PYELOGRAM, URETEROSCOPY AND STENT PLACEMENT Right 07/29/2015   Procedure: CYSTOSCOPY WITH RIGHT RETROGRADE PYELOGRAM, RIGHT URETEROSCOPY AND STENT PLACEMENT;  Surgeon: Irine Seal, MD;  Location: [facility];  Service: Urology;  Laterality: Right;  . ECTOPIC PREGNANCY SURGERY  10/2013  . HOLMIUM LASER APPLICATION Right 09/30/5679   Procedure: HOLMIUM LASER APPLICATION;  Surgeon: Irine Seal, MD;  Location: [facility];  Service: Urology;  Laterality: Right;  . NASAL SEPTUM SURGERY     doen along with tonsillectomy   .  TONSILLECTOMY    . TYMPANOSTOMY     x 3    Family History Family History  Problem Relation Age of Onset  . Hypertension Mother   . Hypertension Father   . Hyperlipidemia Father   . Diabetes Brother   . Diabetes Other     Social History    reports that she has been smoking cigarettes. She started smoking about 21 years ago. She has a 3.50 pack-year smoking history. She has never used smokeless tobacco. She reports that she does not drink alcohol or use drugs.  Allergies Allergies  Allergen Reactions  . Hydrocodeine [Dihydrocodeine] Shortness Of Breath and Nausea And Vomiting  . Hydrocodone-Acetaminophen Shortness Of Breath and Nausea Only  . Coreg [Carvedilol]     Headache   . Labetalol Other (See Comments)    Head itches    Home Medications Medications Prior to Admission  Medication Sig Dispense Refill  . ALPRAZolam (XANAX) 1 MG tablet Take 0.5-1 tablets by mouth 3 (three) times daily as needed for anxiety.  1  . amLODipine (NORVASC) 10 MG tablet Take 1 tablet (10 mg total) by mouth daily. 30 tablet 0  . fluticasone (FLONASE) 50 MCG/ACT nasal spray Place 2 sprays into both nostrils daily as needed.    . hydrocortisone (PROCTO-MED HC) 2.5 % rectal cream Place 1 application rectally daily as needed.    [name] Kitchen levothyroxine (SYNTHROID, LEVOTHROID) 50 MCG tablet TAKE ONE TABLET BY MOUTH  EVERY MORNING ON AN EMPTY STOMACH    . losartan-hydrochlorothiazide (HYZAAR) 50-12.5 MG tablet TAKE ONE TABLET BY MOUTH ONCE DAILY    . ondansetron (ZOFRAN ODT) 4 MG disintegrating tablet Take 1 tablet (4 mg total) by mouth every 6 (six) hours as needed for nausea or vomiting. 120 tablet 2  . pantoprazole (PROTONIX) 40 MG tablet TAKE 1 TABLET BY MOUTH TWICE DAILY BEFORE BREAKFAST AND DINNER 180 tablet 0  . propranolol ER (INDERAL LA) 60 MG 24 hr capsule Take 2 capsules by mouth daily.    . sertraline (ZOLOFT) 100 MG tablet Take 150 mg by mouth daily.    Derrill Memo ON 01/10/2019] varenicline (CHANTIX) 1  MG tablet Take 1 tablet by mouth 2 (two) times a day.      Hospital Medications . [START ON 12/22/2018] aspirin  300 mg Rectal Daily   Or  . [START ON 12/22/2018] aspirin  325 mg Oral Daily  . atorvastatin  80 mg Oral q1800  . enoxaparin (LOVENOX) injection  40 mg Subcutaneous Q24H  . [START ON 12/22/2018] levothyroxine  50 mcg Oral Q0600  . nicotine  21 mg Transdermal Daily  . pantoprazole  40 mg Oral BID AC  . [START ON 12/22/2018] sertraline  150 mg Oral Daily     ROS: History obtained from pt  General ROS: negative for - chills, fatigue, fever, night sweats, weight gain or weight loss Psychological ROS: negative for - behavioral disorder, hallucinations, memory difficulties, mood swings or suicidal ideation Ophthalmic ROS: negative for - blurry vision, double vision, eye pain or loss of vision ENT ROS: negative for - epistaxis, nasal discharge, oral lesions, sore throat, tinnitus or vertigo Allergy and Immunology ROS: negative for - hives or itchy/watery eyes Hematological and Lymphatic ROS: negative for - bleeding problems, bruising or swollen lymph nodes Endocrine ROS: negative for - galactorrhea, hair pattern changes, polydipsia/polyuria or temperature intolerance Respiratory ROS: negative for - cough, hemoptysis, shortness of breath or wheezing Cardiovascular ROS: negative for - chest pain, dyspnea on exertion, edema or irregular heartbeat Gastrointestinal ROS: negative for - abdominal pain, diarrhea, hematemesis, nausea/vomiting or stool incontinence Genito-Urinary ROS: negative for - dysuria, hematuria, incontinence or urinary frequency/urgency Musculoskeletal ROS: negative for - joint swelling or muscular weakness Neurological ROS: as noted in HPI Dermatological ROS: negative for rash and skin lesion changes   Physical Examination: Vitals:   12/21/18 1500 12/21/18 1530 12/21/18 1600 12/21/18 1725  BP: (!) 145/102 (!) 166/101 (!) 150/109 (!) 175/119  Pulse: 96 88 93 96   Resp: 15 15 12 19   Temp:    98.5 F (36.9 C)  TempSrc:    Oral  SpO2: 95% 99% 98% 98%  Weight:      Height:        General - overweight, no acute distress Heart - Regular rate and rhythm - no murmer Lungs - Clear to auscultation Abdomen - Soft - non tender Extremities - Distal pulses intact - no edema Skin - Warm and dry  Neurologic Examination:   Mental Status:  Alert, oriented, thought content appropriate. Speech without evidence of anomia or aphasia. Able to follow 3 step commands without difficulty. There is very mild dysarthria noted.  Cranial Nerves:  II-bilateral visual fields intact III/IV/VI-Pupils were equal and reacted. Extraocular movements were full.  V/VII-no facial numbness and no facial weakness.  VIII-hearing normal.  X-normal speech and symmetrical palatal movement.  XII-midline tongue extension  Motor: Right : Upper extremity   5/5  Left:  Upper extremity   5/5 with slight pronator drift  Lower extremity   5/5   Lower extremity   5/5; No drift Tone and bulk:normal tone throughout; no atrophy noted Sensory: Intact to light touch in all extremities. Deep Tendon Reflexes: 2/4 throughout Plantars: Downgoing bilaterally  Cerebellar: Normal finger to nose and heel to shin bilaterally. Gait: not tested  NIHSS 1a Level of Conscious:0 1b LOC Questions: 0 1c LOC Commands: 0 2 Best Gaze: 0 3 Visual: 0 4 Facial Palsy: 0 5a Motor Arm - left: 1 5b Motor Arm - Right: 0 6a Motor Leg - Left: 0 6b Motor Leg - Right: 0 7 Limb Ataxia: 0 8 Sensory: 0 9 Best Language: 0 10 Dysarthria:1 11 Extinct. and Inattention:0 TOTAL: 2   LABORATORY STUDIES:  Basic Metabolic Panel: Recent Labs  Lab 12/21/18 1121  NA 139  K 4.0  CL 103  CO2 25  GLUCOSE 129*  BUN 14  CREATININE 0.71  CALCIUM 9.8    Liver Function Tests: Recent Labs  Lab 12/21/18 1121  AST 13*  ALT 15  ALKPHOS 66  BILITOT 0.3  PROT 8.1  ALBUMIN 4.6   No results for input(s):  LIPASE, AMYLASE in the last 168 hours. No results for input(s): AMMONIA in the last 168 hours.  CBC: Recent Labs  Lab 12/21/18 1121  WBC 9.7  NEUTROABS 6.8  HGB 11.8*  HCT 38.5  MCV 88.3  PLT 305    Cardiac Enzymes: No results for input(s): CKTOTAL, CKMB, CKMBINDEX, TROPONINI in the last 168 hours.  BNP: Invalid input(s): POCBNP  CBG: No results for input(s): GLUCAP in the last 168 hours.  Microbiology:   Coagulation Studies: Recent Labs    12/21/18 1121  LABPROT 13.1  INR 1.0    Urinalysis:  Recent Labs  Lab 12/21/18 1217  COLORURINE YELLOW  LABSPEC 1.029  PHURINE 7.0  GLUCOSEU NEGATIVE  HGBUR NEGATIVE  BILIRUBINUR NEGATIVE  KETONESUR NEGATIVE  PROTEINUR NEGATIVE  NITRITE NEGATIVE  LEUKOCYTESUR MODERATE*    Lipid Panel:     Component Value Date/Time   CHOL 235 (H) 12/21/2018 1121   TRIG 298 (H) 12/21/2018 1121   HDL 29 (L) 12/21/2018 1121   CHOLHDL 8.1 12/21/2018 1121   VLDL 60 (H) 12/21/2018 1121   LDLCALC 146 (H) 12/21/2018 1121    HgbA1C:  No results found for: HGBA1C  Urine Drug Screen:      Component Value Date/Time   LABOPIA NONE DETECTED 12/21/2018 1217   COCAINSCRNUR NONE DETECTED 12/21/2018 1217   LABBENZ POSITIVE (A) 12/21/2018 1217   AMPHETMU NONE DETECTED 12/21/2018 1217   THCU NONE DETECTED 12/21/2018 1217   LABBARB NONE DETECTED 12/21/2018 1217     Alcohol Level:  Recent Labs  Lab 12/21/18 1121  ETH <10    Miscellaneous labs:  EKG  EKG  IMAGING: Ct Code Stroke Cta Head W/wo Contrast  Result Date: 12/21/2018 CLINICAL DATA:  Left-sided weakness EXAM: CT ANGIOGRAPHY HEAD AND NECK CT PERFUSION BRAIN TECHNIQUE: Multidetector CT imaging of the head and neck was performed using the standard protocol during bolus administration of intravenous contrast. Multiplanar CT image reconstructions and MIPs were obtained to evaluate the vascular anatomy. Carotid stenosis measurements (when applicable) are obtained utilizing  NASCET criteria, using the distal internal carotid diameter as the denominator. Multiphase CT imaging of the brain was performed following IV bolus contrast injection. Subsequent parametric perfusion maps were calculated using RAPID software. CONTRAST:  146mL OMNIPAQUE IOHEXOL 350 MG/ML SOLN COMPARISON:  Noncontrast head  CT from earlier today FINDINGS: CTA NECK FINDINGS Aortic arch: Normal Right carotid system: Vessels are smooth and widely patent. No atheromatous changes. Left carotid system: Vessels are smooth and widely patent. No atheromatous changes. Vertebral arteries: No proximal subclavian stenosis. Scant atheromatous changes of the proximal left subclavian on coronal reformats. Both vertebral arteries are smooth and widely patent to the dura. Skeleton: Under pneumatized mastoids. There is partial left mastoid and middle ear opacification. Other neck: No evidence of mass or adenopathy. Upper chest: Negative Review of the MIP images confirms the above findings CTA HEAD FINDINGS Anterior circulation: Vessels are smooth and widely patent. No aneurysm. Posterior circulation: Vessels are smooth and widely patent. Negative for aneurysm. Venous sinuses: Patent Anatomic variants: Fetal type left PCA Delayed phase: Not obtained Review of the MIP images confirms the above findings CT Brain Perfusion Findings: No abnormal perfusion to correlate with symptoms. Left fronto temporal highlighting on maps is attributed to motion artifact and does not correlate with left-sided weakness. IMPRESSION: 1. Negative CTA of head and neck. 2. Negative CT perfusion when allowing for motion artifact. Electronically Signed   By: Monte Fantasia M.D.   On: 12/21/2018 11:48   Ct Code Stroke Cta Neck W/wo Contrast  Result Date: 12/21/2018 CLINICAL DATA:  Left-sided weakness EXAM: CT ANGIOGRAPHY HEAD AND NECK CT PERFUSION BRAIN TECHNIQUE: Multidetector CT imaging of the head and neck was performed using the standard protocol during  bolus administration of intravenous contrast. Multiplanar CT image reconstructions and MIPs were obtained to evaluate the vascular anatomy. Carotid stenosis measurements (when applicable) are obtained utilizing NASCET criteria, using the distal internal carotid diameter as the denominator. Multiphase CT imaging of the brain was performed following IV bolus contrast injection. Subsequent parametric perfusion maps were calculated using RAPID software. CONTRAST:  162mL OMNIPAQUE IOHEXOL 350 MG/ML SOLN COMPARISON:  Noncontrast head CT from earlier today FINDINGS: CTA NECK FINDINGS Aortic arch: Normal Right carotid system: Vessels are smooth and widely patent. No atheromatous changes. Left carotid system: Vessels are smooth and widely patent. No atheromatous changes. Vertebral arteries: No proximal subclavian stenosis. Scant atheromatous changes of the proximal left subclavian on coronal reformats. Both vertebral arteries are smooth and widely patent to the dura. Skeleton: Under pneumatized mastoids. There is partial left mastoid and middle ear opacification. Other neck: No evidence of mass or adenopathy. Upper chest: Negative Review of the MIP images confirms the above findings CTA HEAD FINDINGS Anterior circulation: Vessels are smooth and widely patent. No aneurysm. Posterior circulation: Vessels are smooth and widely patent. Negative for aneurysm. Venous sinuses: Patent Anatomic variants: Fetal type left PCA Delayed phase: Not obtained Review of the MIP images confirms the above findings CT Brain Perfusion Findings: No abnormal perfusion to correlate with symptoms. Left fronto temporal highlighting on maps is attributed to motion artifact and does not correlate with left-sided weakness. IMPRESSION: 1. Negative CTA of head and neck. 2. Negative CT perfusion when allowing for motion artifact. Electronically Signed   By: Monte Fantasia M.D.   On: 12/21/2018 11:48   Ct Code Stroke Cta Cerebral Perfusion W/wo  Contrast  Result Date: 12/21/2018 CLINICAL DATA:  Left-sided weakness EXAM: CT ANGIOGRAPHY HEAD AND NECK CT PERFUSION BRAIN TECHNIQUE: Multidetector CT imaging of the head and neck was performed using the standard protocol during bolus administration of intravenous contrast. Multiplanar CT image reconstructions and MIPs were obtained to evaluate the vascular anatomy. Carotid stenosis measurements (when applicable) are obtained utilizing NASCET criteria, using the distal internal carotid diameter as the  denominator. Multiphase CT imaging of the brain was performed following IV bolus contrast injection. Subsequent parametric perfusion maps were calculated using RAPID software. CONTRAST:  191mL OMNIPAQUE IOHEXOL 350 MG/ML SOLN COMPARISON:  Noncontrast head CT from earlier today FINDINGS: CTA NECK FINDINGS Aortic arch: Normal Right carotid system: Vessels are smooth and widely patent. No atheromatous changes. Left carotid system: Vessels are smooth and widely patent. No atheromatous changes. Vertebral arteries: No proximal subclavian stenosis. Scant atheromatous changes of the proximal left subclavian on coronal reformats. Both vertebral arteries are smooth and widely patent to the dura. Skeleton: Under pneumatized mastoids. There is partial left mastoid and middle ear opacification. Other neck: No evidence of mass or adenopathy. Upper chest: Negative Review of the MIP images confirms the above findings CTA HEAD FINDINGS Anterior circulation: Vessels are smooth and widely patent. No aneurysm. Posterior circulation: Vessels are smooth and widely patent. Negative for aneurysm. Venous sinuses: Patent Anatomic variants: Fetal type left PCA Delayed phase: Not obtained Review of the MIP images confirms the above findings CT Brain Perfusion Findings: No abnormal perfusion to correlate with symptoms. Left fronto temporal highlighting on maps is attributed to motion artifact and does not correlate with left-sided weakness.  IMPRESSION: 1. Negative CTA of head and neck. 2. Negative CT perfusion when allowing for motion artifact. Electronically Signed   By: Monte Fantasia M.D.   On: 12/21/2018 11:48   Ct Head Code Stroke Wo Contrast  Result Date: 12/21/2018 CLINICAL DATA:  Code stroke. Left-sided weakness. Last seen normal last night EXAM: CT HEAD WITHOUT CONTRAST TECHNIQUE: Contiguous axial images were obtained from the base of the skull through the vertex without intravenous contrast. COMPARISON:  05/22/2016 FINDINGS: Brain: No evidence of acute infarction, hemorrhage, hydrocephalus, extra-axial collection or mass lesion/mass effect. Vascular: No hyperdense vessel or unexpected calcification. Skull: Normal. Negative for fracture or focal lesion. Sinuses/Orbits: No acute finding. Other: These results were called by telephone at the time of interpretation on 12/21/2018 at 11:22 am to Dr. Noemi Chapel , who verbally acknowledged these results. ASPECTS [facility] Stroke Program Early CT Score) - Ganglionic level infarction (caudate, lentiform nuclei, internal capsule, insula, M1-M3 cortex): 7 - Supraganglionic infarction (M4-M6 cortex): 3 Total score (0-10 with 10 being normal): 10 IMPRESSION: Negative. ASPECTS is 10. Electronically Signed   By: Monte Fantasia M.D.   On: 12/21/2018 11:24     Assessment/Plan: This is a 38yr old lady with multiple stroke risks such as obesity, OSA, active tobacco smoker, HTN, HLD. No formal dx of DM2, but strong family hx and elevated glucose. A1c pending. She awoke with left side symptoms and slurred speech on 6/27. CTA showed no LVO. CTP showed no deficit. No carotid disease. Presenting NIHSS 11 per telestroke services.   # Acute stroke like symptoms- most likely small vessel stroke. ASA started, may need DAPT pending wk up. MRI pending to better evaluate # Hypertensive Emergency- presented with BP 204/126. Says she is compliant with home BP meds. Permissive HTN (SBP to 220) ok for now, but  monitor carefully and if MRI neg, need goal to be normotensive # HLD- LDL is 146, goal <70.  Hyperglycemia- A1c pending. No prev dx of DM2, but strong family hx # Obesity and OSA- counseled. Will need CPAP QHS. # Current tobacco smoker- counseled on quitting  Recs: MRI brain pending, further recs pending read ASA 325mg  daily started High intensity statin started Echo A1c in am Rehab evals Fall precautions  Attending neurologist's note to follow Desiree Metzger-Cihelka, ARNP-C, ANVP-BC  Pager: [phone]   NEUROHOSPITALIST ADDENDUM Performed a face to face diagnostic evaluation.   I have reviewed the contents of history and physical exam as documented by PA/ARNP/Resident and agree with above documentation.  I have discussed and formulated the above plan as documented. Edits to the note have been made as needed.  38 year old young female patient with history of hypertension with the longest standing uncontrolled blood pressure (on average systolic is in the 757V according to the patient) presents with left-sided weakness and slurred speech.  CT head  fortunately negative for hemorrhage.  Patient was transferred for stroke work-up.  On assessment patient states her symptoms improved and she has mild left hemiparesis, facial droop and slurred speech.  Given her baseline high blood pressure and arrival in the systolics of greater than 200s, will keep blood pressure in the upper limits of permissive hypertension that is 220 /120 mmHg.  MRI brain and CT angiogram and echocardiogram.  We will need to gradually reduce blood pressure to normotension after 24 hours.  Will start patient only on aspirin and then dual antiplatelet therapy after 24-hour mark when we are able to bring her blood pressure down.  I am hesitant to start dual antiplatelet therapy given her high blood pressure at this time.    Karena Addison Aroor MD [facility] [phone]   If 7pm to 7am, please call on call  as listed on AMION.

## 2018-12-21 NOTE — Progress Notes (Signed)
CODE STROKE   1103  Call time 1104 Beeper time 1108 exam started 1112  Exam finished,images to [facility],  [facility] Radiology.   Delay in completing exam due to Radiologist already locked on exam and in chart.

## 2018-12-21 NOTE — Progress Notes (Signed)
Pt states she does not want to wear CPAP tonight.

## 2018-12-22 ENCOUNTER — Inpatient Hospital Stay (HOSPITAL_COMMUNITY): Payer: BC Managed Care – PPO

## 2018-12-22 DIAGNOSIS — I639 Cerebral infarction, unspecified: Secondary | ICD-10-CM

## 2018-12-22 DIAGNOSIS — G4733 Obstructive sleep apnea (adult) (pediatric): Secondary | ICD-10-CM

## 2018-12-22 LAB — HEMOGLOBIN A1C
Hgb A1c MFr Bld: 5.3 % (ref 4.8–5.6)
Mean Plasma Glucose: 105.41 mg/dL

## 2018-12-22 LAB — COMPREHENSIVE METABOLIC PANEL
ALT: 15 U/L (ref 0–44)
AST: 12 U/L — ABNORMAL LOW (ref 15–41)
Albumin: 3.5 g/dL (ref 3.5–5.0)
Alkaline Phosphatase: 52 U/L (ref 38–126)
Anion gap: 12 (ref 5–15)
BUN: 12 mg/dL (ref 6–20)
CO2: 20 mmol/L — ABNORMAL LOW (ref 22–32)
Calcium: 8.9 mg/dL (ref 8.9–10.3)
Chloride: 105 mmol/L (ref 98–111)
Creatinine, Ser: 0.79 mg/dL (ref 0.44–1.00)
GFR calc Af Amer: >60 mL/min (ref >60)
GFR calc non Af Amer: >60 mL/min (ref >60)
Glucose, Bld: 101 mg/dL — ABNORMAL HIGH (ref 70–99)
Potassium: 3.3 mmol/L — ABNORMAL LOW (ref 3.5–5.1)
Sodium: 137 mmol/L (ref 135–145)
Total Bilirubin: 0.6 mg/dL (ref 0.3–1.2)
Total Protein: 6.5 g/dL (ref 6.5–8.1)

## 2018-12-22 LAB — CBC
HCT: 33.3 % — ABNORMAL LOW (ref 36.0–46.0)
Hemoglobin: 10.5 g/dL — ABNORMAL LOW (ref 12.0–15.0)
MCH: 26.6 pg (ref 26.0–34.0)
MCHC: 31.5 g/dL (ref 30.0–36.0)
MCV: 84.5 fL (ref 80.0–100.0)
Platelets: 291 10*3/uL (ref 150–400)
RBC: 3.94 MIL/uL (ref 3.87–5.11)
RDW: 14.8 % (ref 11.5–15.5)
WBC: 10.6 10*3/uL — ABNORMAL HIGH (ref 4.0–10.5)
nRBC: 0 % (ref 0.0–0.2)

## 2018-12-22 LAB — ECHOCARDIOGRAM COMPLETE
Height: 65 in
Weight: 2880 oz

## 2018-12-22 LAB — HIV ANTIBODY (ROUTINE TESTING W REFLEX): HIV Screen 4th Generation wRfx: NONREACTIVE

## 2018-12-22 IMAGING — MR MRI HEAD WITHOUT CONTRAST
12 of 13 series · 43 of 48 positions shown · non-contrast
Comparison: CTA head neck [DATE]

CLINICAL DATA: Slurred speech and left-sided weakness

EXAM:
MRI HEAD WITHOUT CONTRAST
TECHNIQUE: Multiplanar, multiecho pulse sequences of the brain and surrounding
structures were obtained without intravenous contrast.

[Series 5: DWI · axial · 3.0mm · 0.88mm/px · z∈[-53,+70]mm · 6 of 84 slices shown (1 of 4)]
[im 1/84]
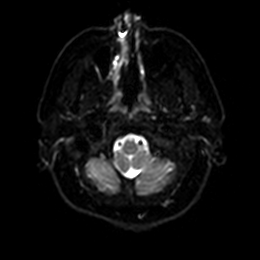
[im 17/84]
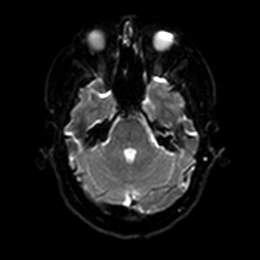
[im 34/84]
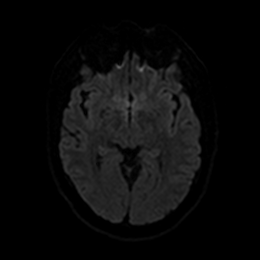
[im 50/84]
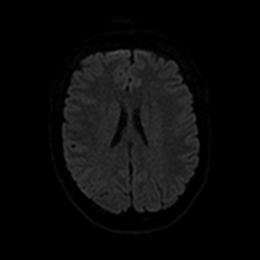
[im 67/84]
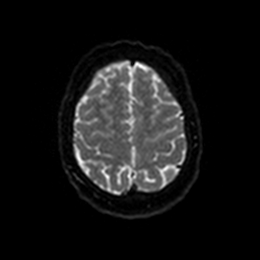
[im 84/84]
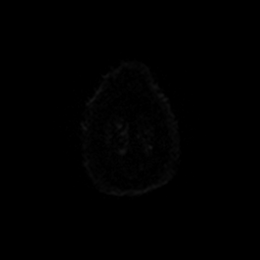

[Series 6: DWI · axial · 3.0mm · 0.88mm/px · z∈[-53,+70]mm · 3 of 42 slices shown (2 of 4)]
[im 1/42]
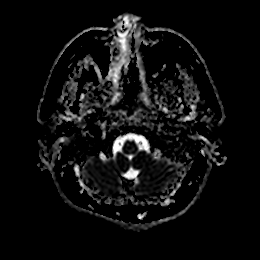
[im 21/42]
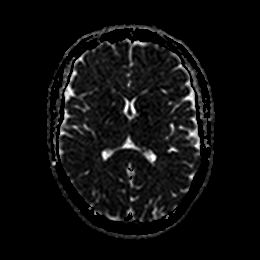
[im 42/42]
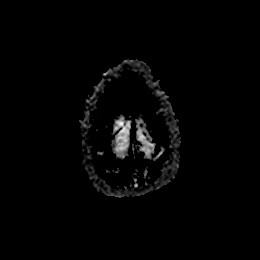

[Series 7: DWI · coronal · 4.0mm · 0.88mm/px · 5 of 64 slices shown (3 of 4)]
[im 1/64]
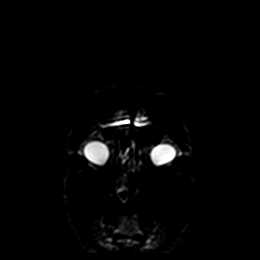
[im 16/64]
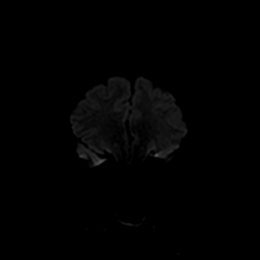
[im 32/64]
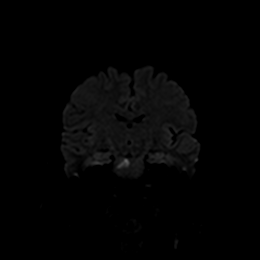
[im 48/64]
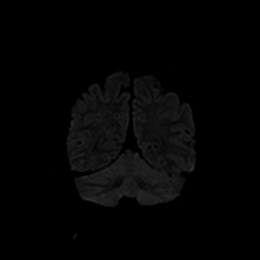
[im 64/64]
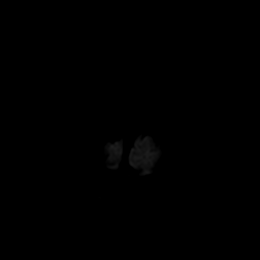

[Series 8: DWI · coronal · 4.0mm · 0.88mm/px · 3 of 32 slices shown (4 of 4)]
[im 1/32]
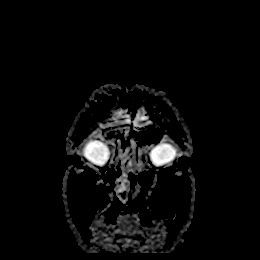
[im 16/32]
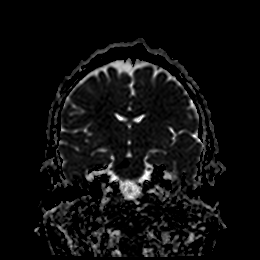
[im 32/32]
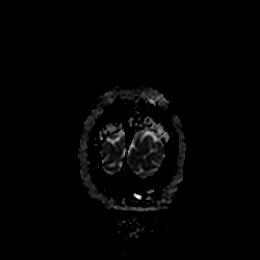

[Series 9: T1 · sagittal · 5.0mm · 0.94mm/px · 2 of 23 slices shown]
[im 1/23]
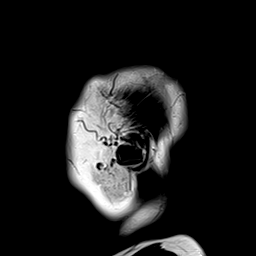
[im 23/23]
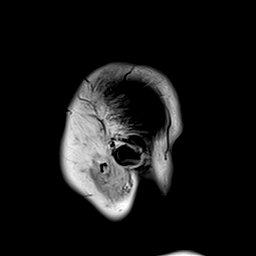

[Series 10: T2 · axial · 5.0mm · 0.72mm/px · z∈[-63,+80]mm · 2 of 25 slices shown (1 of 2)]
[im 1/25]
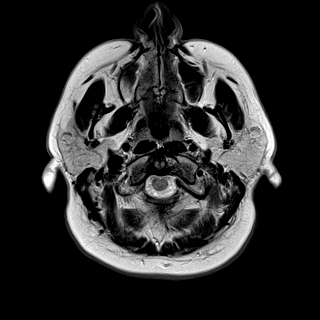
[im 25/25]
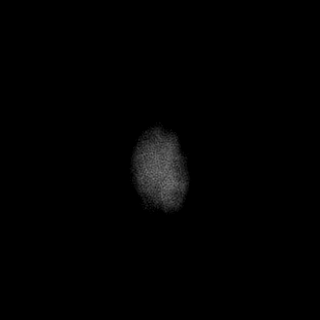

[Series 11: FLAIR · axial · 5.0mm · 0.45mm/px · z∈[-65,+79]mm · 2 of 25 slices shown]
[im 1/25]
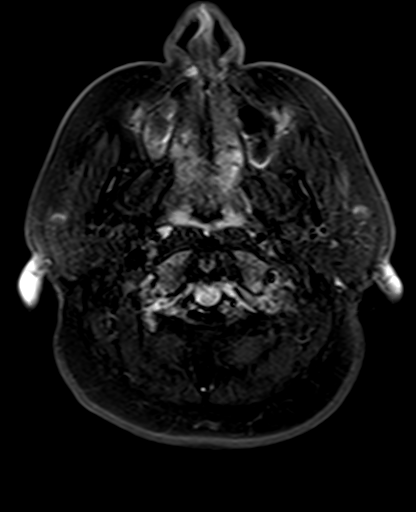
[im 25/25]
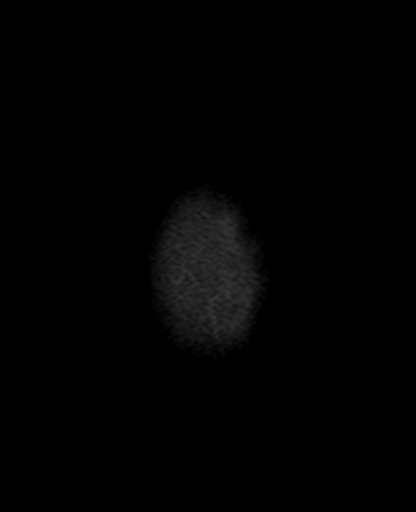

[Series 12: mag_images · axial · 3.0mm · 0.90mm/px · z∈[-75,+89]mm · 5 of 56 slices shown]
[im 1/56]
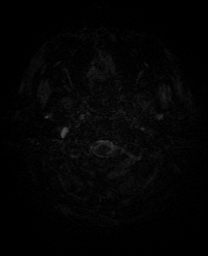
[im 14/56]
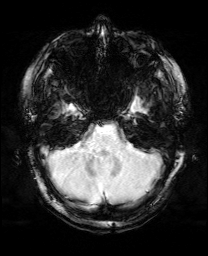
[im 28/56]
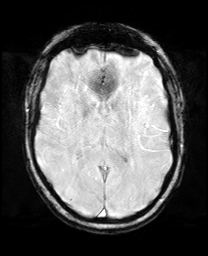
[im 42/56]
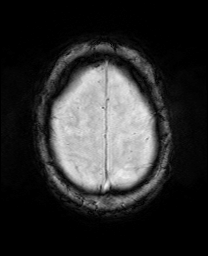
[im 56/56]
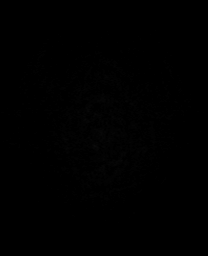

[Series 13: pha_images · axial · 3.0mm · 0.90mm/px · z∈[-75,+86]mm · 4 of 54 slices shown]
[im 1/54]
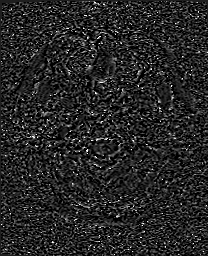
[im 18/54]
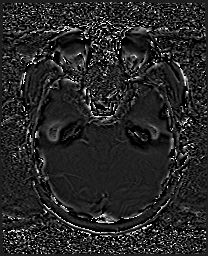
[im 36/54]
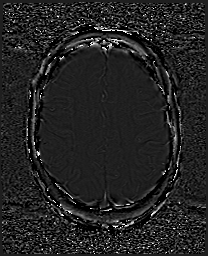
[im 54/54]
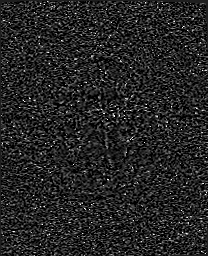

[Series 14: swi_images · axial · 3.0mm · 0.90mm/px · z∈[-75,+89]mm · 5 of 56 slices shown]
[im 1/56]
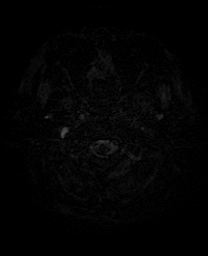
[im 14/56]
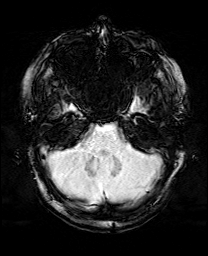
[im 28/56]
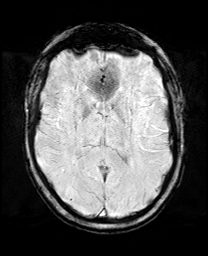
[im 42/56]
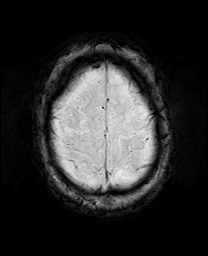
[im 56/56]
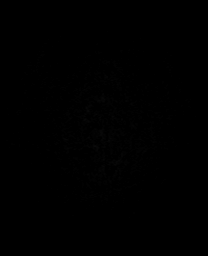

[Series 15: mip_images(sw) · axial · 24.0mm · 0.90mm/px · z∈[-65,+79]mm · 4 of 49 slices shown]
[im 1/49]
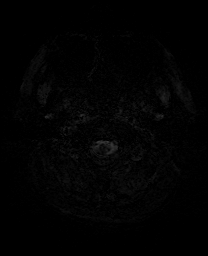
[im 17/49]
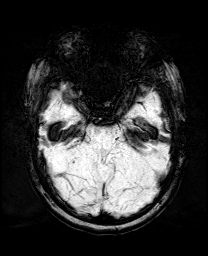
[im 33/49]
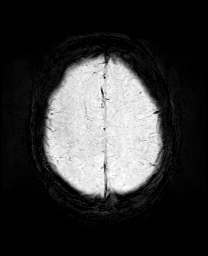
[im 49/49]
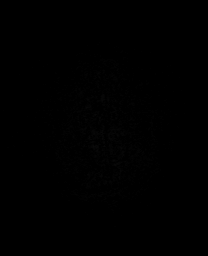

[Series 17: T2 · coronal · 5.0mm · 0.34mm/px · 2 of 29 slices shown (2 of 2)]
[im 1/29]
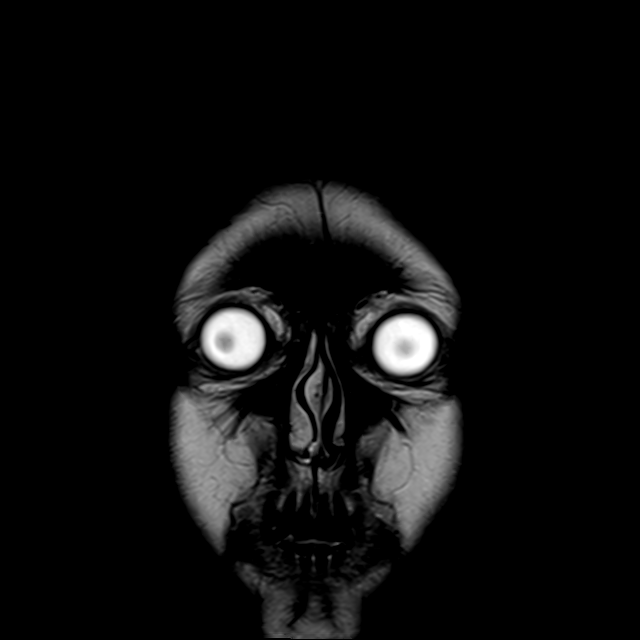
[im 29/29]
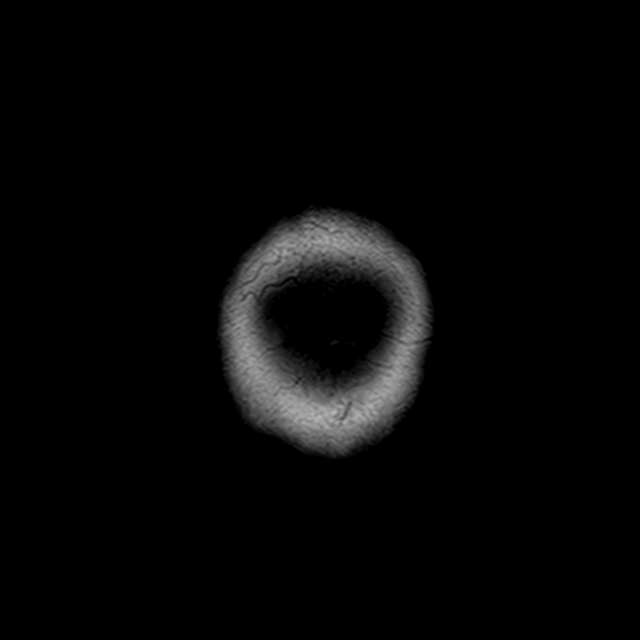

[43 of 48 positions shown; findings below may reference images not displayed]

FINDINGS: BRAIN: There is an area abnormal diffusion restriction within the
ventral right pons. The area measures series approximately 1.6 x
cm. The midline structures are normal. The white matter signal is
normal for the patient's age. The cerebral and cerebellar volume are
age-appropriate. No hydrocephalus. Susceptibility-sensitive
sequences show no chronic microhemorrhage or superficial siderosis.
No midline shift or other mass effect.

VASCULAR: The major intracranial arterial and venous sinus flow
voids are normal.

SKULL AND UPPER CERVICAL SPINE: Calvarial bone marrow signal is
normal. There is no skull base mass. Visualized upper cervical spine
and soft tissues are normal.

SINUSES/ORBITS: No fluid levels or advanced mucosal thickening. No
mastoid or middle ear effusion. The orbits are normal.
IMPRESSION: 1. Acute infarct of the ventral right pons, in keeping with the
reported left-sided weakness.
2. No hemorrhage or mass effect.

## 2018-12-22 MED ORDER — ATORVASTATIN CALCIUM 40 MG PO TABS
40.0000 mg | ORAL_TABLET | Freq: Every day | ORAL | Status: DC
  Administered 2018-12-22: 40 mg via ORAL
  Filled 2018-12-22: qty 1

## 2018-12-22 MED ORDER — ASPIRIN EC 81 MG PO TBEC
81.0000 mg | DELAYED_RELEASE_TABLET | Freq: Every day | ORAL | Status: DC
  Administered 2018-12-22 – 2018-12-23 (×2): 81 mg via ORAL
  Filled 2018-12-22 (×2): qty 1

## 2018-12-22 MED ORDER — CLOPIDOGREL BISULFATE 75 MG PO TABS
75.0000 mg | ORAL_TABLET | Freq: Every day | ORAL | Status: DC
  Administered 2018-12-22 – 2018-12-23 (×2): 75 mg via ORAL
  Filled 2018-12-22 (×2): qty 1

## 2018-12-22 MED ORDER — POTASSIUM CHLORIDE CRYS ER 20 MEQ PO TBCR
20.0000 meq | EXTENDED_RELEASE_TABLET | Freq: Three times a day (TID) | ORAL | Status: AC
  Administered 2018-12-22 (×3): 20 meq via ORAL
  Filled 2018-12-22 (×3): qty 1

## 2018-12-22 MED ORDER — ONDANSETRON HCL 4 MG/2ML IJ SOLN
4.0000 mg | Freq: Four times a day (QID) | INTRAMUSCULAR | Status: DC
  Administered 2018-12-22 – 2018-12-23 (×3): 4 mg via INTRAVENOUS
  Filled 2018-12-22 (×3): qty 2

## 2018-12-22 MED ORDER — AMLODIPINE BESYLATE 10 MG PO TABS
10.0000 mg | ORAL_TABLET | Freq: Every day | ORAL | Status: DC
  Administered 2018-12-22 – 2018-12-23 (×2): 10 mg via ORAL
  Filled 2018-12-22 (×2): qty 1

## 2018-12-22 NOTE — Evaluation (Signed)
Physical Therapy Evaluation Patient Details Name: Maria Lang MRN: [id number] DOB: Nov 20, 1980 Today's Date: 12/22/2018   History of Present Illness  Maria Lang is a 38 y.o. female with longstanding hypertension, tobacco use, OSA, GERD, GAD presented to ED complaining of slurred speech and weakness involving the left arm and leg and difficulty with walking after waking up this morning and feeling very drowsy. CTA showed no LVO. CTP showed no deficit.MRI pending.     Clinical Impression  Patient evaluated by Physical Therapy with no further acute PT needs identified. All education has been completed and the patient has no further questions. PTA, pt independent, lives at home with her 3 adult children. Today, ambulating near baseline, stairs and unit without assistance.  See below for any follow-up Physical Therapy or equipment needs. PT is signing off. Thank you for this referral.     Follow Up Recommendations No PT follow up    Equipment Recommendations       Recommendations for Other Services       Precautions / Restrictions Precautions Precautions: Fall Restrictions Weight Bearing Restrictions: No      Mobility  Bed Mobility                  Transfers Overall transfer level: Needs assistance Equipment used: None Transfers: Sit to/from Stand Sit to Stand: Supervision            Ambulation/Gait Ambulation/Gait assistance: Modified independent (Device/Increase time) Gait Distance (Feet): 150 Feet            Stairs Stairs: Yes Stairs assistance: Modified independent (Device/Increase time)     General stair comments: several steps, no assist needed, baseline ambulation   Wheelchair Mobility    Modified Rankin (Stroke Patients Only)       Balance Overall balance assessment: Needs assistance Sitting-balance support: No upper extremity supported;Feet unsupported Sitting balance-Leahy Scale: Good     Standing balance support: No upper  extremity supported;During functional activity Standing balance-Leahy Scale: Good Standing balance comment: able to bend over and pick item off ground, higher level balance activities with minguard-minA for stability                High Level Balance Comments: minA for head turns and braiding due to instability              Pertinent Vitals/Pain Pain Assessment: Faces Faces Pain Scale: No hurt    Home Living Family/patient expects to be discharged to:: Private residence Living Arrangements: Spouse/significant other;Children Available Help at Discharge: Family Type of Home: House Home Access: Stairs to enter Entrance Stairs-Rails: Can reach both Entrance Stairs-Number of Steps: 1 Home Layout: One level Home Equipment: None      Prior Function Level of Independence: Independent         Comments: Pt reports she was independent in all ADL/IADL and was driving. Pt has children 21 and an 66mo. grandchild     Hand Dominance   Dominant Hand: Right    Extremity/Trunk Assessment   Upper Extremity Assessment LUE Deficits / Details: grossly 4/5;demonstrated left arm drift and left pronation drift. full AROM;able to use for bilateral coordination  LUE Sensation: WNL    Lower Extremity Assessment Lower Extremity Assessment: Overall WFL for tasks assessed    Cervical / Trunk Assessment Cervical / Trunk Assessment: Normal  Communication   Communication: No difficulties  Cognition Arousal/Alertness: Awake/alert Behavior During Therapy: WFL for tasks assessed/performed Overall Cognitive Status: Within Functional Limits for tasks assessed  General Comments General comments (skin integrity, edema, etc.): revied BEFAST    Exercises     Assessment/Plan    PT Assessment Patent does not need any further PT services  PT Problem List Decreased strength       PT Treatment Interventions      PT Goals (Current  goals can be found in the Care Plan section)  Acute Rehab PT Goals Patient Stated Goal: to go home PT Goal Formulation: With patient    Frequency     Barriers to discharge        Co-evaluation               AM-PAC PT "6 Clicks" Mobility  Outcome Measure Help needed turning from your back to your side while in a flat bed without using bedrails?: None Help needed moving from lying on your back to sitting on the side of a flat bed without using bedrails?: None Help needed moving to and from a bed to a chair (including a wheelchair)?: None Help needed standing up from a chair using your arms (e.g., wheelchair or bedside chair)?: None Help needed to walk in hospital room?: None Help needed climbing 3-5 steps with a railing? : None 6 Click Score: 24    End of Session Equipment Utilized During Treatment: Gait belt Activity Tolerance: Patient tolerated treatment well Patient left: in bed Nurse Communication: Mobility status PT Visit Diagnosis: Unsteadiness on feet (R26.81)    Time: 1436-1500 PT Time Calculation (min) (ACUTE ONLY): 24 min   Charges:   PT Evaluation $PT Eval Low Complexity: 1 Low PT Treatments $Gait Training: 8-22 mins        Reinaldo Berber, PT, DPT Acute Rehabilitation Services Pager: [phone] Office: [phone]    Reinaldo Berber 12/22/2018, 2:55 PM

## 2018-12-22 NOTE — Progress Notes (Addendum)
STROKE TEAM PROGRESS NOTE   SUBJECTIVE (INTERVAL HISTORY) Her echo tech is at the bedside.  Pt stated that her symptoms much improved. She walked with OT today and did not feel weak on the left. She admitted that she smokes 0.5PPD. she stopped OCP 2 months ago. She had bilateral salpingectomy but used OCP for menorrhagia.    OBJECTIVE Vitals:   12/21/18 2359 12/22/18 0000 12/22/18 0200 12/22/18 0356  BP: (!) 138/98 (!) 138/98 (!) 144/94 (!) 133/103  Pulse: 96 95 86 90  Resp: 16  14 19   Temp: 98.6 F (37 C)  98 F (36.7 C) 99 F (37.2 C)  TempSrc: Oral  Oral Oral  SpO2: 95% 95% 96% 98%  Weight:      Height:        CBC:  Recent Labs  Lab 12/21/18 1121 12/21/18 1808 12/22/18 0634  WBC 9.7 11.6* 10.6*  NEUTROABS 6.8  --   --   HGB 11.8* 12.0 10.5*  HCT 38.5 37.9 33.3*  MCV 88.3 84.8 84.5  PLT 305 335 502    Basic Metabolic Panel:  Recent Labs  Lab 12/21/18 1121 12/21/18 1808 12/22/18 0634  NA 139  --  137  K 4.0  --  3.3*  CL 103  --  105  CO2 25  --  20*  GLUCOSE 129*  --  101*  BUN 14  --  12  CREATININE 0.71 0.67 0.79  CALCIUM 9.8  --  8.9    Lipid Panel:     Component Value Date/Time   CHOL 235 (H) 12/21/2018 1121   TRIG 298 (H) 12/21/2018 1121   HDL 29 (L) 12/21/2018 1121   CHOLHDL 8.1 12/21/2018 1121   VLDL 60 (H) 12/21/2018 1121   LDLCALC 146 (H) 12/21/2018 1121   HgbA1c:  Lab Results  Component Value Date   HGBA1C 5.3 12/22/2018   Urine Drug Screen:     Component Value Date/Time   LABOPIA NONE DETECTED 12/21/2018 1217   COCAINSCRNUR NONE DETECTED 12/21/2018 1217   LABBENZ POSITIVE (A) 12/21/2018 1217   AMPHETMU NONE DETECTED 12/21/2018 1217   THCU NONE DETECTED 12/21/2018 1217   LABBARB NONE DETECTED 12/21/2018 1217    Alcohol Level     Component Value Date/Time   ETH <10 12/21/2018 1121    IMAGING  Ct Code Stroke Cta Head W/wo Contrast  Ct Code Stroke Cta Neck W/wo Contrast Ct Code Stroke Cta Cerebral Perfusion W/wo  Contrast 12/21/2018 IMPRESSION:  1. Negative CTA of head and neck.  2. Negative CT perfusion when allowing for motion artifact.   Mr Brain Wo Contrast 12/22/2018 IMPRESSION:  1. Acute infarct of the ventral right pons, in keeping with the reported left-sided weakness.  2. No hemorrhage or mass effect.   Ct Head Code Stroke Wo Contrast 12/21/2018 IMPRESSION:  Negative. ASPECTS is 10.   Transthoracic Echocardiogram   1. The left ventricle has normal systolic function with an ejection fraction of 60-65%. The cavity size was normal. There is mildly increased left ventricular wall thickness. Left ventricular diastolic Doppler parameters are consistent with impaired  relaxation. No evidence of left ventricular regional wall motion abnormalities.  2. The right ventricle has normal systolic function. The cavity was normal. There is no increase in right ventricular wall thickness.  3. No evidence of mitral valve stenosis. Trivial mitral regurgitation.  4. The aortic valve is tricuspid. No stenosis of the aortic valve.  5. The aortic root is normal in size and structure.  6. Normal IVC size. No complete TR doppler jet so unable to estimate PA systolic pressure.   EKG - ST rate 105 BPM. (See cardiology reading for complete details)   PHYSICAL EXAM  Temp:  [98 F (36.7 C)-99 F (37.2 C)] 99 F (37.2 C) (06/28 1212) Pulse Rate:  [85-105] 85 (06/28 1212) Resp:  [12-25] 25 (06/28 1212) BP: (133-183)/(91-133) 175/120 (06/28 1212) SpO2:  [94 %-99 %] 96 % (06/28 1212) Weight:  [81.6 kg] 81.6 kg (06/28 0308)  General - Well nourished, well developed, in no apparent distress.  Ophthalmologic - fundi not visualized due to noncooperation.  Cardiovascular - Regular rate and rhythm.  Mental Status -  Level of arousal and orientation to time, place, and person were intact. Language including expression, naming, repetition, comprehension was assessed and found intact. Attention span and  concentration were normal. Recent and remote memory were intact. Fund of Knowledge was assessed and was intact.  Cranial Nerves II - XII - II - Visual field intact OU. III, IV, VI - Extraocular movements intact. V - Facial sensation intact bilaterally. VII - Facial movement intact bilaterally. VIII - Hearing & vestibular intact bilaterally. X - Palate elevates symmetrically. XI - Chin turning & shoulder shrug intact bilaterally. XII - Tongue protrusion intact.  Motor Strength - The patient's strength was normal in all extremities and pronator drift was absent.  Bulk was normal and fasciculations were absent.   Motor Tone - Muscle tone was assessed at the neck and appendages and was normal.  Reflexes - The patient's reflexes were symmetrical in all extremities and she had no pathological reflexes.  Sensory - Light touch, temperature/pinprick were assessed and were symmetrical.    Coordination - The patient had normal movements in right hand and foot with no ataxia or dysmetria.  However, left finger-to-nose slight dysmetria with slow action.  Tremor was absent.  Gait and Station - deferred.   ASSESSMENT/PLAN [name] is a 38 y.o. female with history of anxiety, OSA, HTN, HLD, GERD, and current tobacco use presenting with left sided weakness, dysarthria, facial droop and BP 204/126. She did not receive IV t-PA due to late presentation (>4.5 hours from time of onset).  Stroke: Acute infarct of the ventral right pons - likely small vessel disease  Resultant slight left arm dysmetria  CT head  - negative  MRI head - Acute infarct of the ventral right pons   CTA H&N and perfusion - negative  2D Echo EF 60 to 65%. No atrial level shunt detected by color flow Doppler.  LE venous Doppler no DVT  Lacey Jensen Virus 2 - negative  LDL - 146  HgbA1c - 5.3  UDS - positive for benzodiazepines (Rx)  VTE prophylaxis - Lovenox  Diet  - Heart healthy with thin  liquids.  No antithrombotic prior to admission, now on aspirin 81 mg daily and clopidogrel 75 mg daily.  Continue DAPT for 3 weeks and then aspirin alone.  Patient counseled to be compliant with her antithrombotic medications  Ongoing aggressive stroke risk factor management  Therapy recommendations:  none  Disposition:  Pending  Tobacco abuse  Current smoker  Smoking cessation counseling provided  Nicotine patch provided  Pt is willing to quit  Hx of OCP use  stopped OCP 2 months ago.   She had bilateral salpingectomy but used OCP for menorrhagia  Given smoking history, checked LE venous Doppler no DVT  Does not feel this is related to current stroke which is  more likely small vessel disease  Hypertension  BP 204/126 on presentation  Currently stable but still on the high end . On amlodipine 10 . Close PCP follow-up . Long-term BP goal normotensive  Hyperlipidemia  Lipid lowering medication PTA:  none  LDL 146, goal < 70   Current lipid lowering medication: Lipitor 40 mg daily  Continue statin at discharge  Other Stroke Risk Factors  Overweight, Body mass index is 29.95 kg/m., recommend weight loss, diet and exercise as appropriate   Obstructive sleep apnea  Other Active Problems  Hypokalemia - 3.3 -supplement  Slight speech difficulty since young age - stable no change this time as per patient.   Hospital day # 1  Neurology will sign off. Please call with questions. Pt will follow up with stroke clinic NP at [facility] in about 4 weeks. Thanks for the consult.  Rosalin Hawking, MD PhD Stroke Neurology 12/22/2018 12:51 PM   To contact Stroke Continuity provider, please refer to [email or web address] After hours, contact General Neurology

## 2018-12-22 NOTE — Evaluation (Signed)
Speech Language Pathology Evaluation Patient Details Name: Maria Lang MRN: [id number] DOB: 1981/05/02 Today's Date: 12/22/2018 Time: 2536-6440 SLP Time Calculation (min) (ACUTE ONLY): 24 min  Problem List:  Patient Active Problem List   Diagnosis Date Noted  . Acute CVA (cerebrovascular accident) ([place]) 12/21/2018  . Sleep apnea   . Anxiety   . Benzodiazepine dependence ([place])   . Acute Left-sided weakness   . Acute Speech abnormality   . Smoker   . Hyperglycemia   . Hypothyroidism   . Chest pain 12/10/2014  . GERD (gastroesophageal reflux disease) 12/10/2014  . Chronic diastolic CHF (congestive heart failure) ([place]) 12/10/2014  . Hypertension   . Pain in the chest   . Hypertensive emergency    Past Medical History:  Past Medical History:  Diagnosis Date  . Anxiety   . Deviated septum   . GERD (gastroesophageal reflux disease)   . History of bilateral salpingectomy   . Hypertension   . Renal stones   . Sleep apnea    Doesn't use everyday   Past Surgical History:  Past Surgical History:  Procedure Laterality Date  . BILATERAL SALPINGECTOMY  07.2016  . CYSTOSCOPY WITH RETROGRADE PYELOGRAM, URETEROSCOPY AND STENT PLACEMENT Right 07/29/2015   Procedure: CYSTOSCOPY WITH RIGHT RETROGRADE PYELOGRAM, RIGHT URETEROSCOPY AND STENT PLACEMENT;  Surgeon: Irine Seal, MD;  Location: [facility];  Service: Urology;  Laterality: Right;  . ECTOPIC PREGNANCY SURGERY  10/2013  . HOLMIUM LASER APPLICATION Right 08/28/7423   Procedure: HOLMIUM LASER APPLICATION;  Surgeon: Irine Seal, MD;  Location: [facility];  Service: Urology;  Laterality: Right;  . NASAL SEPTUM SURGERY     doen along with tonsillectomy   . TONSILLECTOMY    . TYMPANOSTOMY     x 3   HPI:  Maria Lang is a 38 y.o. female with longstanding hypertension, tobacco use, OSA, GERD, GAD presented to ED complaining of slurred speech and weakness involving the left arm and leg and difficulty with walking after waking up this morning  and feeling very drowsy. She was last known to be normal just prior to according to bed yesterday evening. MRI 6/28 revealed R ventral pontine infarct.   Assessment / Plan / Recommendation Clinical Impression  Pt was able to participate in converation with SLP without difficulty.  Speech was clear, and pt feels that her speech is returning to normal.  She notes that during phone calls, conversation partners have remarked that her speech is improved.  Pt has mild hearing impairment, but does not have hearing aids at this time.  She reports repeated insertion of tubes in ears as a child.  There are very minor articulation differences in some phonemes, notably /s/ and /r/ which do not impair communication.  Pt reports receiving speech therapy in school for articulation.  She has outstanding appointments in the next few weeks with neurology and ENT and reports chronic mastoiditis.  Portions of the COGNISTAT were administered to assess cognitive-linguistic function.  Pt tested within average range on all subtests of the COGNISTAT which were administered except for Similarities, which was one point outside of average range.  Pt denies deficits and does not have any concerns related to communication or cognition. Pt does not require ST intervention at this time.  If there is an acute change, please recosult speech therapy.  If there is concern for silent aspiration given location of infarct, please place orders for swallowng evaluation.  COGNISTAT - All subtests are within the average range, except where otherwise specified Orientation:  12/12 Attention: 8/8 Comprehension: 6/6 Repetition: 12/12 Naming: 8/8 Construction: Not assessed Memory: 10/12 Calculation: 3/4 Similarities: 4/8, Mild Impairment Judgment: 6/6   SLP Assessment  SLP Recommendation/Assessment: Patient does not need any further Speech Lanaguage Pathology Services SLP Visit Diagnosis: Cognitive communication deficit (R41.841)    Follow Up  Recommendations  None       SLP Evaluation Cognition  Overall Cognitive Status: Within Functional Limits for tasks assessed Arousal/Alertness: Awake/alert Orientation Level: Oriented X4 Attention: Sustained Sustained Attention: Appears intact Memory: Appears intact Awareness: Appears intact Problem Solving: Appears intact Reasoning: Appears intact       Comprehension  Auditory Comprehension Overall Auditory Comprehension: Appears within functional limits for tasks assessed Commands: Within Functional Limits Conversation: Complex    Expression Expression Primary Mode of Expression: Verbal Verbal Expression Overall Verbal Expression: Appears within functional limits for tasks assessed Level of Generative/Spontaneous Verbalization: Conversation Repetition: No impairment Naming: No impairment Pragmatics: No impairment Written Expression Dominant Hand: Right   Oral / Motor  Oral Motor/Sensory Function Overall Oral Motor/Sensory Function: Within functional limits   GO                    Celedonio Savage, MA, [place] Office: [phone]; Pager (6/28): [phone] 12/22/2018, 3:00 PM

## 2018-12-22 NOTE — Progress Notes (Signed)
Occupational Therapy Evaluation Patient Details Name: Maria Lang MRN: [id number] DOB: [birth year] Today's Date: 12/22/2018    History of Present Illness Maria Lang is a 38 y.o. female with longstanding hypertension, tobacco use, OSA, GERD, GAD presented to ED complaining of slurred speech and weakness involving the left arm and leg and difficulty with walking after waking up this morning and feeling very drowsy. CTA showed no LVO. CTP showed no deficit.MRI pending.    Clinical Impression   PTA, pt was living at home with her husband, and was independent with ADL/IADL and functional mobility. Pt currently requires Supervision-minguard for ADL and functional mobility. Pt demonstrates decreased strength in LUE. Due to decline in current level of function, pt would benefit from acute OT to address established goals to facilitate safe D/C to venue listed below. Anticipate pt will continue to progress toward baseline and will not need OT following d/c home. Will continue to follow acutely to address tub transfers, higher dynamic balance during ADL/IADL and UE HEP.     Follow Up Recommendations  No OT follow up;Supervision - Intermittent    Equipment Recommendations  None recommended by OT    Recommendations for Other Services PT consult     Precautions / Restrictions Precautions Precautions: Fall Restrictions Weight Bearing Restrictions: No      Mobility Bed Mobility                  Transfers Overall transfer level: Needs assistance Equipment used: None Transfers: Sit to/from Stand Sit to Stand: Supervision              Balance Overall balance assessment: Needs assistance Sitting-balance support: No upper extremity supported;Feet unsupported Sitting balance-Leahy Scale: Good     Standing balance support: No upper extremity supported;During functional activity Standing balance-Leahy Scale: Good Standing balance comment: able to bend over and pick item off  ground, higher level balance activities with minguard-minA for stability              High level balance activites: Direction changes;Backward walking;Side stepping;Braiding;Head turns High Level Balance Comments: minA for head turns and braiding due to instability            ADL either performed or assessed with clinical judgement   ADL Overall ADL's : Needs assistance/impaired Eating/Feeding: Set up;Sitting   Grooming: Supervision/safety;Standing Grooming Details (indicate cue type and reason): pt completed grooming while standing at the sink  Upper Body Bathing: Standing;Supervision/ safety;Min guard   Lower Body Bathing: Sit to/from stand;Supervison/ safety   Upper Body Dressing : Standing;Supervision/safety   Lower Body Dressing: Sit to/from stand;Supervision/safety Lower Body Dressing Details (indicate cue type and reason): pt able to figure-4 to access BLE Toilet Transfer: Min guard;Ambulation Toilet Transfer Details (indicate cue type and reason): minguard for line management and safety no LOB noted Toileting- Clothing Manipulation and Hygiene: Min guard;Sit to/from stand   Tub/ Shower Transfer: Insurance claims handler Details (indicate cue type and reason): minguard-minA for simulated tub transfer Functional mobility during ADLs: Min guard General ADL Comments: pt demonstrates minor instability;minguard for safety with lines and higher level dynamic balance     Vision Baseline Vision/History: No visual deficits Vision Assessment?: Yes;No apparent visual deficits     Perception     Praxis      Pertinent Vitals/Pain Pain Assessment: No/denies pain     Hand Dominance Right   Extremity/Trunk Assessment Upper Extremity Assessment Upper Extremity Assessment: LUE deficits/detail LUE Deficits / Details: grossly 4/5;demonstrated left arm drift and  left pronation drift. full AROM;able to use for bilateral coordination  LUE Sensation:  WNL   Lower Extremity Assessment Lower Extremity Assessment: Defer to PT evaluation   Cervical / Trunk Assessment Cervical / Trunk Assessment: Normal   Communication Communication Communication: No difficulties   Cognition Arousal/Alertness: Awake/alert Behavior During Therapy: WFL for tasks assessed/performed Overall Cognitive Status: Within Functional Limits for tasks assessed                                     General Comments  BP 178/108;pt reporting she was cold but felt clammy, RN aware;reviewed signs and symptoms of stroke with written BE FAST accronym, pt verbalized understanding     Exercises     Shoulder Instructions      Home Living Family/patient expects to be discharged to:: Private residence Living Arrangements: Spouse/significant other;Children Available Help at Discharge: Family Type of Home: House Home Access: Stairs to enter Technical brewer of Steps: 1 Entrance Stairs-Rails: Can reach both Home Layout: One level     Bathroom Shower/Tub: Walk-in shower;Tub only   Bathroom Toilet: Standard Bathroom Accessibility: Yes How Accessible: Accessible via walker Home Equipment: None          Prior Functioning/Environment Level of Independence: Independent        Comments: Pt reports she was independent in all ADL/IADL and was driving. Pt has children 21 and an 38mo. grandchild        OT Problem List: Decreased activity tolerance;Decreased strength;Impaired balance (sitting and/or standing);Decreased safety awareness;Decreased knowledge of precautions      OT Treatment/Interventions: Self-care/ADL training;Therapeutic exercise;Therapeutic activities;Patient/family education;Balance training    OT Goals(Current goals can be found in the care plan section) Acute Rehab OT Goals Patient Stated Goal: to go home OT Goal Formulation: With patient Time For Goal Achievement: 01/05/19 Potential to Achieve Goals: Good ADL Goals Pt  Will Perform Grooming: Independently;standing Pt Will Perform Tub/Shower Transfer: Tub transfer;Shower transfer;Independently Pt/caregiver will Perform Home Exercise Program: Both right and left upper extremity;Increased strength;Independently;With written HEP provided  OT Frequency: Min 2X/week   Barriers to D/C:            Co-evaluation              AM-PAC OT "6 Clicks" Daily Activity     Outcome Measure Help from another person eating meals?: None Help from another person taking care of personal grooming?: A Little Help from another person toileting, which includes using toliet, bedpan, or urinal?: A Little Help from another person bathing (including washing, rinsing, drying)?: A Little Help from another person to put on and taking off regular upper body clothing?: A Little Help from another person to put on and taking off regular lower body clothing?: A Little 6 Click Score: 19   End of Session Equipment Utilized During Treatment: Gait belt Nurse Communication: Mobility status  Activity Tolerance: Patient tolerated treatment well Patient left: in chair;with call bell/phone within reach;with chair alarm set  OT Visit Diagnosis: Unsteadiness on feet (R26.81);Other abnormalities of gait and mobility (R26.89)                Time: 1062-6948 OT Time Calculation (min): 33 min Charges:  OT General Charges $OT Visit: 1 Visit OT Evaluation $OT Eval Low Complexity: 1 Low OT Treatments $Self Care/Home Management : 8-22 mins  Dorinda Hill OTR/L Acute Rehabilitation Services Office: [place] 12/22/2018, 9:35 AM

## 2018-12-22 NOTE — Progress Notes (Signed)
  Echocardiogram 2D Echocardiogram has been performed.  Maria Lang 12/22/2018, 11:46 AM

## 2018-12-22 NOTE — Progress Notes (Signed)
PROGRESS NOTE  Maria Lang HGD:[id number] DOB: 09-05-1980 DOA: 12/21/2018 PCP: Harlene Salts, PA-C  HPI/Recap of past 24 hours: Maria Lang is a 38 y.o. female with longstanding hypertension, tobacco use, OSA, GERD, GAD presented to ED complaining of slurred speech and weakness involving the left arm and leg and difficulty with walking after waking up this morning and feeling very drowsy.  She denies shortness of breath and chest pain.  She was last known to be normal just prior to according to bed yesterday evening.  She woke up at around 9 AM.  She apparently had taken 2 mg of Xanax last night.  She takes this for GAD.  She denies cough fever chills and sick contacts.  ED Course: Patient was hypertensive on arrival with a systolic blood pressure greater than 220.  Code stroke was called and she was seen by tele-neurologist who examined her and found her to have left-sided weakness ataxia and left-sided facial weakness.  Symptoms were suggestive of right pontine stroke.  The patient had CTA of the head and neck that was negative.  No LVO.  She was not a candidate for TPA.  It was recommended that patient start aspirin 325 mg.  The patient was recommended for admission for a full stroke work-up.  EKG showed signs of sinus tachycardia.  Patient had normal electrolytes.  CT brain did not show acute findings.  12/22/18: Patient seen and examined at bedside.  She reports a very mild headache rated 4 out of 10.  Still some weakness on her left side.  She reports no known family history of CVA.  Admits to a family history of heart disease and uncontrolled hypertension.  She denies any chest pain or palpitations.  MRI brain done this morning positive for acute infarct involving the ventral right pons.  Ongoing permissive hypertension.  Neurology following.  Assessment/Plan: Principal Problem:   Acute CVA (cerebrovascular accident) [facility]) Active Problems:   Hypertension   Hypertensive emergency  GERD (gastroesophageal reflux disease)   Chronic diastolic CHF (congestive heart failure) (HCC)   Sleep apnea   Anxiety   Benzodiazepine dependence (HCC)   Acute Left-sided weakness   Acute Speech abnormality   Smoker   Hyperglycemia   Hypothyroidism  Acute CVA ventral R pons Presented with hypertensive emergency CT head no contrast negative MRI brain done on 12/22/2018+ for acute infarct involving the ventral right pons LDL 146 with goal less than 70 A1c 5.4 with goal less than 7.0 Started on Lipitor 80 mg daily Patient stated she was taking aspirin 81 mg daily and ran out about a week ago Currently on aspirin 325 mg daily Possible dual antiplatelets once blood pressure is better controlled PT OT/speech therapy Fall precautions Neurology following 2D echo revealed LVEF 60 to 65% no mention of PFO.  Hypertensive emergency with acute CVA Resume Norvasc 10 mg daily Also on Hyzaar 50/12.5 mg daily Ongoing permissive hypertension  BP no higher than 220/120 Continue to closely monitor VS  Chronic depression/anxiety-continue Zoloft  Tobacco use disorder Tobacco cessation counseling Nicotine patch  GERD Continue Protonix 40 twice daily   Hyperlipidemia LDL 146 with goal less than 70 Continue Lipitor 80 mg daily  Hypothyroidism Continue levothyroxine     Objective: Vitals:   12/22/18 0000 12/22/18 0200 12/22/18 0356 12/22/18 1212  BP: (!) 138/98 (!) 144/94 (!) 133/103 (!) 175/120  Pulse: 95 86 90 85  Resp:  14 19 (!) 25  Temp:  98 F (36.7 C) 99 F (37.2  C) 99 F (37.2 C)  TempSrc:  Oral Oral Oral  SpO2: 95% 96% 98% 96%  Weight:      Height:       No intake or output data in the 24 hours ending 12/22/18 1224 Filed Weights   12/21/18 1113  Weight: 81.6 kg    Exam:  . General: 38 y.o. year-old female well developed well nourished in no acute distress.  Alert and oriented x3. . Cardiovascular: Regular rate and rhythm with no rubs or gallops.  No  thyromegaly or JVD noted.   [name] Kitchen Respiratory: Clear to auscultation with no wheezes or rales. Good inspiratory effort. . Abdomen: Soft nontender nondistended with normal bowel sounds x4 quadrants. . Musculoskeletal: No lower extremity edema. 2/4 pulses in all 4 extremities.  4 out of 5 strength in left upper and left lower extremity . Psychiatry: Mood is appropriate for condition and setting   Data Reviewed: CBC: Recent Labs  Lab 12/21/18 1121 12/21/18 1808 12/22/18 0634  WBC 9.7 11.6* 10.6*  NEUTROABS 6.8  --   --   HGB 11.8* 12.0 10.5*  HCT 38.5 37.9 33.3*  MCV 88.3 84.8 84.5  PLT 305 335 619   Basic Metabolic Panel: Recent Labs  Lab 12/21/18 1121 12/21/18 1808 12/22/18 0634  NA 139  --  137  K 4.0  --  3.3*  CL 103  --  105  CO2 25  --  20*  GLUCOSE 129*  --  101*  BUN 14  --  12  CREATININE 0.71 0.67 0.79  CALCIUM 9.8  --  8.9   GFR: Estimated Creatinine Clearance: 102.5 mL/min (by C-G formula based on SCr of 0.79 mg/dL). Liver Function Tests: Recent Labs  Lab 12/21/18 1121 12/22/18 0634  AST 13* 12*  ALT 15 15  ALKPHOS 66 52  BILITOT 0.3 0.6  PROT 8.1 6.5  ALBUMIN 4.6 3.5   No results for input(s): LIPASE, AMYLASE in the last 168 hours. No results for input(s): AMMONIA in the last 168 hours. Coagulation Profile: Recent Labs  Lab 12/21/18 1121  INR 1.0   Cardiac Enzymes: No results for input(s): CKTOTAL, CKMB, CKMBINDEX, TROPONINI in the last 168 hours. BNP (last 3 results) No results for input(s): PROBNP in the last 8760 hours. HbA1C: Recent Labs    12/21/18 1121 12/22/18 0634  HGBA1C 5.4 5.3   CBG: No results for input(s): GLUCAP in the last 168 hours. Lipid Profile: Recent Labs    12/21/18 1121  CHOL 235*  HDL 29*  LDLCALC 146*  TRIG 298*  CHOLHDL 8.1   Thyroid Function Tests: No results for input(s): TSH, T4TOTAL, FREET4, T3FREE, THYROIDAB in the last 72 hours. Anemia Panel: No results for input(s): VITAMINB12, FOLATE, FERRITIN,  TIBC, IRON, RETICCTPCT in the last 72 hours. Urine analysis:    Component Value Date/Time   COLORURINE YELLOW 12/21/2018 1217   APPEARANCEUR CLOUDY (A) 12/21/2018 1217   LABSPEC 1.029 12/21/2018 1217   PHURINE 7.0 12/21/2018 1217   GLUCOSEU NEGATIVE 12/21/2018 1217   HGBUR NEGATIVE 12/21/2018 1217   [place] 12/21/2018 1217   KETONESUR NEGATIVE 12/21/2018 1217   PROTEINUR NEGATIVE 12/21/2018 1217   NITRITE NEGATIVE 12/21/2018 1217   LEUKOCYTESUR MODERATE (A) 12/21/2018 1217   Sepsis Labs: @LABRCNTIP (procalcitonin:4,lacticidven:4)  ) Recent Results (from the past 240 hour(s))  SARS Coronavirus 2 (CEPHEID - Performed in [place] hospital lab), Hosp Order     Status: None   Collection Time: 12/21/18  1:33 PM   Specimen: Nasopharyngeal  Swab  Result Value Ref Range Status   SARS Coronavirus 2 NEGATIVE NEGATIVE Final    Comment: (NOTE) If result is NEGATIVE SARS-CoV-2 target nucleic acids are NOT DETECTED. The SARS-CoV-2 RNA is generally detectable in upper and lower  respiratory specimens during the acute phase of infection. The lowest  concentration of SARS-CoV-2 viral copies this assay can detect is 250  copies / mL. A negative result does not preclude SARS-CoV-2 infection  and should not be used as the sole basis for treatment or other  patient management decisions.  A negative result may occur with  improper specimen collection / handling, submission of specimen other  than nasopharyngeal swab, presence of viral mutation(s) within the  areas targeted by this assay, and inadequate number of viral copies  (<250 copies / mL). A negative result must be combined with clinical  observations, patient history, and epidemiological information. If result is POSITIVE SARS-CoV-2 target nucleic acids are DETECTED. The SARS-CoV-2 RNA is generally detectable in upper and lower  respiratory specimens dur ing the acute phase of infection.  Positive  results are indicative of  active infection with SARS-CoV-2.  Clinical  correlation with patient history and other diagnostic information is  necessary to determine patient infection status.  Positive results do  not rule out bacterial infection or co-infection with other viruses. If result is PRESUMPTIVE POSTIVE SARS-CoV-2 nucleic acids MAY BE PRESENT.   A presumptive positive result was obtained on the submitted specimen  and confirmed on repeat testing.  While 2019 novel coronavirus  (SARS-CoV-2) nucleic acids may be present in the submitted sample  additional confirmatory testing may be necessary for epidemiological  and / or clinical management purposes  to differentiate between  SARS-CoV-2 and other Sarbecovirus currently known to infect humans.  If clinically indicated additional testing with an alternate test  methodology [phone]) is advised. The SARS-CoV-2 RNA is generally  detectable in upper and lower respiratory sp ecimens during the acute  phase of infection. The expected result is Negative. Fact Sheet for Patients:  StrictlyIdeas.no Fact Sheet for Healthcare Providers: BankingDealers.co.za This test is not yet approved or cleared by the [place] FDA and has been authorized for detection and/or diagnosis of SARS-CoV-2 by FDA under an Emergency Use Authorization (EUA).  This EUA will remain in effect (meaning this test can be used) for the duration of the COVID-19 declaration under Section 564(b)(1) of the Act, 21 U.S.C. section 360bbb-3(b)(1), unless the authorization is terminated or revoked sooner. Performed at [facility], [address] [place], [place] [address]       Studies: Mr Brain 78 Contrast  Result Date: 12/22/2018 CLINICAL DATA:  Slurred speech and left-sided weakness EXAM: MRI HEAD WITHOUT CONTRAST TECHNIQUE: Multiplanar, multiecho pulse sequences of the brain and surrounding structures were obtained without intravenous  contrast. COMPARISON:  CTA head neck 12/21/2018 FINDINGS: BRAIN: There is an area abnormal diffusion restriction within the ventral right pons. The area measures series approximately 1.6 x 0.8 cm. The midline structures are normal. The white matter signal is normal for the patient's age. The cerebral and cerebellar volume are age-appropriate. No hydrocephalus. Susceptibility-sensitive sequences show no chronic microhemorrhage or superficial siderosis. No midline shift or other mass effect. VASCULAR: The major intracranial arterial and venous sinus flow voids are normal. SKULL AND UPPER CERVICAL SPINE: Calvarial bone marrow signal is normal. There is no skull base mass. Visualized upper cervical spine and soft tissues are normal. SINUSES/ORBITS: No fluid levels or advanced mucosal thickening. No mastoid or  middle ear effusion. The orbits are normal. IMPRESSION: 1. Acute infarct of the ventral right pons, in keeping with the reported left-sided weakness. 2. No hemorrhage or mass effect. Electronically Signed   By: Ulyses Jarred M.D.   On: 12/22/2018 03:38    Scheduled Meds: . amLODipine  10 mg Oral Daily  . aspirin EC  81 mg Oral Daily  . atorvastatin  80 mg Oral q1800  . clopidogrel  75 mg Oral Daily  . enoxaparin (LOVENOX) injection  40 mg Subcutaneous Q24H  . levothyroxine  50 mcg Oral Q0600  . nicotine  21 mg Transdermal Daily  . pantoprazole  40 mg Oral BID AC  . potassium chloride  20 mEq Oral TID  . sertraline  150 mg Oral Daily    Continuous Infusions:   LOS: 1 day     Kayleen Memos, MD [facility] Pager [phone]  If 7PM-7AM, please contact night-coverage [email or web address] Password [facility] 12/22/2018, 12:24 PM

## 2018-12-22 NOTE — Progress Notes (Signed)
VASCULAR LAB PRELIMINARY  PRELIMINARY  PRELIMINARY  PRELIMINARY  Bilateral lower extremity venous duplex completed.    Preliminary report:  See CV proc for preliminary results.   Tydus Sanmiguel, RVT 12/22/2018, 2:41 PM

## 2018-12-23 LAB — BASIC METABOLIC PANEL
Anion gap: 9 (ref 5–15)
BUN: 13 mg/dL (ref 6–20)
CO2: 20 mmol/L — ABNORMAL LOW (ref 22–32)
Calcium: 9.6 mg/dL (ref 8.9–10.3)
Chloride: 108 mmol/L (ref 98–111)
Creatinine, Ser: 0.77 mg/dL (ref 0.44–1.00)
GFR calc Af Amer: >60 mL/min (ref >60)
GFR calc non Af Amer: >60 mL/min (ref >60)
Glucose, Bld: 110 mg/dL — ABNORMAL HIGH (ref 70–99)
Potassium: 4.2 mmol/L (ref 3.5–5.1)
Sodium: 137 mmol/L (ref 135–145)

## 2018-12-23 LAB — COMPREHENSIVE METABOLIC PANEL
ALT: 14 U/L (ref 0–44)
AST: 10 U/L — ABNORMAL LOW (ref 15–41)
Albumin: 3.9 g/dL (ref 3.5–5.0)
Alkaline Phosphatase: 58 U/L (ref 38–126)
Anion gap: 9 (ref 5–15)
BUN: 12 mg/dL (ref 6–20)
CO2: 20 mmol/L — ABNORMAL LOW (ref 22–32)
Calcium: 9.2 mg/dL (ref 8.9–10.3)
Chloride: 110 mmol/L (ref 98–111)
Creatinine, Ser: 0.74 mg/dL (ref 0.44–1.00)
GFR calc Af Amer: >60 mL/min (ref >60)
GFR calc non Af Amer: >60 mL/min (ref >60)
Glucose, Bld: 82 mg/dL (ref 70–99)
Potassium: 3.8 mmol/L (ref 3.5–5.1)
Sodium: 139 mmol/L (ref 135–145)
Total Bilirubin: 0.7 mg/dL (ref 0.3–1.2)
Total Protein: 6.7 g/dL (ref 6.5–8.1)

## 2018-12-23 LAB — CBC
HCT: 35.4 % — ABNORMAL LOW (ref 36.0–46.0)
Hemoglobin: 11 g/dL — ABNORMAL LOW (ref 12.0–15.0)
MCH: 26.6 pg (ref 26.0–34.0)
MCHC: 31.1 g/dL (ref 30.0–36.0)
MCV: 85.5 fL (ref 80.0–100.0)
Platelets: 292 10*3/uL (ref 150–400)
RBC: 4.14 MIL/uL (ref 3.87–5.11)
RDW: 14.7 % (ref 11.5–15.5)
WBC: 10.3 10*3/uL (ref 4.0–10.5)
nRBC: 0 % (ref 0.0–0.2)

## 2018-12-23 LAB — URINE CULTURE

## 2018-12-23 MED ORDER — PROPRANOLOL HCL ER 120 MG PO CP24
120.0000 mg | ORAL_CAPSULE | Freq: Every day | ORAL | Status: DC
  Filled 2018-12-23 (×2): qty 1

## 2018-12-23 MED ORDER — LOSARTAN POTASSIUM-HCTZ 50-12.5 MG PO TABS: 1.0000 | ORAL_TABLET | Freq: Every day | ORAL | Status: DC

## 2018-12-23 MED ORDER — CLOPIDOGREL BISULFATE 75 MG PO TABS: 75.0000 mg | ORAL_TABLET | Freq: Every day | ORAL | 0 refills | Status: AC

## 2018-12-23 MED ORDER — ATORVASTATIN CALCIUM 40 MG PO TABS: 40.0000 mg | ORAL_TABLET | Freq: Every day | ORAL | 0 refills | Status: DC

## 2018-12-23 MED ORDER — POTASSIUM CHLORIDE CRYS ER 20 MEQ PO TBCR
40.0000 meq | EXTENDED_RELEASE_TABLET | Freq: Once | ORAL | Status: AC
  Administered 2018-12-23: 40 meq via ORAL
  Filled 2018-12-23: qty 2

## 2018-12-23 MED ORDER — LOSARTAN POTASSIUM 50 MG PO TABS
50.0000 mg | ORAL_TABLET | Freq: Every day | ORAL | Status: DC
  Administered 2018-12-23: 50 mg via ORAL
  Filled 2018-12-23: qty 1

## 2018-12-23 MED ORDER — PROPRANOLOL HCL ER 120 MG PO CP24: 120.0000 mg | ORAL_CAPSULE | Freq: Every day | ORAL | 0 refills | Status: DC

## 2018-12-23 MED ORDER — NICOTINE 21 MG/24HR TD PT24: 21.0000 mg | MEDICATED_PATCH | Freq: Every day | TRANSDERMAL | 0 refills | Status: DC

## 2018-12-23 MED ORDER — ASPIRIN 81 MG PO TBEC: 81.0000 mg | DELAYED_RELEASE_TABLET | Freq: Every day | ORAL | 0 refills | Status: DC

## 2018-12-23 MED ORDER — HYDROCHLOROTHIAZIDE 12.5 MG PO CAPS
12.5000 mg | ORAL_CAPSULE | Freq: Every day | ORAL | Status: DC
  Administered 2018-12-23: 12.5 mg via ORAL
  Filled 2018-12-23 (×2): qty 1

## 2018-12-23 NOTE — Progress Notes (Signed)
Occupational Therapy Treatment Patient Details Name: Maria Lang MRN: [id number] DOB: 05/03/1981 Today's Date: 12/23/2018    History of present illness Maria Lang is a 38 y.o. female with longstanding hypertension, tobacco use, OSA, GERD, GAD presented to ED complaining of slurred speech and weakness involving the left arm and leg and difficulty with walking after waking up this morning and feeling very drowsy. CTA showed no LVO. CTP showed no deficit.MRI pending.    OT comments  Pt presents sitting in recliner, pleasant and willing to participate in therapy session, very eager to discharge home. Focus of session on LUE strengthening HEP given some residual L side weakness. Issued pt level 2 theraband and HEP and instructed pt in strengthening HEP. Pt return demonstrating seated exercises using bil UE, min cues for technique throughout. Pt reports having completed morning ADL including LB dressing today without external assist. Feel POC remains appropriate at this time. Will continue to follow.   Follow Up Recommendations  No OT follow up;Supervision - Intermittent    Equipment Recommendations  None recommended by OT          Precautions / Restrictions Precautions Precautions: Fall Restrictions Weight Bearing Restrictions: No       Mobility Bed Mobility               General bed mobility comments: Pt received OOB in recliner                     Balance Overall balance assessment: Needs assistance Sitting-balance support: No upper extremity supported;Feet unsupported Sitting balance-Leahy Scale: Good                                     ADL either performed or assessed with clinical judgement   ADL Overall ADL's : Needs assistance/impaired                                       General ADL Comments: pt reports having completed her morning ADL including LB dressing without assist today; focus of session on LUE  strengthening/HEP                       Cognition Arousal/Alertness: Awake/alert Behavior During Therapy: WFL for tasks assessed/performed Overall Cognitive Status: Within Functional Limits for tasks assessed                                          Exercises Exercises: General Upper Extremity General Exercises - Upper Extremity Shoulder Flexion: AROM;Both;Left;10 reps;Theraband Theraband Level (Shoulder Flexion): Level 2 (Red) Shoulder Horizontal ABduction: AROM;Both;10 reps;Theraband Theraband Level (Shoulder Horizontal Abduction): Level 2 (Red) Shoulder Horizontal ADduction: AROM;Both;10 reps;Theraband Theraband Level (Shoulder Horizontal Adduction): Level 2 (Red) Elbow Flexion: AROM;Both;10 reps;Theraband Theraband Level (Elbow Flexion): Level 2 (Red) Elbow Extension: AROM;Both;10 reps;Theraband Theraband Level (Elbow Extension): Level 2 (Red)   Shoulder Instructions       General Comments      Pertinent Vitals/ Pain       Faces Pain Scale: No hurt  Home Living  Prior Functioning/Environment              Frequency  Min 2X/week        Progress Toward Goals  OT Goals(current goals can now be found in the care plan section)  Progress towards OT goals: Progressing toward goals  Acute Rehab OT Goals Patient Stated Goal: to go home OT Goal Formulation: With patient Time For Goal Achievement: 01/05/19 Potential to Achieve Goals: Good  Plan Discharge plan remains appropriate    Co-evaluation                 AM-PAC OT "6 Clicks" Daily Activity     Outcome Measure   Help from another person eating meals?: None Help from another person taking care of personal grooming?: None Help from another person toileting, which includes using toliet, bedpan, or urinal?: None Help from another person bathing (including washing, rinsing, drying)?: A Little Help from another  person to put on and taking off regular upper body clothing?: None Help from another person to put on and taking off regular lower body clothing?: A Little 6 Click Score: 22    End of Session    OT Visit Diagnosis: Unsteadiness on feet (R26.81);Other abnormalities of gait and mobility (R26.89)   Activity Tolerance Patient tolerated treatment well   Patient Left in chair;with call bell/phone within reach;with nursing/sitter in room   Nurse Communication Mobility status        Time: 0950-1007 OT Time Calculation (min): 17 min  Charges: OT General Charges $OT Visit: 1 Visit OT Treatments $Therapeutic Activity: 8-22 mins  Lou Cal, OT Supplemental Rehabilitation Services Pager [phone] Office [place] 12/23/2018, 3:10 PM

## 2018-12-23 NOTE — Discharge Summary (Signed)
Discharge Summary  Maria Lang OEU:[id number] DOB: 1981/05/04  PCP: Harlene Salts, PA-C  Admit date: 12/21/2018 Discharge date: 12/23/2018  Time spent: 25 minutes  Recommendations for Outpatient Follow-up:  1. Follow-up with neurology 2. Follow-up with your primary care provider 3. Take your medications as prescribed 4. Completely abstain from tobacco use  Continue Aspirin 81 mg daily and plavix 75 mg daily x 3 weeks and then Aspirin alone  Discharge Diagnoses:  Active Hospital Problems   Diagnosis Date Noted   Acute CVA (cerebrovascular accident) ([place]) 12/21/2018   Sleep apnea    Anxiety    Benzodiazepine dependence ([place])    Acute Left-sided weakness    Acute Speech abnormality    Smoker    Hyperglycemia    Hypothyroidism    GERD (gastroesophageal reflux disease) 12/10/2014   Chronic diastolic CHF (congestive heart failure) ([place]) 12/10/2014   Hypertension    Hypertensive emergency     Resolved Hospital Problems  No resolved problems to display.    Discharge Condition: Stable  Diet recommendation: Resume previous diet low sodium diet  Vitals:   12/23/18 0431 12/23/18 0812  BP: (!) 170/109 (!) 163/111  Pulse: 85   Resp: 17 17  Temp: 98.4 F (36.9 C) 98.6 F (37 C)  SpO2: 97% 100%    History of present illness:  Maria Alvarezis a 38 y.o.femalewith longstanding hypertension, tobacco use, OSA, GERD, GAD presented to ED complaining of slurred speech andweakness involving the left arm and leg and difficulty with walking after waking up this morningand feeling very drowsy.She denies shortness of breath and chest pain. She was last known to be normal just prior to going to bed yesterday evening. She woke up at around 9 AM. She apparently had taken 2 mg of Xanax last night. She takes this for GAD. She denies cough fever chills and sick contacts.  Patient was hypertensive on arrival with a systolic blood pressure greater than 220. Code  stroke was called and she was seen by tele-neurologist who examined her and found her to have left-sided weakness ataxia and left-sided facial weakness. Symptoms were suggestive of right pontine stroke. The patient had CTA of the head and neck that was negative. No LVO. She was not a candidate for TPA. It was recommended that patient start aspirin 325 mg. The patient was recommended for admission for a full stroke work-up. EKG showed signs of sinus tachycardia. Patient had normal electrolytes. CT brain did not show acute findings. MRI brain confirmed acute infarct involving the ventral right pons. Neurology followed.  12/23/18: Patient seen and examined at bedside.  She denies a headache or change in vision, states her strength is improved.  She wants to go home.  She has no new concerns.  Patient advised to have a close follow-up appointment with her primary care provider for her uncontrolled hypertension.  Patient understands and agrees to plan.  States she will call her primary care provider today.  Hospital Course:  Principal Problem:   Acute CVA (cerebrovascular accident) [facility]) Active Problems:   Hypertension   Hypertensive emergency   GERD (gastroesophageal reflux disease)   Chronic diastolic CHF (congestive heart failure) ([facility])   Sleep apnea   Anxiety   Benzodiazepine dependence ([facility])   Acute Left-sided weakness   Acute Speech abnormality   Smoker   Hyperglycemia   Hypothyroidism  Acute infarct/ CVA ventral R pons, likely small vessel disease Presented with hypertensive emergency CT head no contrast negative MRI brain done on 12/22/2018+ for acute  infarct involving the ventral right pons CTA H&N and perfusion - negative 2D Echo EF 60 to 65%. No atrial level shunt detected by color flow Doppler. LE venous Doppler no DVT Maria Lang Virus 2 - negative LDL 146 with goal less than 70 A1c 5.3 with goal less than 7.0 Continue Lipitor 80 mg daily Continue ASA 81 mg daily and  plavix 75 mg daily x 3 weeks and then ASA alone Patient counseled to be compliant with her medications. UDS positive for benzodiazepine Patient advised to completely abstain from tobacco use. PT OT/speech therapy- no further recommendations  Hypertensive emergency with acute CVA BP 204/126 on presentation Continue Norvasc 10 mg daily Continue Hyzaar 50/12.5 mg daily Continue propranolol 120 mg daily Close PCP follow up recommended. Patient understands and agrees to plan.  Chronic depression/anxiety-continue Zoloft  Tobacco use disorder Tobacco cessation counseling Nicotine patch Pt is willing to quit  GERD Continue Protonix 40 twice daily  Hx of OCP use stopped OCP 2 months ago.  She had bilateral salpingectomy but used OCP for menorrhagia Given smoking history, checked LE venous Doppler no DVT Does not feel this is related to current stroke which is more likely small vessel disease  Hyperlipidemia LDL 146 with goal less than 70 Continue Lipitor 40 mg daily Follow up with your pCP  Hypothyroidism Continue levothyroxine  Other Stroke Risk Factors Overweight, Body mass index is 29.95 kg/m., recommend weight loss, diet and exercise as appropriate Obstructive sleep apnea   Consult: Neurology.  Discharge Exam: BP (!) 163/111 (BP Location: Right Arm)    Pulse 85    Temp 98.6 F (37 C) (Oral)    Resp 17    Ht 5\' 5"  (1.651 m)    Wt 81.6 kg    SpO2 100%    BMI 29.95 kg/m   General: 38 y.o. year-old female well developed well nourished in no acute distress.  Alert and oriented x3.  Cardiovascular: Regular rate and rhythm with no rubs or gallops.  No thyromegaly or JVD noted.    Respiratory: Clear to auscultation with no wheezes or rales. Good inspiratory effort.  Abdomen: Soft nontender nondistended with normal bowel sounds x4 quadrants.  Musculoskeletal: No lower extremity edema. 2/4 pulses in all 4 extremities. Normal strength bilaterally.  Skin: No  ulcerative lesions noted or rashes,  Psychiatry: Mood is appropriate for condition and setting  Discharge Instructions You were cared for by a hospitalist during your hospital stay. If you have any questions about your discharge medications or the care you received while you were in the hospital after you are discharged, you can call the unit and asked to speak with the hospitalist on call if the hospitalist that took care of you is not available. Once you are discharged, your primary care physician will handle any further medical issues. Please note that NO REFILLS for any discharge medications will be authorized once you are discharged, as it is imperative that you return to your primary care physician (or establish a relationship with a primary care physician if you do not have one) for your aftercare needs so that they can reassess your need for medications and monitor your lab values.  Discharge Instructions    Ambulatory referral to Neurology   Complete by: As directed    Follow up with stroke clinic NP (Jessica Vanschaick or Cecille Rubin, if both not available, consider Zachery Dauer, or Ahern) at [facility] in about 4 weeks. Thanks.     Allergies as of 12/23/2018  Reactions   Hydrocodeine [dihydrocodeine] Shortness Of Breath, Nausea And Vomiting   Hydrocodone-acetaminophen Shortness Of Breath, Nausea Only   Coreg [carvedilol]    Headache   Labetalol Other (See Comments)   Head itches      Medication List    STOP taking these medications   varenicline 1 MG tablet Commonly known as: CHANTIX     TAKE these medications   ALPRAZolam 1 MG tablet Commonly known as: XANAX Take 0.5-1 tablets by mouth 3 (three) times daily as needed for anxiety.   amLODipine 10 MG tablet Commonly known as: NORVASC Take 1 tablet (10 mg total) by mouth daily.   aspirin 81 MG EC tablet Take 1 tablet (81 mg total) by mouth daily.   atorvastatin 40 MG tablet Commonly known as: LIPITOR Take 1  tablet (40 mg total) by mouth daily at 6 PM.   clopidogrel 75 MG tablet Commonly known as: PLAVIX Take 1 tablet (75 mg total) by mouth daily for 21 days.   fluticasone 50 MCG/ACT nasal spray Commonly known as: FLONASE Place 2 sprays into both nostrils daily as needed.   levothyroxine 50 MCG tablet Commonly known as: SYNTHROID TAKE ONE TABLET BY MOUTH EVERY MORNING ON AN EMPTY STOMACH   losartan-hydrochlorothiazide 50-12.5 MG tablet Commonly known as: HYZAAR TAKE ONE TABLET BY MOUTH ONCE DAILY   nicotine 21 mg/24hr patch Commonly known as: NICODERM CQ - dosed in mg/24 hours Place 1 patch (21 mg total) onto the skin daily.   ondansetron 4 MG disintegrating tablet Commonly known as: Zofran ODT Take 1 tablet (4 mg total) by mouth every 6 (six) hours as needed for nausea or vomiting.   pantoprazole 40 MG tablet Commonly known as: PROTONIX TAKE 1 TABLET BY MOUTH TWICE DAILY BEFORE BREAKFAST AND DINNER   Procto-Med HC 2.5 % rectal cream Generic drug: hydrocortisone Place 1 application rectally daily as needed.   propranolol ER 120 MG 24 hr capsule Commonly known as: INDERAL LA Take 1 capsule (120 mg total) by mouth daily. What changed: medication strength   sertraline 100 MG tablet Commonly known as: ZOLOFT Take 150 mg by mouth daily.      Allergies  Allergen Reactions   Hydrocodeine [Dihydrocodeine] Shortness Of Breath and Nausea And Vomiting   Hydrocodone-Acetaminophen Shortness Of Breath and Nausea Only   Coreg [Carvedilol]     Headache    Labetalol Other (See Comments)    Head itches   Follow-up Information    [facility]. Schedule an appointment as soon as possible for a visit in 4 week(s).   Specialty: Neurology Contact information: [place]. Call in 1 day(s).   Why: Please call for a post hospital follow-up  appointment. Contact information: [address] [place] [phone]           The results of significant diagnostics from this hospitalization (including imaging, microbiology, ancillary and laboratory) are listed below for reference.    Significant Diagnostic Studies: Ct Code Stroke Cta Head W/wo Contrast  Result Date: 12/21/2018 CLINICAL DATA:  Left-sided weakness EXAM: CT ANGIOGRAPHY HEAD AND NECK CT PERFUSION BRAIN TECHNIQUE: Multidetector CT imaging of the head and neck was performed using the standard protocol during bolus administration of intravenous contrast. Multiplanar CT image reconstructions and MIPs were obtained to evaluate the vascular anatomy. Carotid stenosis measurements (when applicable) are obtained utilizing NASCET criteria, using  the distal internal carotid diameter as the denominator. Multiphase CT imaging of the brain was performed following IV bolus contrast injection. Subsequent parametric perfusion maps were calculated using RAPID software. CONTRAST:  141mL OMNIPAQUE IOHEXOL 350 MG/ML SOLN COMPARISON:  Noncontrast head CT from earlier today FINDINGS: CTA NECK FINDINGS Aortic arch: Normal Right carotid system: Vessels are smooth and widely patent. No atheromatous changes. Left carotid system: Vessels are smooth and widely patent. No atheromatous changes. Vertebral arteries: No proximal subclavian stenosis. Scant atheromatous changes of the proximal left subclavian on coronal reformats. Both vertebral arteries are smooth and widely patent to the dura. Skeleton: Under pneumatized mastoids. There is partial left mastoid and middle ear opacification. Other neck: No evidence of mass or adenopathy. Upper chest: Negative Review of the MIP images confirms the above findings CTA HEAD FINDINGS Anterior circulation: Vessels are smooth and widely patent. No aneurysm. Posterior circulation: Vessels are smooth and widely patent. Negative for aneurysm.  Venous sinuses: Patent Anatomic variants: Fetal type left PCA Delayed phase: Not obtained Review of the MIP images confirms the above findings CT Brain Perfusion Findings: No abnormal perfusion to correlate with symptoms. Left fronto temporal highlighting on maps is attributed to motion artifact and does not correlate with left-sided weakness. IMPRESSION: 1. Negative CTA of head and neck. 2. Negative CT perfusion when allowing for motion artifact. Electronically Signed   By: Monte Fantasia M.D.   On: 12/21/2018 11:48   Ct Code Stroke Cta Neck W/wo Contrast  Result Date: 12/21/2018 CLINICAL DATA:  Left-sided weakness EXAM: CT ANGIOGRAPHY HEAD AND NECK CT PERFUSION BRAIN TECHNIQUE: Multidetector CT imaging of the head and neck was performed using the standard protocol during bolus administration of intravenous contrast. Multiplanar CT image reconstructions and MIPs were obtained to evaluate the vascular anatomy. Carotid stenosis measurements (when applicable) are obtained utilizing NASCET criteria, using the distal internal carotid diameter as the denominator. Multiphase CT imaging of the brain was performed following IV bolus contrast injection. Subsequent parametric perfusion maps were calculated using RAPID software. CONTRAST:  138mL OMNIPAQUE IOHEXOL 350 MG/ML SOLN COMPARISON:  Noncontrast head CT from earlier today FINDINGS: CTA NECK FINDINGS Aortic arch: Normal Right carotid system: Vessels are smooth and widely patent. No atheromatous changes. Left carotid system: Vessels are smooth and widely patent. No atheromatous changes. Vertebral arteries: No proximal subclavian stenosis. Scant atheromatous changes of the proximal left subclavian on coronal reformats. Both vertebral arteries are smooth and widely patent to the dura. Skeleton: Under pneumatized mastoids. There is partial left mastoid and middle ear opacification. Other neck: No evidence of mass or adenopathy. Upper chest: Negative Review of the MIP  images confirms the above findings CTA HEAD FINDINGS Anterior circulation: Vessels are smooth and widely patent. No aneurysm. Posterior circulation: Vessels are smooth and widely patent. Negative for aneurysm. Venous sinuses: Patent Anatomic variants: Fetal type left PCA Delayed phase: Not obtained Review of the MIP images confirms the above findings CT Brain Perfusion Findings: No abnormal perfusion to correlate with symptoms. Left fronto temporal highlighting on maps is attributed to motion artifact and does not correlate with left-sided weakness. IMPRESSION: 1. Negative CTA of head and neck. 2. Negative CT perfusion when allowing for motion artifact. Electronically Signed   By: Monte Fantasia M.D.   On: 12/21/2018 11:48   Mr Brain Wo Contrast  Result Date: 12/22/2018 CLINICAL DATA:  Slurred speech and left-sided weakness EXAM: MRI HEAD WITHOUT CONTRAST TECHNIQUE: Multiplanar, multiecho pulse sequences of the brain and surrounding structures were obtained without intravenous  contrast. COMPARISON:  CTA head neck 12/21/2018 FINDINGS: BRAIN: There is an area abnormal diffusion restriction within the ventral right pons. The area measures series approximately 1.6 x 0.8 cm. The midline structures are normal. The white matter signal is normal for the patient's age. The cerebral and cerebellar volume are age-appropriate. No hydrocephalus. Susceptibility-sensitive sequences show no chronic microhemorrhage or superficial siderosis. No midline shift or other mass effect. VASCULAR: The major intracranial arterial and venous sinus flow voids are normal. SKULL AND UPPER CERVICAL SPINE: Calvarial bone marrow signal is normal. There is no skull base mass. Visualized upper cervical spine and soft tissues are normal. SINUSES/ORBITS: No fluid levels or advanced mucosal thickening. No mastoid or middle ear effusion. The orbits are normal. IMPRESSION: 1. Acute infarct of the ventral right pons, in keeping with the reported  left-sided weakness. 2. No hemorrhage or mass effect. Electronically Signed   By: Ulyses Jarred M.D.   On: 12/22/2018 03:38   Ct Code Stroke Cta Cerebral Perfusion W/wo Contrast  Result Date: 12/21/2018 CLINICAL DATA:  Left-sided weakness EXAM: CT ANGIOGRAPHY HEAD AND NECK CT PERFUSION BRAIN TECHNIQUE: Multidetector CT imaging of the head and neck was performed using the standard protocol during bolus administration of intravenous contrast. Multiplanar CT image reconstructions and MIPs were obtained to evaluate the vascular anatomy. Carotid stenosis measurements (when applicable) are obtained utilizing NASCET criteria, using the distal internal carotid diameter as the denominator. Multiphase CT imaging of the brain was performed following IV bolus contrast injection. Subsequent parametric perfusion maps were calculated using RAPID software. CONTRAST:  161mL OMNIPAQUE IOHEXOL 350 MG/ML SOLN COMPARISON:  Noncontrast head CT from earlier today FINDINGS: CTA NECK FINDINGS Aortic arch: Normal Right carotid system: Vessels are smooth and widely patent. No atheromatous changes. Left carotid system: Vessels are smooth and widely patent. No atheromatous changes. Vertebral arteries: No proximal subclavian stenosis. Scant atheromatous changes of the proximal left subclavian on coronal reformats. Both vertebral arteries are smooth and widely patent to the dura. Skeleton: Under pneumatized mastoids. There is partial left mastoid and middle ear opacification. Other neck: No evidence of mass or adenopathy. Upper chest: Negative Review of the MIP images confirms the above findings CTA HEAD FINDINGS Anterior circulation: Vessels are smooth and widely patent. No aneurysm. Posterior circulation: Vessels are smooth and widely patent. Negative for aneurysm. Venous sinuses: Patent Anatomic variants: Fetal type left PCA Delayed phase: Not obtained Review of the MIP images confirms the above findings CT Brain Perfusion Findings: No  abnormal perfusion to correlate with symptoms. Left fronto temporal highlighting on maps is attributed to motion artifact and does not correlate with left-sided weakness. IMPRESSION: 1. Negative CTA of head and neck. 2. Negative CT perfusion when allowing for motion artifact. Electronically Signed   By: Monte Fantasia M.D.   On: 12/21/2018 11:48   Ct Head Code Stroke Wo Contrast  Result Date: 12/21/2018 CLINICAL DATA:  Code stroke. Left-sided weakness. Last seen normal last night EXAM: CT HEAD WITHOUT CONTRAST TECHNIQUE: Contiguous axial images were obtained from the base of the skull through the vertex without intravenous contrast. COMPARISON:  05/22/2016 FINDINGS: Brain: No evidence of acute infarction, hemorrhage, hydrocephalus, extra-axial collection or mass lesion/mass effect. Vascular: No hyperdense vessel or unexpected calcification. Skull: Normal. Negative for fracture or focal lesion. Sinuses/Orbits: No acute finding. Other: These results were called by telephone at the time of interpretation on 12/21/2018 at 11:22 am to Dr. Noemi Chapel , who verbally acknowledged these results. ASPECTS [facility] Stroke Program Early CT Score) -  Ganglionic level infarction (caudate, lentiform nuclei, internal capsule, insula, M1-M3 cortex): 7 - Supraganglionic infarction (M4-M6 cortex): 3 Total score (0-10 with 10 being normal): 10 IMPRESSION: Negative. ASPECTS is 10. Electronically Signed   By: Monte Fantasia M.D.   On: 12/21/2018 11:24   Vas [place] Lower Extremity Venous (dvt)  Result Date: 12/22/2018  Lower Venous Study Indications: Stroke.  Limitations: Depth of vessels. Comparison Study: No prior study on file for comparison Performing Technologist: Sharion Dove RVS  Examination Guidelines: A complete evaluation includes B-mode imaging, spectral Doppler, color Doppler, and power Doppler as needed of all accessible portions of each vessel. Bilateral testing is considered an integral part of a complete  examination. Limited examinations for reoccurring indications may be performed as noted.  +---------+---------------+---------+-----------+----------+-------+  RIGHT     Compressibility Phasicity Spontaneity Properties Summary  +---------+---------------+---------+-----------+----------+-------+  CFV       Full            Yes       Yes                             +---------+---------------+---------+-----------+----------+-------+  SFJ       Full                                                      +---------+---------------+---------+-----------+----------+-------+  FV Prox   Full                                                      +---------+---------------+---------+-----------+----------+-------+  FV Mid    Full                                                      +---------+---------------+---------+-----------+----------+-------+  FV Distal Full                                                      +---------+---------------+---------+-----------+----------+-------+  PFV       Full                                                      +---------+---------------+---------+-----------+----------+-------+  POP       Full            Yes       Yes                             +---------+---------------+---------+-----------+----------+-------+  PTV       Full                                                      +---------+---------------+---------+-----------+----------+-------+  PERO      Full                                                      +---------+---------------+---------+-----------+----------+-------+   +---------+---------------+---------+-----------+----------+-------+  LEFT      Compressibility Phasicity Spontaneity Properties Summary  +---------+---------------+---------+-----------+----------+-------+  CFV       Full            Yes       Yes                             +---------+---------------+---------+-----------+----------+-------+  SFJ       Full                                                       +---------+---------------+---------+-----------+----------+-------+  FV Prox   Full                                                      +---------+---------------+---------+-----------+----------+-------+  FV Mid    Full                                                      +---------+---------------+---------+-----------+----------+-------+  FV Distal Full                                                      +---------+---------------+---------+-----------+----------+-------+  PFV       Full                                                      +---------+---------------+---------+-----------+----------+-------+  POP       Full            Yes       Yes                             +---------+---------------+---------+-----------+----------+-------+  PTV       Full                                                      +---------+---------------+---------+-----------+----------+-------+  PERO      Full                                                      +---------+---------------+---------+-----------+----------+-------+  Summary: Right: There is no evidence of deep vein thrombosis in the lower extremity. Left: There is no evidence of deep vein thrombosis in the lower extremity.  *See table(s) above for measurements and observations.    Preliminary     Microbiology: Recent Results (from the past 240 hour(s))  Culture, Urine     Status: Abnormal   Collection Time: 12/21/18 12:54 PM   Specimen: Urine, Clean Catch  Result Value Ref Range Status   Specimen Description   Final    URINE, CLEAN CATCH Performed at [facility], [address], [place], [place] [address]    Special Requests   Final    NONE Performed at [facility], [address], [place], [place] [address]    Culture MULTIPLE SPECIES PRESENT, SUGGEST RECOLLECTION (A)  Final   Report Status 12/23/2018 FINAL  Final  SARS Coronavirus 2 (CEPHEID - Performed in [place] hospital lab), Hosp Order     Status: None   Collection Time:  12/21/18  1:33 PM   Specimen: Nasopharyngeal Swab  Result Value Ref Range Status   SARS Coronavirus 2 NEGATIVE NEGATIVE Final    Comment: (NOTE) If result is NEGATIVE SARS-CoV-2 target nucleic acids are NOT DETECTED. The SARS-CoV-2 RNA is generally detectable in upper and lower  respiratory specimens during the acute phase of infection. The lowest  concentration of SARS-CoV-2 viral copies this assay can detect is 250  copies / mL. A negative result does not preclude SARS-CoV-2 infection  and should not be used as the sole basis for treatment or other  patient management decisions.  A negative result may occur with  improper specimen collection / handling, submission of specimen other  than nasopharyngeal swab, presence of viral mutation(s) within the  areas targeted by this assay, and inadequate number of viral copies  (<250 copies / mL). A negative result must be combined with clinical  observations, patient history, and epidemiological information. If result is POSITIVE SARS-CoV-2 target nucleic acids are DETECTED. The SARS-CoV-2 RNA is generally detectable in upper and lower  respiratory specimens dur ing the acute phase of infection.  Positive  results are indicative of active infection with SARS-CoV-2.  Clinical  correlation with patient history and other diagnostic information is  necessary to determine patient infection status.  Positive results do  not rule out bacterial infection or co-infection with other viruses. If result is PRESUMPTIVE POSTIVE SARS-CoV-2 nucleic acids MAY BE PRESENT.   A presumptive positive result was obtained on the submitted specimen  and confirmed on repeat testing.  While 2019 novel coronavirus  (SARS-CoV-2) nucleic acids may be present in the submitted sample  additional confirmatory testing may be necessary for epidemiological  and / or clinical management purposes  to differentiate between  SARS-CoV-2 and other Sarbecovirus currently known to  infect humans.  If clinically indicated additional testing with an alternate test  methodology [phone]) is advised. The SARS-CoV-2 RNA is generally  detectable in upper and lower respiratory sp ecimens during the acute  phase of infection. The expected result is Negative. Fact Sheet for Patients:  StrictlyIdeas.no Fact Sheet for Healthcare Providers: BankingDealers.co.za This test is not yet approved or cleared by the [place] FDA and has been authorized for detection and/or diagnosis of SARS-CoV-2 by FDA under an Emergency Use Authorization (EUA).  This EUA will remain in effect (meaning this test can be used) for the duration of the COVID-19 declaration under Section 564(b)(1) of the Act, 21 U.S.C. section 360bbb-3(b)(1), unless the authorization  is terminated or revoked sooner. Performed at [facility], [address] [place],  [address]      Labs: Basic Metabolic Panel: Recent Labs  Lab 12/21/18 1121 12/21/18 1808 12/22/18 0634 12/23/18 0514  NA 139  --  137 139  K 4.0  --  3.3* 3.8  CL 103  --  105 110  CO2 25  --  20* 20*  GLUCOSE 129*  --  101* 82  BUN 14  --  12 12  CREATININE 0.71 0.67 0.79 0.74  CALCIUM 9.8  --  8.9 9.2   Liver Function Tests: Recent Labs  Lab 12/21/18 1121 12/22/18 0634 12/23/18 0514  AST 13* 12* 10*  ALT 15 15 14   ALKPHOS 66 52 58  BILITOT 0.3 0.6 0.7  PROT 8.1 6.5 6.7  ALBUMIN 4.6 3.5 3.9   No results for input(s): LIPASE, AMYLASE in the last 168 hours. No results for input(s): AMMONIA in the last 168 hours. CBC: Recent Labs  Lab 12/21/18 1121 12/21/18 1808 12/22/18 0634 12/23/18 0514  WBC 9.7 11.6* 10.6* 10.3  NEUTROABS 6.8  --   --   --   HGB 11.8* 12.0 10.5* 11.0*  HCT 38.5 37.9 33.3* 35.4*  MCV 88.3 84.8 84.5 85.5  PLT 305 335 291 292   Cardiac Enzymes: No results for input(s): CKTOTAL, CKMB, CKMBINDEX, TROPONINI in the last 168 hours. BNP: BNP (last 3  results) No results for input(s): BNP in the last 8760 hours.  ProBNP (last 3 results) No results for input(s): PROBNP in the last 8760 hours.  CBG: No results for input(s): GLUCAP in the last 168 hours.     Signed:  Kayleen Memos, MD [facility] 12/23/2018, 8:51 AM

## 2018-12-23 NOTE — Progress Notes (Signed)
Patient discharged home. Education and information given to patient. IV removed. CCMD notified. Patient leaving unit via wheelchair.

## 2018-12-23 NOTE — Discharge Instructions (Signed)
Eating Plan After Stroke A stroke causes damage to the brain cells, which can affect your ability to walk, talk, and even eat. The impact of a stroke is different for everyone, and so is recovery. A good nutrition plan is important for your recovery. It can also lower your risk of another stroke. If you have difficulty chewing and swallowing your food, a dietitian or your stroke care team can help so that you can enjoy eating healthy foods. What are tips for following this plan?  Reading food labels  Choose foods that have less than 300 milligrams (mg) of sodium per serving. Limit your sodium intake to less than 1,500 mg per day.  Avoid foods that have saturated fat and trans fat.  Choose foods that are low in cholesterol. Limit the amount of cholesterol you eat each day to less than 200 mg.  Choose foods that are high in fiber. Eat 20-30 grams (g) of fiber each day.  Avoid foods with added sugar. Check the food label for ingredients such as sugar, corn syrup, honey, fructose, molasses, and cane juice. Shopping  At the grocery store, buy most of your food from areas near the walls of the store. This includes: ? Fresh fruits and vegetables. ? Dry grains, beans, nuts, and seeds. ? Fresh seafood, poultry, lean meats, and eggs. ? Low-fat dairy products.  Buy whole ingredients instead of prepackaged foods.  Buy fresh, in-season fruits and vegetables from local farmers markets.  Buy frozen fruits and vegetables in resealable bags. Cooking  Prepare foods with very little salt. Use herbs or salt-free spices instead.  Cook with heart-healthy oils, such as olive, avocado, canola, soybean, or sunflower oil.  Avoid frying foods. Bake, grill, or broil foods instead.  Remove visible fat and skin from meat and poultry before eating.  Modify food textures as told by your health care provider. Meal planning  Eat a wide variety of colorful fruits and vegetables. Make sure one-half of your  plate is filled with fruits and vegetables at each meal.  Eat fruits and vegetables that are high in potassium, such as: ? Apples, bananas, oranges, and melon. ? Sweet potatoes, spinach, zucchini, and tomatoes.  Eat fish that contain heart-healthy fats (omega-3 fats) at least twice a week. These include salmon, tuna, mackerel, and sardines.  Eat plant foods that are high in omega-3 fats, such as flaxseeds and walnuts. Add these to cereals, yogurt, or pasta dishes.  Eat several servings of high-fiber foods each day, such as fruits, vegetables, whole grains, and beans.  Do not put salt at the table for meals.  When eating out at restaurants: ? Ask the server about low-salt or salt-free food options. ? Avoid fried foods. Look for menu items that are grilled, steamed, broiled, or roasted. ? Ask if your food can be prepared without butter. ? Ask for condiments, such as salad dressings, gravy, or sauces to be served on the side.  If you have difficulty swallowing: ? Choose foods that are softer and easier to chew and swallow. ? Cut foods into small pieces and chew well before swallowing. ? Thicken liquids as told by your health care provider or dietitian. ? Let your health care provider know if your condition does not improve over time. You may need to work with a speech therapist to re-train the muscles that are used for eating. General recommendations  Involve your family and friends in your recovery, if possible. It may be helpful to have a slower meal time  and to plan meals that include foods everyone in the family can eat.  Brush your teeth with fluoride toothpaste twice a day, and floss once a day. Keeping a clean mouth can help you swallow and can also help your appetite.  Drink enough water each day to keep your urine pale yellow. If needed, set reminders or ask your family to help you remember to drink water.  Limit alcohol intake to no more than 1 drink a day for nonpregnant  women and 2 drinks a day for men. One drink equals 12 oz of beer, 5 oz of wine, or 1 oz of hard liquor. Summary  Following this eating plan can help in your stroke recovery and can decrease your risk for another stroke.  Let your health care provider know if you have problems with swallowing. You may need to work with a speech therapist. This information is not intended to replace advice given to you by your health care provider. Make sure you discuss any questions you have with your health care provider. Document Released: 08/20/2017 Document Revised: 10/03/2018 Document Reviewed: 08/20/2017 Elsevier Patient Education  2020 [employer].   Hypertension, Adult Hypertension is another name for high blood pressure. High blood pressure forces your heart to work harder to pump blood. This can cause problems over time. There are two numbers in a blood pressure reading. There is a top number (systolic) over a bottom number (diastolic). It is best to have a blood pressure that is below 120/80. Healthy choices can help lower your blood pressure, or you may need medicine to help lower it. What are the causes? The cause of this condition is not known. Some conditions may be related to high blood pressure. What increases the risk?  Smoking.  Having type 2 diabetes mellitus, high cholesterol, or both.  Not getting enough exercise or physical activity.  Being overweight.  Having too much fat, sugar, calories, or salt (sodium) in your diet.  Drinking too much alcohol.  Having long-term (chronic) kidney disease.  Having a family history of high blood pressure.  Age. Risk increases with age.  Race. You may be at higher risk if you are African American.  Gender. Men are at higher risk than women before age 36. After age 30, women are at higher risk than men.  Having obstructive sleep apnea.  Stress. What are the signs or symptoms?  High blood pressure may not cause symptoms. Very high  blood pressure (hypertensive crisis) may cause: ? Headache. ? Feelings of worry or nervousness (anxiety). ? Shortness of breath. ? Nosebleed. ? A feeling of being sick to your stomach (nausea). ? Throwing up (vomiting). ? Changes in how you see. ? Very bad chest pain. ? Seizures. How is this treated?  This condition is treated by making healthy lifestyle changes, such as: ? Eating healthy foods. ? Exercising more. ? Drinking less alcohol.  Your health care provider may prescribe medicine if lifestyle changes are not enough to get your blood pressure under control, and if: ? Your top number is above 130. ? Your bottom number is above 80.  Your personal target blood pressure may vary. Follow these instructions at home: Eating and drinking   If told, follow the DASH eating plan. To follow this plan: ? Fill one half of your plate at each meal with fruits and vegetables. ? Fill one fourth of your plate at each meal with whole grains. Whole grains include whole-wheat pasta, brown rice, and whole-grain bread. ?  Eat or drink low-fat dairy products, such as skim milk or low-fat yogurt. ? Fill one fourth of your plate at each meal with low-fat (lean) proteins. Low-fat proteins include fish, chicken without skin, eggs, beans, and tofu. ? Avoid fatty meat, cured and processed meat, or chicken with skin. ? Avoid pre-made or processed food.  Eat less than 1,500 mg of salt each day.  Do not drink alcohol if: ? Your doctor tells you not to drink. ? You are pregnant, may be pregnant, or are planning to become pregnant.  If you drink alcohol: ? Limit how much you use to:  0-1 drink a day for women.  0-2 drinks a day for men. ? Be aware of how much alcohol is in your drink. In the [place] one drink equals one 12 oz bottle of beer (355 mL), one 5 oz glass of wine (148 mL), or one 1 oz glass of hard liquor (44 mL). Lifestyle   Work with your doctor to stay at a healthy weight or to lose  weight. Ask your doctor what the best weight is for you.  Get at least 30 minutes of exercise most days of the week. This may include walking, swimming, or biking.  Get at least 30 minutes of exercise that strengthens your muscles (resistance exercise) at least 3 days a week. This may include lifting weights or doing Pilates.  Do not use any products that contain nicotine or tobacco, such as cigarettes, e-cigarettes, and chewing tobacco. If you need help quitting, ask your doctor.  Check your blood pressure at home as told by your doctor.  Keep all follow-up visits as told by your doctor. This is important. Medicines  Take over-the-counter and prescription medicines only as told by your doctor. Follow directions carefully.  Do not skip doses of blood pressure medicine. The medicine does not work as well if you skip doses. Skipping doses also puts you at risk for problems.  Ask your doctor about side effects or reactions to medicines that you should watch for. Contact a doctor if you:  Think you are having a reaction to the medicine you are taking.  Have headaches that keep coming back (recurring).  Feel dizzy.  Have swelling in your ankles.  Have trouble with your vision. Get help right away if you:  Get a very bad headache.  Start to feel mixed up (confused).  Feel weak or numb.  Feel faint.  Have very bad pain in your: ? Chest. ? Belly (abdomen).  Throw up more than once.  Have trouble breathing. Summary  Hypertension is another name for high blood pressure.  High blood pressure forces your heart to work harder to pump blood.  For most people, a normal blood pressure is less than 120/80.  Making healthy choices can help lower blood pressure. If your blood pressure does not get lower with healthy choices, you may need to take medicine. This information is not intended to replace advice given to you by your health care provider. Make sure you discuss any  questions you have with your health care provider. Document Released: 11/29/2007 Document Revised: 02/20/2018 Document Reviewed: 02/20/2018 Elsevier Patient Education  2020 [place] Your Hypertension Hypertension is commonly called high blood pressure. This is when the force of your blood pressing against the walls of your arteries is too strong. Arteries are blood vessels that carry blood from your heart throughout your body. Hypertension forces the heart to work harder to pump blood,  and may cause the arteries to become narrow or stiff. Having untreated or uncontrolled hypertension can cause heart attack, stroke, kidney disease, and other problems. What are blood pressure readings? A blood pressure reading consists of a higher number over a lower number. Ideally, your blood pressure should be below 120/80. The first ("top") number is called the systolic pressure. It is a measure of the pressure in your arteries as your heart beats. The second ("bottom") number is called the diastolic pressure. It is a measure of the pressure in your arteries as the heart relaxes. What does my blood pressure reading mean? Blood pressure is classified into four stages. Based on your blood pressure reading, your health care provider may use the following stages to determine what type of treatment you need, if any. Systolic pressure and diastolic pressure are measured in a unit called mm Hg. Normal  Systolic pressure: below 706.  Diastolic pressure: below 80. Elevated  Systolic pressure: 237-628.  Diastolic pressure: below 80. Hypertension stage 1  Systolic pressure: 315-176.  Diastolic pressure: 16-07. Hypertension stage 2  Systolic pressure: 371 or above.  Diastolic pressure: 90 or above. What health risks are associated with hypertension? Managing your hypertension is an important responsibility. Uncontrolled hypertension can lead to:  A heart attack.  A stroke.  A weakened  blood vessel (aneurysm).  Heart failure.  Kidney damage.  Eye damage.  Metabolic syndrome.  Memory and concentration problems. What changes can I make to manage my hypertension? Hypertension can be managed by making lifestyle changes and possibly by taking medicines. Your health care provider will help you make a plan to bring your blood pressure within a normal range. Eating and drinking   Eat a diet that is high in fiber and potassium, and low in salt (sodium), added sugar, and fat. An example eating plan is called the DASH (Dietary Approaches to Stop Hypertension) diet. To eat this way: ? Eat plenty of fresh fruits and vegetables. Try to fill half of your plate at each meal with fruits and vegetables. ? Eat whole grains, such as whole wheat pasta, brown rice, or whole grain bread. Fill about one quarter of your plate with whole grains. ? Eat low-fat diary products. ? Avoid fatty cuts of meat, processed or cured meats, and poultry with skin. Fill about one quarter of your plate with lean proteins such as fish, chicken without skin, beans, eggs, and tofu. ? Avoid premade and processed foods. These tend to be higher in sodium, added sugar, and fat.  Reduce your daily sodium intake. Most people with hypertension should eat less than 1,500 mg of sodium a day.  Limit alcohol intake to no more than 1 drink a day for nonpregnant women and 2 drinks a day for men. One drink equals 12 oz of beer, 5 oz of wine, or 1 oz of hard liquor. Lifestyle  Work with your health care provider to maintain a healthy body weight, or to lose weight. Ask what an ideal weight is for you.  Get at least 30 minutes of exercise that causes your heart to beat faster (aerobic exercise) most days of the week. Activities may include walking, swimming, or biking.  Include exercise to strengthen your muscles (resistance exercise), such as weight lifting, as part of your weekly exercise routine. Try to do these types of  exercises for 30 minutes at least 3 days a week.  Do not use any products that contain nicotine or tobacco, such as cigarettes and e-cigarettes. If  you need help quitting, ask your health care provider.  Control any long-term (chronic) conditions you have, such as high cholesterol or diabetes. Monitoring  Monitor your blood pressure at home as told by your health care provider. Your personal target blood pressure may vary depending on your medical conditions, your age, and other factors.  Have your blood pressure checked regularly, as often as told by your health care provider. Working with your health care provider  Review all the medicines you take with your health care provider because there may be side effects or interactions.  Talk with your health care provider about your diet, exercise habits, and other lifestyle factors that may be contributing to hypertension.  Visit your health care provider regularly. Your health care provider can help you create and adjust your plan for managing hypertension. Will I need medicine to control my blood pressure? Your health care provider may prescribe medicine if lifestyle changes are not enough to get your blood pressure under control, and if:  Your systolic blood pressure is 130 or higher.  Your diastolic blood pressure is 80 or higher. Take medicines only as told by your health care provider. Follow the directions carefully. Blood pressure medicines must be taken as prescribed. The medicine does not work as well when you skip doses. Skipping doses also puts you at risk for problems. Contact a health care provider if:  You think you are having a reaction to medicines you have taken.  You have repeated (recurrent) headaches.  You feel dizzy.  You have swelling in your ankles.  You have trouble with your vision. Get help right away if:  You develop a severe headache or confusion.  You have unusual weakness or numbness, or you feel  faint.  You have severe pain in your chest or abdomen.  You vomit repeatedly.  You have trouble breathing. Summary  Hypertension is when the force of blood pumping through your arteries is too strong. If this condition is not controlled, it may put you at risk for serious complications.  Your personal target blood pressure may vary depending on your medical conditions, your age, and other factors. For most people, a normal blood pressure is less than 120/80.  Hypertension is managed by lifestyle changes, medicines, or both. Lifestyle changes include weight loss, eating a healthy, low-sodium diet, exercising more, and limiting alcohol. This information is not intended to replace advice given to you by your health care provider. Make sure you discuss any questions you have with your health care provider. Document Released: 03/06/2012 Document Revised: 10/04/2018 Document Reviewed: 05/10/2016 Elsevier Patient Education  2020 [employer].

## 2018-12-24 LAB — VITAMIN D 25 HYDROXY (VIT D DEFICIENCY, FRACTURES): Vit D, 25-Hydroxy: 29.7 ng/mL — ABNORMAL LOW (ref 30.0–100.0)

## 2019-02-02 ENCOUNTER — Other Ambulatory Visit: Payer: Self-pay | Admitting: Gastroenterology

## 2019-02-02 DIAGNOSIS — K2950 Unspecified chronic gastritis without bleeding: Secondary | ICD-10-CM

## 2019-02-04 ENCOUNTER — Other Ambulatory Visit: Payer: Self-pay | Admitting: *Deleted

## 2019-02-04 DIAGNOSIS — Z20822 Contact with and (suspected) exposure to covid-19: Secondary | ICD-10-CM

## 2019-02-05 ENCOUNTER — Other Ambulatory Visit: Payer: Self-pay

## 2019-02-05 ENCOUNTER — Inpatient Hospital Stay: Payer: BC Managed Care – PPO | Admitting: Adult Health

## 2019-02-05 ENCOUNTER — Encounter: Payer: Self-pay | Admitting: Adult Health

## 2019-02-05 LAB — NOVEL CORONAVIRUS, NAA: SARS-CoV-2, NAA: NOT DETECTED

## 2019-02-05 NOTE — Progress Notes (Deleted)
[facility] [address] [place]. [place] [address] [phone]       HOSPITAL FOLLOW UP NOTE  [name] Date of Birth:  1981/03/26 Medical Record Number:  [id number]   Reason for Referral:  hospital stroke follow up    CHIEF COMPLAINT:  No chief complaint on file.   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  History obtained from *** and chart review. Reviewed all radiology images and labs personally.  [name] is a 38 y.o. female with history of anxiety, OSA, HTN, HLD, GERD, and current tobacco use  who presented to [facility] ED on 12/21/2018 with left sided weakness, dysarthria, facial droop and BP 204/126. She did not receive IV t-PA due to late presentation (>4.5 hours from time of onset).  Stroke work-up revealed acute infarct of the ventral right pons as evidenced on MRI likely secondary to small vessel disease with resultant slight left arm dysmetria.  Vessel imaging and 2D echo unremarkable.  Lower extremity venous Dopplers negative for DVT.  Recommended 3 weeks DAPT with aspirin alone for secondary stroke prevention.  Current tobacco user smoking cessation counseling provided.  History of OCP use which was discontinued 2 months prior.  BP on admission 204/126 and remained on high and throughout admission therefore initiated amlodipine 10 mg daily and recommend follow-up outpatient with PCP for ongoing monitoring/management.  LDL 146 and initiated atorvastatin 40 mg daily.  A1c 5.3.  Other stroke risk factors include overweight (BMI 29.95) and OSA.  Patient is discharged home in stable condition without therapy needs.    ROS:   14 system review of systems performed and negative with exception of ***  PMH:  Past Medical History:  Diagnosis Date  . Anxiety   . Deviated septum   . GERD (gastroesophageal reflux disease)    . History of bilateral salpingectomy   . Hypertension   . Renal stones   . Sleep apnea    Doesn't use everyday    PSH:  Past Surgical History:  Procedure Laterality Date  . BILATERAL SALPINGECTOMY  07.2016  . CYSTOSCOPY WITH RETROGRADE PYELOGRAM, URETEROSCOPY AND STENT PLACEMENT Right 07/29/2015   Procedure: CYSTOSCOPY WITH RIGHT RETROGRADE PYELOGRAM, RIGHT URETEROSCOPY AND STENT PLACEMENT;  Surgeon: Irine Seal, MD;  Location: [facility];  Service: Urology;  Laterality: Right;  . ECTOPIC PREGNANCY SURGERY  10/2013  . HOLMIUM LASER APPLICATION Right 12/29/2829   Procedure: HOLMIUM LASER APPLICATION;  Surgeon: Irine Seal, MD;  Location: [facility];  Service: Urology;  Laterality: Right;  . NASAL SEPTUM SURGERY     doen along with tonsillectomy   . TONSILLECTOMY    . TYMPANOSTOMY     x 3    Social History:  Social History   Socioeconomic History  . Marital status: Married    Spouse name: Not on file  . Number of children: Not on file  . Years of education: Not on file  . Highest education level: Not on file  Occupational History  . Not on file  Social Needs  . Financial resource strain: Not on file  . Food insecurity    Worry: Not on file    Inability: Not on file  . Transportation needs    Medical: Not on file    Non-medical: Not on file  Tobacco Use  . Smoking status: Current Every Day Smoker    Packs/day: 0.25  Years: 14.00    Pack years: 3.50    Types: Cigarettes    Start date: 10/04/1997    Last attempt to quit: 06/10/2014    Years since quitting: 4.6  . Smokeless tobacco: Never Used  Substance and Sexual Activity  . Alcohol use: No  . Drug use: No  . Sexual activity: Not on file  Lifestyle  . Physical activity    Days per week: Not on file    Minutes per session: Not on file  . Stress: Not on file  Relationships  . Social Herbalist on phone: Not on file    Gets together: Not on file    Attends religious service: Not on file    Active member of  club or organization: Not on file    Attends meetings of clubs or organizations: Not on file    Relationship status: Not on file  . Intimate partner violence    Fear of current or ex partner: Not on file    Emotionally abused: Not on file    Physically abused: Not on file    Forced sexual activity: Not on file  Other Topics Concern  . Not on file  Social History Narrative  . Not on file    Family History:  Family History  Problem Relation Age of Onset  . Hypertension Mother   . Hypertension Father   . Hyperlipidemia Father   . Diabetes Brother   . Diabetes Other     Medications:   Current Outpatient Medications on File Prior to Visit  Medication Sig Dispense Refill  . ALPRAZolam (XANAX) 1 MG tablet Take 0.5-1 tablets by mouth 3 (three) times daily as needed for anxiety.  1  . amLODipine (NORVASC) 10 MG tablet Take 1 tablet (10 mg total) by mouth daily. 30 tablet 0  . aspirin EC 81 MG EC tablet Take 1 tablet (81 mg total) by mouth daily. 120 tablet 0  . atorvastatin (LIPITOR) 40 MG tablet Take 1 tablet (40 mg total) by mouth daily at 6 PM. 60 tablet 0  . fluticasone (FLONASE) 50 MCG/ACT nasal spray Place 2 sprays into both nostrils daily as needed.    . hydrocortisone (PROCTO-MED HC) 2.5 % rectal cream Place 1 application rectally daily as needed.    [name] Kitchen levothyroxine (SYNTHROID, LEVOTHROID) 50 MCG tablet TAKE ONE TABLET BY MOUTH EVERY MORNING ON AN EMPTY STOMACH    . losartan-hydrochlorothiazide (HYZAAR) 50-12.5 MG tablet TAKE ONE TABLET BY MOUTH ONCE DAILY    . nicotine (NICODERM CQ - DOSED IN MG/24 HOURS) 21 mg/24hr patch Place 1 patch (21 mg total) onto the skin daily. 28 patch 0  . ondansetron (ZOFRAN ODT) 4 MG disintegrating tablet Take 1 tablet (4 mg total) by mouth every 6 (six) hours as needed for nausea or vomiting. 120 tablet 2  . pantoprazole (PROTONIX) 40 MG tablet TAKE 1 TABLET BY MOUTH TWICE DAILY BEFORE BREAKFAST AND DINNER 180 tablet 0  . propranolol ER (INDERAL  LA) 120 MG 24 hr capsule Take 1 capsule (120 mg total) by mouth daily. 30 capsule 0  . sertraline (ZOLOFT) 100 MG tablet Take 150 mg by mouth daily.     No current facility-administered medications on file prior to visit.     Allergies:   Allergies  Allergen Reactions  . Hydrocodeine [Dihydrocodeine] Shortness Of Breath and Nausea And Vomiting  . Hydrocodone-Acetaminophen Shortness Of Breath and Nausea Only  . Coreg [Carvedilol]     Headache   .  Labetalol Other (See Comments)    Head itches     Physical Exam  There were no vitals filed for this visit. There is no height or weight on file to calculate BMI. No exam data present  No flowsheet data found.   General: well developed, well nourished,  pleasant middle-age Caucasian female, seated, in no evident distress Head: head normocephalic and atraumatic.   Neck: supple with no carotid or supraclavicular bruits Cardiovascular: regular rate and rhythm, no murmurs Musculoskeletal: no deformity Skin:  no rash/petichiae Vascular:  Normal pulses all extremities   Neurologic Exam Mental Status: Awake and fully alert. Oriented to place and time. Recent and remote memory intact. Attention span, concentration and fund of knowledge appropriate. Mood and affect appropriate.  Cranial Nerves: Fundoscopic exam reveals sharp disc margins. Pupils equal, briskly reactive to light. Extraocular movements full without nystagmus. Visual fields full to confrontation. Hearing intact. Facial sensation intact. Face, tongue, palate moves normally and symmetrically.  Motor: Normal bulk and tone. Normal strength in all tested extremity muscles. Sensory.: intact to touch , pinprick , position and vibratory sensation.  Coordination: Rapid alternating movements normal in all extremities. Finger-to-nose and heel-to-shin performed accurately bilaterally. Gait and Station: Arises from chair without difficulty. Stance is normal. Gait demonstrates normal stride  length and balance Reflexes: 1+ and symmetric. Toes downgoing.     NIHSS  *** Modified Rankin  ***   Diagnostic Data (Labs, Imaging, Testing)  Ct Code Stroke Cta Head W/wo Contrast  Ct Code Stroke Cta Neck W/wo Contrast Ct Code Stroke Cta Cerebral Perfusion W/wo Contrast 12/21/2018 IMPRESSION:  1. Negative CTA of head and neck.  2. Negative CT perfusion when allowing for motion artifact.   Mr Brain Wo Contrast 12/22/2018 IMPRESSION:  1. Acute infarct of the ventral right pons, in keeping with the reported left-sided weakness.  2. No hemorrhage or mass effect.   Ct Head Code Stroke Wo Contrast 12/21/2018 IMPRESSION:  Negative. ASPECTS is 10.   Transthoracic Echocardiogram  1. The left ventricle has normal systolic function with an ejection fraction of 60-65%. The cavity size was normal. There is mildly increased left ventricular wall thickness. Left ventricular diastolic Doppler parameters are consistent with impaired  relaxation. No evidence of left ventricular regional wall motion abnormalities. 2. The right ventricle has normal systolic function. The cavity was normal. There is no increase in right ventricular wall thickness. 3. No evidence of mitral valve stenosis. Trivial mitral regurgitation. 4. The aortic valve is tricuspid. No stenosis of the aortic valve. 5. The aortic root is normal in size and structure. 6. Normal IVC size. No complete TR doppler jet so unable to estimate PA systolic pressure.   EKG - ST rate 105 BPM. (See cardiology reading for complete details)     ASSESSMENT: Maria Lang is a 38 y.o. 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 Vascular risk factors include HTN, HLD, OSA and tobacco use.     PLAN:  1. Right pontine infarct: Continue aspirin 81  mg daily  and atorvastatin for secondary stroke prevention. Maintain strict control of hypertension with blood pressure goal below 130/90, diabetes with hemoglobin A1c goal below 6.5% and cholesterol with LDL cholesterol (bad cholesterol) goal below 70 mg/dL.  I also advised the patient to eat a healthy diet with plenty of whole grains, cereals, fruits and vegetables, exercise regularly  with at least 30 minutes of continuous activity daily and maintain ideal body weight. 2. HTN: Advised to continue current treatment regimen.  Today's BP ***.  Advised to continue to monitor at home along with continued follow-up with PCP for management 3. HLD: Advised to continue current treatment regimen along with continued follow-up with PCP for future prescribing and monitoring of lipid panel 4. Tobacco use:    Follow up in *** or call earlier if needed   Greater than 50% of time during this 45 minute visit was spent on counseling, explanation of diagnosis of ***, reviewing risk factor management of ***, planning of further management along with potential future management, and discussion with patient and family answering all questions.    Venancio Poisson, AGNP-BC  [facility] [address] [place], [place] [address]  Phone [phone] Fax [phone] Note: This document was prepared with digital dictation and possible smart phrase technology. Any transcriptional errors that result from this process are unintentional.

## 2019-02-13 ENCOUNTER — Other Ambulatory Visit: Payer: Self-pay

## 2019-02-13 DIAGNOSIS — Z20822 Contact with and (suspected) exposure to covid-19: Secondary | ICD-10-CM

## 2019-02-13 NOTE — Progress Notes (Signed)
[id number] 

## 2019-02-14 LAB — NOVEL CORONAVIRUS, NAA: SARS-CoV-2, NAA: NOT DETECTED

## 2019-03-05 ENCOUNTER — Ambulatory Visit: Payer: BC Managed Care – PPO | Admitting: Adult Health

## 2019-03-05 ENCOUNTER — Other Ambulatory Visit: Payer: Self-pay

## 2019-03-05 ENCOUNTER — Encounter: Payer: Self-pay | Admitting: Adult Health

## 2019-03-05 VITALS — BP 127/88 | HR 88 | Temp 98.6°F | Ht 68.0 in | Wt 191.2 lb

## 2019-03-05 DIAGNOSIS — Z8669 Personal history of other diseases of the nervous system and sense organs: Secondary | ICD-10-CM

## 2019-03-05 DIAGNOSIS — E785 Hyperlipidemia, unspecified: Secondary | ICD-10-CM | POA: Diagnosis not present

## 2019-03-05 DIAGNOSIS — R200 Anesthesia of skin: Secondary | ICD-10-CM

## 2019-03-05 DIAGNOSIS — I10 Essential (primary) hypertension: Secondary | ICD-10-CM | POA: Diagnosis not present

## 2019-03-05 DIAGNOSIS — I6350 Cerebral infarction due to unspecified occlusion or stenosis of unspecified cerebral artery: Secondary | ICD-10-CM | POA: Diagnosis not present

## 2019-03-05 MED ORDER — EZETIMIBE 10 MG PO TABS: 10.0000 mg | ORAL_TABLET | Freq: Every day | ORAL | 3 refills | Status: DC

## 2019-03-05 NOTE — Patient Instructions (Signed)
Continue aspirin 81 mg daily  and lipitor  for secondary stroke prevention  Start Zetia 10mg  daily for elevated triglycerides - repeat levels at follow up visit  Continue to follow up with PCP regarding cholesterol and blood pressure management   Referral placed to [place] [facility] for sleep evaluation   Trial use of carpel tunnel braces for wrist pain - if continues, we may need to do further testing  Continue to monitor blood pressure at home  Maintain strict control of hypertension with blood pressure goal below 130/90, diabetes with hemoglobin A1c goal below 6.5% and cholesterol with LDL cholesterol (bad cholesterol) goal below 70 mg/dL. I also advised the patient to eat a healthy diet with plenty of whole grains, cereals, fruits and vegetables, exercise regularly and maintain ideal body weight.  Followup in the future with me in 3 months or call earlier if needed       Thank you for coming to see [place] at [facility]. I hope we have been able to provide you high quality care today.  You may receive a patient satisfaction survey over the next few weeks. We would appreciate your feedback and comments so that we may continue to improve ourselves and the health of our patients.

## 2019-03-05 NOTE — Progress Notes (Signed)
[facility] [address] [place]. [place] [address] [phone]       HOSPITAL FOLLOW UP NOTE  [name] Date of Birth:  1980-08-26 Medical Record Number:  [id number]   Reason for Referral:  hospital stroke follow up    CHIEF COMPLAINT:  Chief Complaint  Patient presents with   Hospitalization Follow-up    Alone. Rm 9. Patient mentioned that she has been experiencing some stifness in her right and left hand (alternating). She stated that its never both hands at the same time. She mentioned that when she massages her hands that when it starts to go away.     HPI: Maria Lang for in office hospital follow-up regarding acute infarct of the ventral right pons secondary to small vessel disease on 12/21/2018.  History obtained from patient and chart review. Reviewed all radiology images and labs personally.  Maria Lang is a 38 y.o. female with history of anxiety, OSA, HTN, HLD, GERD, and current tobacco use  who presented to [facility] ED on 12/21/2018 with left sided weakness, dysarthria, facial droop and BP 204/126. She did not receive IV t-PA due to late presentation (>4.5 hours from time of onset).  Stroke work-up revealed acute infarct of the ventral right pons as evidenced on MRI likely secondary to small vessel disease with resultant slight left arm dysmetria.  Vessel imaging and 2D echo unremarkable.  Lower extremity venous Dopplers negative for DVT.  Recommended 3 weeks DAPT with aspirin alone for secondary stroke prevention.  Current tobacco user smoking cessation counseling provided.  History of OCP use which was discontinued 2 months prior.  BP on admission 204/126 and remained on high and throughout admission therefore initiated amlodipine 10 mg daily and recommend follow-up outpatient with PCP for ongoing monitoring/management.  LDL 146 and initiated atorvastatin 40 mg daily.  A1c 5.3.  Other stroke risk factors include overweight  (BMI 29.95) and OSA.  Patient is discharged home in stable condition without therapy needs.  Maria Lang is being seen Lang for hospital follow-up regarding recent stroke.  She has recovered well from a stroke standpoint with only mild dysarthria which worsens with anxiety.  She also has concerns of increased daytime fatigue and insomnia.  She was previously diagnosed with OSA 4 to 5 years ago with improvement of the symptoms but no longer has sleep apnea machine.  She also endorses snoring and REM sleep behaviors.  She does have underlying history of anxiety for which she is currently on Zoloft and Xanax as needed managed by PCP.  She has been experiencing awakening with left or right hand stiffness and numbness/tingling sensation that only lasts a few minutes and resolve after massaging.  She states she was told by her PCP to follow-up with [place] in regards to this.  She denies recurrent symptoms throughout the day.  She does endorse hand tightening/"lock up" with panic attacks which has been occurring for a few years.  She is currently unemployed since last year.  She has completed 3 weeks DAPT and continues on aspirin alone without bleeding or bruising.  Continues on atorvastatin 40 mg daily without myalgias.  Recent lipid panel by PCP on 02/26/2019 showed triglycerides 317 and LDL 75.  Blood pressure Lang satisfactory at 127/88.  Denies new or worsening stroke/TIA symptoms.    ROS:   14 system review of systems performed and negative with exception of numbness and speech difficulty  PMH:  Past Medical History:  Diagnosis Date  Anxiety    Deviated septum    GERD (gastroesophageal reflux disease)    History of bilateral salpingectomy    Hypertension    Renal stones    Sleep apnea    Doesn't use everyday    PSH:  Past Surgical History:  Procedure Laterality Date   BILATERAL SALPINGECTOMY  07.2016   CYSTOSCOPY WITH RETROGRADE PYELOGRAM, URETEROSCOPY AND STENT PLACEMENT Right  07/29/2015   Procedure: CYSTOSCOPY WITH RIGHT RETROGRADE PYELOGRAM, RIGHT URETEROSCOPY AND STENT PLACEMENT;  Surgeon: Irine Seal, MD;  Location: [facility];  Service: Urology;  Laterality: Right;   ECTOPIC PREGNANCY SURGERY  10/2013   HOLMIUM LASER APPLICATION Right [id number]   Procedure: HOLMIUM LASER APPLICATION;  Surgeon: Irine Seal, MD;  Location: [facility];  Service: Urology;  Laterality: Right;   NASAL SEPTUM SURGERY     doen along with tonsillectomy    TONSILLECTOMY     TYMPANOSTOMY     x 3    Social History:  Social History   Socioeconomic History   Marital status: Married    Spouse name: Not on file   Number of children: Not on file   Years of education: Not on file   Highest education level: Not on file  Occupational History   Not on file  Social Needs   Financial resource strain: Not on file   Food insecurity    Worry: Not on file    Inability: Not on file   Transportation needs    Medical: Not on file    Non-medical: Not on file  Tobacco Use   Smoking status: Current Every Day Smoker    Packs/day: 0.25    Years: 14.00    Pack years: 3.50    Types: Cigarettes    Start date: 10/04/1997    Last attempt to quit: 06/10/2014    Years since quitting: 4.7   Smokeless tobacco: Never Used  Substance and Sexual Activity   Alcohol use: No   Drug use: No   Sexual activity: Not on file  Lifestyle   Physical activity    Days per week: Not on file    Minutes per session: Not on file   Stress: Not on file  Relationships   Social connections    Talks on phone: Not on file    Gets together: Not on file    Attends religious service: Not on file    Active member of club or organization: Not on file    Attends meetings of clubs or organizations: Not on file    Relationship status: Not on file   Intimate partner violence    Fear of current or ex partner: Not on file    Emotionally abused: Not on file    Physically abused: Not on file    Forced sexual  activity: Not on file  Other Topics Concern   Not on file  Social History Narrative   Not on file    Family History:  Family History  Problem Relation Age of Onset   Hypertension Mother    Hypertension Father    Hyperlipidemia Father    Diabetes Brother    Diabetes Other     Medications:   Current Outpatient Medications on File Prior to Visit  Medication Sig Dispense Refill   ALPRAZolam (XANAX) 1 MG tablet Take 0.5-1 tablets by mouth 3 (three) times daily as needed for anxiety.  1   amLODipine (NORVASC) 10 MG tablet Take 1 tablet (10 mg total) by mouth daily. 30 tablet  0   aspirin EC 81 MG EC tablet Take 1 tablet (81 mg total) by mouth daily. 120 tablet 0   atorvastatin (LIPITOR) 40 MG tablet Take 1 tablet (40 mg total) by mouth daily at 6 PM. 60 tablet 0   levothyroxine (SYNTHROID) 75 MCG tablet Take 75 mcg by mouth daily before breakfast.     losartan-hydrochlorothiazide (HYZAAR) 50-12.5 MG tablet TAKE ONE TABLET BY MOUTH ONCE DAILY     ondansetron (ZOFRAN ODT) 4 MG disintegrating tablet Take 1 tablet (4 mg total) by mouth every 6 (six) hours as needed for nausea or vomiting. 120 tablet 2   pantoprazole (PROTONIX) 40 MG tablet TAKE 1 TABLET BY MOUTH TWICE DAILY BEFORE BREAKFAST AND DINNER 180 tablet 0   propranolol ER (INDERAL LA) 120 MG 24 hr capsule Take 1 capsule (120 mg total) by mouth daily. 30 capsule 0   sertraline (ZOLOFT) 100 MG tablet Take 150 mg by mouth daily.     fluticasone (FLONASE) 50 MCG/ACT nasal spray Place 2 sprays into both nostrils daily as needed.     hydrocortisone (PROCTO-MED HC) 2.5 % rectal cream Place 1 application rectally daily as needed.     nicotine (NICODERM CQ - DOSED IN MG/24 HOURS) 21 mg/24hr patch Place 1 patch (21 mg total) onto the skin daily. (Patient not taking: Reported on 03/05/2019) 28 patch 0   No current facility-administered medications on file prior to visit.     Allergies:   Allergies  Allergen Reactions    Hydrocodeine [Dihydrocodeine] Shortness Of Breath and Nausea And Vomiting   Hydrocodone-Acetaminophen Shortness Of Breath and Nausea Only   Coreg [Carvedilol]     Headache    Labetalol Other (See Comments)    Head itches     Physical Exam  Vitals:   03/05/19 0820  BP: 127/88  Pulse: 88  Temp: 98.6 F (37 C)  TempSrc: Oral  Weight: 191 lb 3.2 oz (86.7 kg)  Height: 5\' 8"  (1.727 m)   Body mass index is 29.07 kg/m. No exam data present  Depression screen [facility] 2/9 03/05/2019  Decreased Interest 0  Down, Depressed, Hopeless 0  PHQ - 2 Score 0     General: well developed, well nourished,  pleasant middle-age Caucasian female, seated, in no evident distress Head: head normocephalic and atraumatic.   Neck: supple with no carotid or supraclavicular bruits Cardiovascular: regular rate and rhythm, no murmurs Musculoskeletal: no deformity Skin:  no rash/petichiae Vascular:  Normal pulses all extremities   Neurologic Exam Mental Status: Awake and fully alert.   Occasional/mild dysarthria.  Oriented to place and time. Recent and remote memory intact. Attention span, concentration and fund of knowledge appropriate. Mood and affect appropriate.  Cranial Nerves: Fundoscopic exam reveals sharp disc margins. Pupils equal, briskly reactive to light. Extraocular movements full without nystagmus. Visual fields full to confrontation. Hearing intact. Facial sensation intact. Face, tongue, palate moves normally and symmetrically.  Motor: Normal bulk and tone. Normal strength in all tested extremity muscles. Sensory.: intact to touch , pinprick , position and vibratory sensation.  Coordination: Rapid alternating movements normal in all extremities. Finger-to-nose and heel-to-shin performed accurately bilaterally. Gait and Station: Arises from chair without difficulty. Stance is normal. Gait demonstrates normal stride length and balance Reflexes: 1+ and symmetric. Toes downgoing.     NIHSS   1 Modified Rankin  1   Diagnostic Data (Labs, Imaging, Testing)  Ct Code Stroke Cta Head W/wo Contrast  Ct Code Stroke Cta Neck W/wo Contrast Ct Code  Stroke Cta Cerebral Perfusion W/wo Contrast 12/21/2018 IMPRESSION:  1. Negative CTA of head and neck.  2. Negative CT perfusion when allowing for motion artifact.   Mr Brain Wo Contrast 12/22/2018 IMPRESSION:  1. Acute infarct of the ventral right pons, in keeping with the reported left-sided weakness.  2. No hemorrhage or mass effect.   Ct Head Code Stroke Wo Contrast 12/21/2018 IMPRESSION:  Negative. ASPECTS is 10.   Transthoracic Echocardiogram  1. The left ventricle has normal systolic function with an ejection fraction of 60-65%. The cavity size was normal. There is mildly increased left ventricular wall thickness. Left ventricular diastolic Doppler parameters are consistent with impaired  relaxation. No evidence of left ventricular regional wall motion abnormalities. 2. The right ventricle has normal systolic function. The cavity was normal. There is no increase in right ventricular wall thickness. 3. No evidence of mitral valve stenosis. Trivial mitral regurgitation. 4. The aortic valve is tricuspid. No stenosis of the aortic valve. 5. The aortic root is normal in size and structure. 6. Normal IVC size. No complete TR doppler jet so unable to estimate PA systolic pressure.   EKG - ST rate 105 BPM. (See cardiology reading for complete details)     ASSESSMENT: Maria Lang is a 38 y.o. 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 Vascular risk factors include HTN, HLD, hx of OSA and tobacco use.  Recovered well from a stroke standpoint with residual mild/occasional dysarthria which is worsened with anxiety.  She also has concerns of  daytime fatigue and insomnia with prior history of OSA not on CPAP.  She also speaks about bilateral hand numbness/tingling and pain awakening in the morning    PLAN:  1. Right pontine infarct: Continue aspirin 81 mg daily  and atorvastatin for secondary stroke prevention. Maintain strict control of hypertension with blood pressure goal below 130/90, diabetes with hemoglobin A1c goal below 6.5% and cholesterol with LDL cholesterol (bad cholesterol) goal below 70 mg/dL.  I also advised the patient to eat a healthy diet with plenty of whole grains, cereals, fruits and vegetables, exercise regularly with at least 30 minutes of continuous activity daily and maintain ideal body weight. 2. Hx of OSA: Referral placed to [facility] due to recent stroke with untreated sleep apnea 3. Bilateral hand complaints: Possibly secondary to carpal tunnel syndrome or orthopedic condition.  Discussion regarding use of carpal tunnel splints at night to see if this relieves symptoms.  If no benefit or worsens, may consider EMG/NCV in the future if needed. 4. HTN: Advised to continue current treatment regimen.  Lang's BP stable.  Advised to continue to monitor at home along with continued follow-up with PCP for management 5. HLD: Recommend initiating Zetia 10 mg daily in addition to atorvastatin 40 mg daily due to elevated triglyceride levels which appears to be ongoing with review of prior lab results.  Will repeat lipid panel at follow-up visit   Follow up in 3 months or call earlier if needed   Greater than 50% of time during this 45 minute visit was spent on counseling, explanation of diagnosis of right pontine infarct, reviewing risk factor management of history of OSA, HTN and HLD, discussion regarding and complaints likely secondary to carpal tunnel syndrome versus orthopedic condition planning of further management along with potential future management, and discussion with patient and family  answering all  questions.    Frann Rider, AGNP-BC  [facility] [address] [place], [place] [address]  Phone [phone] Fax [phone] Note: This document was prepared with digital dictation and possible smart phrase technology. Any transcriptional errors that result from this process are unintentional.

## 2019-03-06 NOTE — Progress Notes (Signed)
I agree with the above plan 

## 2019-03-17 ENCOUNTER — Encounter: Payer: Self-pay | Admitting: Neurology

## 2019-03-17 ENCOUNTER — Ambulatory Visit (INDEPENDENT_AMBULATORY_CARE_PROVIDER_SITE_OTHER): Payer: BC Managed Care – PPO | Admitting: Neurology

## 2019-03-17 ENCOUNTER — Other Ambulatory Visit: Payer: Self-pay

## 2019-03-17 VITALS — BP 144/80 | HR 80 | Ht 67.0 in | Wt 194.0 lb

## 2019-03-17 DIAGNOSIS — G4733 Obstructive sleep apnea (adult) (pediatric): Secondary | ICD-10-CM

## 2019-03-17 DIAGNOSIS — F17200 Nicotine dependence, unspecified, uncomplicated: Secondary | ICD-10-CM | POA: Diagnosis not present

## 2019-03-17 DIAGNOSIS — I6350 Cerebral infarction due to unspecified occlusion or stenosis of unspecified cerebral artery: Secondary | ICD-10-CM

## 2019-03-17 DIAGNOSIS — E669 Obesity, unspecified: Secondary | ICD-10-CM | POA: Diagnosis not present

## 2019-03-17 NOTE — Patient Instructions (Signed)

## 2019-03-17 NOTE — Progress Notes (Signed)
Subjective:    Patient ID: Maria Lang is a 38 y.o. female.  HPI     Star Age, MD, PhD [facility] [address]O. [place], [place] [address]  Dear Janett Billow,   I saw your patient, Maria Lang, upon your kind request in my sleep clinic today for initial consultation of her sleep disorder, in particular reevaluation of her prior diagnosis of obstructive sleep apnea.  The patient is unaccompanied today. As you know, Maria Lang is a 38 year old right-handed woman with an underlying medical history of right pontine stroke in June 2020, smoking, hypertension, hyperlipidemia, reflux disease and overweight state, who was previously diagnosed with obstructive sleep apnea and placed on CPAP therapy.  Prior sleep study results are not available for my review today.  I reviewed your office note from 03/05/2019.  She is no longer on CPAP therapy and does not have the machine any longer.  Her Epworth sleepiness score is 1 out of 24, fatigue severity score is 11 out of 63.  She is married and lives with her husband and children (32, 61, 62) and 83 mo grandson.  She smokes about 1/2 pack/day, drinks alcohol occasionally and caffeine in the form of coffee, generally 1 cup/day or even less.  She likes to drink lemonade.  She has reduced her smoking to some degree after her stroke.  She tried CPAP in the past and recalls that the mask was uncomfortable and she is a restless sleeper and felt that the machine and the hose were restricting her.  She has trouble falling asleep and staying asleep at times.  She has taken over-the-counter p.m. type medications.  Her difficulty falling asleep has become a little bit worse since her stroke.  Her bedtime and rise time vary, generally she is in bed between 8 and 11 PM, rise time between 6 and 730.  She had a tonsillectomy and septum surgery as well as multiple ear tubes placed.  She had a left tympanic membrane perforation repair,  she has chronic mastoiditis.  She would be willing to get rechecked for sleep apnea and consider CPAP therapy but is reluctant to consider CPAP again.  She denies any significant residual weakness.  She denies night to night nocturia or recurrent morning headaches.  Her Past Medical History Is Significant For: Past Medical History:  Diagnosis Date  . Anxiety   . Deviated septum   . GERD (gastroesophageal reflux disease)   . History of bilateral salpingectomy   . Hypertension   . Renal stones   . Sleep apnea    Doesn't use everyday    Her Past Surgical History Is Significant For: Past Surgical History:  Procedure Laterality Date  . BILATERAL SALPINGECTOMY  07.2016  . CYSTOSCOPY WITH RETROGRADE PYELOGRAM, URETEROSCOPY AND STENT PLACEMENT Right 07/29/2015   Procedure: CYSTOSCOPY WITH RIGHT RETROGRADE PYELOGRAM, RIGHT URETEROSCOPY AND STENT PLACEMENT;  Surgeon: Irine Seal, MD;  Location: [facility];  Service: Urology;  Laterality: Right;  . ECTOPIC PREGNANCY SURGERY  10/2013  . HOLMIUM LASER APPLICATION Right [id number]   Procedure: HOLMIUM LASER APPLICATION;  Surgeon: Irine Seal, MD;  Location: [facility];  Service: Urology;  Laterality: Right;  . NASAL SEPTUM SURGERY     doen along with tonsillectomy   . TONSILLECTOMY    . TYMPANOSTOMY     x 3    Her Family History Is Significant For: Family History  Problem Relation Age of Onset  . Hypertension Mother   . Hypertension Father   .  Hyperlipidemia Father   . Diabetes Brother   . Diabetes Other     Her Social History Is Significant For: Social History   Socioeconomic History  . Marital status: Married    Spouse name: Not on file  . Number of children: Not on file  . Years of education: Not on file  . Highest education level: Not on file  Occupational History  . Not on file  Social Needs  . Financial resource strain: Not on file  . Food insecurity    Worry: Not on file    Inability: Not on file  . Transportation needs     Medical: Not on file    Non-medical: Not on file  Tobacco Use  . Smoking status: Current Every Day Smoker    Packs/day: 0.25    Years: 14.00    Pack years: 3.50    Types: Cigarettes    Start date: 10/04/1997    Last attempt to quit: 06/10/2014    Years since quitting: 4.7  . Smokeless tobacco: Never Used  Substance and Sexual Activity  . Alcohol use: No  . Drug use: No  . Sexual activity: Not on file  Lifestyle  . Physical activity    Days per week: Not on file    Minutes per session: Not on file  . Stress: Not on file  Relationships  . Social Herbalist on phone: Not on file    Gets together: Not on file    Attends religious service: Not on file    Active member of club or organization: Not on file    Attends meetings of clubs or organizations: Not on file    Relationship status: Not on file  Other Topics Concern  . Not on file  Social History Narrative  . Not on file    Her Allergies Are:  Allergies  Allergen Reactions  . Hydrocodeine [Dihydrocodeine] Shortness Of Breath and Nausea And Vomiting  . Hydrocodone-Acetaminophen Shortness Of Breath and Nausea Only  . Coreg [Carvedilol]     Headache   . Labetalol Other (See Comments)    Head itches  :   Her Current Medications Are:  Outpatient Encounter Medications as of 03/17/2019  Medication Sig  . ALPRAZolam (XANAX) 1 MG tablet Take 0.5-1 tablets by mouth 3 (three) times daily as needed for anxiety.  [name] Kitchen amLODipine (NORVASC) 10 MG tablet Take 1 tablet (10 mg total) by mouth daily.  [name] Kitchen aspirin EC 81 MG EC tablet Take 1 tablet (81 mg total) by mouth daily.  [name] Kitchen atorvastatin (LIPITOR) 40 MG tablet Take 1 tablet (40 mg total) by mouth daily at 6 PM.  . ezetimibe (ZETIA) 10 MG tablet Take 1 tablet (10 mg total) by mouth daily.  [name] Kitchen levothyroxine (SYNTHROID) 75 MCG tablet Take 75 mcg by mouth daily before breakfast.  . losartan-hydrochlorothiazide (HYZAAR) 50-12.5 MG tablet TAKE ONE TABLET BY MOUTH ONCE DAILY  .  ondansetron (ZOFRAN ODT) 4 MG disintegrating tablet Take 1 tablet (4 mg total) by mouth every 6 (six) hours as needed for nausea or vomiting.  . pantoprazole (PROTONIX) 40 MG tablet TAKE 1 TABLET BY MOUTH TWICE DAILY BEFORE BREAKFAST AND DINNER  . propranolol ER (INDERAL LA) 120 MG 24 hr capsule Take 1 capsule (120 mg total) by mouth daily.  . sertraline (ZOLOFT) 100 MG tablet Take 150 mg by mouth daily.  . [DISCONTINUED] fluticasone (FLONASE) 50 MCG/ACT nasal spray Place 2 sprays into both nostrils daily as needed.  . [  DISCONTINUED] hydrocortisone (PROCTO-MED HC) 2.5 % rectal cream Place 1 application rectally daily as needed.  . [DISCONTINUED] nicotine (NICODERM CQ - DOSED IN MG/24 HOURS) 21 mg/24hr patch Place 1 patch (21 mg total) onto the skin daily. (Patient not taking: Reported on 03/05/2019)   No facility-administered encounter medications on file as of 03/17/2019.   :  Review of Systems:  Out of a complete 14 point review of systems, all are reviewed and negative with the exception of these symptoms as listed below: Review of Systems  Neurological:       Pt presents today to discuss her sleep. Pt had a sleep study about 4 years ago and was started on cpap but could not tolerate it. She no longer has the cpap. Pt does endorse snoring.  Epworth Sleepiness Scale 0= would never doze 1= slight chance of dozing 2= moderate chance of dozing 3= high chance of dozing  Sitting and reading: 0 Watching TV: 0 Sitting inactive in a public place (ex. Theater or meeting): 0 As a passenger in a car for an hour without a break: 0 Lying down to rest in the afternoon: 1 Sitting and talking to someone: 0 Sitting quietly after lunch (no alcohol): 0 In a car, while stopped in traffic: 0 Total: 1     Objective:  Neurological Exam  Physical Exam Physical Examination:   Vitals:   03/17/19 0843  BP: (!) 144/80  Pulse: 80    General Examination: The patient is a very pleasant 38 y.o. female  in no acute distress. She appears well-developed and well-nourished and adequately groomed.   HEENT: Normocephalic, atraumatic, pupils are equal, round and reactive to light and accommodation. Extraocular tracking is good without limitation to gaze excursion or nystagmus noted. Normal smooth pursuit is noted. Hearing is grossly intact. Face is symmetric with normal facial animation and normal facial sensation. Speech is clear with no dysarthria noted. There is no hypophonia. There is no lip, neck/head, jaw or voice tremor. Neck is supple with full range of passive and active motion. There are no carotid bruits on auscultation. Oropharynx exam reveals: mild mouth dryness, marginal dental hygiene and mild airway crowding, due to smaller airway entry. Mallampati is class II. Tongue protrudes centrally and palate elevates symmetrically. Tonsils are absent. Neck size is 16 5/8 inches. She has a Mild to moderate overbite. Nasal inspection reveals no significant nasal mucosal bogginess or redness and slight septal deviation to the L.   Chest: Clear to auscultation without wheezing, rhonchi or crackles noted.  Heart: S1+S2+0, regular and normal without murmurs, rubs or gallops noted.   Abdomen: Soft, non-tender and non-distended with normal bowel sounds appreciated on auscultation.  Extremities: There is no pitting edema in the distal lower extremities bilaterally.   Skin: Warm and dry without trophic changes noted. There are no varicose veins.  Musculoskeletal: exam reveals no obvious joint deformities, tenderness or joint swelling or erythema.   Neurologically:  Mental status: The patient is awake, alert and oriented in all 4 spheres. immediate and remote memory, attention, language skills and fund of knowledge are appropriate. There is no evidence of aphasia, agnosia, apraxia or anomia. Speech is clear with normal prosody and enunciation. Thought process is linear. Mood is normal and affect is normal.   Cranial nerves II - XII are as described above under HEENT exam. In addition: shoulder shrug is normal with equal shoulder height noted. Motor exam: Normal bulk, strength and tone is noted. There is no drift, tremor or  rebound. Romberg is negative. Reflexes are 2+ throughout. Fine motor skills and coordination: grossly intact.  Cerebellar testing: No dysmetria or intention tremor. There is no truncal or gait ataxia.  Sensory exam: intact to light touch in the upper and lower extremities.  Gait, station and balance: She stands easily. No veering to one side is noted. No leaning to one side is noted. Posture is age-appropriate and stance is narrow based. Gait shows normal stride length and normal pace. No problems turning are noted. Tandem walk is doable, with slight challenge.  Assessment and Plan:  In summary, Maria Lang is a very pleasant 38 y.o.-year old  female with an underlying medical history of right pontine stroke in June 2020, smoking, hypertension, hyperlipidemia, reflux disease and overweight state, who Presents for Reevaluation of her prior diagnosis of obstructive sleep apnea.  She had a sleep study some 4 years ago but no longer has a CPAP machine, she had difficulty tolerating CPAP at the time but would be willing to get rechecked and if needed consider CPAP therapy again. I had a long chat with the patient about my findings and the diagnosis of OSA, its prognosis and treatment options. We talked about medical treatments, surgical interventions and non-pharmacological approaches. I explained in particular the risks and ramifications of untreated moderate to severe OSA, especially with respect to developing cardiovascular disease down the Road, including congestive heart failure, difficult to treat hypertension, cardiac arrhythmias, or stroke. Even type 2 diabetes has, in part, been linked to untreated OSA. Symptoms of untreated OSA include daytime sleepiness, memory problems, mood  irritability and mood disorder such as depression and anxiety, lack of energy, as well as recurrent headaches, especially morning headaches. We talked about smoking cessation and trying to maintain a healthy lifestyle in general. I advised the patient not to drive when feeling sleepy. I recommended the following at this time: sleep study.   I explained the sleep test procedure to the patient and also outlined possible surgical and non-surgical treatment options of OSA, including the use of a custom-made dental device (which would require a referral to a specialist dentist or oral surgeon), upper airway surgical options.  I also explained the CPAP treatment option to the patient, who indicated that she would be willing to try CPAP if the need arises. I explained the importance of being compliant with PAP treatment, not only for insurance purposes but primarily to improve Her symptoms, and for the patient's long term health benefit, including to reduce Her cardiovascular risks. I answered all her questions today and the patient was in agreement. I would like to see her back after the sleep study is completed and encouraged her to call with any interim questions, concerns, problems or updates.   Thank you very much for allowing me to participate in the care of this nice patient. If I can be of any further assistance to you please do not hesitate to talk to me.  Sincerely,   Star Age, MD, PhD

## 2019-03-26 ENCOUNTER — Ambulatory Visit (INDEPENDENT_AMBULATORY_CARE_PROVIDER_SITE_OTHER): Payer: BC Managed Care – PPO | Admitting: Neurology

## 2019-03-26 DIAGNOSIS — I6350 Cerebral infarction due to unspecified occlusion or stenosis of unspecified cerebral artery: Secondary | ICD-10-CM

## 2019-03-26 DIAGNOSIS — G4733 Obstructive sleep apnea (adult) (pediatric): Secondary | ICD-10-CM | POA: Diagnosis not present

## 2019-03-26 DIAGNOSIS — E669 Obesity, unspecified: Secondary | ICD-10-CM

## 2019-03-26 DIAGNOSIS — F17200 Nicotine dependence, unspecified, uncomplicated: Secondary | ICD-10-CM

## 2019-04-02 ENCOUNTER — Encounter: Payer: Self-pay | Admitting: Adult Health

## 2019-04-02 DIAGNOSIS — R200 Anesthesia of skin: Secondary | ICD-10-CM

## 2019-04-08 ENCOUNTER — Telehealth: Payer: Self-pay

## 2019-04-08 NOTE — Telephone Encounter (Signed)
I called pt. I advised pt that Dr. Rexene Alberts reviewed their sleep study results and found that pt has mild overall osa. Dr. Rexene Alberts recommends that pt start an auto pap at home. I discussed alternative treatments with her but pt opted to try auto pap. I reviewed PAP compliance expectations with the pt. Pt is agreeable to starting an auto-PAP. I advised pt that an order will be sent to a DME, [facility], and [facility] will call the pt within about one week after they file with the pt's insurance. [facility] will show the pt how to use the machine, fit for masks, and troubleshoot the auto-PAP if needed. A follow up appt was made for insurance purposes with Janett Billow, NP  on 06/12/19 at 8:45am. Pt verbalized understanding to arrive 15 minutes early and bring their auto-PAP. A letter with all of this information in it will be mailed to the pt as a reminder. I verified with the pt that the address we have on file is correct. Pt verbalized understanding of results. Pt had no questions at this time but was encouraged to call back if questions arise. I have sent the order to [facility] and have received confirmation that they have received the order.

## 2019-04-08 NOTE — Procedures (Signed)
Patient Information     First Name: Maria Last Name: Lang ID: [id number]  Birth Date: 12-Nov-1980 Age: 38 Gender: Female  Referring Provider: Frann Rider, NP BMI: 30.4 (W=194 lb, H=5' 7'')  Neck Circ.:  16 '' Epworth:  1/24   Sleep Study Information    Study Date: Mar 26, 2019 S/H/A Version: 001.001.001.001 / 4.1.1528 / 58  History:    38 year old woman with a history of right pontine stroke in June 2020, smoking, hypertension, hyperlipidemia, reflux disease and overweight state, who was previously diagnosed with obstructive sleep apnea and placed on CPAP therapy. She no longer has a CPAP machine.   Summary & Diagnosis:    OSA       Recommendations:     This home sleep test demonstrates overall mild obstructive sleep apnea with a total AHI of 12.7/hour and O2 nadir of 89%. Given the patient's medical history and sleep related complaints, treatment with positive airway pressure is a reasonable choice; this can be achieved in the form of autoPAP trial/titration at home. A full night CPAP titration study will help with proper treatment settings and mask fitting if needed. Alternative treatments may include weight loss along with avoidance of the supine sleep position, or an oral appliance in appropriate candidates.   Please note that untreated obstructive sleep apnea may carry additional perioperative morbidity. Patients with significant obstructive sleep apnea should receive perioperative PAP therapy and the surgeons and particularly the anesthesiologist should be informed of the diagnosis and the severity of the sleep disordered breathing. The patient should be cautioned not to drive, work at heights, or operate dangerous or heavy equipment when tired or sleepy. Review and reiteration of good sleep hygiene measures should be pursued with any patient. Other causes of the patient's symptoms, including circadian rhythm disturbances, an underlying mood disorder, medication effect and/or an  underlying medical problem cannot be ruled out based on this test. Clinical correlation is recommended.  The patient and his referring provider will be notified of the test results. The patient will be seen in follow up in sleep clinic at [facility].  I certify that I have reviewed the raw data recording prior to the issuance of this report in accordance with the standards of the American Academy of Sleep Medicine (AASM).  Star Age, MD, PhD [facility]) Diplomat, ABPN (Neurology and Sleep)           Sleep Summary  Oxygen Saturation Statistics   Start Study Time: End Study Time: Total Recording Time:     10:24:36 PM 7:42:40 AM      9 h, 18 min  Total Sleep Time % REM of Sleep Time:  8 h, 19 min  17.5    Mean: 94 Minimum: 89 Maximum: 99  Mean of Desaturations Nadirs (%):   92  Oxygen Desaturation. %: 4-9 10-20 >20 Total  Events Number Total  15 100.0  0 0.0  0 0.0  15 100.0  Oxygen Saturation: <90 <=88 <85 <80 <70  Duration (minutes): Sleep % 0.1 0.0 0.0 0.0 0.0 0.0 0.0 0.0 0.0 0.0     Respiratory Indices      Total Events REM NREM All Night  pRDI:  82  pAHI:  78 ODI:  15  pAHIc:  3  % CSR: 0.0 24.1 24.1 8.1 0.0 11.5 10.8 1.5 0.6 13.3 12.7 2.4 0.5       Pulse Rate Statistics during Sleep (BPM)      Mean: 69  Minimum: 53 Maximum: 100    Indices are calculated using technically valid sleep time of  6 hrs, 9 min. Central-Indices are calculated using technically valid sleep time of  5  hrs, 46 min. pRDI/pAHI are calculated using oxi desaturations ? 3%  Body Position Statistics  Position Supine Prone Right Left Non-Supine  Sleep (min) 198.5 277.0 5.6 18.5 301.1  Sleep % 39.7 55.4 1.1 3.7 60.3  pRDI 13.4 8.7 N/A 54.7 13.2  pAHI 12.7 8.1 N/A 54.7 12.7  ODI 3.6 1.0 N/A 7.3 1.5     Snoring Statistics Snoring Level (dB) >40 >50 >60 >70 >80 >Threshold (45)  Sleep (min) 104.2 3.1 1.4 0.0 0.0 6.6  Sleep % 20.9 0.6 0.3 0.0 0.0  1.3    Mean: 40 dB Sleep Stages Chart                    pAHI=12.7                                Mild              Moderate                    Severe                                                 5              15                    30

## 2019-04-08 NOTE — Addendum Note (Signed)
Addended by: Star Age on: 04/08/2019 08:04 AM   Modules accepted: Orders

## 2019-04-08 NOTE — Telephone Encounter (Signed)
-----   Message from Star Age, MD sent at 04/08/2019  8:04 AM EDT ----- Patient referred by Maria Gower, NP, seen by me on 03/17/19, HST on 03/26/19.    Please call and notify the patient that the recent home sleep test showed obstructive sleep apnea. OSA is overall mild, but worth treating to see if she feels better after treatment and in light of her stroke Hx, it would be recommended to treat/improve all potential risk factors for vasc. D/s. To that end I recommend treatment for this in the form of autoPAP, which means, that we don't have to bring her in for a sleep study with CPAP, but will let her use an autoPAP machine at home, through a [facility] (of her choice, or as per insurance requirement). The DME representative will educate her on how to use the machine, how to put the mask on, etc. I have placed an order in the chart. Please send referral, talk to patient, send report to referring MD. We will need a FU in sleep clinic for 10 weeks post-PAP set up, please arrange that with me or one of our NPs.  Alternative treatments - that don't work immediately - include wt loss and a dental device. If she prefers to go the dental route: I can make a referral to a dentist. Keep in mind, her insurance may not cover dental treatment for OSA, if she has not tried and "failed" PAP first.   Star Age, MD, PhD [facility] ([place])

## 2019-04-08 NOTE — Progress Notes (Signed)
Patient referred by Claris Gower, NP, seen by me on 03/17/19, HST on 03/26/19.    Please call and notify the patient that the recent home sleep test showed obstructive sleep apnea. OSA is overall mild, but worth treating to see if she feels better after treatment and in light of her stroke Hx, it would be recommended to treat/improve all potential risk factors for vasc. D/s. To that end I recommend treatment for this in the form of autoPAP, which means, that we don't have to bring her in for a sleep study with CPAP, but will let her use an autoPAP machine at home, through a [facility] (of her choice, or as per insurance requirement). The DME representative will educate her on how to use the machine, how to put the mask on, etc. I have placed an order in the chart. Please send referral, talk to patient, send report to referring MD. We will need a FU in sleep clinic for 10 weeks post-PAP set up, please arrange that with me or one of our NPs.  Alternative treatments - that don't work immediately - include wt loss and a dental device. If she prefers to go the dental route: I can make a referral to a dentist. Keep in mind, her insurance may not cover dental treatment for OSA, if she has not tried and "failed" PAP first.   Star Age, MD, PhD [facility] ([place])

## 2019-05-01 ENCOUNTER — Other Ambulatory Visit: Payer: Self-pay

## 2019-05-01 ENCOUNTER — Encounter (INDEPENDENT_AMBULATORY_CARE_PROVIDER_SITE_OTHER): Payer: BC Managed Care – PPO | Admitting: Diagnostic Neuroimaging

## 2019-05-01 ENCOUNTER — Ambulatory Visit: Payer: BC Managed Care – PPO | Admitting: Diagnostic Neuroimaging

## 2019-05-01 DIAGNOSIS — R200 Anesthesia of skin: Secondary | ICD-10-CM | POA: Diagnosis not present

## 2019-05-01 DIAGNOSIS — Z0289 Encounter for other administrative examinations: Secondary | ICD-10-CM

## 2019-05-05 NOTE — Procedures (Signed)
[facility]  NCS (NERVE CONDUCTION STUDY) WITH EMG (ELECTROMYOGRAPHY) REPORT   STUDY DATE: 05/01/19 PATIENT NAME: Maria Lang DOB: 04-11-1981 MRN: [id number]  ORDERING CLINICIAN: Frann Rider, NP   TECHNOLOGIST: Sherre Scarlet ELECTROMYOGRAPHER: Earlean Polka. Kwaku Mostafa, MD  CLINICAL INFORMATION: 38 year old female with bilateral hand numbness.  FINDINGS: NERVE CONDUCTION STUDY: Bilateral median and right ulnar motor responses of normal distal latencies, amplitudes, conduction velocities.  Left median and right ulnar sensory responses are normal.  Right median sensory response has prolonged peak latency and normal amplitude.  Left median to ulnar transcarpal comparison study is normal.  Right median to ulnar transcarpal comparison study shows prolonged peak latency difference.  Right ulnar F-wave latency is normal.    NEEDLE ELECTROMYOGRAPHY:  Needle examination of right upper extremity deltoid, biceps, triceps, flexor carpi radialis, first dorsal interosseous is normal.    IMPRESSION:   Abnormal study demonstrating: - Mild right median neuropathy at the wrist consistent with mild right carpal tunnel syndrome.    INTERPRETING PHYSICIAN:  Penni Bombard, MD Certified in Neurology, Neurophysiology and Neuroimaging  [facility] [address] [place], [place] [address] [phone]   [facility]    Nerve / Sites Muscle Latency Ref. Amplitude Ref. Rel Amp Segments Distance Velocity Ref. Area    ms ms mV mV %  cm m/s m/s mVms  R Median - APB     Wrist APB 3.2 ?4.4 11.9 ?4.0 100 Wrist - APB 7   38.1     Upper arm APB 6.8  11.7  98.4 Upper arm - Wrist 22 62 ?49 38.0  L Median - APB     Wrist APB 3.0 ?4.4 11.3 ?4.0 100 Wrist - APB 7   35.8     Upper arm APB 6.6  11.1  98 Upper arm - Wrist 22 60 ?49 33.9  R Ulnar - ADM     Wrist ADM 2.2 ?3.3 13.0 ?6.0 100 Wrist - ADM 7   30.5     B.Elbow ADM 5.6  13.1  101 B.Elbow - Wrist  21 62 ?49 30.1     A.Elbow ADM 7.2  12.7  97.1 A.Elbow - B.Elbow 10 60 ?49 30.7         A.Elbow - Wrist               SNC    Nerve / Sites Rec. Site Peak Lat Ref.  Amp Ref. Segments Distance Peak Diff Ref.    ms ms V V  cm ms ms  R Median, Ulnar - Transcarpal comparison     Median Palm Wrist 2.4 ?2.2 116 ?35 Median Palm - Wrist 8       Ulnar Palm Wrist 1.7 ?2.2 25 ?12 Ulnar Palm - Wrist 8          Median Palm - Ulnar Palm  0.7 ?0.4  L Median, Ulnar - Transcarpal comparison     Median Palm Wrist 1.9 ?2.2 118 ?35 Median Palm - Wrist 8       Ulnar Palm Wrist 1.7 ?2.2 33 ?12 Ulnar Palm - Wrist 8          Median Palm - Ulnar Palm  0.2 ?0.4  R Median - Orthodromic (Dig II, Mid palm)     Dig II Wrist 3.5 ?3.4 31 ?10 Dig II - Wrist 13    L Median - Orthodromic (Dig II, Mid palm)     Dig II Wrist 3.0 ?3.4 20 ?10  Dig II - Wrist 13    R Ulnar - Orthodromic, (Dig V, Mid palm)     Dig V Wrist 2.8 ?3.1 11 ?5 Dig V - Wrist 86                 F  Wave    Nerve F Lat Ref.   ms ms  R Ulnar - ADM 25.4 ?32.0       EMG full       EMG Summary Table    Spontaneous MUAP Recruitment  Muscle IA Fib PSW Fasc Other Amp Dur. Poly Pattern  R. Deltoid Normal None None None _______ Normal Normal Normal Normal  R. Biceps brachii Normal None None None _______ Normal Normal Normal Normal  R. Triceps brachii Normal None None None _______ Normal Normal Normal Normal  R. Flexor carpi radialis Normal None None None _______ Normal Normal Normal Normal  R. First dorsal interosseous Normal None None None _______ Normal Normal Normal Normal

## 2019-05-06 ENCOUNTER — Telehealth: Payer: Self-pay | Admitting: *Deleted

## 2019-05-06 NOTE — Telephone Encounter (Signed)
Spoke with patient and informed her recent nerve conduction study showed mild carpal tunnel syndrome in her right wrist. The NP would recommend use of carpal tunnel brace at night and during the day if needed. Patient verbalized understanding, appreciation.

## 2019-06-12 ENCOUNTER — Ambulatory Visit: Payer: BC Managed Care – PPO | Admitting: Adult Health

## 2019-07-07 ENCOUNTER — Other Ambulatory Visit: Payer: Self-pay | Admitting: Gastroenterology

## 2019-07-07 DIAGNOSIS — K2950 Unspecified chronic gastritis without bleeding: Secondary | ICD-10-CM

## 2019-10-13 ENCOUNTER — Other Ambulatory Visit: Payer: Self-pay | Admitting: Adult Health

## 2020-02-19 ENCOUNTER — Other Ambulatory Visit: Payer: Self-pay | Admitting: Adult Health

## 2020-03-02 ENCOUNTER — Other Ambulatory Visit: Payer: Self-pay | Admitting: Adult Health

## 2020-03-12 ENCOUNTER — Encounter (HOSPITAL_COMMUNITY): Payer: Self-pay | Admitting: Physical Therapy

## 2020-03-12 NOTE — Therapy (Signed)
[place], [place], [address] Phone: [phone]   Fax:  [phone]  Patient Details  Name: Maria Lang MRN: [id number] Date of Birth: 1981/03/15 Referring Provider:  No ref. provider found  Encounter Date: 03/12/2020   PHYSICAL THERAPY DISCHARGE SUMMARY  Visits from Start of Care: 4  Current functional level related to goals / functional outcomes: unknown   Remaining deficits: unknown   Education / Equipment: HEP Plan: Patient agrees to discharge.  Patient goals were not met. Patient is being discharged due to not returning since the last visit.  ?????     Rayetta Humphrey, PT CLT [phone] 03/12/2020, 5:01 PM  [place] [address] [place], [place], [address] Phone: [phone]   Fax:  [phone]

## 2020-07-27 ENCOUNTER — Encounter (HOSPITAL_COMMUNITY): Payer: Self-pay | Admitting: *Deleted

## 2020-07-27 ENCOUNTER — Emergency Department (HOSPITAL_COMMUNITY): Payer: BC Managed Care – PPO

## 2020-07-27 ENCOUNTER — Emergency Department (HOSPITAL_COMMUNITY)
Admission: EM | Admit: 2020-07-27 | Discharge: 2020-07-27 | Disposition: A | Discharge status: Home / Self Care | Payer: BC Managed Care – PPO | Attending: Emergency Medicine | Admitting: Emergency Medicine

## 2020-07-27 ENCOUNTER — Other Ambulatory Visit: Payer: Self-pay

## 2020-07-27 DIAGNOSIS — Z5321 Procedure and treatment not carried out due to patient leaving prior to being seen by health care provider: Secondary | ICD-10-CM | POA: Diagnosis not present

## 2020-07-27 DIAGNOSIS — R079 Chest pain, unspecified: Secondary | ICD-10-CM | POA: Diagnosis not present

## 2020-07-27 LAB — TROPONIN I (HIGH SENSITIVITY)
Troponin I (High Sensitivity): 3 ng/L (ref <18)
Troponin I (High Sensitivity): 3 ng/L (ref <18)

## 2020-07-27 LAB — BASIC METABOLIC PANEL
Anion gap: 9 (ref 5–15)
BUN: 13 mg/dL (ref 6–20)
CO2: 24 mmol/L (ref 22–32)
Calcium: 9.5 mg/dL (ref 8.9–10.3)
Chloride: 103 mmol/L (ref 98–111)
Creatinine, Ser: 0.91 mg/dL (ref 0.44–1.00)
GFR, Estimated: >60 mL/min (ref >60)
Glucose, Bld: 115 mg/dL — ABNORMAL HIGH (ref 70–99)
Potassium: 3.5 mmol/L (ref 3.5–5.1)
Sodium: 136 mmol/L (ref 135–145)

## 2020-07-27 LAB — CBC
HCT: 36.6 % (ref 36.0–46.0)
Hemoglobin: 11.2 g/dL — ABNORMAL LOW (ref 12.0–15.0)
MCH: 27.6 pg (ref 26.0–34.0)
MCHC: 30.6 g/dL (ref 30.0–36.0)
MCV: 90.1 fL (ref 80.0–100.0)
Platelets: 285 10*3/uL (ref 150–400)
RBC: 4.06 MIL/uL (ref 3.87–5.11)
RDW: 15.2 % (ref 11.5–15.5)
WBC: 9 10*3/uL (ref 4.0–10.5)
nRBC: 0 % (ref 0.0–0.2)

## 2020-07-27 IMAGING — DX DG CHEST 2V
2 series · 2 of 2 positions shown · non-contrast
Comparison: Prior chest radiographs [DATE].

CLINICAL DATA: Chest pain. Additional history provided: Patient
reports mid chest pain for 6 days.

EXAM:
CHEST - 2 VIEW

[chest pa]
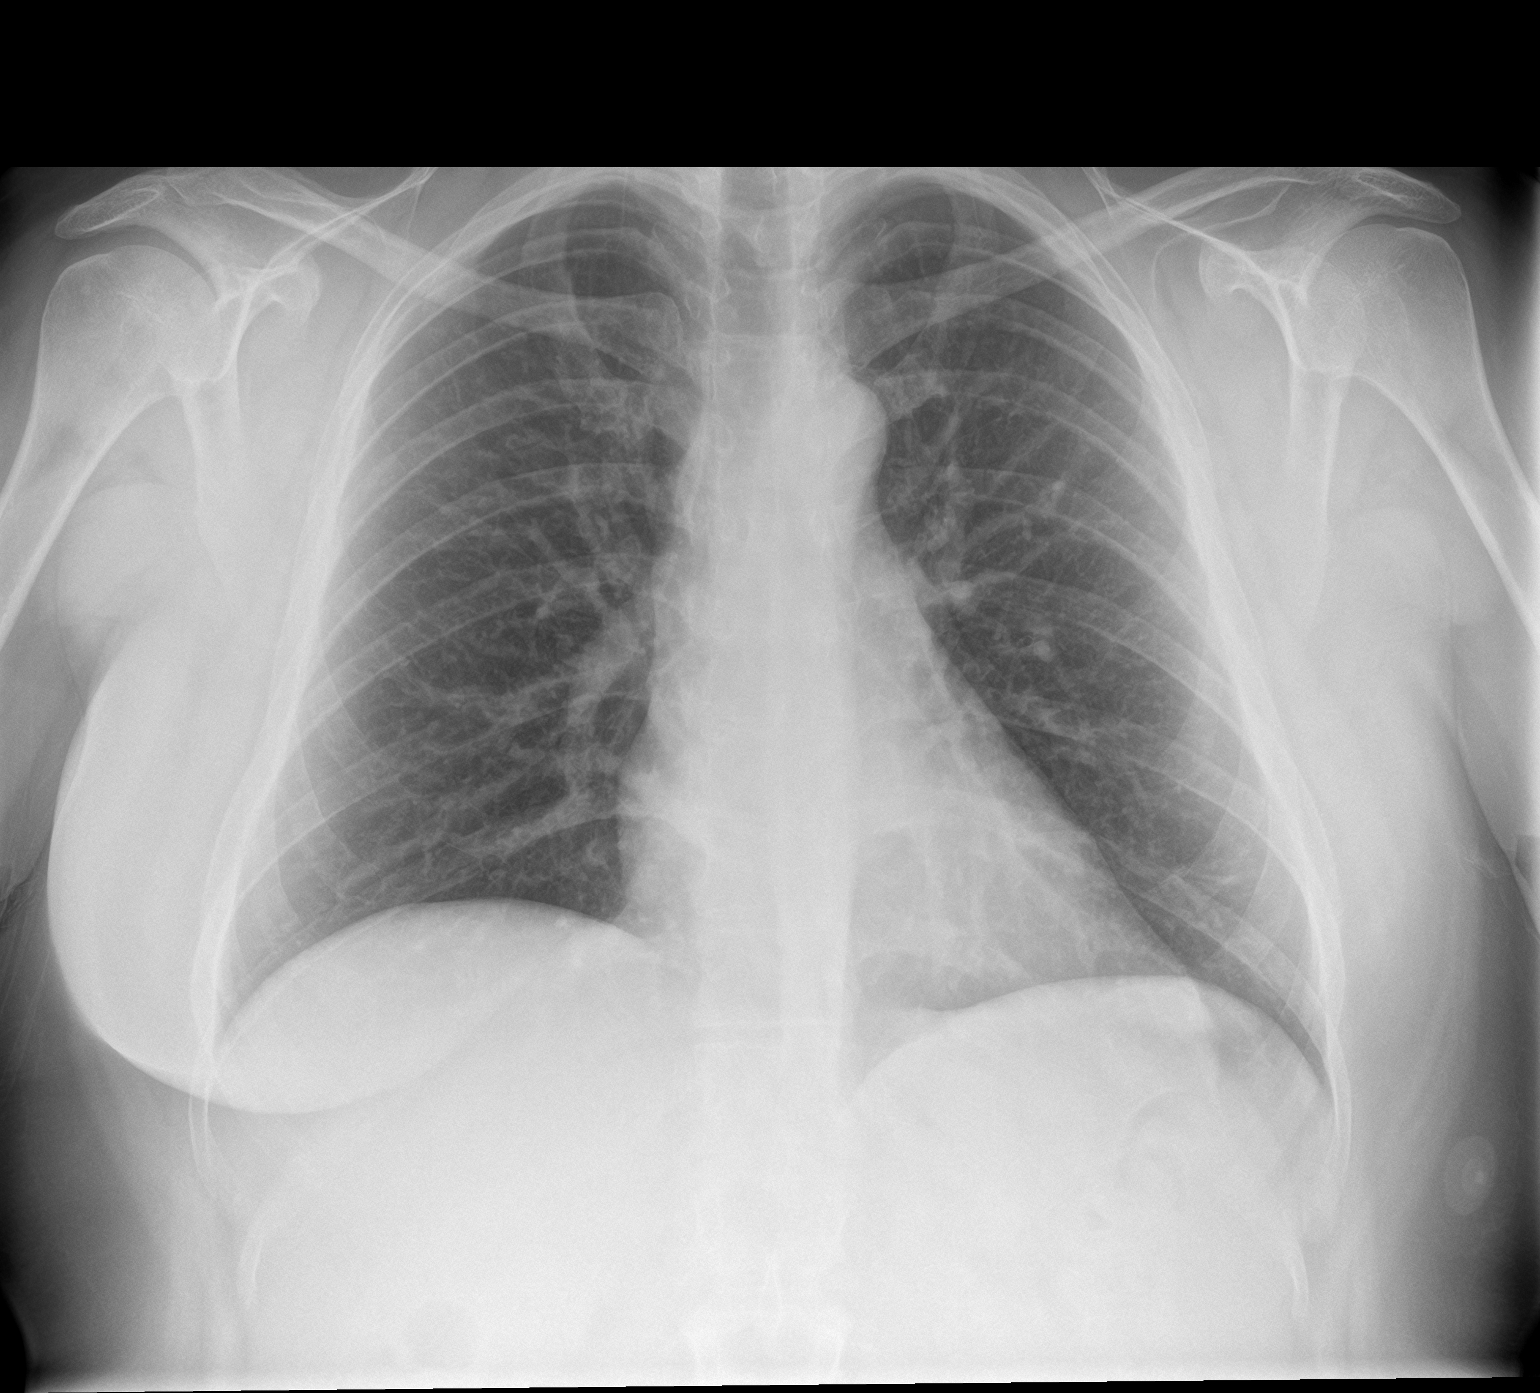

[chest lat]
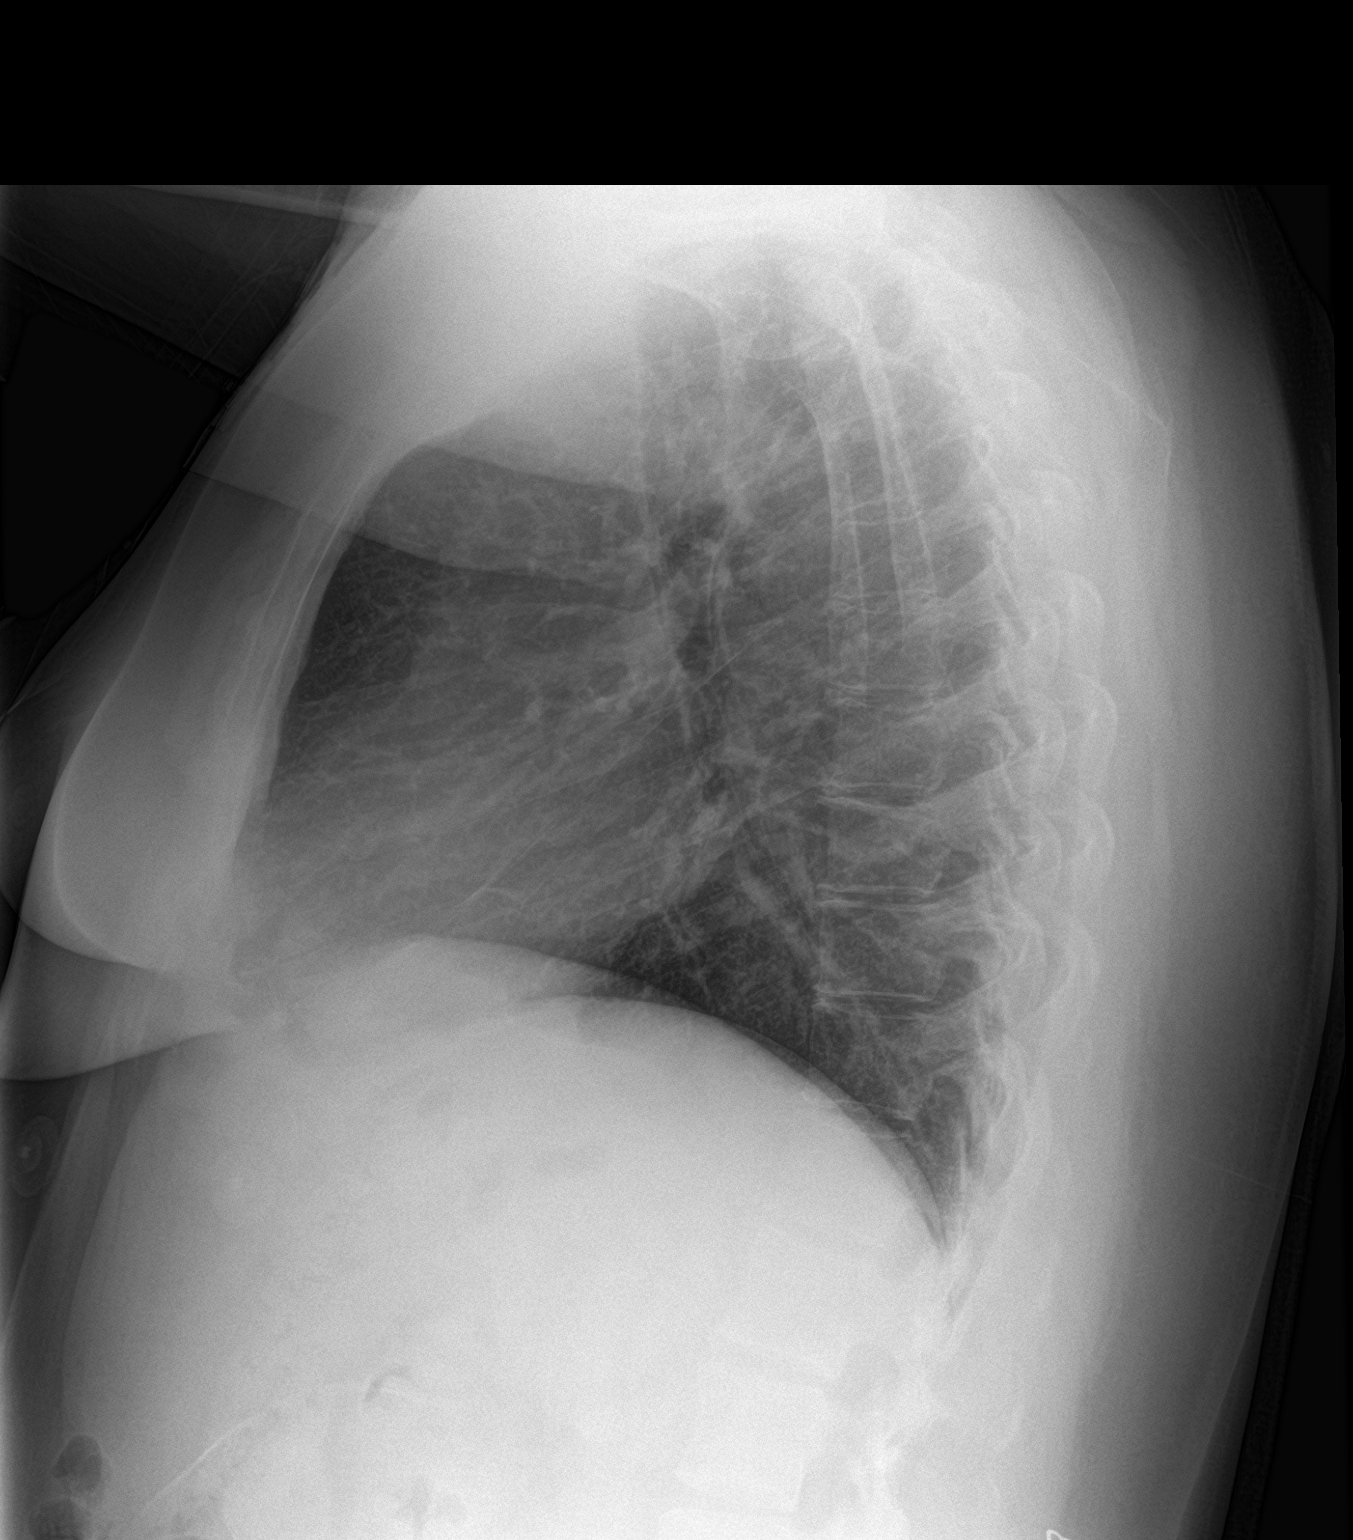

[2 of 2 positions shown; findings below may reference images not displayed]

FINDINGS: Heart size within normal limits.

No appreciable airspace consolidation.No evidence of pleural
effusion or pneumothorax.

No acute bony abnormality identified.
IMPRESSION: No evidence of active cardiopulmonary disease.

## 2020-07-27 NOTE — ED Triage Notes (Signed)
Pt with mid CP for 6 days, denies any SOB.  Pt seen at Urgent Care and sent here.

## 2020-08-04 ENCOUNTER — Other Ambulatory Visit: Payer: Self-pay

## 2020-08-04 ENCOUNTER — Encounter: Payer: Self-pay | Admitting: Cardiology

## 2020-08-04 ENCOUNTER — Ambulatory Visit (INDEPENDENT_AMBULATORY_CARE_PROVIDER_SITE_OTHER): Payer: BC Managed Care – PPO | Admitting: Cardiology

## 2020-08-04 VITALS — BP 132/84 | HR 82 | Ht 67.0 in | Wt 196.0 lb

## 2020-08-04 DIAGNOSIS — I5032 Chronic diastolic (congestive) heart failure: Secondary | ICD-10-CM

## 2020-08-04 DIAGNOSIS — R0789 Other chest pain: Secondary | ICD-10-CM | POA: Diagnosis not present

## 2020-08-04 DIAGNOSIS — I10 Essential (primary) hypertension: Secondary | ICD-10-CM

## 2020-08-04 MED ORDER — FUROSEMIDE 20 MG PO TABS: 20.0000 mg | ORAL_TABLET | Freq: Every day | ORAL | 3 refills | Status: DC | PRN

## 2020-08-04 NOTE — Progress Notes (Signed)
Clinical Summary Maria Lang is a 40 y.o.female seen today as a new patient for the following medical problems. Last seen in our office in 2017   1. Chronic diastolic HF - 0/6301 echo: LVEF 60-65%, mild LVH, grade I diastolic dysfunction. Normal RV - swelling in legs at times.    2. Chest pain - some episodes last week of chest heaviness.  - recent ER visit, trops neg x 2. EKG benign, CXR benign - heaviness midchest, to right side. Not positional. Constant several days in a row, resolving. No other associated symptoms. - symptoms are starting to resolve    3. HTN she reports history of HTN starting at age 73. She is unaware of any prior seconday HTN workup. - no heavy NSAID use. No EtOH. No tobacco. Reports a history of OSA along with CPAP noncompliance. She has upcoming appointment with Dr Annye Rusk - since last visit her labs showed normal renal function, elevated TSH at 6.93 with normal free T4 and T3, renin 3, aldo 6, cortisol 5.5.  - 08/2015 normal renal artery [place]  - compliant with meds    4. OSA - reports abnormal sleep study about 2 years ago at [facility]. Previously followed by Dr Redmond Pulling - not using CPAP due to discomfort, machine is broken now.  - last visit we referred to Dr Luan Pulling to assist with her OSA and CPAP issues  - still not wearing cpap machine, not comfortable - she is giving some thought whether to be reevaluated - did see Dr Rexene Alberts 02/2019  5. History of CVA - admission 11/2018, involving ventral right pons - followed by neuro  Past Medical History:  Diagnosis Date  . Anxiety   . Deviated septum   . GERD (gastroesophageal reflux disease)   . History of bilateral salpingectomy   . Hypertension   . Renal stones   . Sleep apnea    Doesn't use everyday     Allergies  Allergen Reactions  . Hydrocodeine [Dihydrocodeine] Shortness Of Breath and Nausea And Vomiting  . Hydrocodone-Acetaminophen Shortness Of Breath and Nausea Only  . Coreg  [Carvedilol]     Headache   . Labetalol Other (See Comments)    Head itches     Current Outpatient Medications  Medication Sig Dispense Refill  . ALPRAZolam (XANAX) 1 MG tablet Take 0.5-1 tablets by mouth 3 (three) times daily as needed for anxiety.  1  . amLODipine (NORVASC) 10 MG tablet Take 1 tablet (10 mg total) by mouth daily. 30 tablet 0  . aspirin EC 81 MG EC tablet Take 1 tablet (81 mg total) by mouth daily. 120 tablet 0  . atorvastatin (LIPITOR) 40 MG tablet Take 1 tablet (40 mg total) by mouth daily at 6 PM. 60 tablet 0  . ezetimibe (ZETIA) 10 MG tablet Take 1 tablet (10 mg total) by mouth daily. 90 tablet 3  . levothyroxine (SYNTHROID) 75 MCG tablet Take 75 mcg by mouth daily before breakfast.    . losartan-hydrochlorothiazide (HYZAAR) 50-12.5 MG tablet TAKE ONE TABLET BY MOUTH ONCE DAILY    . ondansetron (ZOFRAN ODT) 4 MG disintegrating tablet Take 1 tablet (4 mg total) by mouth every 6 (six) hours as needed for nausea or vomiting. 120 tablet 2  . pantoprazole (PROTONIX) 40 MG tablet TAKE 1 TABLET BY MOUTH TWICE DAILY BEFORE BREAKFAST AND DINNER 180 tablet 0  . propranolol ER (INDERAL LA) 120 MG 24 hr capsule Take 1 capsule (120 mg total) by mouth daily. Natural Steps  capsule 0  . sertraline (ZOLOFT) 100 MG tablet Take 150 mg by mouth daily.     No current facility-administered medications for this visit.     Past Surgical History:  Procedure Laterality Date  . BILATERAL SALPINGECTOMY  07.2016  . CYSTOSCOPY WITH RETROGRADE PYELOGRAM, URETEROSCOPY AND STENT PLACEMENT Right 07/29/2015   Procedure: CYSTOSCOPY WITH RIGHT RETROGRADE PYELOGRAM, RIGHT URETEROSCOPY AND STENT PLACEMENT;  Surgeon: Irine Seal, MD;  Location: [facility];  Service: Urology;  Laterality: Right;  . ECTOPIC PREGNANCY SURGERY  10/2013  . HOLMIUM LASER APPLICATION Right 08/04/5282   Procedure: HOLMIUM LASER APPLICATION;  Surgeon: Irine Seal, MD;  Location: [facility];  Service: Urology;  Laterality: Right;  . NASAL SEPTUM  SURGERY     doen along with tonsillectomy   . TONSILLECTOMY    . TYMPANOSTOMY     x 3     Allergies  Allergen Reactions  . Hydrocodeine [Dihydrocodeine] Shortness Of Breath and Nausea And Vomiting  . Hydrocodone-Acetaminophen Shortness Of Breath and Nausea Only  . Coreg [Carvedilol]     Headache   . Labetalol Other (See Comments)    Head itches      Family History  Problem Relation Age of Onset  . Hypertension Mother   . Hypertension Father   . Hyperlipidemia Father   . Diabetes Brother   . Diabetes Other      Social History Ms. Josephs reports that she has been smoking cigarettes. She started smoking about 22 years ago. She has a 3.50 pack-year smoking history. She has never used smokeless tobacco. Ms. Brewbaker reports no history of alcohol use.   Review of Systems CONSTITUTIONAL: No weight loss, fever, chills, weakness or fatigue.  HEENT: Eyes: No visual loss, blurred vision, double vision or yellow sclerae.No hearing loss, sneezing, congestion, runny nose or sore throat.  SKIN: No rash or itching.  CARDIOVASCULAR: per hpi RESPIRATORY: No shortness of breath, cough or sputum.  GASTROINTESTINAL: No anorexia, nausea, vomiting or diarrhea. No abdominal pain or blood.  GENITOURINARY: No burning on urination, no polyuria NEUROLOGICAL: No headache, dizziness, syncope, paralysis, ataxia, numbness or tingling in the extremities. No change in bowel or bladder control.  MUSCULOSKELETAL: No muscle, back pain, joint pain or stiffness.  LYMPHATICS: No enlarged nodes. No history of splenectomy.  PSYCHIATRIC: No history of depression or anxiety.  ENDOCRINOLOGIC: No reports of sweating, cold or heat intolerance. No polyuria or polydipsia.  [name] Kitchen   Physical Examination Today's Vitals   08/04/20 0857  BP: 132/84  Pulse: 82  SpO2: 98%  Weight: 196 lb (88.9 kg)  Height: 5\' 7"  (1.702 m)   Body mass index is 30.7 kg/m.  Gen: resting comfortably, no acute distress HEENT: no  scleral icterus, pupils equal round and reactive, no palptable cervical adenopathy,  CV: RRR, no m/r/g, no jvd Resp: Clear to auscultation bilaterally GI: abdomen is soft, non-tender, non-distended, normal bowel sounds, no hepatosplenomegaly MSK: extremities are warm, no edema.  Skin: warm, no rash Neuro:  no focal deficits Psych: appropriate affect   Diagnostic Studies 11/2014 echo Study Conclusions  - Left ventricle: The cavity size was normal. Wall thickness was increased in a pattern of mild LVH. Systolic function was normal. The estimated ejection fraction was in the range of 60% to 65%. Doppler parameters are consistent with abnormal left ventricular relaxation (grade 1 diastolic dysfunction).  11/2014 CT PE IMPRESSION: 1. No evidence of pulmonary embolus. 2. Lungs clear bilaterally.    Assessment and Plan  1. Resistant HTN - prior  workup as reported above for secondary causes overall benign other than known OSA history. She remains resistant to CPAP - bp's controlled today, continue current therapy.   2. Chronic diastolic HF - LE edema at times, start lasix 20mg  prn  3. Chest pain Noncardiac in descrtiption, mid to right chest lasting days at a time constant - negative workup in ER - no ischemic testing planned at this time  F/u 4 months      Arnoldo Lenis, M.D.

## 2020-08-04 NOTE — Patient Instructions (Signed)
Medication Instructions:  Your physician recommends that you continue on your current medications as directed. Please refer to the Current Medication list given to you today.  *If you need a refill on your cardiac medications before your next appointment, please call your pharmacy*   Lab Work: NONE   If you have labs (blood work) drawn today and your tests are completely normal, you will receive your results only by: [name] Kitchen MyChart Message (if you have MyChart) OR . A paper copy in the mail If you have any lab test that is abnormal or we need to change your treatment, we will call you to review the results.   Testing/Procedures: NONE    Follow-Up: At [facility], you and your health needs are our priority.  As part of our continuing mission to provide you with exceptional heart care, we have created designated Provider Care Teams.  These Care Teams include your primary Cardiologist (physician) and Advanced Practice Providers (APPs -  Physician Assistants and Nurse Practitioners) who all work together to provide you with the care you need, when you need it.  We recommend signing up for the patient portal called "MyChart".  Sign up information is provided on this After Visit Summary.  MyChart is used to connect with patients for Virtual Visits (Telemedicine).  Patients are able to view lab/test results, encounter notes, upcoming appointments, etc.  Non-urgent messages can be sent to your provider as well.   To learn more about what you can do with MyChart, go to [email or web address].    Your next appointment:   4 month(s)  The format for your next appointment:   In Person  Provider:   You may see Carlyle Dolly, MD or one of the following Advanced Practice Providers on your designated Care Team:    Bernerd Pho, PA-C   Ermalinda Barrios, [place]     Other Instructions Thank you for choosing [facility]!

## 2020-10-27 ENCOUNTER — Other Ambulatory Visit (HOSPITAL_COMMUNITY): Payer: Self-pay | Admitting: Physician Assistant

## 2020-10-27 DIAGNOSIS — M898X1 Other specified disorders of bone, shoulder: Secondary | ICD-10-CM

## 2020-10-27 DIAGNOSIS — M25512 Pain in left shoulder: Secondary | ICD-10-CM

## 2020-11-01 ENCOUNTER — Other Ambulatory Visit (HOSPITAL_COMMUNITY): Payer: Self-pay | Admitting: Physician Assistant

## 2020-11-01 ENCOUNTER — Ambulatory Visit (HOSPITAL_COMMUNITY)
Admission: RE | Admit: 2020-11-01 | Discharge: 2020-11-01 | Disposition: A | Discharge status: Home / Self Care | Payer: BC Managed Care – PPO | Source: Ambulatory Visit | Attending: Physician Assistant | Admitting: Physician Assistant

## 2020-11-01 ENCOUNTER — Other Ambulatory Visit: Payer: Self-pay

## 2020-11-01 DIAGNOSIS — M898X1 Other specified disorders of bone, shoulder: Secondary | ICD-10-CM | POA: Insufficient documentation

## 2020-11-01 DIAGNOSIS — M25512 Pain in left shoulder: Secondary | ICD-10-CM

## 2020-11-01 IMAGING — DX DG SHOULDER 2+V*L*
2 series · 2 of 2 positions shown · non-contrast
Comparison: None.

CLINICAL DATA: Acute left shoulder pain for several weeks, no known
injury, initial encounter

EXAM:
LEFT SHOULDER - 2+ VIEW

[shoulder grashey]
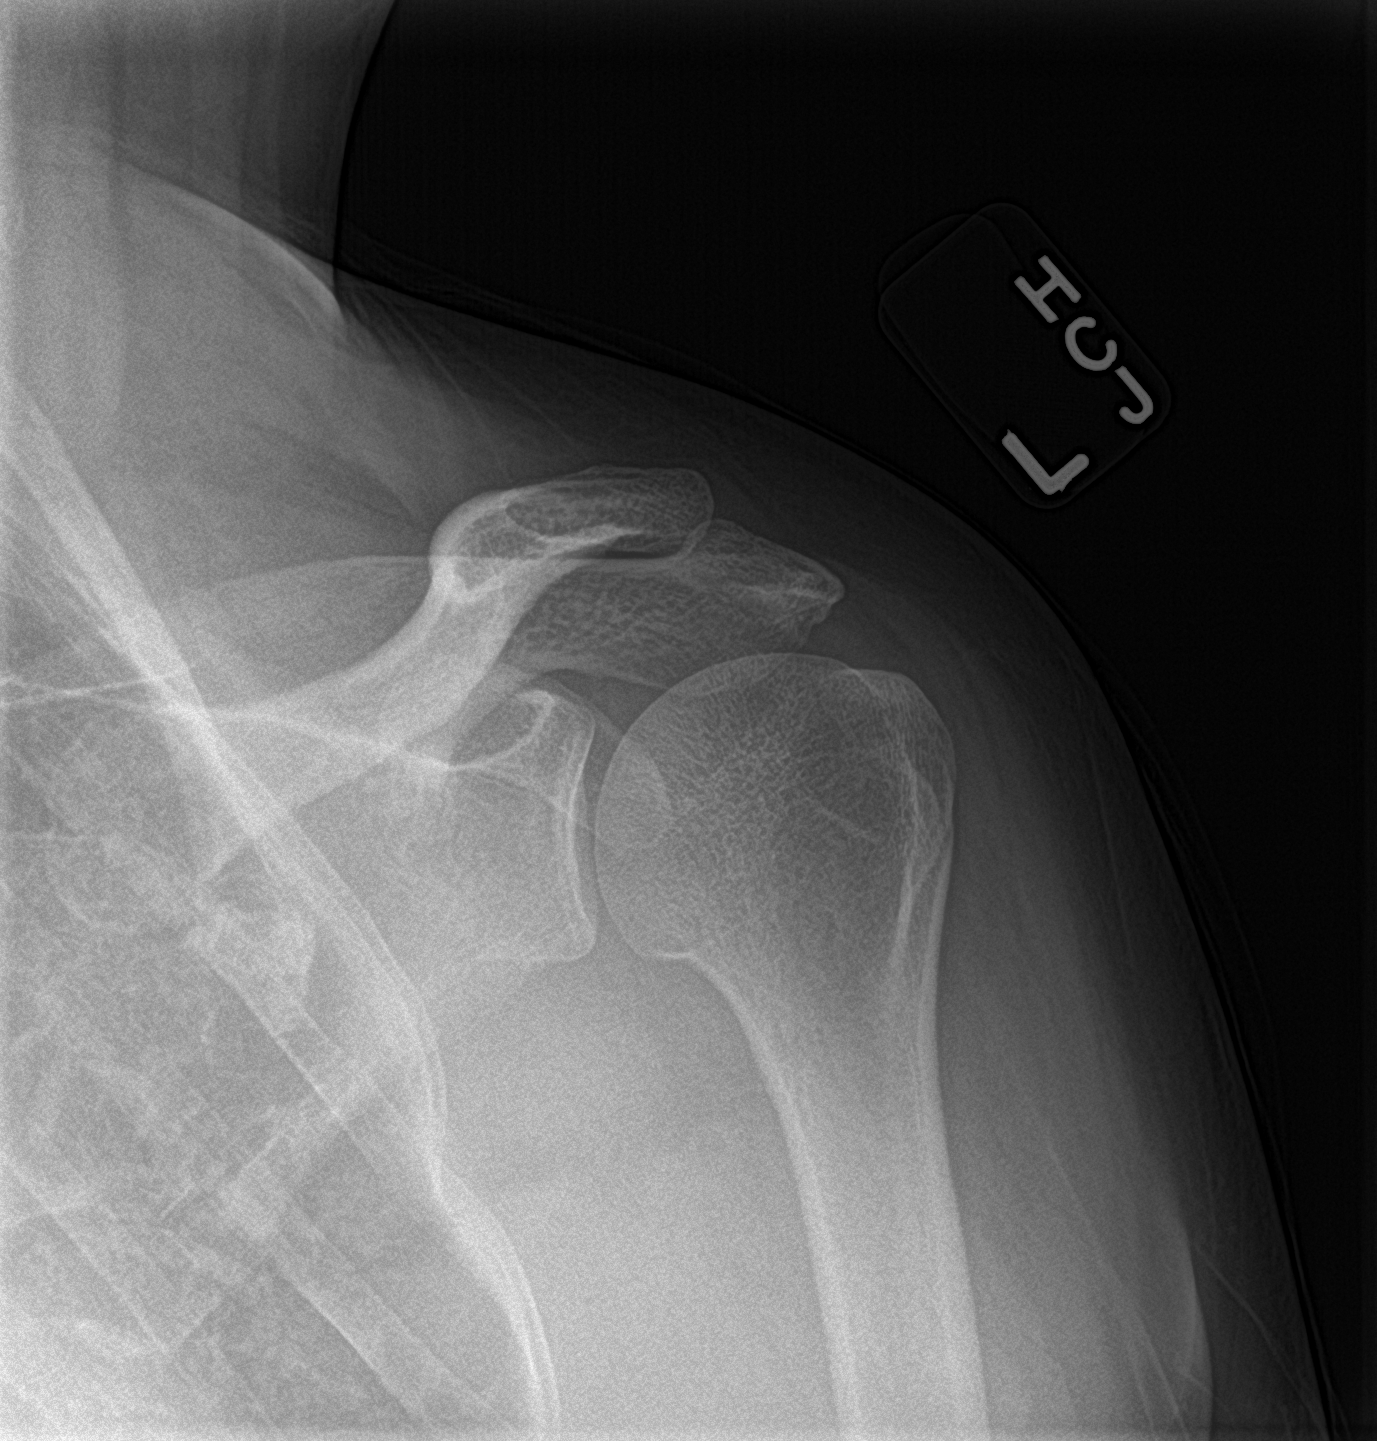

[shoulder y view]
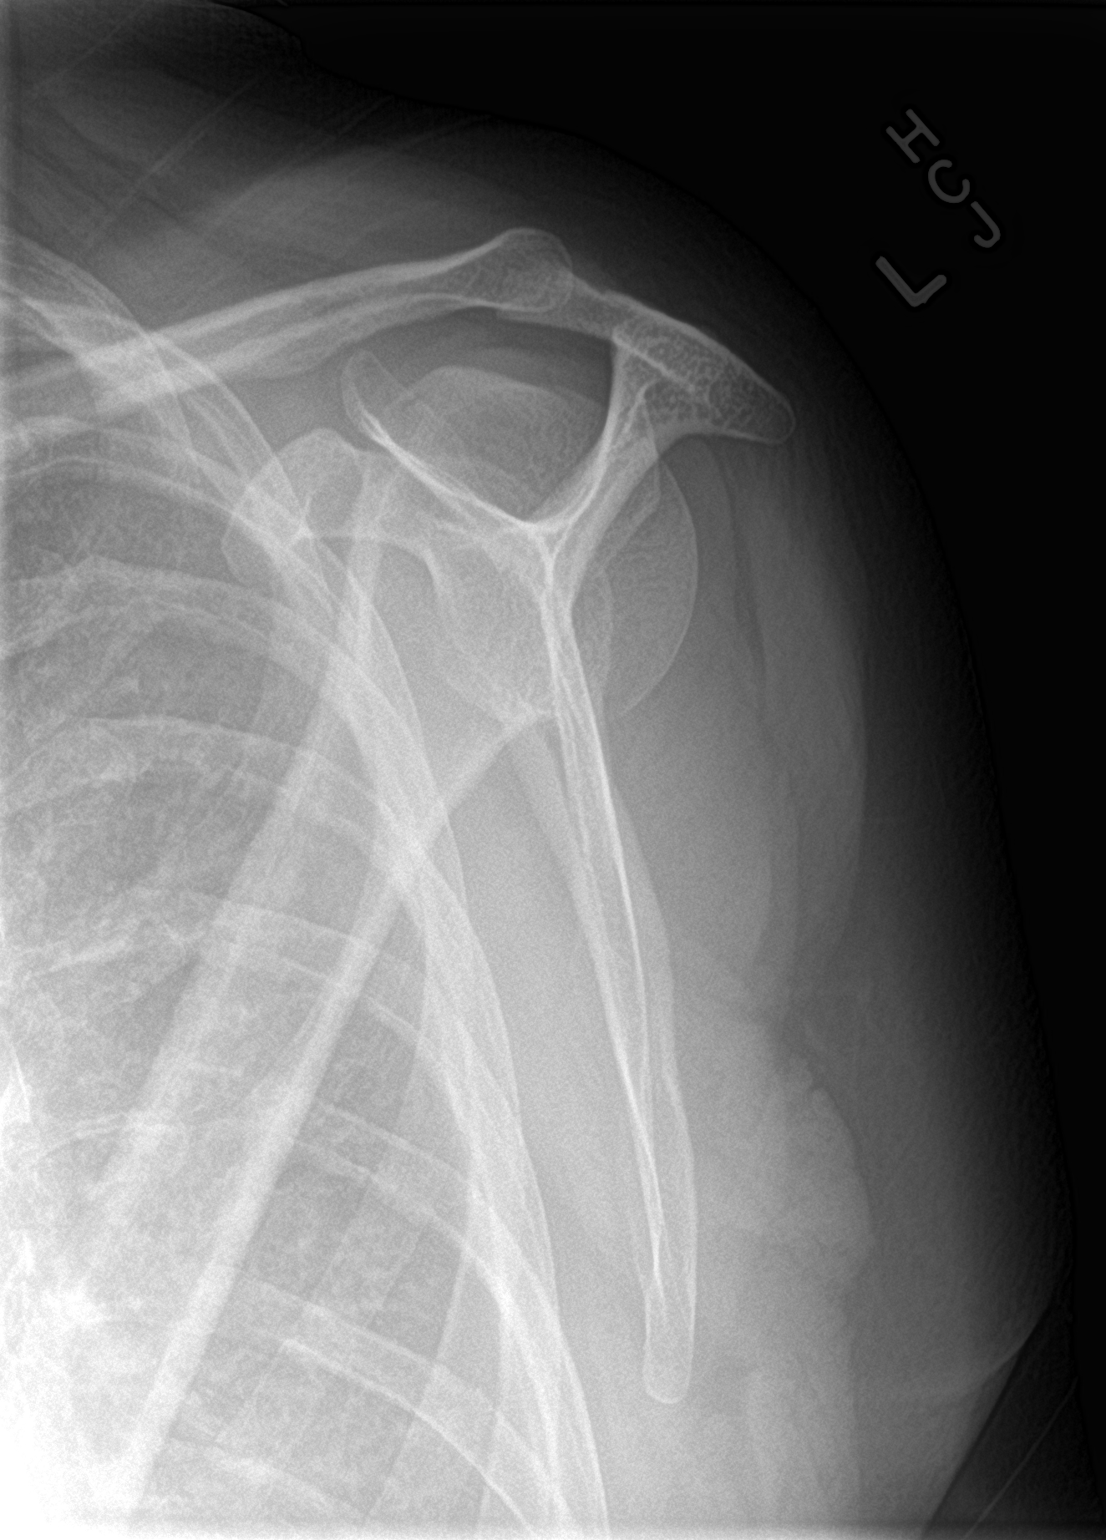

[2 of 2 positions shown; findings below may reference images not displayed]

FINDINGS: There is no evidence of fracture or dislocation. There is no
evidence of arthropathy or other focal bone abnormality. Soft
tissues are unremarkable.
IMPRESSION: No acute abnormality noted.

## 2020-11-01 IMAGING — DX DG CLAVICLE*L*
2 series · 2 of 2 positions shown · non-contrast
Comparison: [DATE]

CLINICAL DATA: Left clavicle pain for several weeks, no known
injury, initial encounter

EXAM:
LEFT CLAVICLE - 2+ VIEWS

[clavicle ap]
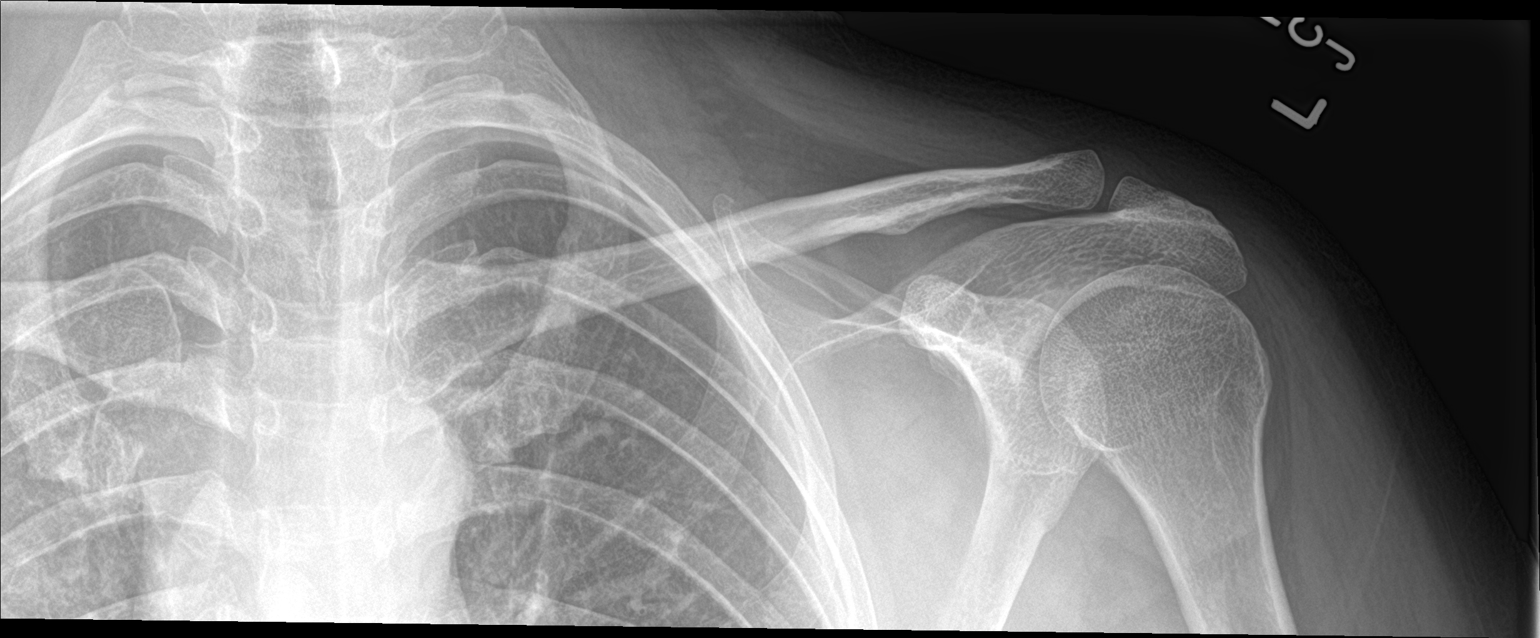

[clavicle axial]
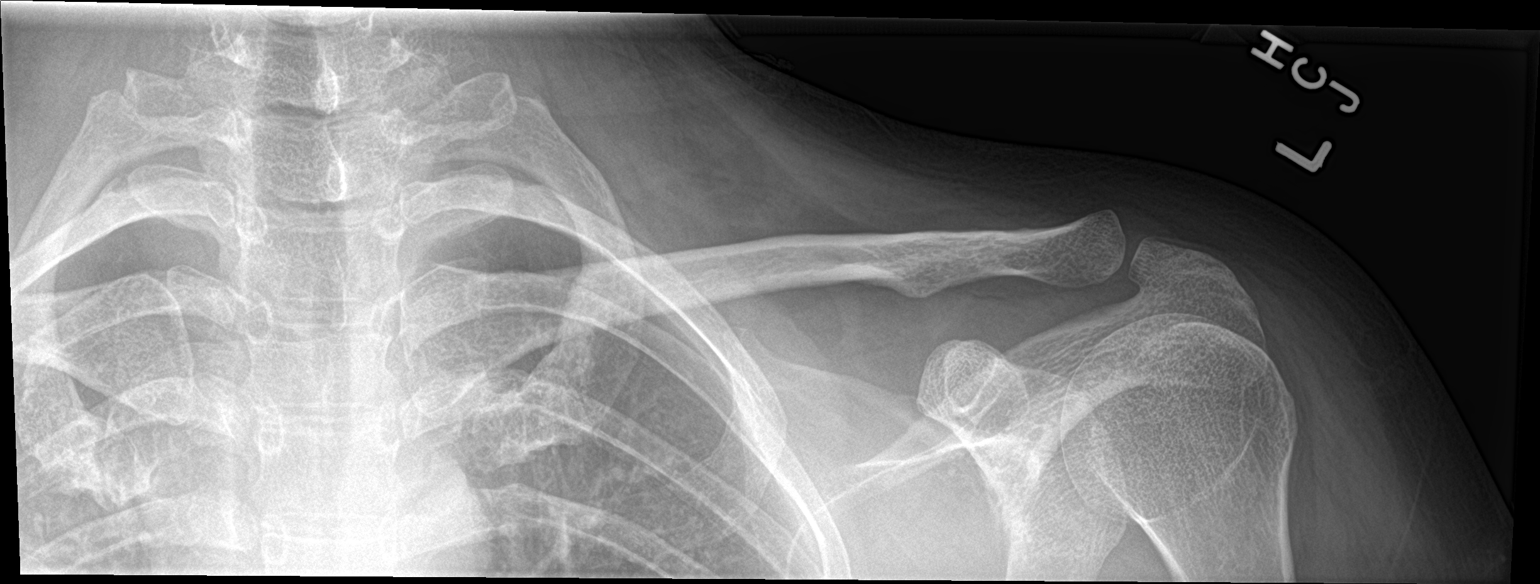

[2 of 2 positions shown; findings below may reference images not displayed]

FINDINGS: There is no evidence of fracture or other focal bone lesions. Soft
tissues are unremarkable.
IMPRESSION: No acute abnormality noted.

## 2020-11-24 ENCOUNTER — Encounter: Payer: Self-pay | Admitting: Emergency Medicine

## 2020-11-24 ENCOUNTER — Ambulatory Visit
Admission: EM | Admit: 2020-11-24 | Discharge: 2020-11-24 | Disposition: A | Discharge status: Home / Self Care | Payer: BC Managed Care – PPO | Attending: Family Medicine | Admitting: Family Medicine

## 2020-11-24 ENCOUNTER — Other Ambulatory Visit: Payer: Self-pay

## 2020-11-24 DIAGNOSIS — M5442 Lumbago with sciatica, left side: Secondary | ICD-10-CM

## 2020-11-24 MED ORDER — PREDNISONE 10 MG (21) PO TBPK: ORAL_TABLET | Freq: Every day | ORAL | 0 refills | Status: AC

## 2020-11-24 MED ORDER — CYCLOBENZAPRINE HCL 10 MG PO TABS: 10.0000 mg | ORAL_TABLET | Freq: Two times a day (BID) | ORAL | 0 refills | Status: DC | PRN

## 2020-11-24 NOTE — Discharge Instructions (Signed)
I have sent in a prednisone taper for you to take for 6 days. 6 tablets on day one, 5 tablets on day two, 4 tablets on day three, 3 tablets on day four, 2 tablets on day five, and 1 tablet on day six.  I have sent in flexeril for you to take twice a day as needed for muscle spasms. This medication can make you sleepy. Do not drive or operate heavy machinery with this medication.  Follow up with this office or with primary care if symptoms are persisting.  Follow up in the ER for high fever, trouble swallowing, trouble breathing, other concerning symptoms.

## 2020-11-24 NOTE — ED Triage Notes (Signed)
Low LT back pain x 1 week.  Unsure of any injury

## 2020-11-24 NOTE — ED Provider Notes (Signed)
[facility]   [id number] 11/24/20 Arrival Time: 0953  HF:WYOVZ PAIN  SUBJECTIVE: History from: patient. Maria Lang is a 40 y.o. female complains of left low back pain that began a week ago. Denies a precipitating event or specific injury. Localizes the pain to the L low back. Describes the pain as intermittent and achy in character with intermittent sharp pain that radiates down the left leg. Has tried OTC medications without relief. Symptoms are made worse with activity.  Denies similar symptoms in the past. Denies fever, chills, erythema, ecchymosis, effusion, weakness, numbness and tingling, saddle paresthesias, loss of bowel or bladder function.      ROS: As per HPI.  All other pertinent ROS negative.     Past Medical History:  Diagnosis Date  . Anxiety   . Deviated septum   . GERD (gastroesophageal reflux disease)   . History of bilateral salpingectomy   . Hypertension   . Renal stones   . Sleep apnea    Doesn't use everyday   Past Surgical History:  Procedure Laterality Date  . BILATERAL SALPINGECTOMY  07.2016  . CYSTOSCOPY WITH RETROGRADE PYELOGRAM, URETEROSCOPY AND STENT PLACEMENT Right 07/29/2015   Procedure: CYSTOSCOPY WITH RIGHT RETROGRADE PYELOGRAM, RIGHT URETEROSCOPY AND STENT PLACEMENT;  Surgeon: Irine Seal, MD;  Location: [facility];  Service: Urology;  Laterality: Right;  . ECTOPIC PREGNANCY SURGERY  10/2013  . HOLMIUM LASER APPLICATION Right 01/28/8849   Procedure: HOLMIUM LASER APPLICATION;  Surgeon: Irine Seal, MD;  Location: [facility];  Service: Urology;  Laterality: Right;  . NASAL SEPTUM SURGERY     doen along with tonsillectomy   . TONSILLECTOMY    . TYMPANOSTOMY     x 3   Allergies  Allergen Reactions  . Hydrocodeine [Dihydrocodeine] Shortness Of Breath and Nausea And Vomiting  . Hydrocodone-Acetaminophen Shortness Of Breath and Nausea Only  . Coreg [Carvedilol]     Headache   . Labetalol Other (See Comments)    Head itches   No current  facility-administered medications on file prior to encounter.   Current Outpatient Medications on File Prior to Encounter  Medication Sig Dispense Refill  . ALPRAZolam (XANAX) 1 MG tablet Take 0.5-1 tablets by mouth 3 (three) times daily as needed for anxiety.  1  . amLODipine (NORVASC) 10 MG tablet Take 1 tablet (10 mg total) by mouth daily. 30 tablet 0  . aspirin EC 81 MG EC tablet Take 1 tablet (81 mg total) by mouth daily. 120 tablet 0  . atorvastatin (LIPITOR) 40 MG tablet Take 1 tablet (40 mg total) by mouth daily at 6 PM. 60 tablet 0  . ezetimibe (ZETIA) 10 MG tablet Take 1 tablet (10 mg total) by mouth daily. 90 tablet 3  . furosemide (LASIX) 20 MG tablet Take 1 tablet (20 mg total) by mouth daily as needed for edema. 90 tablet 3  . levothyroxine (SYNTHROID) 75 MCG tablet Take 75 mcg by mouth daily before breakfast.    . losartan-hydrochlorothiazide (HYZAAR) 50-12.5 MG tablet Take 1 tablet by mouth daily.    . ondansetron (ZOFRAN ODT) 4 MG disintegrating tablet Take 1 tablet (4 mg total) by mouth every 6 (six) hours as needed for nausea or vomiting. 120 tablet 2  . pantoprazole (PROTONIX) 40 MG tablet TAKE 1 TABLET BY MOUTH TWICE DAILY BEFORE BREAKFAST AND DINNER 180 tablet 0  . propranolol ER (INDERAL LA) 120 MG 24 hr capsule Take 1 capsule (120 mg total) by mouth daily. 30 capsule 0  . sertraline (  ZOLOFT) 100 MG tablet Take 150 mg by mouth daily.    [name] Kitchen terbinafine (LAMISIL) 250 MG tablet Take 250 mg by mouth daily.     Social History   Socioeconomic History  . Marital status: Married    Spouse name: Not on file  . Number of children: Not on file  . Years of education: Not on file  . Highest education level: Not on file  Occupational History  . Not on file  Tobacco Use  . Smoking status: Current Every Day Smoker    Packs/day: 0.25    Years: 14.00    Pack years: 3.50    Types: Cigarettes    Start date: 10/04/1997    Last attempt to quit: 06/10/2014    Years since quitting:  6.4  . Smokeless tobacco: Never Used  Vaping Use  . Vaping Use: Never used  Substance and Sexual Activity  . Alcohol use: No  . Drug use: No  . Sexual activity: Not on file  Other Topics Concern  . Not on file  Social History Narrative  . Not on file   Social Determinants of Health   Financial Resource Strain: Not on file  Food Insecurity: Not on file  Transportation Needs: Not on file  Physical Activity: Not on file  Stress: Not on file  Social Connections: Not on file  Intimate Partner Violence: Not on file   Family History  Problem Relation Age of Onset  . Hypertension Mother   . Hypertension Father   . Hyperlipidemia Father   . Diabetes Brother   . Diabetes Other     OBJECTIVE:  Vitals:   11/24/20 1009  BP: 133/86  Pulse: 72  Resp: 18  Temp: 98.2 F (36.8 C)  TempSrc: Oral  SpO2: 98%    General appearance: ALERT; in no acute distress.  Head: NCAT Lungs: Normal respiratory effort CV: pulses 2+ bilaterally. Cap refill < 2 seconds Musculoskeletal:  Inspection: Skin warm, dry, clear and intact No erythema, effusion to low back Palpation: L low back tender to palpation ROM: Limited ROM active and passive with bending, twisting, changing positions, + Left SLR Skin: warm and dry Neurologic: Ambulates without difficulty; Sensation intact about the upper/ lower extremities Psychological: alert and cooperative; normal mood and affect  DIAGNOSTIC STUDIES:  No results found.   ASSESSMENT & PLAN:  1. Acute left-sided low back pain with left-sided sciatica    Meds ordered this encounter  Medications  . predniSONE (STERAPRED UNI-PAK 21 TAB) 10 MG (21) TBPK tablet    Sig: Take by mouth daily for 6 days. Take 6 tablets on day 1, 5 tablets on day 2, 4 tablets on day 3, 3 tablets on day 4, 2 tablets on day 5, 1 tablet on day 6    Dispense:  21 tablet    Refill:  0    Order Specific Question:   Supervising Provider    Answer:   Chase Picket [id number]  .  cyclobenzaprine (FLEXERIL) 10 MG tablet    Sig: Take 1 tablet (10 mg total) by mouth 2 (two) times daily as needed for muscle spasms.    Dispense:  20 tablet    Refill:  0    Order Specific Question:   Supervising Provider    Answer:   Chase Picket [id number]    Prescribed steroid taper Prescribed cyclobenzaprine Continue conservative management of rest, ice, and gentle stretches Take cyclobenzaprine at nighttime for symptomatic relief. Avoid driving or operating heavy machinery  while using medication. Follow up with PCP if symptoms persist Return or go to the ER if you have any new or worsening symptoms (fever, chills, chest pain, abdominal pain, changes in bowel or bladder habits, pain radiating into lower legs)   Reviewed expectations re: course of current medical issues. Questions answered. Outlined signs and symptoms indicating need for more acute intervention. Patient verbalized understanding. After Visit Summary given.       Faustino Congress, NP 11/24/20 1458

## 2020-12-21 ENCOUNTER — Ambulatory Visit: Payer: BC Managed Care – PPO | Admitting: Cardiology

## 2020-12-21 NOTE — Progress Notes (Deleted)
Clinical Summary Maria Lang is a 40 y.o.female 1. Chronic diastolic HF - 01/1828 echo: LVEF 60-65%, mild LVH, grade I diastolic dysfunction. Normal RV - swelling in legs at times.      2. Chest pain - some episodes last week of chest heaviness. - recent ER visit, trops neg x 2. EKG benign, CXR benign - heaviness midchest, to right side. Not positional. Constant several days in a row, resolving. No other associated symptoms. - symptoms are starting to resolve      3. HTN she reports history of HTN starting at age 81. She is unaware of any prior seconday HTN workup. - no heavy NSAID use. No EtOH. No tobacco. Reports a history of OSA along with CPAP noncompliance. She has upcoming appointment with Dr Annye Rusk  - since last visit her labs showed normal renal function, elevated TSH at 6.93 with normal free T4 and T3, renin 3, aldo 6, cortisol 5.5.  - 08/2015 normal renal artery [place]   - compliant with meds       4. OSA - reports abnormal sleep study about 2 years ago at [facility]. Previously followed by Dr Redmond Pulling - not using CPAP due to discomfort, machine is broken now. - last visit we referred to Dr Luan Pulling to assist with her OSA and CPAP issues   - still not wearing cpap machine, not comfortable - she is giving some thought whether to be reevaluated - did see Dr Rexene Alberts 02/2019   5. History of CVA - admission 11/2018, involving ventral right pons - followed by neuro   Past Medical History:  Diagnosis Date   Anxiety    Deviated septum    GERD (gastroesophageal reflux disease)    History of bilateral salpingectomy    Hypertension    Renal stones    Sleep apnea    Doesn't use everyday     Allergies  Allergen Reactions   Hydrocodeine [Dihydrocodeine] Shortness Of Breath and Nausea And Vomiting   Hydrocodone-Acetaminophen Shortness Of Breath and Nausea Only   Coreg [Carvedilol]     Headache    Labetalol Other (See Comments)    Head itches     Current Outpatient  Medications  Medication Sig Dispense Refill   ALPRAZolam (XANAX) 1 MG tablet Take 0.5-1 tablets by mouth 3 (three) times daily as needed for anxiety.  1   amLODipine (NORVASC) 10 MG tablet Take 1 tablet (10 mg total) by mouth daily. 30 tablet 0   aspirin EC 81 MG EC tablet Take 1 tablet (81 mg total) by mouth daily. 120 tablet 0   atorvastatin (LIPITOR) 40 MG tablet Take 1 tablet (40 mg total) by mouth daily at 6 PM. 60 tablet 0   cyclobenzaprine (FLEXERIL) 10 MG tablet Take 1 tablet (10 mg total) by mouth 2 (two) times daily as needed for muscle spasms. 20 tablet 0   ezetimibe (ZETIA) 10 MG tablet Take 1 tablet (10 mg total) by mouth daily. 90 tablet 3   furosemide (LASIX) 20 MG tablet Take 1 tablet (20 mg total) by mouth daily as needed for edema. 90 tablet 3   levothyroxine (SYNTHROID) 75 MCG tablet Take 75 mcg by mouth daily before breakfast.     losartan-hydrochlorothiazide (HYZAAR) 50-12.5 MG tablet Take 1 tablet by mouth daily.     ondansetron (ZOFRAN ODT) 4 MG disintegrating tablet Take 1 tablet (4 mg total) by mouth every 6 (six) hours as needed for nausea or vomiting. 120 tablet 2   pantoprazole (  PROTONIX) 40 MG tablet TAKE 1 TABLET BY MOUTH TWICE DAILY BEFORE BREAKFAST AND DINNER 180 tablet 0   propranolol ER (INDERAL LA) 120 MG 24 hr capsule Take 1 capsule (120 mg total) by mouth daily. 30 capsule 0   sertraline (ZOLOFT) 100 MG tablet Take 150 mg by mouth daily.     terbinafine (LAMISIL) 250 MG tablet Take 250 mg by mouth daily.     No current facility-administered medications for this visit.     Past Surgical History:  Procedure Laterality Date   BILATERAL SALPINGECTOMY  07.2016   CYSTOSCOPY WITH RETROGRADE PYELOGRAM, URETEROSCOPY AND STENT PLACEMENT Right 07/29/2015   Procedure: CYSTOSCOPY WITH RIGHT RETROGRADE PYELOGRAM, RIGHT URETEROSCOPY AND STENT PLACEMENT;  Surgeon: Irine Seal, MD;  Location: [facility];  Service: Urology;  Laterality: Right;   ECTOPIC PREGNANCY SURGERY  10/2013    HOLMIUM LASER APPLICATION Right 0/02/7352   Procedure: HOLMIUM LASER APPLICATION;  Surgeon: Irine Seal, MD;  Location: [facility];  Service: Urology;  Laterality: Right;   NASAL SEPTUM SURGERY     doen along with tonsillectomy    TONSILLECTOMY     TYMPANOSTOMY     x 3     Allergies  Allergen Reactions   Hydrocodeine [Dihydrocodeine] Shortness Of Breath and Nausea And Vomiting   Hydrocodone-Acetaminophen Shortness Of Breath and Nausea Only   Coreg [Carvedilol]     Headache    Labetalol Other (See Comments)    Head itches      Family History  Problem Relation Age of Onset   Hypertension Mother    Hypertension Father    Hyperlipidemia Father    Diabetes Brother    Diabetes Other      Social History Maria Lang reports that she has been smoking cigarettes. She started smoking about 23 years ago. She has a 3.50 pack-year smoking history. She has never used smokeless tobacco. Maria Lang reports no history of alcohol use.   Review of Systems CONSTITUTIONAL: No weight loss, fever, chills, weakness or fatigue.  HEENT: Eyes: No visual loss, blurred vision, double vision or yellow sclerae.No hearing loss, sneezing, congestion, runny nose or sore throat.  SKIN: No rash or itching.  CARDIOVASCULAR:  RESPIRATORY: No shortness of breath, cough or sputum.  GASTROINTESTINAL: No anorexia, nausea, vomiting or diarrhea. No abdominal pain or blood.  GENITOURINARY: No burning on urination, no polyuria NEUROLOGICAL: No headache, dizziness, syncope, paralysis, ataxia, numbness or tingling in the extremities. No change in bowel or bladder control.  MUSCULOSKELETAL: No muscle, back pain, joint pain or stiffness.  LYMPHATICS: No enlarged nodes. No history of splenectomy.  PSYCHIATRIC: No history of depression or anxiety.  ENDOCRINOLOGIC: No reports of sweating, cold or heat intolerance. No polyuria or polydipsia.  [name] Kitchen   Physical Examination There were no vitals filed for this visit. There  were no vitals filed for this visit.  Gen: resting comfortably, no acute distress HEENT: no scleral icterus, pupils equal round and reactive, no palptable cervical adenopathy,  CV Resp: Clear to auscultation bilaterally GI: abdomen is soft, non-tender, non-distended, normal bowel sounds, no hepatosplenomegaly MSK: extremities are warm, no edema.  Skin: warm, no rash Neuro:  no focal deficits Psych: appropriate affect   Diagnostic Studies 11/2014 echo Study Conclusions  - Left ventricle: The cavity size was normal. Wall thickness was   increased in a pattern of mild LVH. Systolic function was normal.   The estimated ejection fraction was in the range of 60% to 65%.   Doppler parameters are consistent with  abnormal left ventricular   relaxation (grade 1 diastolic dysfunction).   11/2014 CT PE IMPRESSION: 1. No evidence of pulmonary embolus. 2. Lungs clear bilaterally.      Assessment and Plan   1. Resistant HTN - prior workup as reported above for secondary causes overall benign other than known OSA history. She remains resistant to CPAP - bp's controlled today, continue current therapy.    2. Chronic diastolic HF - LE edema at times, start lasix 20mg  prn   3. Chest pain Noncardiac in descrtiption, mid to right chest lasting days at a time constant - negative workup in ER - no ischemic testing planned at this time     Arnoldo Lenis, M.D., F.A.C.C.

## 2021-02-17 ENCOUNTER — Telehealth: Payer: Self-pay | Admitting: Cardiology

## 2021-02-17 NOTE — Telephone Encounter (Signed)
New message    Pt c/o BP issue: STAT if pt c/o blurred vision, one-sided weakness or slurred speech  1. What are your last 5 BP readings? 140/103   2. Are you having any other symptoms (ex. Dizziness, headache, blurred vision, passed out)? Headache feels like her heart is gonna jump out of her chest   3. What is your BP issue? Bp is high

## 2021-02-17 NOTE — Telephone Encounter (Signed)
I spoke with patient and asked her to repeat BP which was 147/96, HR 84   She confirms she is taking Amlodipine 10 mg and Inderal 120 mg daily. She will  keep daily BP log and has f/u apt with [place] next week.      I will FYI Dr.Branch

## 2021-02-18 NOTE — Telephone Encounter (Signed)
Will evaluate at appt next week  Maria Abts MD

## 2021-02-21 ENCOUNTER — Ambulatory Visit: Payer: BC Managed Care – PPO | Admitting: Cardiology

## 2021-02-25 ENCOUNTER — Ambulatory Visit: Payer: BC Managed Care – PPO | Admitting: Cardiology

## 2021-03-03 NOTE — Progress Notes (Deleted)
Cardiology Office Note  Date: 03/04/2021   ID: Maria Lang, DOB [birth year], MRN [id number]  PCP:  Jillyn Hidden, PA-C  Cardiologist:  Carlyle Dolly, MD Electrophysiologist:  None   Chief Complaint: BP HIGH  History of Present Illness: Maria Lang is a 40 y.o. female with a history of hypertension, GERD, kidney stones, sleep apnea, anxiety, CVA, hypothyroidism, smoker. [name] Kitchen She was last seen by Dr. Harl Bowie on 08/04/2020 for follow-up of chronic diastolic heart failure, chest pain, resistant hypertension.  Last echo on 11/2018 EF 60 to 65%, mild LVH, G1 DD, normal RV.  Having some leg swelling at times.  Also experiencing some episodes of chest heaviness the prior week.  She had a recent emergency room visit with negative troponins x2.  EKG was negative for acute changes.  Chest x-ray was negative.  She described heaviness in mid chest to the right side not aggravated by position.  Could happen several days in a row then resolving.  Had no other associated symptoms.  Symptoms were starting to resolve.  She reported her only diagnosis of hypertension at age 26.  Awareness of secondary hypertension work-up.  She reported using CPAP but history of noncompliance.  Since prior visit labs showed normal renal function, TSH was 6.935 normal free T4 and T3.  [place]-M-30, aldosterone 6, cortisol 5.5.  Normal renal artery ultrasound in March 2017.  She was compliant with medications.  Previous admission for CVA on June 2020 involving ventral right pons.  She was followed by neurology.  BP was controlled at that visit.  There were no changes to therapy.  Lasix 20 mg as needed was started for periodic lower extremity edema.  Chest pain was noncardiac in description, mid to right chest lasting days at a time constant.  Had a negative work-up in the emergency room.  No ischemic testing was planned at that time.   Past Medical History:  Diagnosis Date   Anxiety    Deviated septum    GERD  (gastroesophageal reflux disease)    History of bilateral salpingectomy    Hypertension    Renal stones    Sleep apnea    Doesn't use everyday    Past Surgical History:  Procedure Laterality Date   BILATERAL SALPINGECTOMY  07.2016   CYSTOSCOPY WITH RETROGRADE PYELOGRAM, URETEROSCOPY AND STENT PLACEMENT Right 07/29/2015   Procedure: CYSTOSCOPY WITH RIGHT RETROGRADE PYELOGRAM, RIGHT URETEROSCOPY AND STENT PLACEMENT;  Surgeon: Irine Seal, MD;  Location: [facility];  Service: Urology;  Laterality: Right;   ECTOPIC PREGNANCY SURGERY  10/2013   HOLMIUM LASER APPLICATION Right [id number]   Procedure: HOLMIUM LASER APPLICATION;  Surgeon: Irine Seal, MD;  Location: [facility];  Service: Urology;  Laterality: Right;   NASAL SEPTUM SURGERY     doen along with tonsillectomy    TONSILLECTOMY     TYMPANOSTOMY     x 3    Current Outpatient Medications  Medication Sig Dispense Refill   ALPRAZolam (XANAX) 1 MG tablet Take 0.5-1 tablets by mouth 3 (three) times daily as needed for anxiety.  1   amLODipine (NORVASC) 10 MG tablet Take 1 tablet (10 mg total) by mouth daily. 30 tablet 0   aspirin EC 81 MG EC tablet Take 1 tablet (81 mg total) by mouth daily. 120 tablet 0   atorvastatin (LIPITOR) 40 MG tablet Take 1 tablet (40 mg total) by mouth daily at 6 PM. 60 tablet 0   cyclobenzaprine (FLEXERIL) 10 MG tablet Take 1 tablet (10 mg total) by  mouth 2 (two) times daily as needed for muscle spasms. 20 tablet 0   ezetimibe (ZETIA) 10 MG tablet Take 1 tablet (10 mg total) by mouth daily. 90 tablet 3   furosemide (LASIX) 20 MG tablet Take 1 tablet (20 mg total) by mouth daily as needed for edema. 90 tablet 3   levothyroxine (SYNTHROID) 75 MCG tablet Take 75 mcg by mouth daily before breakfast.     losartan-hydrochlorothiazide (HYZAAR) 50-12.5 MG tablet Take 1 tablet by mouth daily.     ondansetron (ZOFRAN ODT) 4 MG disintegrating tablet Take 1 tablet (4 mg total) by mouth every 6 (six) hours as needed for nausea or  vomiting. 120 tablet 2   pantoprazole (PROTONIX) 40 MG tablet TAKE 1 TABLET BY MOUTH TWICE DAILY BEFORE BREAKFAST AND DINNER 180 tablet 0   propranolol ER (INDERAL LA) 120 MG 24 hr capsule Take 1 capsule (120 mg total) by mouth daily. 30 capsule 0   sertraline (ZOLOFT) 100 MG tablet Take 150 mg by mouth daily.     terbinafine (LAMISIL) 250 MG tablet Take 250 mg by mouth daily.     No current facility-administered medications for this visit.   Allergies:  Hydrocodeine [dihydrocodeine], Hydrocodone-acetaminophen, Coreg [carvedilol], and Labetalol   Social History: The patient  reports that she has been smoking cigarettes. She started smoking about 23 years ago. She has a 3.50 pack-year smoking history. She has never used smokeless tobacco. She reports that she does not drink alcohol and does not use drugs.   Family History: The patient's family history includes Diabetes in her brother and another family member; Hyperlipidemia in her father; Hypertension in her father and mother.   ROS:  Please see the history of present illness. Otherwise, complete review of systems is positive for {NONE DEFAULTED:18576}.  All other systems are reviewed and negative.   Physical Exam: VS:  There were no vitals taken for this visit., BMI There is no height or weight on file to calculate BMI.  Wt Readings from Last 3 Encounters:  08/04/20 196 lb (88.9 kg)  07/27/20 190 lb (86.2 kg)  03/17/19 194 lb (88 kg)    General: Patient appears comfortable at rest. HEENT: Conjunctiva and lids normal, oropharynx clear with moist mucosa. Neck: Supple, no elevated JVP or carotid bruits, no thyromegaly. Lungs: Clear to auscultation, nonlabored breathing at rest. Cardiac: Regular rate and rhythm, no S3 or significant systolic murmur, no pericardial rub. Abdomen: Soft, nontender, no hepatomegaly, bowel sounds present, no guarding or rebound. Extremities: No pitting edema, distal pulses 2+. Skin: Warm and  dry. Musculoskeletal: No kyphosis. Neuropsychiatric: Alert and oriented x3, affect grossly appropriate.  ECG:  {EKG/Telemetry Strips Reviewed:[id number]}  Recent Labwork: 07/27/2020: BUN 13; Creatinine, Ser 0.91; Hemoglobin 11.2; Platelets 285; Potassium 3.5; Sodium 136     Component Value Date/Time   CHOL 235 (H) 12/21/2018 1121   TRIG 298 (H) 12/21/2018 1121   HDL 29 (L) 12/21/2018 1121   CHOLHDL 8.1 12/21/2018 1121   VLDL 60 (H) 12/21/2018 1121   LDLCALC 146 (H) 12/21/2018 1121    Other Studies Reviewed Today:  Diagnostic Studies 11/2014 echo Study Conclusions  - Left ventricle: The cavity size was normal. Wall thickness was   increased in a pattern of mild LVH. Systolic function was normal.   The estimated ejection fraction was in the range of 60% to 65%.   Doppler parameters are consistent with abnormal left ventricular   relaxation (grade 1 diastolic dysfunction).   11/2014 CT PE IMPRESSION: 1.  No evidence of pulmonary embolus. 2. Lungs clear bilaterally.   Assessment and Plan:  1. Resistant hypertension   2. Chronic diastolic heart failure ([place])   3. History of CVA (cerebrovascular accident)      Medication Adjustments/Labs and Tests Ordered: Current medicines are reviewed at length with the patient today.  Concerns regarding medicines are outlined above.   Disposition: Follow-up with ***  Signed, Levell July, NP 03/04/2021 7:56 AM    [place] [facility] [place], [place], [place] [address] Phone: [phone]; Fax: [phone]

## 2021-03-04 ENCOUNTER — Ambulatory Visit: Payer: BC Managed Care – PPO | Admitting: Family Medicine

## 2021-03-04 DIAGNOSIS — I10 Essential (primary) hypertension: Secondary | ICD-10-CM

## 2021-03-04 DIAGNOSIS — Z8673 Personal history of transient ischemic attack (TIA), and cerebral infarction without residual deficits: Secondary | ICD-10-CM

## 2021-03-04 DIAGNOSIS — I5032 Chronic diastolic (congestive) heart failure: Secondary | ICD-10-CM

## 2021-05-31 ENCOUNTER — Telehealth: Payer: Self-pay | Admitting: Cardiology

## 2021-05-31 DIAGNOSIS — Z79899 Other long term (current) drug therapy: Secondary | ICD-10-CM

## 2021-05-31 MED ORDER — LOSARTAN POTASSIUM-HCTZ 100-12.5 MG PO TABS: 1.0000 | ORAL_TABLET | Freq: Every day | ORAL | 6 refills | Status: DC

## 2021-05-31 NOTE — Telephone Encounter (Signed)
Pt c/o BP issue: STAT if pt c/o blurred vision, one-sided weakness or slurred speech  1. What are your last 5 BP readings?   (All sitting)  12/04: 199/121 108  12/05: 149/99  76  176/108 75  12/06: 166/105 76   167/105 96  2. Are you having any other symptoms (ex. Dizziness, headache, blurred vision, passed out)?  Weakness, fatigue  3. What is your BP issue?   Patient states her BP has been extremely elevated. She states she is at work and requested a detailed message if she is unavailable when her call is returned.

## 2021-05-31 NOTE — Telephone Encounter (Signed)
I spoke with patient and she states her bp was "ok" until this past week. Denies dietary indiscretions. She was supposed to f/u 02/21/21 and cancelled. Then she cancelled 9/21 apt and no showed 9/9. She states "things got in the way"    She confirmed she is taking amlodipine 10 mg and Inderal 120 mg     I will message Dr.Branch

## 2021-05-31 NOTE — Telephone Encounter (Signed)
Can whe increase hyzaar to 100mg /12.5 mg tablet once daily. Needs bmet 2 weeks after change  Zandra Abts MD

## 2021-05-31 NOTE — Telephone Encounter (Signed)
I spoke with patient and she will increase Hyzaar to 100/12.5 mg qd and repeat bmet in 2 weeks at [facility] out patient lab

## 2021-08-02 ENCOUNTER — Other Ambulatory Visit: Payer: Self-pay

## 2021-08-02 ENCOUNTER — Ambulatory Visit
Admission: EM | Admit: 2021-08-02 | Discharge: 2021-08-02 | Disposition: A | Discharge status: Home / Self Care | Payer: BC Managed Care – PPO | Attending: Family Medicine | Admitting: Family Medicine

## 2021-08-02 DIAGNOSIS — R112 Nausea with vomiting, unspecified: Secondary | ICD-10-CM

## 2021-08-02 DIAGNOSIS — R1084 Generalized abdominal pain: Secondary | ICD-10-CM | POA: Diagnosis not present

## 2021-08-02 DIAGNOSIS — R519 Headache, unspecified: Secondary | ICD-10-CM

## 2021-08-02 MED ORDER — ONDANSETRON 4 MG PO TBDP: 4.0000 mg | ORAL_TABLET | Freq: Three times a day (TID) | ORAL | 0 refills | Status: DC | PRN

## 2021-08-02 NOTE — ED Provider Notes (Signed)
[facility]    CSN: [id number] Arrival date & time: 08/02/21  1018      History   Chief Complaint Chief Complaint  Patient presents with   Emesis   Headache    HPI Maria Lang is a 41 y.o. female.   Presenting today with 1 day history of headache that was worse yesterday and has slacked off some today, nausea, vomiting.  Denies fever, chills, chest pain, shortness of breath, upper respiratory symptoms, urinary symptoms.  Trying over-the-counter pain relievers, fluids with minimal relief.  No known sick contacts recently.   Past Medical History:  Diagnosis Date   Anxiety    Deviated septum    GERD (gastroesophageal reflux disease)    History of bilateral salpingectomy    Hypertension    Renal stones    Sleep apnea    Doesn't use everyday    Patient Active Problem List   Diagnosis Date Noted   Acute CVA (cerebrovascular accident) ([place]) 12/21/2018   Sleep apnea    Anxiety    Benzodiazepine dependence ([place])    Acute Left-sided weakness    Acute Speech abnormality    Smoker    Hyperglycemia    Hypothyroidism    Chest pain 12/10/2014   GERD (gastroesophageal reflux disease) 12/10/2014   Chronic diastolic CHF (congestive heart failure) ([place]) 12/10/2014   Hypertension    Pain in the chest    Hypertensive emergency     Past Surgical History:  Procedure Laterality Date   BILATERAL SALPINGECTOMY  07.2016   CYSTOSCOPY WITH RETROGRADE PYELOGRAM, URETEROSCOPY AND STENT PLACEMENT Right 07/29/2015   Procedure: CYSTOSCOPY WITH RIGHT RETROGRADE PYELOGRAM, RIGHT URETEROSCOPY AND STENT PLACEMENT;  Surgeon: Irine Seal, MD;  Location: [facility];  Service: Urology;  Laterality: Right;   ECTOPIC PREGNANCY SURGERY  10/2013   HOLMIUM LASER APPLICATION Right 07/02/5100   Procedure: HOLMIUM LASER APPLICATION;  Surgeon: Irine Seal, MD;  Location: [facility];  Service: Urology;  Laterality: Right;   NASAL SEPTUM SURGERY     doen along with tonsillectomy    TONSILLECTOMY      TYMPANOSTOMY     x 3    OB History     Gravida  4   Para  3   Term  3   Preterm      AB  1   Living  3      SAB      IAB      Ectopic  1   Multiple      Live Births               Home Medications    Prior to Admission medications   Medication Sig Start Date End Date Taking? Authorizing Provider  ondansetron (ZOFRAN-ODT) 4 MG disintegrating tablet Take 1 tablet (4 mg total) by mouth every 8 (eight) hours as needed for nausea or vomiting. 08/02/21  Yes Volney American, PA-C  ALPRAZolam Duanne Moron) 1 MG tablet Take 0.5-1 tablets by mouth 3 (three) times daily as needed for anxiety. 01/01/17   [provider]  amLODipine (NORVASC) 10 MG tablet Take 1 tablet (10 mg total) by mouth daily. 12/05/14   Evalee Jefferson, PA-C  aspirin EC 81 MG EC tablet Take 1 tablet (81 mg total) by mouth daily. 12/23/18   Kayleen Memos, DO  atorvastatin (LIPITOR) 40 MG tablet Take 1 tablet (40 mg total) by mouth daily at 6 PM. 12/23/18   Kayleen Memos, DO  cyclobenzaprine (FLEXERIL) 10 MG tablet  Take 1 tablet (10 mg total) by mouth 2 (two) times daily as needed for muscle spasms. 11/24/20   Faustino Congress, NP  ezetimibe (ZETIA) 10 MG tablet Take 1 tablet (10 mg total) by mouth daily. 03/05/19   Frann Rider, NP  furosemide (LASIX) 20 MG tablet Take 1 tablet (20 mg total) by mouth daily as needed for edema. 08/04/20 11/02/20  Arnoldo Lenis, MD  levothyroxine (SYNTHROID) 75 MCG tablet Take 75 mcg by mouth daily before breakfast. 02/28/19   [provider]  losartan-hydrochlorothiazide (HYZAAR) 100-12.5 MG tablet Take 1 tablet by mouth daily. 05/31/21   Arnoldo Lenis, MD  ondansetron (ZOFRAN ODT) 4 MG disintegrating tablet Take 1 tablet (4 mg total) by mouth every 6 (six) hours as needed for nausea or vomiting. 10/16/17   Levin Erp, PA  pantoprazole (PROTONIX) 40 MG tablet TAKE 1 TABLET BY MOUTH TWICE DAILY BEFORE BREAKFAST AND DINNER 11/04/18   Ladene Artist,  MD  propranolol ER (INDERAL LA) 120 MG 24 hr capsule Take 1 capsule (120 mg total) by mouth daily. 12/23/18   Kayleen Memos, DO  sertraline (ZOLOFT) 100 MG tablet Take 150 mg by mouth daily. 10/01/18   [provider]  terbinafine (LAMISIL) 250 MG tablet Take 250 mg by mouth daily. 07/29/20   [provider]    Family History Family History  Problem Relation Age of Onset   Hypertension Mother    Hypertension Father    Hyperlipidemia Father    Diabetes Brother    Diabetes Other     Social History Social History   Tobacco Use   Smoking status: Every Day    Packs/day: 0.25    Years: 14.00    Pack years: 3.50    Types: Cigarettes    Start date: 10/04/1997    Last attempt to quit: 06/10/2014    Years since quitting: 7.1   Smokeless tobacco: Never  Vaping Use   Vaping Use: Never used  Substance Use Topics   Alcohol use: No   Drug use: No     Allergies   Hydrocodeine [dihydrocodeine], Hydrocodone-acetaminophen, Coreg [carvedilol], and Labetalol   Review of Systems Review of Systems Per HPI  Physical Exam Triage Vital Signs ED Triage Vitals  Enc Vitals Group     BP 08/02/21 1043 (!) 150/98     Pulse Rate 08/02/21 1043 74     Resp 08/02/21 1043 18     Temp 08/02/21 1043 97.9 F (36.6 C)     Temp src --      SpO2 08/02/21 1043 98 %     Weight --      Height --      Head Circumference --      Peak Flow --      Pain Score 08/02/21 1041 8     Pain Loc --      Pain Edu? --      Excl. in [place]? --    No data found.  Updated Vital Signs BP (!) 150/98    Pulse 74    Temp 97.9 F (36.6 C)    Resp 18    LMP 07/12/2021    SpO2 98%   Visual Acuity Right Eye Distance:   Left Eye Distance:   Bilateral Distance:    Right Eye Near:   Left Eye Near:    Bilateral Near:     Physical Exam Vitals and nursing note reviewed.  Constitutional:      Appearance:  Normal appearance. She is not ill-appearing.  HENT:     Head: Atraumatic.     Mouth/Throat:      Mouth: Mucous membranes are moist.  Eyes:     Extraocular Movements: Extraocular movements intact.     Conjunctiva/sclera: Conjunctivae normal.  Cardiovascular:     Rate and Rhythm: Normal rate and regular rhythm.     Heart sounds: Normal heart sounds.  Pulmonary:     Effort: Pulmonary effort is normal.     Breath sounds: Normal breath sounds. No wheezing or rales.  Abdominal:     General: Bowel sounds are normal. There is no distension.     Palpations: Abdomen is soft.     Tenderness: There is abdominal tenderness. There is no right CVA tenderness, left CVA tenderness or guarding.     Comments: Mild diffuse abdominal tenderness to palpation without guarding or distention  Musculoskeletal:        General: Normal range of motion.     Cervical back: Normal range of motion and neck supple.  Skin:    General: Skin is warm and dry.  Neurological:     Mental Status: She is alert and oriented to person, place, and time.     Motor: No weakness.     Gait: Gait normal.  Psychiatric:        Mood and Affect: Mood normal.        Thought Content: Thought content normal.        Judgment: Judgment normal.   UC Treatments / Results  Labs (all labs ordered are listed, but only abnormal results are displayed) Labs Reviewed - No data to display  EKG  Radiology No results found.  Procedures Procedures (including critical care time)  Medications Ordered in UC Medications - No data to display  Initial Impression / Assessment and Plan / UC Course  I have reviewed the triage vital signs and the nursing notes.  Pertinent labs & imaging results that were available during my care of the patient were reviewed by me and considered in my medical decision making (see chart for details).     Mildly hypertensive in triage, otherwise vital signs reassuring.  Suspect viral GI illness causing symptoms, treat with Zofran, brat diet, fluids, rest.  Work note given.  Return for acutely worsening  symptoms.  Final Clinical Impressions(s) / UC Diagnoses   Final diagnoses:  Nausea and vomiting, unspecified vomiting type  Generalized abdominal pain  Bad headache   Discharge Instructions   None    ED Prescriptions     Medication Sig Dispense Auth. Provider   ondansetron (ZOFRAN-ODT) 4 MG disintegrating tablet Take 1 tablet (4 mg total) by mouth every 8 (eight) hours as needed for nausea or vomiting. 20 tablet Volney American, [place]      PDMP not reviewed this encounter.   Volney American, [place] 08/02/21 1842

## 2021-08-02 NOTE — ED Triage Notes (Signed)
Pt presents with c/o head ache that is just on left side and vomiting that began yesterday

## 2021-08-04 ENCOUNTER — Other Ambulatory Visit: Payer: Self-pay

## 2021-08-04 ENCOUNTER — Ambulatory Visit
Admission: EM | Admit: 2021-08-04 | Discharge: 2021-08-04 | Disposition: A | Discharge status: Home / Self Care | Payer: BC Managed Care – PPO | Attending: Urgent Care | Admitting: Urgent Care

## 2021-08-04 DIAGNOSIS — K219 Gastro-esophageal reflux disease without esophagitis: Secondary | ICD-10-CM

## 2021-08-04 DIAGNOSIS — R197 Diarrhea, unspecified: Secondary | ICD-10-CM | POA: Diagnosis not present

## 2021-08-04 DIAGNOSIS — R112 Nausea with vomiting, unspecified: Secondary | ICD-10-CM

## 2021-08-04 DIAGNOSIS — I10 Essential (primary) hypertension: Secondary | ICD-10-CM

## 2021-08-04 DIAGNOSIS — R110 Nausea: Secondary | ICD-10-CM

## 2021-08-04 DIAGNOSIS — K921 Melena: Secondary | ICD-10-CM

## 2021-08-04 MED ORDER — ACETAMINOPHEN 325 MG PO TABS: 650.0000 mg | ORAL_TABLET | Freq: Four times a day (QID) | ORAL | 0 refills | Status: DC | PRN

## 2021-08-04 MED ORDER — LIDOCAINE VISCOUS HCL 2 % MT SOLN: OROMUCOSAL | 0 refills | Status: DC

## 2021-08-04 MED ORDER — LOPERAMIDE HCL 2 MG PO CAPS: 2.0000 mg | ORAL_CAPSULE | Freq: Two times a day (BID) | ORAL | 0 refills | Status: DC | PRN

## 2021-08-04 NOTE — Discharge Instructions (Addendum)
Please stop using ibuprofen and switch to Tylenol. This will be better for your pain to avoid problems with your blood pressure, upset stomach. However, because you've had bloody and persistent GI symptoms, you should follow up with the GI doctors again. Make sure you follow up with your PCP as soon as possible for better management of your blood pressure and blood pressure medications.

## 2021-08-04 NOTE — ED Provider Notes (Signed)
[place]   MRN: [id number] DOB: 24-Mar-1981  Subjective:   Maria Lang is a 41 y.o. female presenting for recheck on ongoing diarrhea, upset stomach.  She is also had some frontal headaches.  Reports longstanding history of nausea and vomiting.  Takes Zofran very regularly.  Has previously had an endoscopy done but this was years ago.  She was found to have a hiatal hernia, acid reflux.  She is taking Protonix for this daily.  Denies any recent antibiotic use.  However, she has been using ibuprofen daily 3 times a day as prescribed by her PCP.  She did have an episode of bloody stools last month but this resolved and has not had any this month.  No vision changes, confusion, weakness, numbness or tingling, chest pain, shortness of breath.  Patient does have a history of CHF, stroke, central hypertension.  Reports that she takes multiple medications for her blood pressure.  She has never had really good control of the blood pressure but does see her PCP regularly for it.  No recent antibiotic use in the past 2 weeks.  No recent long distance travel outside the country.  Diet routine has been generally the same.  Has never had a colonoscopy.  No current facility-administered medications for this encounter.  Current Outpatient Medications:    ALPRAZolam (XANAX) 1 MG tablet, Take 0.5-1 tablets by mouth 3 (three) times daily as needed for anxiety., Disp: , Rfl: 1   amLODipine (NORVASC) 10 MG tablet, Take 1 tablet (10 mg total) by mouth daily., Disp: 30 tablet, Rfl: 0   aspirin EC 81 MG EC tablet, Take 1 tablet (81 mg total) by mouth daily., Disp: 120 tablet, Rfl: 0   atorvastatin (LIPITOR) 40 MG tablet, Take 1 tablet (40 mg total) by mouth daily at 6 PM., Disp: 60 tablet, Rfl: 0   cyclobenzaprine (FLEXERIL) 10 MG tablet, Take 1 tablet (10 mg total) by mouth 2 (two) times daily as needed for muscle spasms., Disp: 20 tablet, Rfl: 0   ezetimibe (ZETIA) 10 MG tablet, Take 1 tablet (10  mg total) by mouth daily., Disp: 90 tablet, Rfl: 3   furosemide (LASIX) 20 MG tablet, Take 1 tablet (20 mg total) by mouth daily as needed for edema., Disp: 90 tablet, Rfl: 3   levothyroxine (SYNTHROID) 75 MCG tablet, Take 75 mcg by mouth daily before breakfast., Disp: , Rfl:    losartan-hydrochlorothiazide (HYZAAR) 100-12.5 MG tablet, Take 1 tablet by mouth daily., Disp: 30 tablet, Rfl: 6   ondansetron (ZOFRAN ODT) 4 MG disintegrating tablet, Take 1 tablet (4 mg total) by mouth every 6 (six) hours as needed for nausea or vomiting., Disp: 120 tablet, Rfl: 2   ondansetron (ZOFRAN-ODT) 4 MG disintegrating tablet, Take 1 tablet (4 mg total) by mouth every 8 (eight) hours as needed for nausea or vomiting., Disp: 20 tablet, Rfl: 0   pantoprazole (PROTONIX) 40 MG tablet, TAKE 1 TABLET BY MOUTH TWICE DAILY BEFORE BREAKFAST AND DINNER, Disp: 180 tablet, Rfl: 0   propranolol ER (INDERAL LA) 120 MG 24 hr capsule, Take 1 capsule (120 mg total) by mouth daily., Disp: 30 capsule, Rfl: 0   sertraline (ZOLOFT) 100 MG tablet, Take 150 mg by mouth daily., Disp: , Rfl:    terbinafine (LAMISIL) 250 MG tablet, Take 250 mg by mouth daily., Disp: , Rfl:    Allergies  Allergen Reactions   Hydrocodeine [Dihydrocodeine] Shortness Of Breath and Nausea And Vomiting   Hydrocodone-Acetaminophen Shortness Of Breath and Nausea  Only   Coreg [Carvedilol]     Headache    Labetalol Other (See Comments)    Head itches    Past Medical History:  Diagnosis Date   Anxiety    Deviated septum    GERD (gastroesophageal reflux disease)    History of bilateral salpingectomy    Hypertension    Renal stones    Sleep apnea    Doesn't use everyday     Past Surgical History:  Procedure Laterality Date   BILATERAL SALPINGECTOMY  07.2016   CYSTOSCOPY WITH RETROGRADE PYELOGRAM, URETEROSCOPY AND STENT PLACEMENT Right 07/29/2015   Procedure: CYSTOSCOPY WITH RIGHT RETROGRADE PYELOGRAM, RIGHT URETEROSCOPY AND STENT PLACEMENT;  Surgeon:  Irine Seal, MD;  Location: [facility];  Service: Urology;  Laterality: Right;   ECTOPIC PREGNANCY SURGERY  10/2013   HOLMIUM LASER APPLICATION Right 08/02/7822   Procedure: HOLMIUM LASER APPLICATION;  Surgeon: Irine Seal, MD;  Location: [facility];  Service: Urology;  Laterality: Right;   NASAL SEPTUM SURGERY     doen along with tonsillectomy    TONSILLECTOMY     TYMPANOSTOMY     x 3    Family History  Problem Relation Age of Onset   Hypertension Mother    Hypertension Father    Hyperlipidemia Father    Diabetes Brother    Diabetes Other     Social History   Tobacco Use   Smoking status: Former    Packs/day: 0.25    Years: 14.00    Pack years: 3.50    Types: Cigarettes    Start date: 10/04/1997    Quit date: 03/26/2020    Years since quitting: 1.3   Smokeless tobacco: Never  Vaping Use   Vaping Use: Never used  Substance Use Topics   Alcohol use: No   Drug use: Never    ROS   Objective:   Vitals: BP (!) 159/110 (BP Location: Right Arm)    Pulse 73    Temp 98 F (36.7 C) (Oral)    Resp 19    LMP 07/12/2021    SpO2 97%   Physical Exam Constitutional:      General: She is not in acute distress.    Appearance: Normal appearance. She is well-developed. She is not ill-appearing, toxic-appearing or diaphoretic.  HENT:     Head: Normocephalic and atraumatic.     Nose: Nose normal.     Mouth/Throat:     Mouth: Mucous membranes are moist.     Pharynx: Oropharynx is clear.  Eyes:     General: No scleral icterus.       Right eye: No discharge.        Left eye: No discharge.     Extraocular Movements: Extraocular movements intact.     Conjunctiva/sclera: Conjunctivae normal.  Neck:     Meningeal: Brudzinski's sign and Kernig's sign absent.  Cardiovascular:     Rate and Rhythm: Normal rate.     Heart sounds: No murmur heard.   No friction rub. No gallop.  Pulmonary:     Effort: Pulmonary effort is normal. No respiratory distress.     Breath sounds: No stridor. No  wheezing, rhonchi or rales.  Chest:     Chest wall: No tenderness.  Abdominal:     General: Bowel sounds are normal. There is no distension.     Palpations: Abdomen is soft. There is no mass.     Tenderness: There is no abdominal tenderness. There is no right CVA tenderness, left CVA  tenderness, guarding or rebound.  Musculoskeletal:     Cervical back: No rigidity.  Skin:    General: Skin is warm and dry.  Neurological:     General: No focal deficit present.     Mental Status: She is alert and oriented to person, place, and time.     Cranial Nerves: No cranial nerve deficit, dysarthria or facial asymmetry.     Motor: No weakness.     Coordination: Romberg sign negative. Coordination normal.     Gait: Gait normal.     Comments: No facial asymmetry.   Psychiatric:        Mood and Affect: Mood normal.        Behavior: Behavior normal.        Thought Content: Thought content normal.        Judgment: Judgment normal.    Assessment and Plan :   PDMP not reviewed this encounter.  1. Nausea and vomiting, unspecified vomiting type   2. Chronic nausea   3. Gastroesophageal reflux disease, unspecified whether esophagitis present   4. Diarrhea, unspecified type   5. Essential hypertension   6. Bloody stool    No evidence of an acute encephalopathy, stroke.  Her neurologic exam is very normal.  She does have the risk factors since she has had a stroke in the past and is an uncontrolled hypertensive patient.  However as she does not have signs of stroke on exam will defer an ER visit.  Emphasized need for close follow-up with her PCP for review of her blood pressure medications and better blood pressure control.  Recommended switching from ibuprofen to Tylenol for her aches and pains, headaches.  Stop using NSAIDs in general.  Use viscous lidocaine for local pain relief of her teeth.  She does have a dentist appointment coming up in March.  No signs of an acute dental infection.  I do not  suspect a viral stomach infection or an infectious etiology for her stomach symptoms.  Suspect that this is also related to her ibuprofen use.  She has had significant improvement with her nausea and vomiting with the use of Zofran.  Does not need any refills now.  However as she does have some diarrhea will use loperamide.  Follow-up with [facility] gastroenterology soon as possible especially for consideration of work-up given that she has had bloody stools.  I did explain to patient that this may be due to her ibuprofen use but as she had this resolve will defer further work-up through our clinic. Counseled patient on potential for adverse effects with medications prescribed/recommended today, ER and return-to-clinic precautions discussed, patient verbalized understanding.    Jaynee Eagles, PA-C 08/04/21 1352

## 2021-08-04 NOTE — ED Triage Notes (Signed)
Pt reports headache, upset stomach, diarrhea  x 2 days. Headache do not improved with ibuprofen.   Pt reports blood pressure sometimes is high even with medications. Denies chest pain, vision changes, shortness of breat, dizziness.. States this is an ongoing problem.Pt reports she had a stroke 2-3 years ago.

## 2021-08-10 ENCOUNTER — Encounter: Payer: Self-pay | Admitting: Gastroenterology

## 2021-09-05 ENCOUNTER — Ambulatory Visit
Admission: EM | Admit: 2021-09-05 | Discharge: 2021-09-05 | Disposition: A | Discharge status: Home / Self Care | Payer: BC Managed Care – PPO | Attending: Urgent Care | Admitting: Urgent Care

## 2021-09-05 ENCOUNTER — Other Ambulatory Visit: Payer: Self-pay

## 2021-09-05 ENCOUNTER — Encounter: Payer: Self-pay | Admitting: Emergency Medicine

## 2021-09-05 DIAGNOSIS — M545 Low back pain, unspecified: Secondary | ICD-10-CM | POA: Diagnosis present

## 2021-09-05 DIAGNOSIS — E860 Dehydration: Secondary | ICD-10-CM | POA: Diagnosis present

## 2021-09-05 LAB — POCT URINALYSIS DIP (MANUAL ENTRY)
Bilirubin, UA: NEGATIVE
Blood, UA: NEGATIVE
Glucose, UA: NEGATIVE mg/dL
Nitrite, UA: NEGATIVE
Spec Grav, UA: >=1.03 — AB (ref 1.010–1.025)
Urobilinogen, UA: 0.2 E.U./dL
pH, UA: 5.5 (ref 5.0–8.0)

## 2021-09-05 NOTE — ED Triage Notes (Signed)
Pt reports lower back pain since Friday. Denies any known injury. Pt reports history of same with UTI in the past.  ?

## 2021-09-05 NOTE — ED Provider Notes (Signed)
[place]   MRN: [id number] DOB: 1981-01-31  Subjective:   Maria Lang is a 41 y.o. female with PMH of UTI, renal stones presenting for 3-day history of persistent low back pain.  Previously has had UTIs when she gets back pain like this.  She has not had a kidney stone in a while but reports that it feels very different to her current symptoms.  Denies fever, nausea, vomiting, dysuria, urinary frequency, hematuria, falls, trauma.  Does not hydrate very well with water at all.  Drinks coffee, sweet tea, fruit juice, [place].  She does have a urologist but has not seen them for a while.  Has a history of a bilateral salpingectomy.  No current facility-administered medications for this encounter.  Current Outpatient Medications:    acetaminophen (TYLENOL) 325 MG tablet, Take 2 tablets (650 mg total) by mouth every 6 (six) hours as needed for moderate pain., Disp: 90 tablet, Rfl: 0   ALPRAZolam (XANAX) 1 MG tablet, Take 0.5-1 tablets by mouth 3 (three) times daily as needed for anxiety., Disp: , Rfl: 1   amLODipine (NORVASC) 10 MG tablet, Take 1 tablet (10 mg total) by mouth daily., Disp: 30 tablet, Rfl: 0   aspirin EC 81 MG EC tablet, Take 1 tablet (81 mg total) by mouth daily., Disp: 120 tablet, Rfl: 0   atorvastatin (LIPITOR) 40 MG tablet, Take 1 tablet (40 mg total) by mouth daily at 6 PM., Disp: 60 tablet, Rfl: 0   cyclobenzaprine (FLEXERIL) 10 MG tablet, Take 1 tablet (10 mg total) by mouth 2 (two) times daily as needed for muscle spasms., Disp: 20 tablet, Rfl: 0   ezetimibe (ZETIA) 10 MG tablet, Take 1 tablet (10 mg total) by mouth daily., Disp: 90 tablet, Rfl: 3   furosemide (LASIX) 20 MG tablet, Take 1 tablet (20 mg total) by mouth daily as needed for edema., Disp: 90 tablet, Rfl: 3   levothyroxine (SYNTHROID) 75 MCG tablet, Take 75 mcg by mouth daily before breakfast., Disp: , Rfl:    lidocaine (XYLOCAINE) 2 % solution, Apply pea size amount for local pain  relief orally twice daily as needed., Disp: 100 mL, Rfl: 0   loperamide (IMODIUM) 2 MG capsule, Take 1 capsule (2 mg total) by mouth 2 (two) times daily as needed for diarrhea or loose stools., Disp: 14 capsule, Rfl: 0   losartan-hydrochlorothiazide (HYZAAR) 100-12.5 MG tablet, Take 1 tablet by mouth daily., Disp: 30 tablet, Rfl: 6   ondansetron (ZOFRAN ODT) 4 MG disintegrating tablet, Take 1 tablet (4 mg total) by mouth every 6 (six) hours as needed for nausea or vomiting., Disp: 120 tablet, Rfl: 2   ondansetron (ZOFRAN-ODT) 4 MG disintegrating tablet, Take 1 tablet (4 mg total) by mouth every 8 (eight) hours as needed for nausea or vomiting., Disp: 20 tablet, Rfl: 0   pantoprazole (PROTONIX) 40 MG tablet, TAKE 1 TABLET BY MOUTH TWICE DAILY BEFORE BREAKFAST AND DINNER, Disp: 180 tablet, Rfl: 0   propranolol ER (INDERAL LA) 120 MG 24 hr capsule, Take 1 capsule (120 mg total) by mouth daily., Disp: 30 capsule, Rfl: 0   sertraline (ZOLOFT) 100 MG tablet, Take 150 mg by mouth daily., Disp: , Rfl:    terbinafine (LAMISIL) 250 MG tablet, Take 250 mg by mouth daily., Disp: , Rfl:    Allergies  Allergen Reactions   Hydrocodeine [Dihydrocodeine] Shortness Of Breath and Nausea And Vomiting   Hydrocodone-Acetaminophen Shortness Of Breath and Nausea Only   Coreg [Carvedilol]  Headache    Labetalol Other (See Comments)    Head itches    Past Medical History:  Diagnosis Date   Anxiety    Deviated septum    GERD (gastroesophageal reflux disease)    History of bilateral salpingectomy    Hypertension    Renal stones    Sleep apnea    Doesn't use everyday     Past Surgical History:  Procedure Laterality Date   BILATERAL SALPINGECTOMY  07.2016   CYSTOSCOPY WITH RETROGRADE PYELOGRAM, URETEROSCOPY AND STENT PLACEMENT Right 07/29/2015   Procedure: CYSTOSCOPY WITH RIGHT RETROGRADE PYELOGRAM, RIGHT URETEROSCOPY AND STENT PLACEMENT;  Surgeon: Irine Seal, MD;  Location: [facility];  Service: Urology;   Laterality: Right;   ECTOPIC PREGNANCY SURGERY  10/2013   HOLMIUM LASER APPLICATION Right 02/25/6383   Procedure: HOLMIUM LASER APPLICATION;  Surgeon: Irine Seal, MD;  Location: [facility];  Service: Urology;  Laterality: Right;   NASAL SEPTUM SURGERY     doen along with tonsillectomy    TONSILLECTOMY     TYMPANOSTOMY     x 3    Family History  Problem Relation Age of Onset   Hypertension Mother    Hypertension Father    Hyperlipidemia Father    Diabetes Brother    Diabetes Other     Social History   Tobacco Use   Smoking status: Former    Packs/day: 0.25    Years: 14.00    Pack years: 3.50    Types: Cigarettes    Start date: 10/04/1997    Quit date: 03/26/2020    Years since quitting: 1.4   Smokeless tobacco: Never  Vaping Use   Vaping Use: Never used  Substance Use Topics   Alcohol use: No   Drug use: Never    ROS   Objective:   Vitals: BP (!) 131/93 (BP Location: Right Arm)    Pulse 82    Temp 98.1 F (36.7 C) (Oral)    Resp 18    Ht '5\' 7"'$  (1.702 m)    Wt 195 lb (88.5 kg)    LMP 08/14/2021 (Approximate)    SpO2 98%    BMI 30.54 kg/m   Physical Exam Constitutional:      General: She is not in acute distress.    Appearance: Normal appearance. She is well-developed. She is not ill-appearing, toxic-appearing or diaphoretic.  HENT:     Head: Normocephalic and atraumatic.     Right Ear: External ear normal.     Left Ear: External ear normal.     Nose: Nose normal.     Mouth/Throat:     Mouth: Mucous membranes are moist.  Eyes:     General: No scleral icterus.       Right eye: No discharge.        Left eye: No discharge.     Extraocular Movements: Extraocular movements intact.     Conjunctiva/sclera: Conjunctivae normal.  Cardiovascular:     Rate and Rhythm: Normal rate.  Pulmonary:     Effort: Pulmonary effort is normal.  Abdominal:     General: Bowel sounds are normal. There is no distension.     Palpations: Abdomen is soft. There is no mass.      Tenderness: There is no abdominal tenderness. There is no right CVA tenderness, left CVA tenderness, guarding or rebound.  Musculoskeletal:     Comments: Full range of motion throughout.  Strength 5/5 for lower extremities.  Patient ambulates without any assistance at expected pace.  No ecchymosis, swelling, lacerations or abrasions.  Patient does have mild paraspinal muscle tenderness along the lumbar region of her back excluding the midline.  Skin:    General: Skin is warm and dry.  Neurological:     General: No focal deficit present.     Mental Status: She is alert and oriented to person, place, and time.     Motor: No weakness.     Coordination: Coordination normal.     Gait: Gait normal.     Deep Tendon Reflexes: Reflexes normal.  Psychiatric:        Mood and Affect: Mood normal.        Behavior: Behavior normal.        Thought Content: Thought content normal.        Judgment: Judgment normal.   Results for orders placed or performed during the hospital encounter of 09/05/21 (from the past 24 hour(s))  POCT urinalysis dipstick     Status: Abnormal   Collection Time: 09/05/21 10:24 AM  Result Value Ref Range   Color, UA yellow yellow   Clarity, UA cloudy (A) clear   Glucose, UA negative negative mg/dL   Bilirubin, UA negative negative   Ketones, POC UA trace (5) (A) negative mg/dL   Spec Grav, UA >=1.030 (A) 1.010 - 1.025   Blood, UA negative negative   pH, UA 5.5 5.0 - 8.0   Protein Ur, POC trace (A) negative mg/dL   Urobilinogen, UA 0.2 0.2 or 1.0 E.U./dL   Nitrite, UA Negative Negative   Leukocytes, UA Small (1+) (A) Negative    Assessment and Plan :   PDMP not reviewed this encounter.  1. Acute bilateral low back pain without sciatica   2. Dehydration    Low concern for an urinary tract infection, recurrent renal colic.  Urine culture is pending.  Emphasized the need to hydrate much better with plain water.  Recommended avoiding urinary irritants and limiting her  coffee.  Recommended conservative management with Tylenol for her back pain which I suspect is secondary to her lack of hydration.  Deferred imaging given lack of physical exam findings warranting this.  Counseled patient on potential for adverse effects with medications prescribed/recommended today, ER and return-to-clinic precautions discussed, patient verbalized understanding.    Jaynee Eagles, PA-C 09/05/21 1040

## 2021-09-05 NOTE — Discharge Instructions (Addendum)
Make sure you hydrate very well with plain water and a quantity of 64 ounces of water a day.  Please limit drinks that are considered urinary irritants such as soda, sweet tea, coffee, energy drinks, alcohol.  Limit your coffee to 8-10 ounces daily. These can worsen your urinary and genital symptoms but also be the source of them.  I will let you know about your urine culture results through MyChart to see if we need to prescribe or change your antibiotics based off of those results. ?

## 2021-09-07 ENCOUNTER — Ambulatory Visit (INDEPENDENT_AMBULATORY_CARE_PROVIDER_SITE_OTHER): Payer: BC Managed Care – PPO | Admitting: Gastroenterology

## 2021-09-07 ENCOUNTER — Encounter: Payer: Self-pay | Admitting: Gastroenterology

## 2021-09-07 VITALS — BP 144/92 | HR 67 | Ht 67.0 in | Wt 195.0 lb

## 2021-09-07 DIAGNOSIS — K625 Hemorrhage of anus and rectum: Secondary | ICD-10-CM | POA: Insufficient documentation

## 2021-09-07 DIAGNOSIS — R110 Nausea: Secondary | ICD-10-CM | POA: Diagnosis not present

## 2021-09-07 LAB — URINE CULTURE

## 2021-09-07 MED ORDER — ONDANSETRON 4 MG PO TBDP: 4.0000 mg | ORAL_TABLET | Freq: Four times a day (QID) | ORAL | 2 refills | Status: DC | PRN

## 2021-09-07 MED ORDER — PLENVU 140 G PO SOLR: ORAL | 0 refills | Status: DC

## 2021-09-07 NOTE — Patient Instructions (Signed)
If you are age 39 or older, your body mass index should be between 23-30. Your Body mass index is 30.54 kg/m?[name] Kitchen If this is out of the aforementioned range listed, please consider follow up with your Primary Care Provider. ? ?If you are age 79 or younger, your body mass index should be between 19-25. Your Body mass index is 30.54 kg/m?[name] Kitchen If this is out of the aformentioned range listed, please consider follow up with your Primary Care Provider.  ? ?We have sent the following medications to your pharmacy for you to pick up at your convenience: ?Zofran 4 mg. ? ? ?The [facility] providers would like to encourage you to use [facility] to communicate with providers for non-urgent requests or questions.  Due to long hold times on the telephone, sending your provider a message by [facility] may be a faster and more efficient way to get a response.  Please allow 48 business hours for a response.  Please remember that this is for non-urgent requests.  ? ?It was a pleasure to see you today! ? ?Thank you for trusting me with your gastrointestinal care!   ? ?Alonza Bogus, PA-C ? ?

## 2021-09-07 NOTE — Progress Notes (Signed)
? ? ? ?09/07/2021 ?Foye Deer ?[id number] ?31-Jan-1981 ? ? ?HISTORY OF PRESENT ILLNESS: This is a 41 year old female who is a patient of Dr. Lynne Leader.  She has been seen here on a couple of occasions since 2018 for evaluation of nausea and vomiting.  Had an EGD in July 2018 at which time grade B esophagitis, small hiatal hernia, and gastritis.  Gastric biopsies showed reactive gastropathy, negative for H. pylori.  She has been on pantoprazole 40 mg twice daily.  She feels like her acid reflux is well controlled.  She says that her nausea has been 10 times worse recently.  He is using the Zofran at least a couple of times every day.  She says that she also wanted to be seen because she has been pooping blood.  It does not happen with every bowel movement, only sporadically, but sometimes a large amount in the toilet bowl.  She says her bowel habits are fairly normal without any significant constipation or diarrhea.  No real abdominal pain, except she reports an occasional what she thinks is a muscle spasm on the right side of her abdomen that just last for minutes. ? ? ?Past Medical History:  ?Diagnosis Date  ? Anxiety   ? Deviated septum   ? GERD (gastroesophageal reflux disease)   ? History of bilateral salpingectomy   ? Hypertension   ? Renal stones   ? Sleep apnea   ? Doesn't use everyday  ? ?Past Surgical History:  ?Procedure Laterality Date  ? BILATERAL SALPINGECTOMY  07.2016  ? CYSTOSCOPY WITH RETROGRADE PYELOGRAM, URETEROSCOPY AND STENT PLACEMENT Right 07/29/2015  ? Procedure: CYSTOSCOPY WITH RIGHT RETROGRADE PYELOGRAM, RIGHT URETEROSCOPY AND STENT PLACEMENT;  Surgeon: Irine Seal, MD;  Location: [facility];  Service: Urology;  Laterality: Right;  ? ECTOPIC PREGNANCY SURGERY  10/2013  ? HOLMIUM LASER APPLICATION Right 4/0/9811  ? Procedure: HOLMIUM LASER APPLICATION;  Surgeon: Irine Seal, MD;  Location: [facility];  Service: Urology;  Laterality: Right;  ? NASAL SEPTUM SURGERY    ? doen along with tonsillectomy   ?  TONSILLECTOMY    ? TYMPANOSTOMY    ? x 3  ? ? reports that she quit smoking about 17 months ago. Her smoking use included cigarettes. She started smoking about 23 years ago. She has a 3.50 pack-year smoking history. She has never used smokeless tobacco. She reports that she does not drink alcohol and does not use drugs. ?family history includes Diabetes in her brother and another family member; Hyperlipidemia in her father; Hypertension in her father and mother. ?Allergies  ?Allergen Reactions  ? Hydrocodeine [Dihydrocodeine] Shortness Of Breath and Nausea And Vomiting  ? Hydrocodone-Acetaminophen Shortness Of Breath and Nausea Only  ? Coreg [Carvedilol]   ?  Headache ?  ? Labetalol Other (See Comments)  ?  Head itches  ? ? ?  ?Outpatient Encounter Medications as of 09/07/2021  ?Medication Sig  ? acetaminophen (TYLENOL) 325 MG tablet Take 2 tablets (650 mg total) by mouth every 6 (six) hours as needed for moderate pain.  ? ALPRAZolam (XANAX) 1 MG tablet Take 0.5-1 tablets by mouth 3 (three) times daily as needed for anxiety.  ? amLODipine (NORVASC) 10 MG tablet Take 1 tablet (10 mg total) by mouth daily.  ? aspirin EC 81 MG EC tablet Take 1 tablet (81 mg total) by mouth daily.  ? atorvastatin (LIPITOR) 40 MG tablet Take 1 tablet (40 mg total) by mouth daily at 6 PM.  ? cyclobenzaprine (FLEXERIL) 10 MG  tablet Take 1 tablet (10 mg total) by mouth 2 (two) times daily as needed for muscle spasms.  ? ezetimibe (ZETIA) 10 MG tablet Take 1 tablet (10 mg total) by mouth daily.  ? levothyroxine (SYNTHROID) 75 MCG tablet Take 75 mcg by mouth daily before breakfast.  ? losartan-hydrochlorothiazide (HYZAAR) 100-12.5 MG tablet Take 1 tablet by mouth daily.  ? pantoprazole (PROTONIX) 40 MG tablet TAKE 1 TABLET BY MOUTH TWICE DAILY BEFORE BREAKFAST AND DINNER  ? PEG-KCl-NaCl-NaSulf-Na Asc-C (PLENVU) 140 g SOLR Use as directed. Manufacturer's coupon Universal coupon code:BIN: P2366821; GROUP: [id number]; PCN: [id number]; ID: [id number];  PAY NO MORE $50; NO prior authorization  ? propranolol ER (INDERAL LA) 120 MG 24 hr capsule Take 1 capsule (120 mg total) by mouth daily.  ? sertraline (ZOLOFT) 100 MG tablet Take 150 mg by mouth daily.  ? [DISCONTINUED] ondansetron (ZOFRAN ODT) 4 MG disintegrating tablet Take 1 tablet (4 mg total) by mouth every 6 (six) hours as needed for nausea or vomiting.  ? furosemide (LASIX) 20 MG tablet Take 1 tablet (20 mg total) by mouth daily as needed for edema.  ? ondansetron (ZOFRAN ODT) 4 MG disintegrating tablet Take 1 tablet (4 mg total) by mouth every 6 (six) hours as needed for nausea or vomiting.  ? [DISCONTINUED] lidocaine (XYLOCAINE) 2 % solution Apply pea size amount for local pain relief orally twice daily as needed.  ? [DISCONTINUED] loperamide (IMODIUM) 2 MG capsule Take 1 capsule (2 mg total) by mouth 2 (two) times daily as needed for diarrhea or loose stools.  ? [DISCONTINUED] ondansetron (ZOFRAN-ODT) 4 MG disintegrating tablet Take 1 tablet (4 mg total) by mouth every 8 (eight) hours as needed for nausea or vomiting.  ? [DISCONTINUED] terbinafine (LAMISIL) 250 MG tablet Take 250 mg by mouth daily.  ? ?No facility-administered encounter medications on file as of 09/07/2021.  ? ? ? ?REVIEW OF SYSTEMS  : All other systems reviewed and negative except where noted in the History of Present Illness. ? ? ?PHYSICAL EXAM: ?BP (!) 144/92   Pulse 67   Ht '5\' 7"'$  (1.702 m)   Wt 195 lb (88.5 kg)   LMP 08/14/2021 (Approximate)   BMI 30.54 kg/m?  ?General: Well developed white female in no acute distress ?Head: Normocephalic and atraumatic ?Eyes:  Sclerae anicteric, conjunctiva pink. ?Ears: Normal auditory acuity ?Lungs: Clear throughout to auscultation; no W/R/R. ?Heart: Regular rate and rhythm; no M/R/G. ?Abdomen: Soft, non-distended.  BS present.  Non-tender. ?Rectal:  Will be done at the time of colonoscopy. ?Musculoskeletal: Symmetrical with no gross deformities  ?Skin: No lesions on visible  extremities ?Extremities: No edema  ?Neurological: Alert oriented x 4, grossly non-focal ?Psychological:  Alert and cooperative. Normal mood and affect ? ?ASSESSMENT AND PLAN: ?*Rectal bleeding: Large amounts in the toilet bowl at times.  No altered bowel habits with extreme constipation or diarrhea.  Probably hemorrhoidal source, but deserves colonoscopy.  We will schedule Dr. Fuller Plan.  The risks, benefits, and alternatives to colonoscopy were discussed with the patient and she consents to proceed.  ?*Nausea: This is an ongoing issue, but has worsened again recently.  Takes Zofran, which does help to a degree.  Will refill her prescription at her request.  Sent to pharmacy.  If colonoscopy only shows hemorrhoids and no other pathology then may need to investigate nausea further from a different standpoint.  Is on pantoprazole 40 mg daily for her reflux and feels like that is well controlled. ? ? ?CC:  Laberge, Meagan,  PA-C ? ?  ?

## 2021-09-15 ENCOUNTER — Encounter: Payer: Self-pay | Admitting: Gastroenterology

## 2021-09-21 ENCOUNTER — Ambulatory Visit (AMBULATORY_SURGERY_CENTER): Payer: BC Managed Care – PPO | Admitting: Gastroenterology

## 2021-09-21 ENCOUNTER — Other Ambulatory Visit: Payer: Self-pay

## 2021-09-21 ENCOUNTER — Encounter: Payer: Self-pay | Admitting: Gastroenterology

## 2021-09-21 VITALS — BP 134/89 | HR 67 | Temp 98.9°F | Resp 10 | Ht 67.0 in | Wt 195.0 lb

## 2021-09-21 DIAGNOSIS — K640 First degree hemorrhoids: Secondary | ICD-10-CM | POA: Diagnosis not present

## 2021-09-21 DIAGNOSIS — C20 Malignant neoplasm of rectum: Secondary | ICD-10-CM | POA: Diagnosis not present

## 2021-09-21 DIAGNOSIS — K921 Melena: Secondary | ICD-10-CM | POA: Diagnosis not present

## 2021-09-21 DIAGNOSIS — D3A026 Benign carcinoid tumor of the rectum: Secondary | ICD-10-CM | POA: Diagnosis not present

## 2021-09-21 DIAGNOSIS — D128 Benign neoplasm of rectum: Secondary | ICD-10-CM

## 2021-09-21 MED ORDER — SODIUM CHLORIDE 0.9 % IV SOLN: 500.0000 mL | Freq: Once | INTRAVENOUS | Status: DC

## 2021-09-21 NOTE — Op Note (Signed)
[place] ?Patient Name: Maria Lang ?Procedure Date: 09/21/2021 9:01 AM ?MRN: [id number] ?Endoscopist: Ladene Artist , MD ?Age: 41 ?Referring MD:  ?Date of Birth: 1980/08/13 ?Gender: Female ?Account #: [id number] ?Procedure:                Colonoscopy ?Indications:              Hematochezia ?Medicines:                Monitored Anesthesia Care ?Procedure:                Pre-Anesthesia Assessment: ?                          - Prior to the procedure, a History and Physical  ?                          was performed, and patient medications and  ?                          allergies were reviewed. The patient's tolerance of  ?                          previous anesthesia was also reviewed. The risks  ?                          and benefits of the procedure and the sedation  ?                          options and risks were discussed with the patient.  ?                          All questions were answered, and informed consent  ?                          was obtained. Prior Anticoagulants: The patient has  ?                          taken no previous anticoagulant or antiplatelet  ?                          agents. ASA Grade Assessment: III - A patient with  ?                          severe systemic disease. After reviewing the risks  ?                          and benefits, the patient was deemed in  ?                          satisfactory condition to undergo the procedure. ?                          After obtaining informed consent, the colonoscope  ?  was passed under direct vision. Throughout the  ?                          procedure, the patient's blood pressure, pulse, and  ?                          oxygen saturations were monitored continuously. The  ?                          Olympus CF-[id number] (#[id number]) Colonoscope was  ?                          introduced through the anus and advanced to the the  ?                          cecum, identified by appendiceal orifice and   ?                          ileocecal valve. The ileocecal valve, appendiceal  ?                          orifice, and rectum were photographed. The quality  ?                          of the bowel preparation was adequate after  ?                          extensive lavage and suction. The colonoscopy was  ?                          performed without difficulty. The patient tolerated  ?                          the procedure well. ?Scope In: 9:14:45 AM ?Scope Out: 9:46:11 AM ?Scope Withdrawal Time: 0 hours 24 minutes 27 seconds  ?Total Procedure Duration: 0 hours 31 minutes 26 seconds  ?Findings:                 The perianal and digital rectal examinations were  ?                          normal. ?                          One 7 mm submucosal nodule was found in the mid  ?                          rectum. The polyp was removed with a hot snare.  ?                          Resection and retrieval were complete. ?                          Internal hemorrhoids were found during  ?  retroflexion. The hemorrhoids were small and Grade  ?                          I (internal hemorrhoids that do not prolapse). ?                          The exam was otherwise without abnormality on  ?                          direct and retroflexion views. ?Complications:            No immediate complications. Estimated blood loss:  ?                          None. ?Estimated Blood Loss:     Estimated blood loss: none. ?Impression:               - Submucosal nodule in the mid rectum. ?                          - Internal hemorrhoids. ?                          - The examination was otherwise normal on direct  ?                          and retroflexion views. ?Recommendation:           - Repeat colonoscopy after studies are complete for  ?                          surveillance based on pathology results with a more  ?                          extensive bowel prep. ?                          - Patient has a contact  number available for  ?                          emergencies. The signs and symptoms of potential  ?                          delayed complications were discussed with the  ?                          patient. Return to normal activities tomorrow.  ?                          Written discharge instructions were provided to the  ?                          patient. ?                          - Resume previous diet. ?                          -  Continue present medications. ?                          - Await pathology results. ?Ladene Artist, MD ?09/21/2021 9:51:23 AM ?This report has been signed electronically. ?

## 2021-09-21 NOTE — Progress Notes (Signed)
Called to room to assist during endoscopic procedure.  Patient ID and intended procedure confirmed with present staff. Received instructions for my participation in the procedure from the performing physician.  

## 2021-09-21 NOTE — Patient Instructions (Signed)
Handouts on polyps given. ? ?Await pathology results. ? ?YOU HAD AN ENDOSCOPIC PROCEDURE TODAY AT [facility]:   Refer to the procedure report that was given to you for any specific questions about what was found during the examination.  If the procedure report does not answer your questions, please call your gastroenterologist to clarify.  If you requested that your care partner not be given the details of your procedure findings, then the procedure report has been included in a sealed envelope for you to review at your convenience later. ? ?YOU SHOULD EXPECT: Some feelings of bloating in the abdomen. Passage of more gas than usual.  Walking can help get rid of the air that was put into your GI tract during the procedure and reduce the bloating. If you had a lower endoscopy (such as a colonoscopy or flexible sigmoidoscopy) you may notice spotting of blood in your stool or on the toilet paper. If you underwent a bowel prep for your procedure, you may not have a normal bowel movement for a few days. ? ?Please Note:  You might notice some irritation and congestion in your nose or some drainage.  This is from the oxygen used during your procedure.  There is no need for concern and it should clear up in a day or so. ? ?SYMPTOMS TO REPORT IMMEDIATELY: ? ?Following lower endoscopy (colonoscopy or flexible sigmoidoscopy): ? Excessive amounts of blood in the stool ? Significant tenderness or worsening of abdominal pains ? Swelling of the abdomen that is new, acute ? Fever of 100?F or higher ? ?For urgent or emergent issues, a gastroenterologist can be reached at any hour by calling [phone]. ?Do not use MyChart messaging for urgent concerns.  ? ? ?DIET:  We do recommend a small meal at first, but then you may proceed to your regular diet.  Drink plenty of fluids but you should avoid alcoholic beverages for 24 hours. ? ?ACTIVITY:  You should plan to take it easy for the rest of today and you should NOT  DRIVE or use heavy machinery until tomorrow (because of the sedation medicines used during the test).   ? ?FOLLOW UP: ?Our staff will call the number listed on your records 48-72 hours following your procedure to check on you and address any questions or concerns that you may have regarding the information given to you following your procedure. If we do not reach you, we will leave a message.  We will attempt to reach you two times.  During this call, we will ask if you have developed any symptoms of COVID 19. If you develop any symptoms (ie: fever, flu-like symptoms, shortness of breath, cough etc.) before then, please call [phone].  If you test positive for Covid 19 in the 2 weeks post procedure, please call and report this information to [place].   ? ?If any biopsies were taken you will be contacted by phone or by letter within the next 1-3 weeks.  Please call us at [phone] if you have not heard about the biopsies in 3 weeks.  ? ? ?SIGNATURES/CONFIDENTIALITY: ?You and/or your care partner have signed paperwork which will be entered into your electronic medical record.  These signatures attest to the fact that that the information above on your After Visit Summary has been reviewed and is understood.  Full responsibility of the confidentiality of this discharge information lies with you and/or your care-partner.  ?

## 2021-09-21 NOTE — Progress Notes (Signed)
VS by CW. ?

## 2021-09-21 NOTE — Progress Notes (Signed)
See 09/07/2021 H&P, no changes. ?

## 2021-09-21 NOTE — Progress Notes (Signed)
PT taken to PACU. Monitors in place. VSS. Report given to RN. 

## 2021-09-23 ENCOUNTER — Telehealth: Payer: Self-pay | Admitting: *Deleted

## 2021-09-23 NOTE — Telephone Encounter (Signed)
No answer after second procedure call back. Unable to leave message. ?

## 2021-09-23 NOTE — Telephone Encounter (Signed)
?  Follow up Call- ? ? ?  09/21/2021  ?  8:24 AM  ?Call back number  ?Post procedure Call Back phone  # [phone]  ?Permission to leave phone message Yes  ?  ?LMOM ? ? ?

## 2021-09-26 ENCOUNTER — Encounter: Payer: Self-pay | Admitting: Gastroenterology

## 2021-09-27 ENCOUNTER — Encounter: Payer: Self-pay | Admitting: Gastroenterology

## 2021-09-28 ENCOUNTER — Telehealth: Payer: Self-pay | Admitting: Gastroenterology

## 2021-09-28 ENCOUNTER — Other Ambulatory Visit: Payer: Self-pay

## 2021-09-28 DIAGNOSIS — D128 Benign neoplasm of rectum: Secondary | ICD-10-CM

## 2021-09-28 NOTE — Telephone Encounter (Signed)
Patient called to move forward with scheduling EUS with Dr. Rush Landmark. Per patient, she can do May 25th. Please advise.  ?

## 2021-09-28 NOTE — Telephone Encounter (Signed)
The pt has been scheduled for 11/17/21 at 1045 am at [facility] with [facility] for lower EUS EMR.   ? ?All Information has been sent to the pt My Chart.  Verified pt reviews the messages.   ?

## 2021-10-03 ENCOUNTER — Encounter: Payer: Self-pay | Admitting: Gastroenterology

## 2021-10-10 ENCOUNTER — Emergency Department (HOSPITAL_COMMUNITY): Payer: BC Managed Care – PPO

## 2021-10-10 ENCOUNTER — Inpatient Hospital Stay (HOSPITAL_COMMUNITY)
Admission: EM | Admit: 2021-10-10 | Discharge: 2021-10-12 | DRG: 062 | Disposition: A | Discharge status: Home / Self Care | Payer: BC Managed Care – PPO | Attending: Neurology | Admitting: Neurology

## 2021-10-10 ENCOUNTER — Encounter (HOSPITAL_COMMUNITY): Payer: Self-pay | Admitting: Neurology

## 2021-10-10 DIAGNOSIS — G4733 Obstructive sleep apnea (adult) (pediatric): Secondary | ICD-10-CM | POA: Diagnosis present

## 2021-10-10 DIAGNOSIS — E119 Type 2 diabetes mellitus without complications: Secondary | ICD-10-CM | POA: Diagnosis present

## 2021-10-10 DIAGNOSIS — E039 Hypothyroidism, unspecified: Secondary | ICD-10-CM | POA: Diagnosis present

## 2021-10-10 DIAGNOSIS — N390 Urinary tract infection, site not specified: Secondary | ICD-10-CM | POA: Diagnosis present

## 2021-10-10 DIAGNOSIS — Z79899 Other long term (current) drug therapy: Secondary | ICD-10-CM | POA: Diagnosis not present

## 2021-10-10 DIAGNOSIS — R29703 NIHSS score 3: Secondary | ICD-10-CM | POA: Diagnosis present

## 2021-10-10 DIAGNOSIS — Z7982 Long term (current) use of aspirin: Secondary | ICD-10-CM

## 2021-10-10 DIAGNOSIS — G459 Transient cerebral ischemic attack, unspecified: Secondary | ICD-10-CM | POA: Diagnosis present

## 2021-10-10 DIAGNOSIS — G8194 Hemiplegia, unspecified affecting left nondominant side: Secondary | ICD-10-CM | POA: Diagnosis present

## 2021-10-10 DIAGNOSIS — I5032 Chronic diastolic (congestive) heart failure: Secondary | ICD-10-CM | POA: Diagnosis present

## 2021-10-10 DIAGNOSIS — Z20822 Contact with and (suspected) exposure to covid-19: Secondary | ICD-10-CM | POA: Diagnosis present

## 2021-10-10 DIAGNOSIS — Z83438 Family history of other disorder of lipoprotein metabolism and other lipidemia: Secondary | ICD-10-CM | POA: Diagnosis not present

## 2021-10-10 DIAGNOSIS — K219 Gastro-esophageal reflux disease without esophagitis: Secondary | ICD-10-CM | POA: Diagnosis present

## 2021-10-10 DIAGNOSIS — Z6829 Body mass index (BMI) 29.0-29.9, adult: Secondary | ICD-10-CM | POA: Diagnosis not present

## 2021-10-10 DIAGNOSIS — R531 Weakness: Secondary | ICD-10-CM | POA: Diagnosis present

## 2021-10-10 DIAGNOSIS — Z833 Family history of diabetes mellitus: Secondary | ICD-10-CM

## 2021-10-10 DIAGNOSIS — I110 Hypertensive heart disease with heart failure: Secondary | ICD-10-CM | POA: Diagnosis present

## 2021-10-10 DIAGNOSIS — I517 Cardiomegaly: Secondary | ICD-10-CM | POA: Diagnosis not present

## 2021-10-10 DIAGNOSIS — I5030 Unspecified diastolic (congestive) heart failure: Secondary | ICD-10-CM | POA: Diagnosis not present

## 2021-10-10 DIAGNOSIS — Z87891 Personal history of nicotine dependence: Secondary | ICD-10-CM | POA: Diagnosis not present

## 2021-10-10 DIAGNOSIS — Z79890 Hormone replacement therapy (postmenopausal): Secondary | ICD-10-CM

## 2021-10-10 DIAGNOSIS — I6389 Other cerebral infarction: Secondary | ICD-10-CM | POA: Diagnosis not present

## 2021-10-10 DIAGNOSIS — I639 Cerebral infarction, unspecified: Secondary | ICD-10-CM | POA: Diagnosis not present

## 2021-10-10 DIAGNOSIS — E669 Obesity, unspecified: Secondary | ICD-10-CM | POA: Diagnosis present

## 2021-10-10 DIAGNOSIS — Z8249 Family history of ischemic heart disease and other diseases of the circulatory system: Secondary | ICD-10-CM | POA: Diagnosis not present

## 2021-10-10 DIAGNOSIS — E785 Hyperlipidemia, unspecified: Secondary | ICD-10-CM | POA: Diagnosis present

## 2021-10-10 DIAGNOSIS — Z8673 Personal history of transient ischemic attack (TIA), and cerebral infarction without residual deficits: Secondary | ICD-10-CM | POA: Diagnosis not present

## 2021-10-10 LAB — RAPID URINE DRUG SCREEN, HOSP PERFORMED
Amphetamines: NOT DETECTED
Barbiturates: NOT DETECTED
Benzodiazepines: POSITIVE — AB
Cocaine: NOT DETECTED
Opiates: NOT DETECTED
Tetrahydrocannabinol: NOT DETECTED

## 2021-10-10 LAB — I-STAT CHEM 8, ED
BUN: 19 mg/dL (ref 6–20)
Calcium, Ion: 1.16 mmol/L (ref 1.15–1.40)
Chloride: 104 mmol/L (ref 98–111)
Creatinine, Ser: 1.2 mg/dL — ABNORMAL HIGH (ref 0.44–1.00)
Glucose, Bld: 154 mg/dL — ABNORMAL HIGH (ref 70–99)
HCT: 37 % (ref 36.0–46.0)
Hemoglobin: 12.6 g/dL (ref 12.0–15.0)
Potassium: 3.5 mmol/L (ref 3.5–5.1)
Sodium: 139 mmol/L (ref 135–145)
TCO2: 24 mmol/L (ref 22–32)

## 2021-10-10 LAB — CBC
HCT: 35.7 % — ABNORMAL LOW (ref 36.0–46.0)
Hemoglobin: 11.1 g/dL — ABNORMAL LOW (ref 12.0–15.0)
MCH: 26.6 pg (ref 26.0–34.0)
MCHC: 31.1 g/dL (ref 30.0–36.0)
MCV: 85.6 fL (ref 80.0–100.0)
Platelets: 387 10*3/uL (ref 150–400)
RBC: 4.17 MIL/uL (ref 3.87–5.11)
RDW: 14.6 % (ref 11.5–15.5)
WBC: 7.9 10*3/uL (ref 4.0–10.5)
nRBC: 0 % (ref 0.0–0.2)

## 2021-10-10 LAB — URINALYSIS, ROUTINE W REFLEX MICROSCOPIC
Bilirubin Urine: NEGATIVE
Glucose, UA: NEGATIVE mg/dL
Ketones, ur: NEGATIVE mg/dL
Nitrite: NEGATIVE
Protein, ur: 30 mg/dL — AB
Specific Gravity, Urine: 1.044 — ABNORMAL HIGH (ref 1.005–1.030)
Squamous Epithelial / HPF: >50 — ABNORMAL HIGH (ref 0–5)
pH: 6 (ref 5.0–8.0)

## 2021-10-10 LAB — COMPREHENSIVE METABOLIC PANEL
ALT: 21 U/L (ref 0–44)
AST: 17 U/L (ref 15–41)
Albumin: 4.7 g/dL (ref 3.5–5.0)
Alkaline Phosphatase: 73 U/L (ref 38–126)
Anion gap: 13 (ref 5–15)
BUN: 21 mg/dL — ABNORMAL HIGH (ref 6–20)
CO2: 23 mmol/L (ref 22–32)
Calcium: 9.9 mg/dL (ref 8.9–10.3)
Chloride: 103 mmol/L (ref 98–111)
Creatinine, Ser: 1.09 mg/dL — ABNORMAL HIGH (ref 0.44–1.00)
GFR, Estimated: >60 mL/min (ref >60)
Glucose, Bld: 152 mg/dL — ABNORMAL HIGH (ref 70–99)
Potassium: 3.4 mmol/L — ABNORMAL LOW (ref 3.5–5.1)
Sodium: 139 mmol/L (ref 135–145)
Total Bilirubin: 0.6 mg/dL (ref 0.3–1.2)
Total Protein: 8.3 g/dL — ABNORMAL HIGH (ref 6.5–8.1)

## 2021-10-10 LAB — RESP PANEL BY RT-PCR (FLU A&B, COVID) ARPGX2
Influenza A by PCR: NEGATIVE
Influenza B by PCR: NEGATIVE
SARS Coronavirus 2 by RT PCR: NEGATIVE

## 2021-10-10 LAB — DIFFERENTIAL
Abs Immature Granulocytes: 0.03 10*3/uL (ref 0.00–0.07)
Basophils Absolute: 0 10*3/uL (ref 0.0–0.1)
Basophils Relative: 0 %
Eosinophils Absolute: 0 10*3/uL (ref 0.0–0.5)
Eosinophils Relative: 0 %
Immature Granulocytes: 0 %
Lymphocytes Relative: 34 %
Lymphs Abs: 2.7 10*3/uL (ref 0.7–4.0)
Monocytes Absolute: 0.5 10*3/uL (ref 0.1–1.0)
Monocytes Relative: 6 %
Neutro Abs: 4.7 10*3/uL (ref 1.7–7.7)
Neutrophils Relative %: 60 %

## 2021-10-10 LAB — HEMOGLOBIN A1C
Hgb A1c MFr Bld: 5.3 % (ref 4.8–5.6)
Mean Plasma Glucose: 105.41 mg/dL

## 2021-10-10 LAB — HIV ANTIBODY (ROUTINE TESTING W REFLEX): HIV Screen 4th Generation wRfx: NONREACTIVE

## 2021-10-10 LAB — ETHANOL: Alcohol, Ethyl (B): <10 mg/dL (ref <10)

## 2021-10-10 LAB — MRSA NEXT GEN BY PCR, NASAL: MRSA by PCR Next Gen: NOT DETECTED

## 2021-10-10 LAB — PROTIME-INR
INR: 1 (ref 0.8–1.2)
Prothrombin Time: 13.3 seconds (ref 11.4–15.2)

## 2021-10-10 LAB — CBG MONITORING, ED: Glucose-Capillary: 159 mg/dL — ABNORMAL HIGH (ref 70–99)

## 2021-10-10 LAB — APTT: aPTT: 27 seconds (ref 24–36)

## 2021-10-10 IMAGING — CT CT ANGIO HEAD-NECK (W OR W/O PERF)
2 of 7 series · 8 of 33 positions shown · IV contrast (Omnipaque or Isovue)
Comparison: Same-day noncontrast head CT, CTA head/neck [DATE]

CLINICAL DATA: Tingling on left side

EXAM:
CT ANGIOGRAPHY HEAD AND NECK
TECHNIQUE: Multidetector CT imaging of the head and neck was performed using
the standard protocol during bolus administration of intravenous
contrast. Multiplanar CT image reconstructions and MIPs were
obtained to evaluate the vascular anatomy. Carotid stenosis
measurements (when applicable) are obtained utilizing NASCET
criteria, using the distal internal carotid diameter as the
denominator.

[Series 4: cta head & neck · axial · 0.44mm/px · z∈[+15,+121]mm · 2 of 160 slices shown]
[im 54/160  soft-tissue]
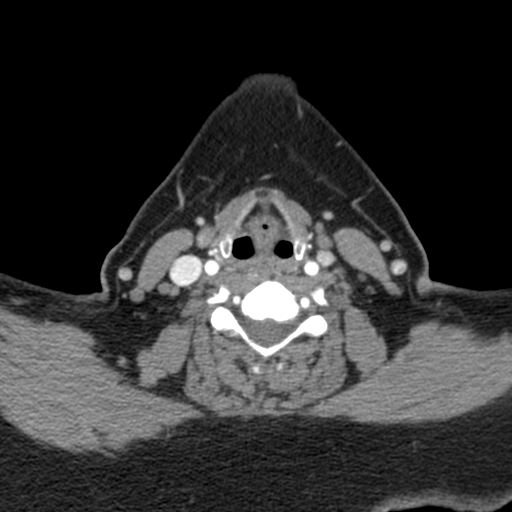
[im 107/160  soft-tissue]
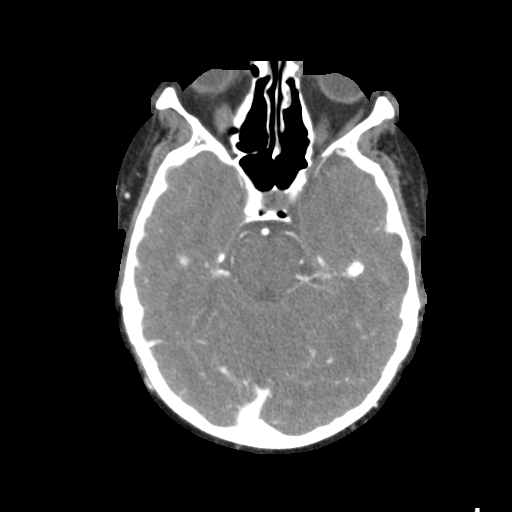

[Series 6: ax thins · axial · 0.39mm/px · z∈[-46,+182]mm · 6 of 319 slices shown]
[im 46/319  soft-tissue]
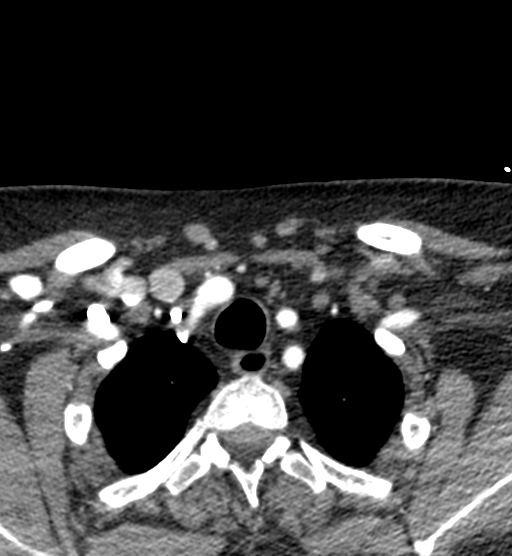
[im 91/319  bone]
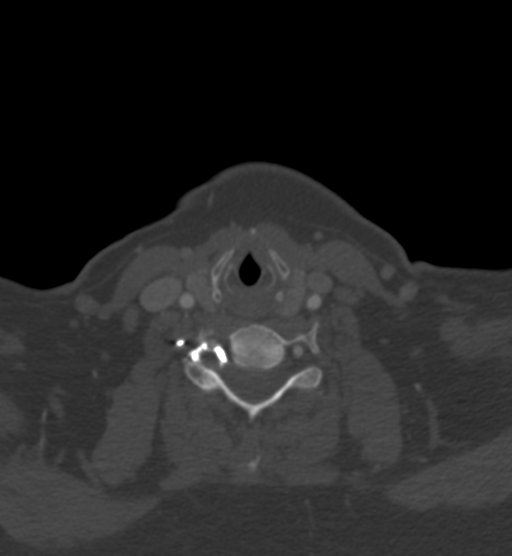
[im 137/319  soft-tissue]
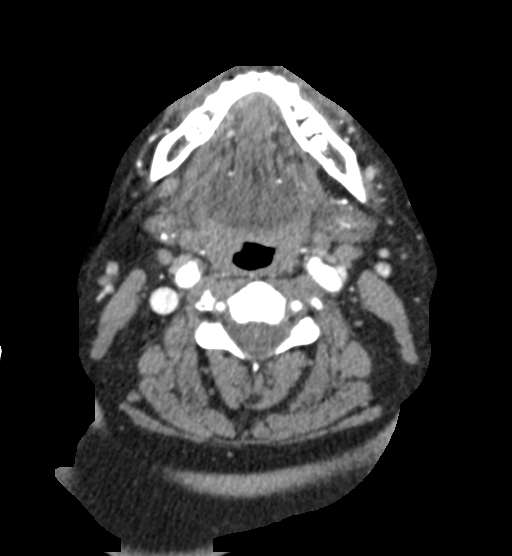
[im 182/319  bone]
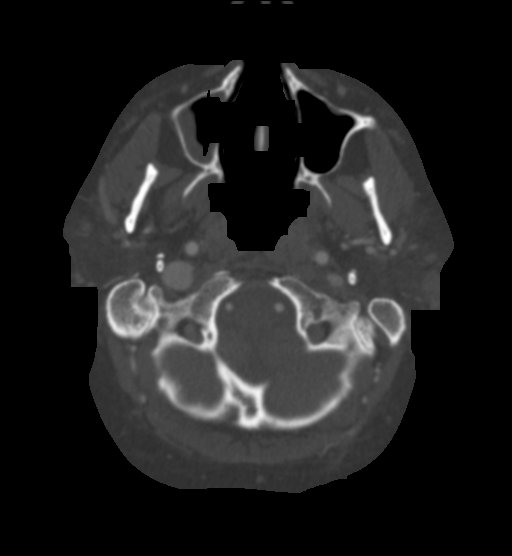
[im 228/319  soft-tissue]
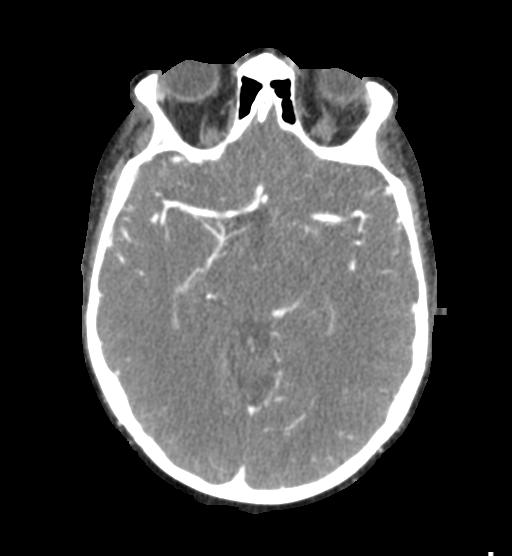
[im 273/319  bone]
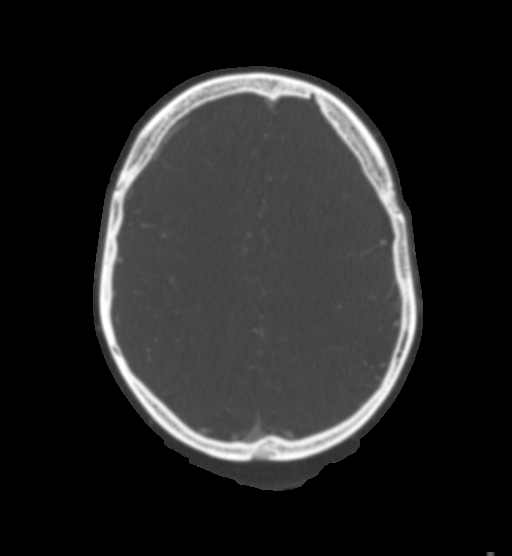

[8 of 33 positions shown; findings below may reference images not displayed]

RADIATION DOSE REDUCTION: This exam was performed according to the
departmental dose-optimization program which includes automated
exposure control, adjustment of the mA and/or kV according to
patient size and/or use of iterative reconstruction technique.

CONTRAST:  75mL OMNIPAQUE IOHEXOL 350 MG/ML SOLN
FINDINGS: CTA NECK FINDINGS

Aortic arch: Aortic arch is normal. The origins of the major branch
vessels are patent. The subclavian arteries are patent to the level
imaged.

Right carotid system: The right common, internal, and external
carotid arteries are patent, without hemodynamically significant
stenosis or occlusion. There is no dissection or aneurysm.

Left carotid system: Left common, internal, and external carotid
arteries are patent, without hemodynamically significant stenosis or
occlusion. There is no dissection or aneurysm.

Vertebral arteries: The vertebral arteries are patent, without
hemodynamically significant stenosis or occlusion. There is no
dissection or aneurysm.

Skeleton: There is no acute osseous abnormality or suspicious
osseous lesion. There is no visible canal hematoma.

Other neck: Soft tissues are unremarkable.

Upper chest: Imaged lung apices are clear.

Review of the MIP images confirms the above findings

CTA HEAD FINDINGS

Anterior circulation: The intracranial ICAs are patent with minimal
calcified plaque but no hemodynamically significant stenosis.

The bilateral MCAs are patent.

The bilateral ACAs are patent.

There is no aneurysm or AVM.

Posterior circulation: The bilateral V4 segments are patent. The
basilar artery is patent.

There is focal moderate to severe stenosis of the distal right P2
segment (7-110, 11-17), new since [CV]. The PCAs are otherwise
patent. The left posterior communicating artery is identified. The
right posterior communicating artery is not definitely seen.

There is no aneurysm or AVM.

Venous sinuses: Patent.

Anatomic variants: None.

Review of the MIP images confirms the above findings
IMPRESSION: 1. No emergent large vessel occlusion.
2. Moderate focal stenosis of the right P2 segment, new since [CV].
Otherwise, patent vasculature of the head and neck.

These findings were discussed with Dr. CHARIE MAE at [DATE] p.m.

## 2021-10-10 IMAGING — CT CT HEAD CODE STROKE
4 of 5 series · 16 of 47 positions shown, 18 images · non-contrast
Comparison: Brain MRI [DATE]

CLINICAL DATA: Code stroke.  Tingling on left side



[Series 2: head w o · axial · 0.47mm/px · z∈[+89,+214]mm · 8 of 33 slices shown, 10 images (1 of 2)]
[im 4/33  brain]
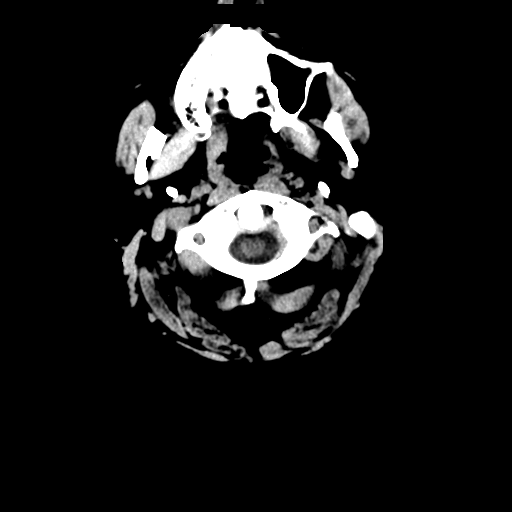
[im 4/33  bone]
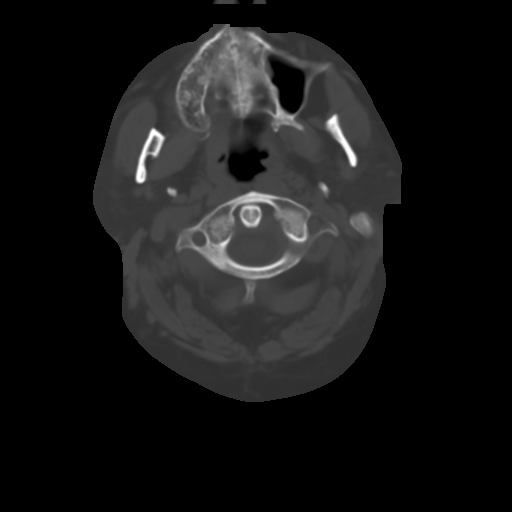
[im 8/33  brain]
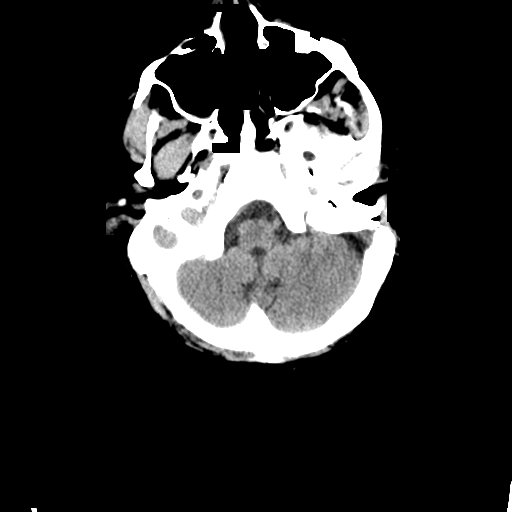
[im 11/33  brain]
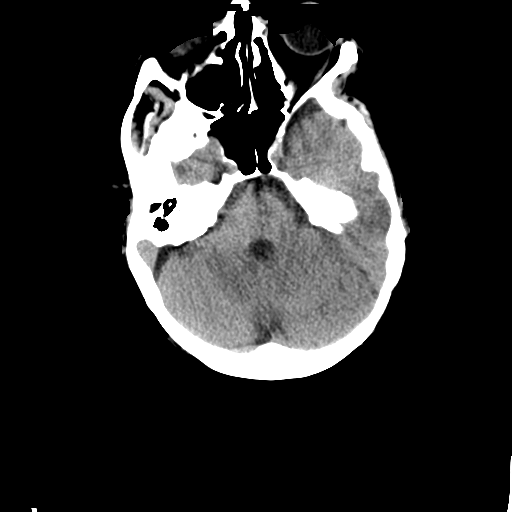
[im 15/33  brain]
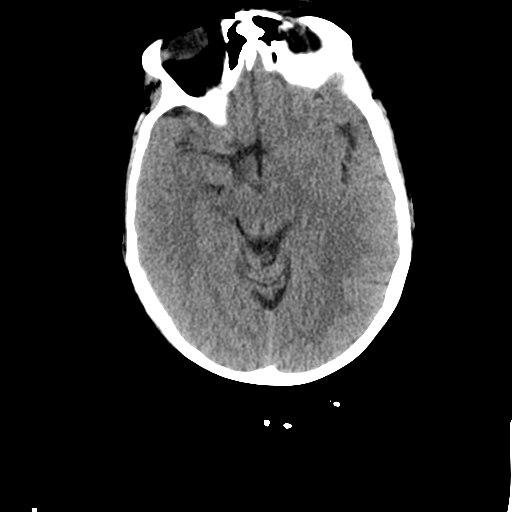
[im 18/33  brain]
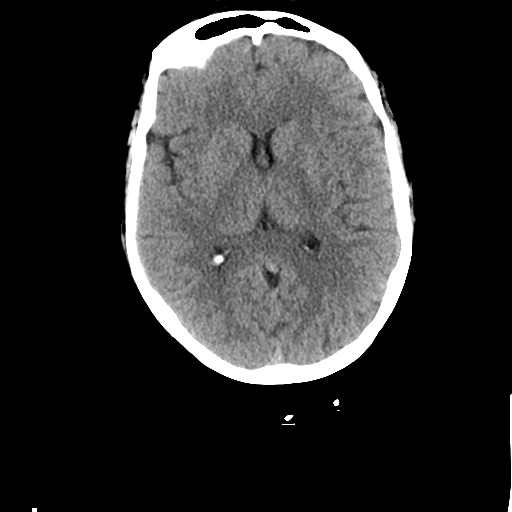
[im 18/33  bone]
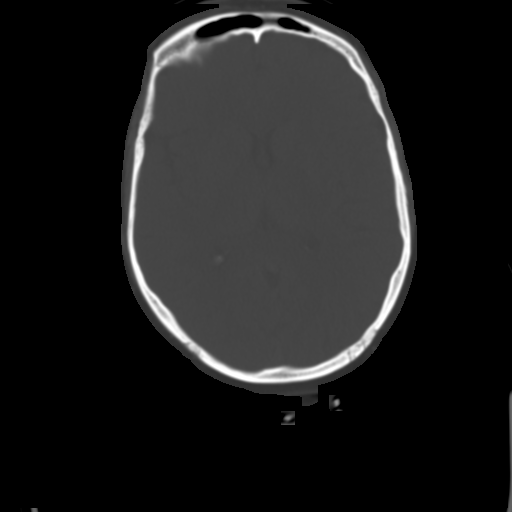
[im 22/33  brain]
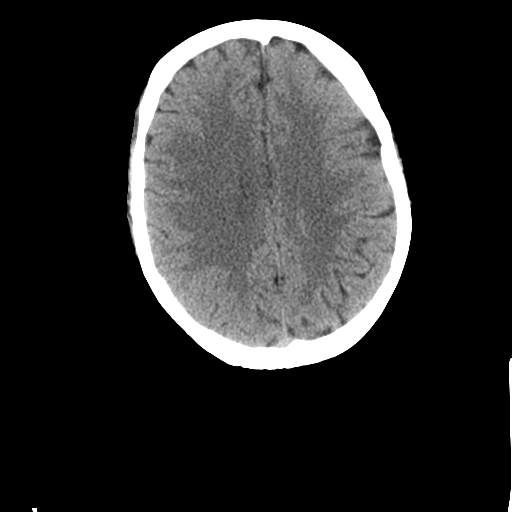
[im 25/33  brain]
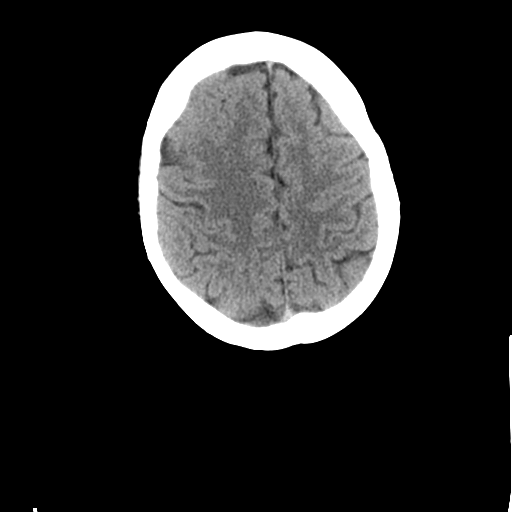
[im 29/33  brain]
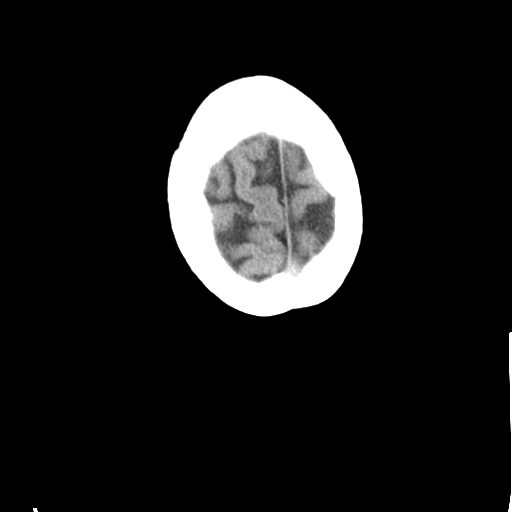

[Series 4: coronal soft · coronal · 0.33mm/px · 3 of 67 slices shown]
[im 23/67  brain]
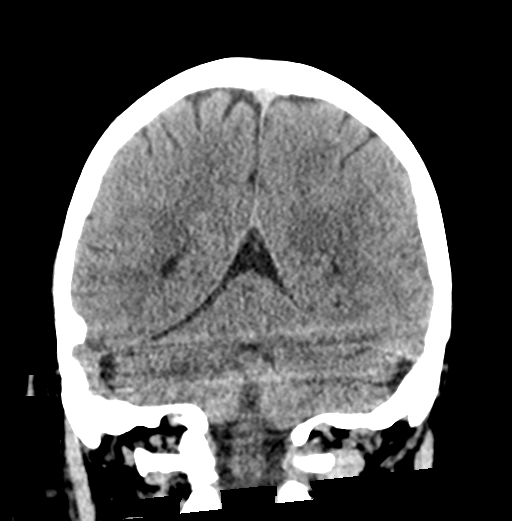
[im 30/67  brain]
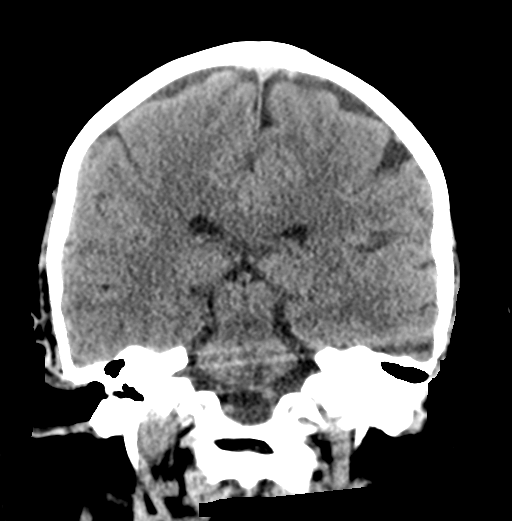
[im 37/67  brain]
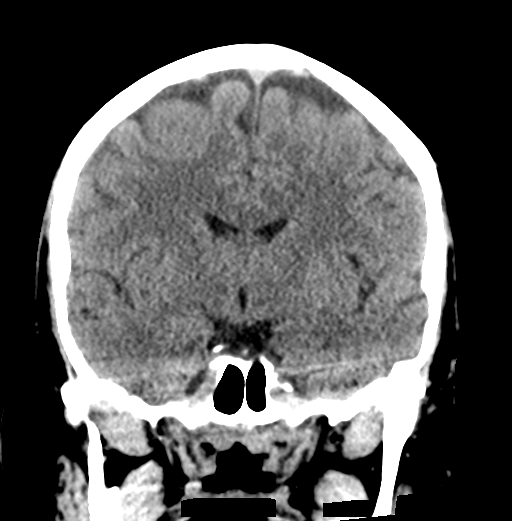

[Series 5: sagittal soft · sagittal · 0.34mm/px · 3 of 62 slices shown]
[im 21/62  brain]
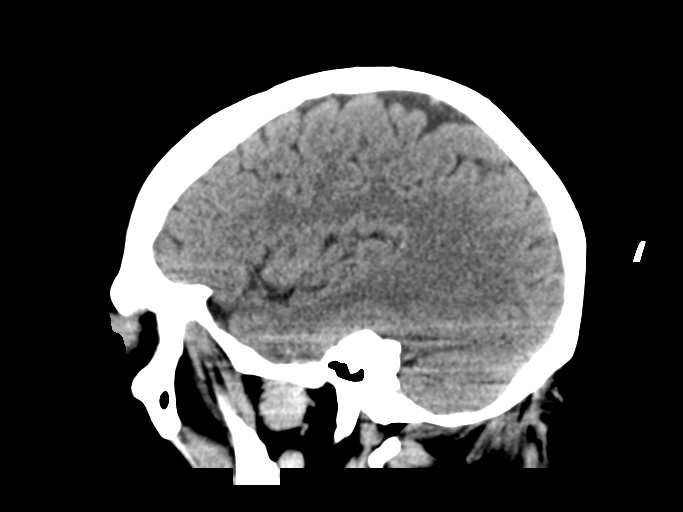
[im 31/62  brain]
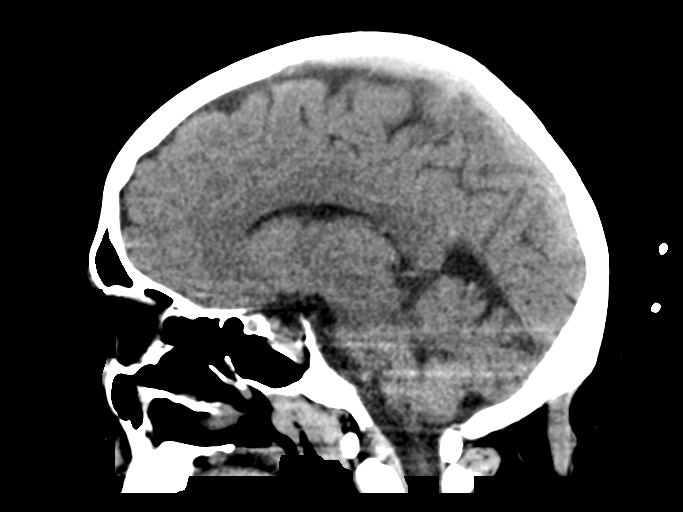
[im 41/62  brain]
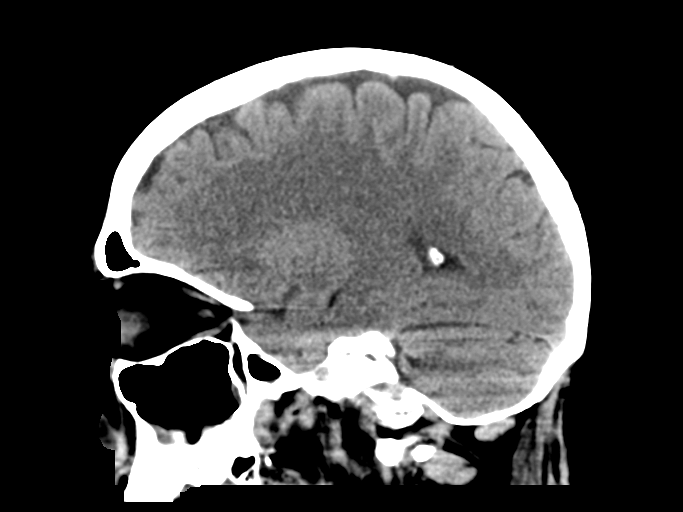

[Series 6: head w o · axial · 0.47mm/px · z∈[+89,+109]mm · 2 of 33 slices shown (2 of 2)]
[im 4/33  brain]
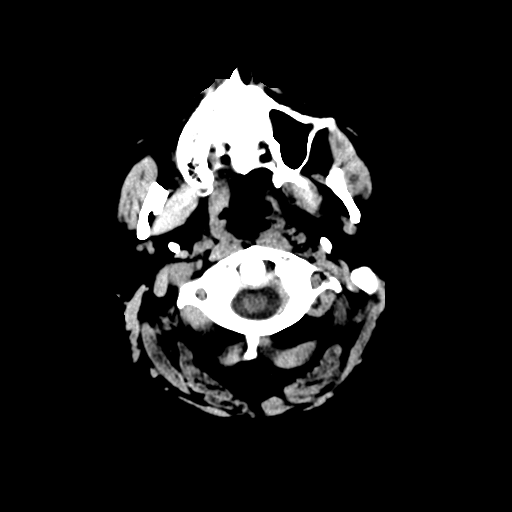
[im 8/33  brain]
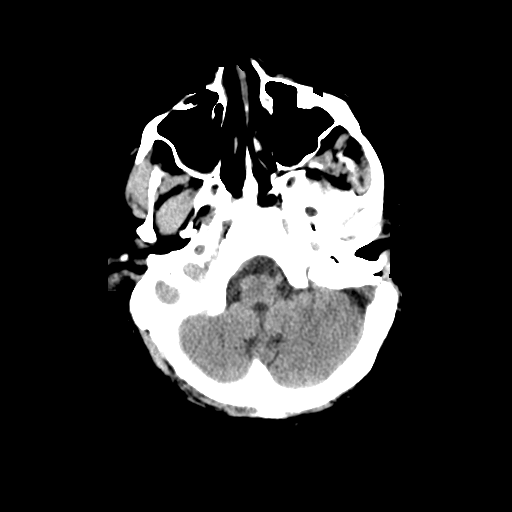

[16 of 47 positions shown; findings below may reference images not displayed]

FINDINGS: Brain: There is no evidence of acute intracranial hemorrhage,
extra-axial fluid collection, or acute infarct.

Parenchymal volume is normal. The ventricles are normal in size.
Gray-white differentiation is preserved.

There is no mass lesion.  There is no mass effect or midline shift.

Vascular: No hyperdense vessel or unexpected calcification.

Skull: Normal. Negative for fracture or focal lesion.

Sinuses/Orbits: Postsurgical changes are noted in the paranasal
sinuses. There is mild mucosal thickening in the right maxillary
sinus. The globes and orbits are unremarkable.

Other: None.

ASPECTS ([place] Stroke Program Early CT Score)

- Ganglionic level infarction (caudate, lentiform nuclei, internal
capsule, insula, M1-M3 cortex): 7

- Supraganglionic infarction (M4-M6 cortex): 3

Total score (0-10 with 10 being normal): 10
IMPRESSION: 1. No acute intracranial pathology.
2. ASPECTS is 10

Results of the noncontrast head CT were called by telephone at the
time of interpretation on [DATE] at [DATE] to provider ASELA
ASELA , who verbally acknowledged these results.

## 2021-10-10 MED ORDER — SERTRALINE HCL 50 MG PO TABS
150.0000 mg | ORAL_TABLET | Freq: Every day | ORAL | Status: DC
  Administered 2021-10-11 – 2021-10-12 (×2): 150 mg via ORAL
  Filled 2021-10-10 (×2): qty 3

## 2021-10-10 MED ORDER — ACETAMINOPHEN 160 MG/5ML PO SOLN: 650.0000 mg | ORAL | Status: DC | PRN

## 2021-10-10 MED ORDER — SENNOSIDES-DOCUSATE SODIUM 8.6-50 MG PO TABS: 1.0000 | ORAL_TABLET | Freq: Every evening | ORAL | Status: DC | PRN

## 2021-10-10 MED ORDER — TENECTEPLASE FOR STROKE
PACK | INTRAVENOUS | Status: AC
  Administered 2021-10-10: 21 mg via INTRAVENOUS
  Filled 2021-10-10: qty 10

## 2021-10-10 MED ORDER — TENECTEPLASE FOR STROKE: 0.2500 mg/kg | PACK | Freq: Once | INTRAVENOUS | Status: DC

## 2021-10-10 MED ORDER — ONDANSETRON 4 MG PO TBDP: 4.0000 mg | ORAL_TABLET | Freq: Four times a day (QID) | ORAL | Status: DC | PRN

## 2021-10-10 MED ORDER — ALPRAZOLAM 0.5 MG PO TABS: 0.5000 mg | ORAL_TABLET | Freq: Three times a day (TID) | ORAL | Status: DC | PRN

## 2021-10-10 MED ORDER — ACETAMINOPHEN 650 MG RE SUPP: 650.0000 mg | RECTAL | Status: DC | PRN

## 2021-10-10 MED ORDER — STROKE: EARLY STAGES OF RECOVERY BOOK
Freq: Once | Status: AC
  Filled 2021-10-10: qty 1

## 2021-10-10 MED ORDER — IOHEXOL 350 MG/ML SOLN
100.0000 mL | Freq: Once | INTRAVENOUS | Status: AC | PRN
  Administered 2021-10-10: 75 mL via INTRAVENOUS

## 2021-10-10 MED ORDER — PANTOPRAZOLE SODIUM 40 MG IV SOLR
40.0000 mg | Freq: Every day | INTRAVENOUS | Status: DC
  Administered 2021-10-10 – 2021-10-11 (×2): 40 mg via INTRAVENOUS
  Filled 2021-10-10 (×2): qty 10

## 2021-10-10 MED ORDER — SODIUM CHLORIDE 0.9 % IV SOLN
1.0000 g | INTRAVENOUS | Status: DC
  Administered 2021-10-10: 1 g via INTRAVENOUS
  Filled 2021-10-10 (×2): qty 10

## 2021-10-10 MED ORDER — ACETAMINOPHEN 325 MG PO TABS: 650.0000 mg | ORAL_TABLET | ORAL | Status: DC | PRN

## 2021-10-10 MED ORDER — LEVOTHYROXINE SODIUM 75 MCG PO TABS
75.0000 ug | ORAL_TABLET | Freq: Every day | ORAL | Status: DC
  Administered 2021-10-12: 75 ug via ORAL
  Filled 2021-10-10: qty 1

## 2021-10-10 MED ORDER — CLEVIDIPINE BUTYRATE 0.5 MG/ML IV EMUL: 0.0000 mg/h | INTRAVENOUS | Status: DC

## 2021-10-10 MED ORDER — SODIUM CHLORIDE 0.9 % IV SOLN: INTRAVENOUS | Status: DC

## 2021-10-10 NOTE — ED Provider Notes (Signed)
?[facility] ?Provider Note ? ? ?CSN: [id number] ?Arrival date & time: 10/10/21  1409 ? ?An emergency department physician performed an initial assessment on this suspected stroke patient at 60. ? ?History ? ?Chief Complaint  ?Patient presents with  ? Extremity Weakness  ? ? ?Maria Lang is a 41 y.o. female. ? ?HPI ? ?  ? ?42 year old female comes in with chief complaint of left-sided weakness. ? ?Patient was last known normal around 12:30 PM, at which time, she went to order food and noted that her left upper extremity was weak. ? ?Patient indicates that he has had stroke in the past with no residual effects.  She also has history of hypertension, hyperlipidemia and is noted to be breathing heavily -indicates that she has history of anxiety. ? ?Home Medications ?Prior to Admission medications   ?Medication Sig Start Date End Date Taking? Authorizing Provider  ?amLODipine (NORVASC) 10 MG tablet Take 1 tablet by mouth daily. 10/03/21  Yes [provider]  ?terbinafine (LAMISIL) 250 MG tablet Take 1 tablet by mouth daily. 10/03/21  Yes [provider]  ?acetaminophen (TYLENOL) 325 MG tablet Take 2 tablets (650 mg total) by mouth every 6 (six) hours as needed for moderate pain. 08/04/21   Jaynee Eagles, PA-C  ?ALPRAZolam (XANAX) 1 MG tablet Take 0.5-1 tablets by mouth 3 (three) times daily as needed for anxiety. 01/01/17   [provider]  ?amLODipine (NORVASC) 10 MG tablet Take 1 tablet (10 mg total) by mouth daily. 12/05/14   Evalee Jefferson, PA-C  ?aspirin EC 81 MG EC tablet Take 1 tablet (81 mg total) by mouth daily. 12/23/18   Kayleen Memos, DO  ?atorvastatin (LIPITOR) 40 MG tablet Take 1 tablet (40 mg total) by mouth daily at 6 PM. 12/23/18   Kayleen Memos, DO  ?atorvastatin (LIPITOR) 80 MG tablet Take 80 mg by mouth daily. 09/13/21   [provider]  ?cyclobenzaprine (FLEXERIL) 10 MG tablet Take 1 tablet (10 mg total) by mouth 2 (two) times daily as needed for  muscle spasms. 11/24/20   Faustino Congress, NP  ?ezetimibe (ZETIA) 10 MG tablet Take 1 tablet (10 mg total) by mouth daily. 03/05/19   Frann Rider, NP  ?furosemide (LASIX) 20 MG tablet Take 1 tablet (20 mg total) by mouth daily as needed for edema. 08/04/20 09/21/21  Arnoldo Lenis, MD  ?levothyroxine (SYNTHROID) 75 MCG tablet Take 75 mcg by mouth daily before breakfast. 02/28/19   [provider]  ?losartan-hydrochlorothiazide (HYZAAR) 100-12.5 MG tablet Take 1 tablet by mouth daily. 05/31/21   Arnoldo Lenis, MD  ?ondansetron (ZOFRAN ODT) 4 MG disintegrating tablet Take 1 tablet (4 mg total) by mouth every 6 (six) hours as needed for nausea or vomiting. 09/07/21   Zehr, Janett Billow D, PA-C  ?pantoprazole (PROTONIX) 40 MG tablet TAKE 1 TABLET BY MOUTH TWICE DAILY BEFORE BREAKFAST AND DINNER 11/04/18   Ladene Artist, MD  ?propranolol ER (INDERAL LA) 120 MG 24 hr capsule Take 1 capsule (120 mg total) by mouth daily. 12/23/18   Kayleen Memos, DO  ?propranolol ER (INDERAL LA) 60 MG 24 hr capsule Take 60 mg by mouth daily. 09/13/21   [provider]  ?sertraline (ZOLOFT) 100 MG tablet Take 150 mg by mouth daily. 10/01/18   [provider]  ?   ? ?Allergies    ?Hydrocodeine [dihydrocodeine], Hydrocodone-acetaminophen, Coreg [carvedilol], and Labetalol   ? ?Review of Systems   ?Review of Systems  ?All other systems  reviewed and are negative. ? ?Physical Exam ?Updated Vital Signs ?BP 110/83   Pulse 78   Resp 18   Wt 84.7 kg   SpO2 92%   BMI 29.25 kg/m?  ?Physical Exam ?Vitals and nursing note reviewed.  ?Constitutional:   ?   Appearance: She is well-developed.  ?HENT:  ?   Head: Atraumatic.  ?Eyes:  ?   Extraocular Movements: Extraocular movements intact.  ?   Pupils: Pupils are equal, round, and reactive to light.  ?Cardiovascular:  ?   Rate and Rhythm: Normal rate.  ?Pulmonary:  ?   Effort: Pulmonary effort is normal.  ?Musculoskeletal:  ?   Cervical back: Normal range of motion and neck  supple.  ?Skin: ?   General: Skin is warm and dry.  ?Neurological:  ?   Mental Status: She is alert and oriented to person, place, and time.  ?   Comments: Left upper extremity and lower extremity drift, left lower extremity subjective numbness  ? ? ?ED Results / Procedures / Treatments   ?Labs ?(all labs ordered are listed, but only abnormal results are displayed) ?Labs Reviewed  ?CBC - Abnormal; Notable for the following components:  ?    Result Value  ? Hemoglobin 11.1 (*)   ? HCT 35.7 (*)   ? All other components within normal limits  ?I-STAT CHEM 8, ED - Abnormal; Notable for the following components:  ? Creatinine, Ser 1.20 (*)   ? Glucose, Bld 154 (*)   ? All other components within normal limits  ?CBG MONITORING, ED - Abnormal; Notable for the following components:  ? Glucose-Capillary 159 (*)   ? All other components within normal limits  ?RESP PANEL BY RT-PCR (FLU A&B, COVID) ARPGX2  ?PROTIME-INR  ?APTT  ?DIFFERENTIAL  ?ETHANOL  ?COMPREHENSIVE METABOLIC PANEL  ?RAPID URINE DRUG SCREEN, HOSP PERFORMED  ?URINALYSIS, ROUTINE W REFLEX MICROSCOPIC  ?POC URINE PREG, ED  ? ? ?EKG ?EKG Interpretation ? ?Date/Time:  Monday October 10 2021 14:54:04 EDT ?Ventricular Rate:  70 ?PR Interval:  188 ?QRS Duration: 93 ?QT Interval:  400 ?QTC Calculation: 432 ?R Axis:   36 ?Text Interpretation: Sinus rhythm No acute changes No significant change since last tracing Confirmed by Varney Biles [phone]) on 10/10/2021 3:17:05 PM ? ?Radiology ?CT HEAD CODE STROKE WO CONTRAST ? ?Result Date: 10/10/2021 ?CLINICAL DATA:  Code stroke.  Tingling on left side EXAM: CT HEAD WITHOUT CONTRAST TECHNIQUE: Contiguous axial images were obtained from the base of the skull through the vertex without intravenous contrast. RADIATION DOSE REDUCTION: This exam was performed according to the departmental dose-optimization program which includes automated exposure control, adjustment of the mA and/or kV according to patient size and/or use of iterative  reconstruction technique. COMPARISON:  Brain MRI 12/22/2018 FINDINGS: Brain: There is no evidence of acute intracranial hemorrhage, extra-axial fluid collection, or acute infarct. Parenchymal volume is normal. The ventricles are normal in size. Gray-white differentiation is preserved. There is no mass lesion.  There is no mass effect or midline shift. Vascular: No hyperdense vessel or unexpected calcification. Skull: Normal. Negative for fracture or focal lesion. Sinuses/Orbits: Postsurgical changes are noted in the paranasal sinuses. There is mild mucosal thickening in the right maxillary sinus. The globes and orbits are unremarkable. Other: None. ASPECTS [facility] Stroke Program Early CT Score) - Ganglionic level infarction (caudate, lentiform nuclei, internal capsule, insula, M1-M3 cortex): 7 - Supraganglionic infarction (M4-M6 cortex): 3 Total score (0-10 with 10 being normal): 10 IMPRESSION: 1. No acute intracranial pathology. 2.  ASPECTS is 10 Results of the noncontrast head CT were called by telephone at the time of interpretation on 10/10/2021 at 2:33 pm to provider [facility] , who verbally acknowledged these results. Electronically Signed   By: Valetta Mole M.D.   On: 10/10/2021 14:42   ? ?Procedures ?[name] KitchenCritical Care ?Performed by: Varney Biles, MD ?Authorized by: Varney Biles, MD  ? ?Critical care provider statement:  ?  Critical care time (minutes):  38 ?  Critical care was necessary to treat or prevent imminent or life-threatening deterioration of the following conditions:  Circulatory failure and CNS failure or compromise ?  Critical care was time spent personally by me on the following activities:  Development of treatment plan with patient or surrogate, discussions with consultants, evaluation of patient's response to treatment, examination of patient, ordering and review of laboratory studies, ordering and review of radiographic studies, ordering and performing treatments and interventions,  pulse oximetry, re-evaluation of patient's condition and review of old charts  ? ? ?Medications Ordered in ED ?Medications  ?clevidipine (CLEVIPREX) infusion 0.5 mg/mL (has no administration in time range)  ? ? ?ED

## 2021-10-10 NOTE — ED Notes (Signed)
Pt sitting up texting on phone. Denies any pain. Son at bedside. This nurse remains at bedside.  ?

## 2021-10-10 NOTE — Progress Notes (Signed)
Patient arrived to [facility] ICU by [facility].  ?

## 2021-10-10 NOTE — ED Notes (Signed)
[facility] at bedside with pt at this time ?

## 2021-10-10 NOTE — ED Notes (Signed)
Pt opening eyes to talk to children. Pt states she feels drowsy and did not feel like that prior to lunch today.  ?

## 2021-10-10 NOTE — Consult Note (Signed)
[facility] ?TeleNeurology Consult Services  ?  ?Date of Service:  10/10/2021  ?  ?  ?Metrics: ?Last Known Well: 1240 ?Symptoms: As per HPI.  ?Patient is a candidate for thrombolytic.  ? Location of the provider: [facility] ? Location of the patient: [facility] ?Pre-Morbid Modified Rankin Scale: 0 ?Time Code Stroke Page received:  2:18 PM ?Time neurologist arrived:  2:25 PM ? ?This consult was provided via telemedicine with 2-way video and audio communication. The patient/family was informed that care would be provided in this way and agreed to receive care in this manner. ?  ?ED Physician notified of diagnostic impression and management plan at: 3:10 PM ?  ?Assessment: 41 year old female with a prior history of brainstem stroke presenting to the ED with acute onset of left sided weakness  ?-  Exam reveals mild left sided weakness and mildly decreased level of alertness. NIHSS 3. ?- CT head negative for acute abnormality ?- Stroke risk factors: HLD, HTN, prior stroke, obesity and sleep apnea ?- DDx for presentation includes top of the basilar syndrome given mildly depressed level of alertness and unilateral weakness. Also on the DDx is conversion disorder/stress related symptoms ?- Given the potential severe disability that could occur if there is a distal basal artery thrombus that then propagates/extends, the overall benefits of TNK are felt to outweigh the risks.  ?- After comprehensive review of possible contraindications, she has no absolute contraindications to TNK administration. ?- Patient is an IV thrombolysis candidate. Discussed extensively the risks/benefits of IV thrombolysis treatment vs. no treatment with the patient, including risks of hemorrhage and death with IV thrombolysis administration versus worse overall outcomes on average in patients within thrombolysis time window who are not administered TNK. Overall benefits of TNK regarding long-term prognosis are  felt to outweigh risks. The patient expressed understanding and wish to proceed with TNK.  ?- Will need STAT CTA of head and neck following TNK administration to evaluate for a distal basilar artery thrombus that is large enough to be visualized and amenable to catheter-based procedure  ?  ?  ?Recommendations: ?1. Will need to be transferred to [facility] and admitted to the Neuro ICU.  ?2. Post-TNK order set to include frequent neuro checks and BP management.  ?3. No antiplatelet medications or anticoagulants for at least 24 hours following TNK.  ?4. DVT prophylaxis with SCDs.  ?5. Will need to be started on a statin.  ?6. Will need escalation of antiplatelet therapy if follow up CT at 24 hours is negative for hemorrhagic conversion. ?7. Carotid ultrasound to further evaluate left ICA atherosclerotic narrowing. May be candidate for CEA.  ?8. TTE.  ?9. MRI brain, MRA head.  ?10. PT/OT/Speech.  ?11. NPO until passes swallow evaluation.  ?12. Sliding scale insulin.  ?13. Telemetry monitoring ?14. Fasting lipid panel, HgbA1c ?15. Telemetry monitoring. ?   ?Addendum: CTA of head and neck completed, with no LVO seen. There is moderate stenosis of the right P2, which is new relative to the prior study.  ?------------------------------------------------------------------------------ ?  ?History of Present Illness: 41 year old female with a prior history of stroke in 2020, HLD, HTN, obesity and sleep apnea who presents to the ED with acute onset of left sided weakness beginning early this afternoon. TOSO is the same as LKN: 1240. She was just returning to her job when she had acute onset of left arm and leg weakness without numbness. She was taken to the ED by EMS. On  arrival left sided drift was noted and a Code Stroke was called. CT head was negative for acute abnormality. ? ?The patient appears to have mildly depressed level of alertness, but is with fluent speech and intact comprehension. She states that prior to onset of her  symptoms "I kept yawning". She checked the mirror and noticed that her pupils were dilated. She states that she has no other neurological symptoms, including no visual field cut, facial weakness, sensory numbness, trouble understanding speech, trouble talking, CP or SOB. No headache. No limb or back pain.  ? ?She states that she has no residual deficits from her prior stroke.  ? ?On ASA and atorvastatin at home.  ? ?Prior MRI brain from 12/22/18: ?There is an area abnormal diffusion restriction within the ventral right pons. The area measures approximately 1.6 x 0.8 cm. ?  ?  ?Past Medical History: ?Past Medical History:  ?Diagnosis Date  ? Anxiety   ? Deviated septum   ? GERD (gastroesophageal reflux disease)   ? History of bilateral salpingectomy   ? Hyperlipidemia   ? Hypertension   ? Renal stones   ? Sleep apnea   ? Doesn't use everyday  ? Stroke [facility])   ? Thyroid disease   ? ? ?  ?Past Surgical History: ?Past Surgical History:  ?Procedure Laterality Date  ? BILATERAL SALPINGECTOMY  07.2016  ? CYSTOSCOPY WITH RETROGRADE PYELOGRAM, URETEROSCOPY AND STENT PLACEMENT Right 07/29/2015  ? Procedure: CYSTOSCOPY WITH RIGHT RETROGRADE PYELOGRAM, RIGHT URETEROSCOPY AND STENT PLACEMENT;  Surgeon: Irine Seal, MD;  Location: [facility];  Service: Urology;  Laterality: Right;  ? ECTOPIC PREGNANCY SURGERY  10/2013  ? HOLMIUM LASER APPLICATION Right 3/0/1601  ? Procedure: HOLMIUM LASER APPLICATION;  Surgeon: Irine Seal, MD;  Location: [facility];  Service: Urology;  Laterality: Right;  ? NASAL SEPTUM SURGERY    ? doen along with tonsillectomy   ? TONSILLECTOMY    ? TYMPANOSTOMY    ? x 3  ? ? ? ?  ?Medications:  ? ?No current facility-administered medications on file prior to encounter.  ? ?Current Outpatient Medications on File Prior to Encounter  ?Medication Sig Dispense Refill  ? amLODipine (NORVASC) 10 MG tablet Take 1 tablet by mouth daily.    ? terbinafine (LAMISIL) 250 MG tablet Take 1 tablet by mouth daily.    ? acetaminophen  (TYLENOL) 325 MG tablet Take 2 tablets (650 mg total) by mouth every 6 (six) hours as needed for moderate pain. 90 tablet 0  ? ALPRAZolam (XANAX) 1 MG tablet Take 0.5-1 tablets by mouth 3 (three) times daily as needed for anxiety.  1  ? amLODipine (NORVASC) 10 MG tablet Take 1 tablet (10 mg total) by mouth daily. 30 tablet 0  ? aspirin EC 81 MG EC tablet Take 1 tablet (81 mg total) by mouth daily. 120 tablet 0  ? atorvastatin (LIPITOR) 40 MG tablet Take 1 tablet (40 mg total) by mouth daily at 6 PM. 60 tablet 0  ? atorvastatin (LIPITOR) 80 MG tablet Take 80 mg by mouth daily.    ? cyclobenzaprine (FLEXERIL) 10 MG tablet Take 1 tablet (10 mg total) by mouth 2 (two) times daily as needed for muscle spasms. 20 tablet 0  ? ezetimibe (ZETIA) 10 MG tablet Take 1 tablet (10 mg total) by mouth daily. 90 tablet 3  ? furosemide (LASIX) 20 MG tablet Take 1 tablet (20 mg total) by mouth daily as needed for edema. 90 tablet 3  ? levothyroxine (SYNTHROID) 75  MCG tablet Take 75 mcg by mouth daily before breakfast.    ? losartan-hydrochlorothiazide (HYZAAR) 100-12.5 MG tablet Take 1 tablet by mouth daily. 30 tablet 6  ? ondansetron (ZOFRAN ODT) 4 MG disintegrating tablet Take 1 tablet (4 mg total) by mouth every 6 (six) hours as needed for nausea or vomiting. 120 tablet 2  ? pantoprazole (PROTONIX) 40 MG tablet TAKE 1 TABLET BY MOUTH TWICE DAILY BEFORE BREAKFAST AND DINNER (Patient taking differently: Take 40 mg by mouth 2 (two) times daily before a meal.) 180 tablet 0  ? propranolol ER (INDERAL LA) 120 MG 24 hr capsule Take 1 capsule (120 mg total) by mouth daily. 30 capsule 0  ? propranolol ER (INDERAL LA) 60 MG 24 hr capsule Take 60 mg by mouth daily.    ? sertraline (ZOLOFT) 100 MG tablet Take 150 mg by mouth daily.    ? ? ?  ?   ?Social History: ?Former smoker ?No EtOH or drug use.  ?  ?Family History:  ?Reviewed in Epic ?  ?ROS: As per HPI  ?  ?Anticoagulant use:  No ?  ?Antiplatelet use: ASA ?  ?Examination: ?  ?BP 110/83    Pulse 78   Resp 18   Wt 84.7 kg   SpO2 92%   BMI 29.25 kg/m?  ?  ? ?1A: Level of Consciousness - 1 ?1B: Ask Month and Age - 0 ?1C: Blink Eyes & Squeeze Hands - 0 ?2: Test Horizontal Extraocular Movements - 0 ?

## 2021-10-10 NOTE — ED Notes (Signed)
Lab at bedside drawing blood at this time.  ?

## 2021-10-10 NOTE — ED Notes (Signed)
Pt is sitting up holding phone and texting opening eyes while children are at bedside talking to her.  ?

## 2021-10-10 NOTE — ED Triage Notes (Signed)
Pt brought in by [facility] with c/o left arm and leg weakness that started between 1200-1230 today. Previous hx of CVA with no deficits. Pt has left arm drift during initial assessment by MD. Code stroke activated and pt taken to CT with Carroll Kinds, RN accompanying pt.  ?

## 2021-10-10 NOTE — ED Notes (Signed)
Tele-neuro MD still discussing hx with pt. No NIH performed yet.  ?

## 2021-10-10 NOTE — Progress Notes (Signed)
Code stroke activated on telestroke cart at 1414. Patient already seen by provider and in CT at time of cart activation. Returned from CT at 1425. Dr. Cheral Marker also connected to telestroke cart for code stroke evaluation at 1425. ?

## 2021-10-10 NOTE — H&P (Signed)
Neurology H&P ? ?CC: tx post TNK from [facility] ? ?History is obtained from: Patient, chart ? ?HPI: Maria Lang is a 41 y.o. female past medical history of pontine stroke in 2020 without residual deficits, hypertension, hyperlipidemia, obesity, sleep apnea presented to the ED at [facility] with acute onset of left-sided weakness with last known well around 12:40 PM.  She was at work, went to get some food at [employer] and started feeling unwell.  Had sudden onset of left arm and leg weakness.  No sensory symptoms.  Brought in to the ED by EMS.  Left-sided drift, depressed level of consciousness-NIH of 3.  TNKase was given at 1508 hrs. ?Transferred over to [facility] for further care. ?On aspirin and statin at home ?Denies any preceding illness sickness. ?Denies shortness of breath nausea vomiting. ?Denies headache.  Denies tingling or numbness. ? ? ?LKW: 12:40 PM ?tpa given?:  Yes-at [facility] ?Premorbid modified Rankin scale (mRS): 0 ? ?ROS: Full ROS was performed and is negative except as noted in the HPI.  ? ?Past Medical History:  ?Diagnosis Date  ? Anxiety   ? Deviated septum   ? GERD (gastroesophageal reflux disease)   ? History of bilateral salpingectomy   ? Hyperlipidemia   ? Hypertension   ? Renal stones   ? Sleep apnea   ? Doesn't use everyday  ? Stroke [facility])   ? Thyroid disease   ? ? ?Family History  ?Problem Relation Age of Onset  ? Hypertension Mother   ? Hypertension Father   ? Hyperlipidemia Father   ? Diabetes Brother   ? Diabetes Other   ? ?Social History:  ? reports that she quit smoking about 18 months ago. Her smoking use included cigarettes. She started smoking about 24 years ago. She has a 3.50 pack-year smoking history. She has never used smokeless tobacco. She reports that she does not drink alcohol and does not use drugs. ? ?Medications ? ?Current Facility-Administered Medications:  ?   stroke: early stages of recovery book, , Does not apply, Once, Amie Portland, MD ?   0.9 %  sodium chloride infusion, , Intravenous, Continuous, Amie Portland, MD ?  acetaminophen (TYLENOL) tablet 650 mg, 650 mg, Oral, Q4H PRN **OR** acetaminophen (TYLENOL) 160 MG/5ML solution 650 mg, 650 mg, Per Tube, Q4H PRN **OR** acetaminophen (TYLENOL) suppository 650 mg, 650 mg, Rectal, Q4H PRN, Amie Portland, MD ?  ALPRAZolam Duanne Moron) tablet 0.5-1 mg, 0.5-1 mg, Oral, TID PRN, Amie Portland, MD ?  clevidipine (CLEVIPREX) infusion 0.5 mg/mL, 0-21 mg/hr, Intravenous, Continuous, Kerney Elbe, MD ?  Derrill Memo ON 10/11/2021] levothyroxine (SYNTHROID) tablet 75 mcg, 75 mcg, Oral, QAC breakfast, Amie Portland, MD ?  ondansetron (ZOFRAN-ODT) disintegrating tablet 4 mg, 4 mg, Oral, Q6H PRN, Amie Portland, MD ?  pantoprazole (PROTONIX) injection 40 mg, 40 mg, Intravenous, QHS, Amie Portland, MD ?  senna-docusate (Senokot-S) tablet 1 tablet, 1 tablet, Oral, QHS PRN, Amie Portland, MD ?  sertraline (ZOLOFT) tablet 150 mg, 150 mg, Oral, Daily, Amie Portland, MD ? ?Exam: ?Current vital signs: ?BP 117/90   Pulse 67   Temp 98.5 ?F (36.9 ?C)   Resp 19   Ht '5\' 7"'$  (1.702 m)   Wt 84.7 kg   LMP 10/03/2021 (Exact Date)   SpO2 99%   BMI 29.25 kg/m?  ?Vital signs in last 24 hours: ?Temp:  [98.5 ?F (36.9 ?C)-98.6 ?F (37 ?C)] 98.5 ?F (36.9 ?C) (04/17 1710) ?Pulse Rate:  [62-78] 67 (04/17 1724) ?Resp:  [  14-26] 19 (04/17 1724) ?BP: (99-132)/(72-95) 117/90 (04/17 1724) ?SpO2:  [92 %-100 %] 99 % (04/17 1724) ?Weight:  [84.7 kg] 84.7 kg (04/17 1504) ?General: Awake alert in no distress ?HEENT: Normocephalic/atraumatic ?Lungs clear ?Cardiovascular: Regular rate rhythm ?Abdomen nondistended nontender ?Extremities warm well perfused ?Neurological exam ?Awake alert oriented x3 ?No dysarthria ?No aphasia ?Cranial nerves II to XII intact ?Motor examination with very mild drift in the left lower extremity.  Otherwise grossly symmetric without drift. ?Sensation intact to light touch all over ?Coordination with no evidence of dysmetria ?Gait  testing deferred ?NIH stroke scale-1. ? ?Labs ?I have reviewed labs in epic and the results pertinent to this consultation are: ? ? ?CBC ?   ?Component Value Date/Time  ? WBC 7.9 10/10/2021 1434  ? RBC 4.17 10/10/2021 1434  ? HGB 12.6 10/10/2021 1438  ? HCT 37.0 10/10/2021 1438  ? PLT 387 10/10/2021 1434  ? MCV 85.6 10/10/2021 1434  ? MCH 26.6 10/10/2021 1434  ? MCHC 31.1 10/10/2021 1434  ? RDW 14.6 10/10/2021 1434  ? LYMPHSABS 2.7 10/10/2021 1434  ? MONOABS 0.5 10/10/2021 1434  ? EOSABS 0.0 10/10/2021 1434  ? BASOSABS 0.0 10/10/2021 1434  ? ? ?CMP  ?   ?Component Value Date/Time  ? NA 139 10/10/2021 1438  ? K 3.5 10/10/2021 1438  ? CL 104 10/10/2021 1438  ? CO2 23 10/10/2021 1434  ? GLUCOSE 154 (H) 10/10/2021 1438  ? BUN 19 10/10/2021 1438  ? CREATININE 1.20 (H) 10/10/2021 1438  ? CALCIUM 9.9 10/10/2021 1434  ? PROT 8.3 (H) 10/10/2021 1434  ? ALBUMIN 4.7 10/10/2021 1434  ? AST 17 10/10/2021 1434  ? ALT 21 10/10/2021 1434  ? ALKPHOS 73 10/10/2021 1434  ? BILITOT 0.6 10/10/2021 1434  ? GFRNONAA >60 10/10/2021 1434  ? GFRAA >60 12/23/2018 0752  ?UA concerning for UTI ? ?Imaging ?I have reviewed the images obtained: ? ?CT-head-no acute changes.  CTA head and neck with no emergent LVO.  Moderate stenosis of the right P2 which is relatively new to the prior study. ? ?Prior MRI from 2020 with acute right ventral pontine stroke 1.6 x 0.8 cm. ? ?Assessment: 41 year old woman prior history of right pontine stroke without residual deficits, presented to [facility] for sudden onset of left-sided weakness NIH stroke scale 3, given TNKase. ?Has multiple cerebrovascular risk factors. ?Transferred over to [facility] for further evaluation ?CT head and neck done emergently to rule out posterior circulation thrombus-negative for emergent LVO or thrombus but shows moderate right P2 stenosis which is relatively new to the prior study from 2020. ?Labs also show UTI-could be recrudescence of old symptoms versus a new  stroke. ? ?Impression: ?Acute ischemic stroke-versus recrudescence of old stroke symptoms in the setting of UTI ?Status post IV TNKase ?UTI ? ?Recommendations: ?Admit to neuro ICU ?Post thrombolysis neurochecks and vitals ?No antiplatelets or anticoagulants for 24 hours from [facility] administration. ?MRI brain 24 hours from [facility] administration ?Systolic blood pressure goal less than 180 ?Gentle hydration with normal saline 75 cc an hour ?Creatinine mildly elevated-check labs again tomorrow morning. ?No electrolyte abnormalities-check CMP in the morning ?Patient was given TNK at the outside hospital.  Watch for any overt signs of bleeding.  Call neurology emergently if any concern. ?2D echo ?A1c ?Lipid panel ?PT ?OT next speech therapy ?N.p.o. until cleared by bedside swallow screen or speech eval ?UA concerning for UTI-start IV ceftriaxone and wait for cultures ?Check chest x-ray ?Stroke team will follow ? ?-- ?  Amie Portland, MD ?Neurologist ?[facility] ?Pager: [phone] ? ?CRITICAL CARE ATTESTATION ?Performed by: Amie Portland, MD ?Total critical care time: 30 minutes ?Critical care time was exclusive of separately billable procedures and treating other patients and/or supervising APPs/Residents/Students ?Critical care was necessary to treat or prevent imminent or life-threatening deterioration due to AIS, s/p IV thrombolysis  ?This patient is critically ill and at significant risk for neurological worsening and/or death and care requires constant monitoring. ?Critical care was time spent personally by me on the following activities: development of treatment plan with patient and/or surrogate as well as nursing, discussions with consultants, evaluation of patient's response to treatment, examination of patient, obtaining history from patient or surrogate, ordering and performing treatments and interventions, ordering and review of laboratory studies, ordering and review of radiographic studies, pulse  oximetry, re-evaluation of patient's condition, participation in multidisciplinary rounds and medical decision making of high complexity in the care of this patient. ?

## 2021-10-10 NOTE — ED Notes (Signed)
Pt resting with eyes closed. Awakens easily. Son and this nurse remain at bedside.  ?

## 2021-10-10 NOTE — ED Notes (Signed)
Pt speaking clearly reviewing med rec. Pt knows all medications and when she last took medication.  ?

## 2021-10-11 ENCOUNTER — Inpatient Hospital Stay (HOSPITAL_COMMUNITY): Payer: BC Managed Care – PPO

## 2021-10-11 DIAGNOSIS — I517 Cardiomegaly: Secondary | ICD-10-CM

## 2021-10-11 DIAGNOSIS — I6389 Other cerebral infarction: Secondary | ICD-10-CM | POA: Diagnosis not present

## 2021-10-11 DIAGNOSIS — I5030 Unspecified diastolic (congestive) heart failure: Secondary | ICD-10-CM

## 2021-10-11 LAB — ECHOCARDIOGRAM COMPLETE
AR max vel: 2.65 cm2
AV Area VTI: 2.8 cm2
AV Area mean vel: 2.63 cm2
AV Mean grad: 3 mmHg
AV Peak grad: 6 mmHg
Ao pk vel: 1.22 m/s
Area-P 1/2: 2.95 cm2
Calc EF: 69.7 %
Height: 67 in
S' Lateral: 2.3 cm
Single Plane A2C EF: 72.1 %
Single Plane A4C EF: 68.6 %
Weight: 2987.67 oz

## 2021-10-11 LAB — ANTITHROMBIN III: AntiThromb III Func: 130 % — ABNORMAL HIGH (ref 75–120)

## 2021-10-11 LAB — LIPID PANEL
Cholesterol: 136 mg/dL (ref 0–200)
HDL: 26 mg/dL — ABNORMAL LOW (ref >40)
LDL Cholesterol: 71 mg/dL (ref 0–99)
Total CHOL/HDL Ratio: 5.2 RATIO
Triglycerides: 197 mg/dL — ABNORMAL HIGH (ref <150)
VLDL: 39 mg/dL (ref 0–40)

## 2021-10-11 LAB — SEDIMENTATION RATE: Sed Rate: 30 mm/hr — ABNORMAL HIGH (ref 0–22)

## 2021-10-11 LAB — C-REACTIVE PROTEIN: CRP: 0.6 mg/dL (ref <1.0)

## 2021-10-11 IMAGING — MR MR HEAD W/O CM
6 of 10 series · 28 of 48 positions shown · non-contrast
Comparison: Head CT and CTA [DATE].  Head MRI [DATE].

CLINICAL DATA: Neuro deficit, acute, stroke suspected. Acute
left-sided weakness. History of stroke.

EXAM:
MRI HEAD WITHOUT CONTRAST
TECHNIQUE: Multiplanar, multiecho pulse sequences of the brain and surrounding
structures were obtained without intravenous contrast.

[Series 2: DWI · axial · 3.0mm · 0.94mm/px · z∈[-32,+106]mm · 8 of 94 slices shown (1 of 2)]
[im 1/94]
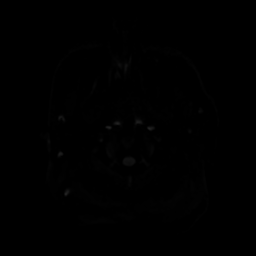
[im 11/94]
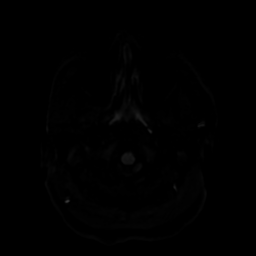
[im 32/94]
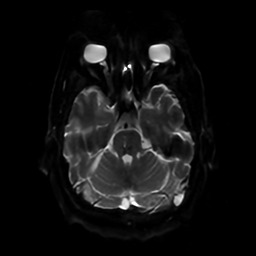
[im 42/94]
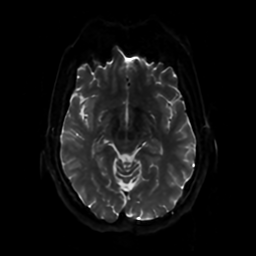
[im 52/94]
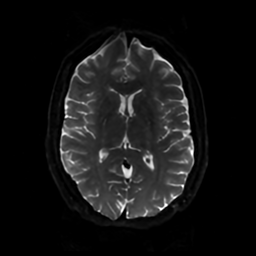
[im 63/94]
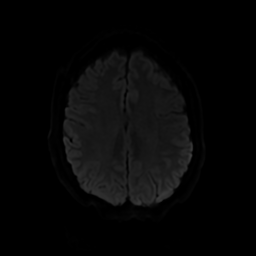
[im 83/94]
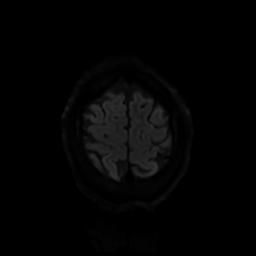
[im 94/94]
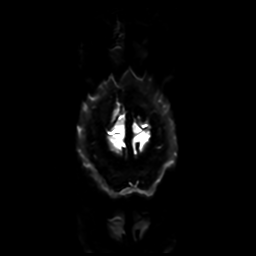

[Series 3: DWI · coronal · 4.0mm · 0.94mm/px · 7 of 69 slices shown (2 of 2)]
[im 1/69]
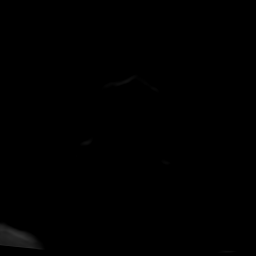
[im 12/69]
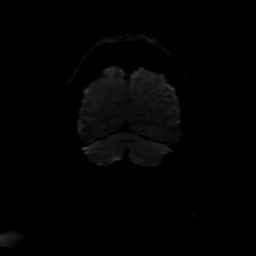
[im 23/69]
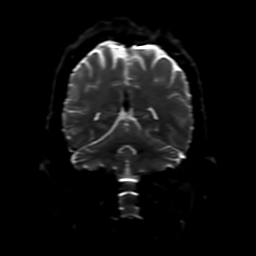
[im 35/69]
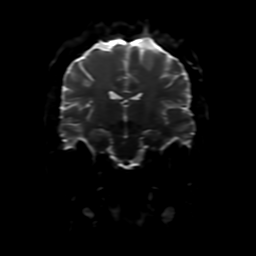
[im 46/69]
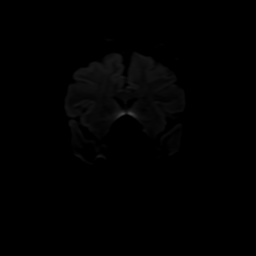
[im 57/69]
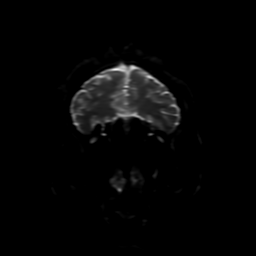
[im 69/69]
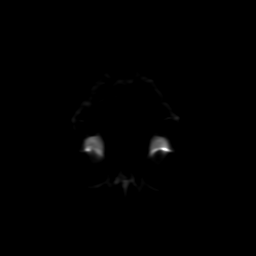

[Series 4: FLAIR · sagittal · 5.0mm · 0.23mm/px · 2 of 23 slices shown (1 of 2)]
[im 1/23]
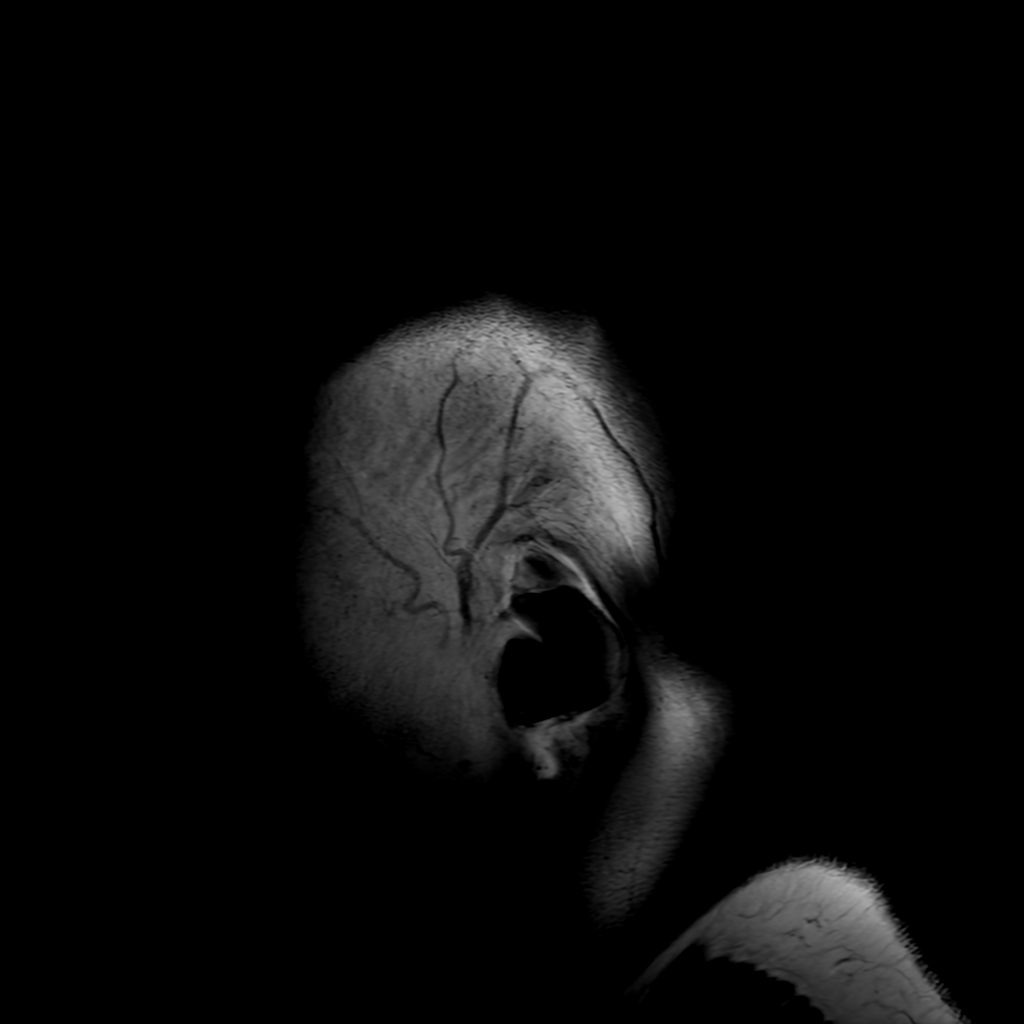
[im 23/23]
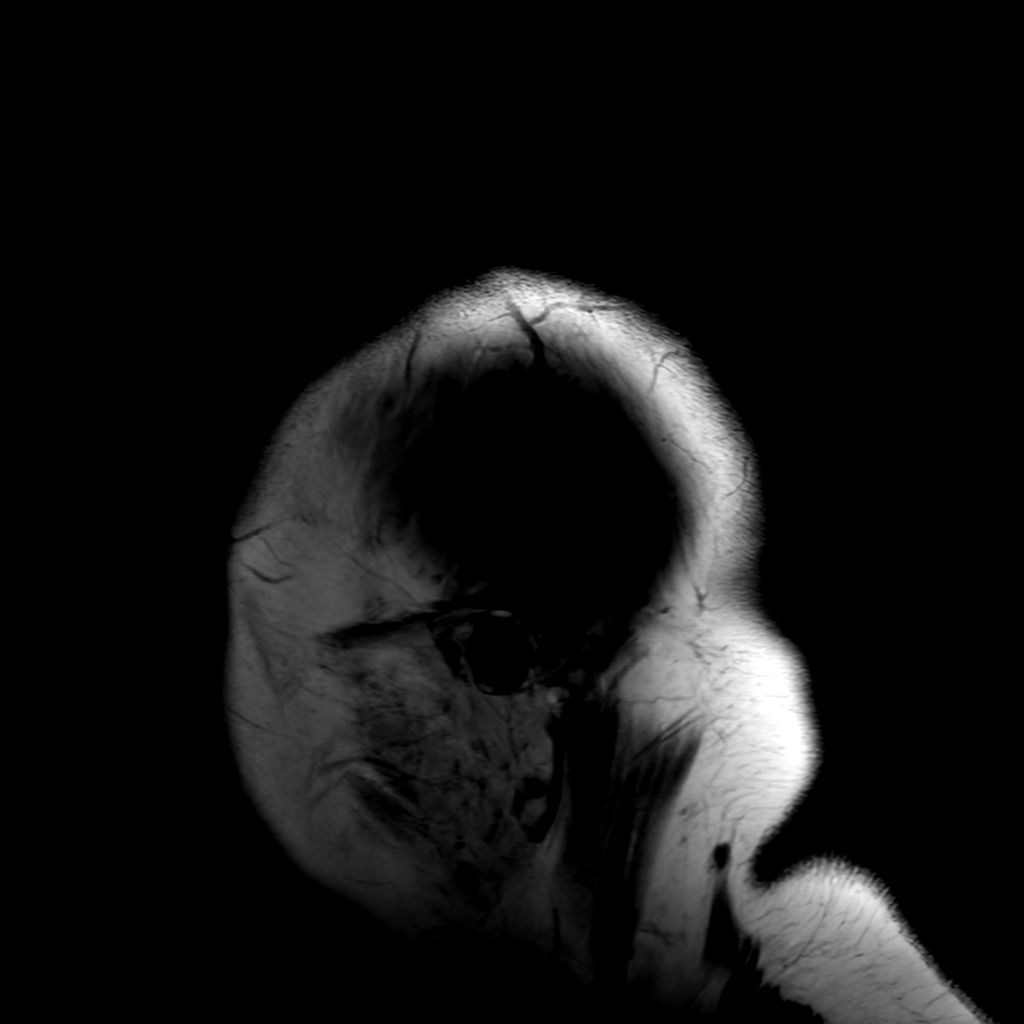

[Series 6: FLAIR · axial · 4.0mm · 0.45mm/px · z∈[-32,+108]mm · 3 of 33 slices shown (2 of 2)]
[im 1/33]
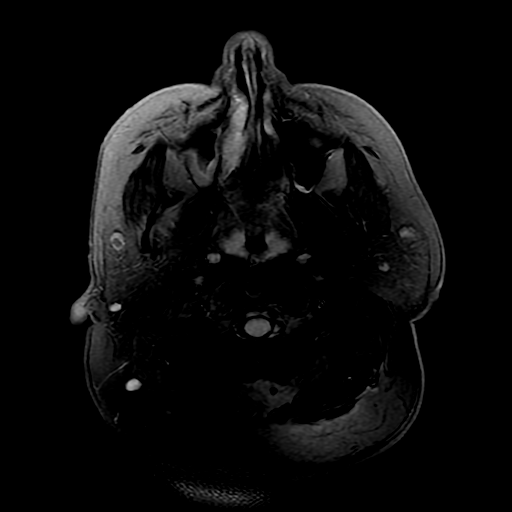
[im 17/33]
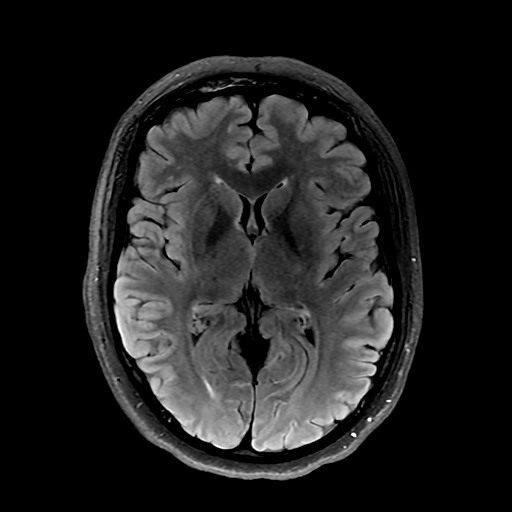
[im 33/33]
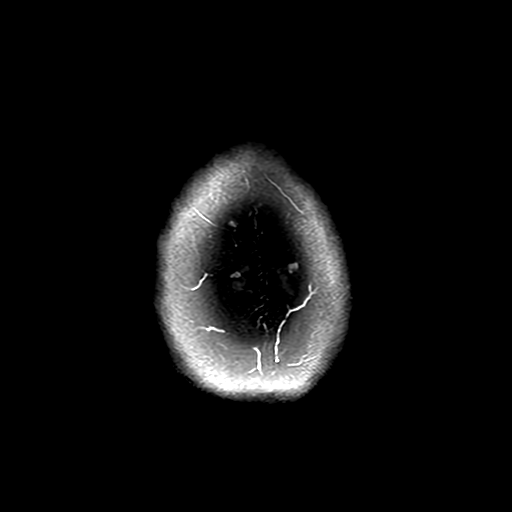

[Series 250: ADC · axial · 3.0mm · 0.94mm/px · z∈[-32,+106]mm · 5 of 47 slices shown (1 of 2)]
[im 1/47]
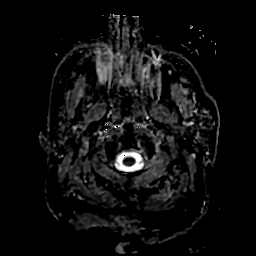
[im 12/47]
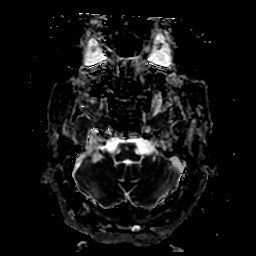
[im 24/47]
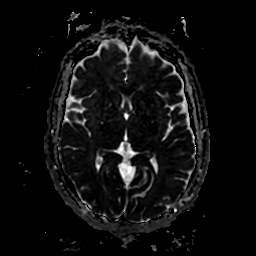
[im 35/47]
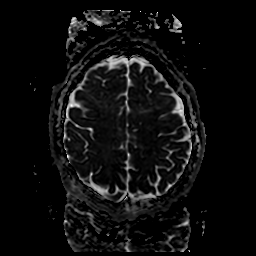
[im 47/47]
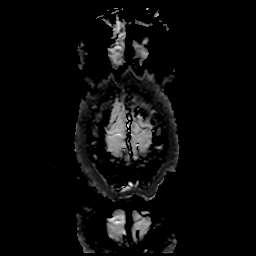

[Series 350: ADC · coronal · 4.0mm · 0.94mm/px · 3 of 35 slices shown (2 of 2)]
[im 1/35]
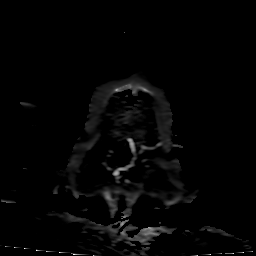
[im 18/35]
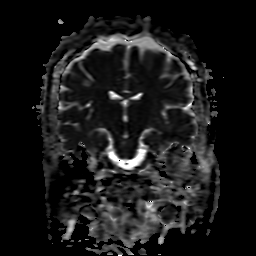
[im 35/35]
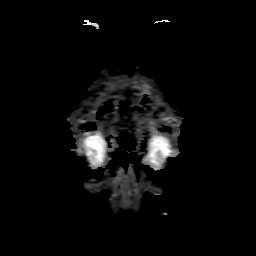

[28 of 48 positions shown; findings below may reference images not displayed]

FINDINGS: Brain: There is no evidence of an acute infarct, intracranial
hemorrhage, mass, midline shift, or extra-axial fluid collection. A
small chronic right pontine infarct is noted (acute on the prior
MRI). The ventricles and sulci are normal. A few small T2
hyperintensities are noted in the cerebral white matter, nonspecific
but may reflect early chronic small vessel ischemia given the
patient's vascular risk factors.

Vascular: Major intracranial vascular flow voids are preserved.

Skull and upper cervical spine: Unremarkable bone marrow signal.

Sinuses/Orbits: Unremarkable orbits. Mild mucosal thickening in the
paranasal sinuses. Prior functional endoscopic sinus surgery. No
significant mastoid fluid.

Other: None.
IMPRESSION: 1. No acute intracranial abnormality.
2. Chronic pontine infarct.

## 2021-10-11 MED ORDER — ASPIRIN EC 81 MG PO TBEC
81.0000 mg | DELAYED_RELEASE_TABLET | Freq: Every day | ORAL | Status: DC
  Administered 2021-10-11 – 2021-10-12 (×2): 81 mg via ORAL
  Filled 2021-10-11 (×2): qty 1

## 2021-10-11 MED ORDER — SODIUM CHLORIDE 0.9 % IV BOLUS
1000.0000 mL | Freq: Once | INTRAVENOUS | Status: AC
  Administered 2021-10-11: 1000 mL via INTRAVENOUS

## 2021-10-11 MED ORDER — CHLORHEXIDINE GLUCONATE CLOTH 2 % EX PADS
6.0000 | MEDICATED_PAD | Freq: Every day | CUTANEOUS | Status: DC
  Administered 2021-10-11 – 2021-10-12 (×2): 6 via TOPICAL

## 2021-10-11 MED ORDER — CLOPIDOGREL BISULFATE 75 MG PO TABS
75.0000 mg | ORAL_TABLET | Freq: Every day | ORAL | Status: DC
  Administered 2021-10-11 – 2021-10-12 (×2): 75 mg via ORAL
  Filled 2021-10-11 (×2): qty 1

## 2021-10-11 MED ORDER — ATORVASTATIN CALCIUM 80 MG PO TABS
80.0000 mg | ORAL_TABLET | Freq: Every day | ORAL | Status: DC
Start: 2021-10-11 — End: 2021-10-11
  Administered 2021-10-11: 80 mg via ORAL
  Filled 2021-10-11: qty 1

## 2021-10-11 MED ORDER — SODIUM CHLORIDE 0.9 % IV SOLN
1.0000 g | INTRAVENOUS | Status: AC
  Administered 2021-10-11 – 2021-10-12 (×2): 1 g via INTRAVENOUS
  Filled 2021-10-11: qty 10

## 2021-10-11 MED ORDER — ATORVASTATIN CALCIUM 40 MG PO TABS
40.0000 mg | ORAL_TABLET | Freq: Every day | ORAL | Status: DC
  Administered 2021-10-12: 40 mg via ORAL
  Filled 2021-10-11: qty 1

## 2021-10-11 NOTE — Progress Notes (Signed)
PT Cancellation Note ? ?Patient Details ?Name: Somya Jauregui ?MRN: [id number] ?DOB: May 16, 1981 ? ? ?Cancelled Treatment:    Reason Eval/Treat Not Completed: Active bedrest order (per TNK protocol). Will follow as activity orders are advanced.  ? ?Leighton Roach, PT  ?Acute Rehab Services ? Pager [phone] ?Office [phone] ? ? ? ?[place] ?10/11/2021, 8:14 AM ?

## 2021-10-11 NOTE — Evaluation (Signed)
Speech Language Pathology Evaluation ?Patient Details ?Name: Maria Lang ?MRN: [id number] ?DOB: 05-Nov-1980 ?Today's Date: 10/11/2021 ?Time: 1001-1019 ?SLP Time Calculation (min) (ACUTE ONLY): 18 min ? ?Problem List:  ?Patient Active Problem List  ? Diagnosis Date Noted  ? Acute stroke due to ischemia ([place]) 10/10/2021  ? Acute ischemic stroke ([place]) 10/10/2021  ? Rectal bleeding 09/07/2021  ? Nausea without vomiting 09/07/2021  ? Acute CVA (cerebrovascular accident) ([place]) 12/21/2018  ? Sleep apnea   ? Anxiety   ? Benzodiazepine dependence ([place])   ? Acute Left-sided weakness   ? Acute Speech abnormality   ? Smoker   ? Hyperglycemia   ? Hypothyroidism   ? Chest pain 12/10/2014  ? GERD (gastroesophageal reflux disease) 12/10/2014  ? Chronic diastolic CHF (congestive heart failure) ([place]) 12/10/2014  ? Hypertension   ? Pain in the chest   ? Hypertensive emergency   ? ?Past Medical History:  ?Past Medical History:  ?Diagnosis Date  ? Anxiety   ? Deviated septum   ? GERD (gastroesophageal reflux disease)   ? History of bilateral salpingectomy   ? Hyperlipidemia   ? Hypertension   ? Renal stones   ? Sleep apnea   ? Doesn't use everyday  ? Stroke [facility])   ? Thyroid disease   ? ?Past Surgical History:  ?Past Surgical History:  ?Procedure Laterality Date  ? BILATERAL SALPINGECTOMY  07.2016  ? CYSTOSCOPY WITH RETROGRADE PYELOGRAM, URETEROSCOPY AND STENT PLACEMENT Right 07/29/2015  ? Procedure: CYSTOSCOPY WITH RIGHT RETROGRADE PYELOGRAM, RIGHT URETEROSCOPY AND STENT PLACEMENT;  Surgeon: Irine Seal, MD;  Location: [facility];  Service: Urology;  Laterality: Right;  ? ECTOPIC PREGNANCY SURGERY  10/2013  ? HOLMIUM LASER APPLICATION Right 08/05/7987  ? Procedure: HOLMIUM LASER APPLICATION;  Surgeon: Irine Seal, MD;  Location: [facility];  Service: Urology;  Laterality: Right;  ? NASAL SEPTUM SURGERY    ? doen along with tonsillectomy   ? TONSILLECTOMY    ? TYMPANOSTOMY    ? x 3  ? ?HPI:  ?Pt is a 41 yo female presenting to [facility] with acute onset L  weakness s/p TNK and transferred to [facility]. CTH negative; MRI pending. Pt also found to have UTI. PMH includes: pontine stroke in 2020 without residual deficits (SLP cognitive-linguistic eval at that time with [employer] completed grossly [facility] with mild baseline articulation errors for which she had received speech therapy when she was in school), HTN, HLD, obesity, OSA, anxiety, deviated septum, GERD, thyroid disease  ? ?Assessment / Plan / Recommendation ?Clinical Impression ? Pt scored 27/30 on the SLUMS, suggestive of cognitive function that is [facility]. She denies any acute changes to cognition and communication. No further acute SLP f/u indicated at this time. SLP to sign off - pt in agreement. ?   ?SLP Assessment ? SLP Recommendation/Assessment: Patient does not need any further [facility] ?SLP Visit Diagnosis: Cognitive communication deficit (R41.841)  ?  ?Recommendations for follow up therapy are one component of a multi-disciplinary discharge planning process, led by the attending physician.  Recommendations may be updated based on patient status, additional functional criteria and insurance authorization. ?   ?Follow Up Recommendations ? No SLP follow up  ?  ?Assistance Recommended at Discharge ? PRN  ?Functional Status Assessment Patient has not had a recent decline in their functional status  ?Frequency and Duration    ?  ?  ?   ?SLP Evaluation ?Cognition ? Overall Cognitive Status: Within Functional Limits for tasks assessed ?Orientation Level: Oriented X4  ?  ?   ?  Comprehension ? Auditory Comprehension ?Overall Auditory Comprehension: Appears within functional limits for tasks assessed  ?  ?Expression Expression ?Primary Mode of Expression: Verbal ?Verbal Expression ?Overall Verbal Expression: Appears within functional limits for tasks assessed   ?Oral / Motor ? Motor Speech ?Overall Motor Speech: Appears within functional limits for tasks assessed   ?        ? ?Osie Bond., M.A.  CCC-SLP ?Acute Rehabilitation Services ?Office [phone] ? ?Secure chat preferred ? ?10/11/2021, 10:24 AM ? ?

## 2021-10-11 NOTE — Progress Notes (Signed)
OT Cancellation Note ? ?Patient Details ?Name: Maria Lang ?MRN: [id number] ?DOB: 03/15/1981 ? ? ?Cancelled Treatment:    Reason Eval/Treat Not Completed: Active bedrest order (OT evaluation to f/u after bedrest is lifted per TNK protcol) ? ?[name] ?10/11/2021, 7:34 AM ?

## 2021-10-11 NOTE — Progress Notes (Signed)
?  Transition of Care (TOC) Screening Note ? ? ?Patient Details  ?Name: Maria Lang ?Date of Birth: 1981/06/10 ? ? ?Transition of Care [facility]) CM/SW Contact:    ?Ella Bodo, RN ?Phone Number: ?10/11/2021, 4:47 PM ? ? ? ?Transition of Care Department [facility]) has reviewed patient and no TOC needs have been identified at this time. We will continue to monitor patient advancement through interdisciplinary progression rounds. If new patient transition needs arise, please place a TOC consult. ? ?Reinaldo Raddle, RN, BSN  ?Trauma/Neuro ICU Case Manager ?[phone] ? ?

## 2021-10-11 NOTE — Progress Notes (Signed)
?STROKE TEAM PROGRESS NOTE  ? ?INTERVAL HISTORY ?Maria Lang is a 41 y.o. female past medical history of pontine stroke in 2020 without residual deficits, hypertension, hyperlipidemia, obesity, sleep apnea presented to the ED at [facility] with acute onset of left-sided weakness and decreased level of consciousness. NIHSS of 3. TNK given. Initial CTH negative. CTA  with moderate focal stenosis of the R P2 segment, new since 2020, no LVO. MRI brain f/u today showing no acute infarct, chronic pontine infarct present.  ? ?The patient is seen in her room with no family at bedside while echo is being performed. She speaks exceedingly softly. Fully alert and oriented. She reports full compliance with ASA and her statin. States that she has continued LLE weakness.  ? ? ?Vitals:  ? 10/11/21 0600 10/11/21 0700 10/11/21 0730 10/11/21 0800  ?BP: 94/71 107/75 99/66 107/71  ?Pulse: (!) 59 (!) 56 60 64  ?Resp: '12 13 13 14  '$ ?Temp:      ?TempSrc:      ?SpO2: 93% 95% 94% 96%  ?Weight:      ?Height:      ? ?CBC:  ?Recent Labs  ?Lab 10/10/21 ?1434 10/10/21 ?1438  ?WBC 7.9  --   ?NEUTROABS 4.7  --   ?HGB 11.1* 12.6  ?HCT 35.7* 37.0  ?MCV 85.6  --   ?PLT 387  --   ? ?Basic Metabolic Panel:  ?Recent Labs  ?Lab 10/10/21 ?1434 10/10/21 ?1438  ?NA 139 139  ?K 3.4* 3.5  ?CL 103 104  ?CO2 23  --   ?GLUCOSE 152* 154*  ?BUN 21* 19  ?CREATININE 1.09* 1.20*  ?CALCIUM 9.9  --   ? ?Lipid Panel:  ?Recent Labs  ?Lab 10/11/21 ?0311  ?CHOL 136  ?TRIG 197*  ?HDL 26*  ?CHOLHDL 5.2  ?VLDL 39  ?[place] 71  ? ?HgbA1c:  ?Recent Labs  ?Lab 10/10/21 ?1921  ?HGBA1C 5.3  ? ?Urine Drug Screen:  ?Recent Labs  ?Lab 10/10/21 ?1730  ?LABOPIA NONE DETECTED  ?COCAINSCRNUR NONE DETECTED  ?LABBENZ POSITIVE*  ?AMPHETMU NONE DETECTED  ?THCU NONE DETECTED  ?LABBARB NONE DETECTED  ?  ?Alcohol Level  ?Recent Labs  ?Lab 10/10/21 ?1434  ?ETH <10  ? ? ?IMAGING past 24 hours ?CT HEAD CODE STROKE WO CONTRAST ? ?Result Date: 10/10/2021 ?CLINICAL DATA:  Code stroke.   Tingling on left side EXAM: CT HEAD WITHOUT CONTRAST TECHNIQUE: Contiguous axial images were obtained from the base of the skull through the vertex without intravenous contrast. RADIATION DOSE REDUCTION: This exam was performed according to the departmental dose-optimization program which includes automated exposure control, adjustment of the mA and/or kV according to patient size and/or use of iterative reconstruction technique. COMPARISON:  Brain MRI 12/22/2018 FINDINGS: Brain: There is no evidence of acute intracranial hemorrhage, extra-axial fluid collection, or acute infarct. Parenchymal volume is normal. The ventricles are normal in size. Gray-white differentiation is preserved. There is no mass lesion.  There is no mass effect or midline shift. Vascular: No hyperdense vessel or unexpected calcification. Skull: Normal. Negative for fracture or focal lesion. Sinuses/Orbits: Postsurgical changes are noted in the paranasal sinuses. There is mild mucosal thickening in the right maxillary sinus. The globes and orbits are unremarkable. Other: None. ASPECTS [facility] Stroke Program Early CT Score) - Ganglionic level infarction (caudate, lentiform nuclei, internal capsule, insula, M1-M3 cortex): 7 - Supraganglionic infarction (M4-M6 cortex): 3 Total score (0-10 with 10 being normal): 10 IMPRESSION: 1. No acute intracranial pathology. 2. ASPECTS is 10 Results  of the noncontrast head CT were called by telephone at the time of interpretation on 10/10/2021 at 2:33 pm to provider [facility] , who verbally acknowledged these results. Electronically Signed   By: Valetta Mole M.D.   On: 10/10/2021 14:42  ? ?CT ANGIO HEAD NECK W WO CM (CODE STROKE) ? ?Result Date: 10/10/2021 ?CLINICAL DATA:  Tingling on left side EXAM: CT ANGIOGRAPHY HEAD AND NECK TECHNIQUE: Multidetector CT imaging of the head and neck was performed using the standard protocol during bolus administration of intravenous contrast. Multiplanar CT image  reconstructions and MIPs were obtained to evaluate the vascular anatomy. Carotid stenosis measurements (when applicable) are obtained utilizing NASCET criteria, using the distal internal carotid diameter as the denominator. RADIATION DOSE REDUCTION: This exam was performed according to the departmental dose-optimization program which includes automated exposure control, adjustment of the mA and/or kV according to patient size and/or use of iterative reconstruction technique. CONTRAST:  88m OMNIPAQUE IOHEXOL 350 MG/ML SOLN COMPARISON:  Same-day noncontrast head CT, CTA head/neck 12/21/2018 FINDINGS: CTA NECK FINDINGS Aortic arch: Aortic arch is normal. The origins of the major branch vessels are patent. The subclavian arteries are patent to the level imaged. Right carotid system: The right common, internal, and external carotid arteries are patent, without hemodynamically significant stenosis or occlusion. There is no dissection or aneurysm. Left carotid system: Left common, internal, and external carotid arteries are patent, without hemodynamically significant stenosis or occlusion. There is no dissection or aneurysm. Vertebral arteries: The vertebral arteries are patent, without hemodynamically significant stenosis or occlusion. There is no dissection or aneurysm. Skeleton: There is no acute osseous abnormality or suspicious osseous lesion. There is no visible canal hematoma. Other neck: Soft tissues are unremarkable. Upper chest: Imaged lung apices are clear. Review of the MIP images confirms the above findings CTA HEAD FINDINGS Anterior circulation: The intracranial ICAs are patent with minimal calcified plaque but no hemodynamically significant stenosis. The bilateral MCAs are patent. The bilateral ACAs are patent. There is no aneurysm or AVM. Posterior circulation: The bilateral V4 segments are patent. The basilar artery is patent. There is focal moderate to severe stenosis of the distal right P2 segment  (7-110, 11-17), new since 2020. The PCAs are otherwise patent. The left posterior communicating artery is identified. The right posterior communicating artery is not definitely seen. There is no aneurysm or AVM. Venous sinuses: Patent. Anatomic variants: None. Review of the MIP images confirms the above findings IMPRESSION: 1. No emergent large vessel occlusion. 2. Moderate focal stenosis of the right P2 segment, new since 2020. Otherwise, patent vasculature of the head and neck. These findings were discussed with Dr. LCheral Markerat 3:31 p.m. Electronically Signed   By: PValetta MoleM.D.   On: 10/10/2021 15:39   ? ?PHYSICAL EXAM ? ?Physical Exam  ?Constitutional: Appears well-developed and well-nourished.  ?Psych: Affect appropriate to situation ?Eyes: No scleral injection ?HENT: No OP obstrucion ?MSK: no joint deformities.  ?Cardiovascular: Normal rate and regular rhythm.  ?Respiratory: Effort normal, non-labored breathing ?Skin: WDI ? ?Neuro: ?Mental Status: ?Patient is awake, alert, oriented to person, place, month, year, and situation. ?Patient is able to give a clear and coherent history. ?No signs of aphasia or neglect ?Cranial Nerves: ?II: Visual Fields are full. Pupils are equal, round, and reactive to light.   ?III,IV, VI: EOMI without ptosis or diploplia.  ?V: Facial sensation is symmetric to temperature ?VII: Facial movement is symmetric resting and smiling ?VIII: Hearing is intact to voice ?X: Palate elevates symmetrically ?  XI: Shoulder shrug is symmetric. ?XII: Tongue protrudes midline without atrophy or fasciculations.  ?Motor: ?Tone is normal. Bulk is normal. 4/5 strength in the LLE with weak dorsiflexion, otherwise 5/5 strength ?Sensory: ?Sensation is symmetric to light touch bilaterally  ?Cerebellar: ?FNF intact ? ? ?ASSESSMENT/PLAN ?Maria Lang is a 41 y.o. female past medical history of pontine stroke in 2020 without residual deficits, hypertension, hyperlipidemia, obesity, sleep apnea presented  to the ED at [facility] with acute onset of left-sided weakness and decreased level of consciousness. Initial NIHSS of 3. TNK given. Initial CTH negative. CTA with moderate focal stenosis of the R P2 segment

## 2021-10-11 NOTE — Evaluation (Signed)
Physical Therapy Evaluation and Discharge ?Patient Details ?Name: Maria Lang ?MRN: [id number] ?DOB: 26-Jan-1981 ?Today's Date: 10/11/2021 ? ?History of Present Illness ? Pt is 41 yo female who presents with L sided weakness, received TNK 4/17 1508. MRI neg for acute abnormalities. PMH: pontine CVA 2020 without residual deficts, obesity, sleep apnea, HTN, HLD, GERD, thyroid disease.  ?Clinical Impression ? Patient evaluated by Physical Therapy with no further acute PT needs identified. All education has been completed and the patient has no further questions. 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 Pt mildly unsteady with initial standing and favoring LLE but improved with distance and pt supervision level by the end of 400'.  Pt noted to have 3+/5 hip flex and 4/5 knee ext strength LLE on MMT compared to normal on R. She reports she did not have any PT after CVA in 2020. Would benefit from outpt PT upon d/c for strengthening LLE and mild balance deficits. See below for any follow-up Physical Therapy or equipment needs. PT is signing off. Thank you for this referral. ?   ?   ? ?Recommendations for follow up therapy are one component of a multi-disciplinary discharge planning process, led by the attending physician.  Recommendations may be updated based on patient status, additional functional criteria and insurance authorization. ? ?Follow Up Recommendations Outpatient PT ? ?  ?Assistance Recommended at Discharge Intermittent Supervision/Assistance  ?Patient can return home with the following ? A little help with walking and/or transfers;Help with stairs or ramp for entrance;Assistance with cooking/housework ? ?  ?Equipment Recommendations None recommended by PT  ?Recommendations for Other Services ?    ?  ?Functional Status Assessment Patient has had a recent decline in their functional status and demonstrates the ability to make significant improvements in function in a reasonable  and predictable amount of time.  ? ?  ?Precautions / Restrictions Precautions ?Precautions: Fall ?Restrictions ?Weight Bearing Restrictions: No  ? ?  ? ?Mobility ? Bed Mobility ?Overal bed mobility: Modified Independent ?  ?  ?  ?  ?  ?  ?General bed mobility comments: increased time and effort but no physical assist needed ?  ? ?Transfers ?Overall transfer level: Needs assistance ?Equipment used: None ?Transfers: Sit to/from Stand ?Sit to Stand: Supervision ?  ?  ?  ?  ?  ?General transfer comment: supervision for safety, no LOB or dizziness noted ?  ? ?Ambulation/Gait ?Ambulation/Gait assistance: Supervision ?Gait Distance (Feet): 400 Feet ?Assistive device: None ?Gait Pattern/deviations: Step-to pattern, Step-through pattern ?Gait velocity: decreased ?Gait velocity interpretation: >2.62 ft/sec, indicative of community ambulatory ?  ?General Gait Details: pt began with step to pattern, leading with LLE each time, minguard for safety. Became more stable and more reciprocal pattern with distance, progressed to supervision ? ?Stairs ?  ?  ?  ?  ?  ? ?Wheelchair Mobility ?  ? ?Modified Rankin (Stroke Patients Only) ?  ? ?  ? ?Balance Overall balance assessment: Mild deficits observed, not formally tested ?  ?  ?  ?  ?  ?  ?  ?  ?  ?  ?  ?  ?  ?  ?  ?  ?  ?  ?   ? ? ? ?Pertinent Vitals/Pain Pain Assessment ?Pain Assessment: No/denies pain  ? ? ?Home Living Family/patient expects to be discharged to:: Private residence ?Living Arrangements: Spouse/significant other ?Available Help at Discharge: Family;Available 24 hours/day ?Type of Home: House ?Home Access: Stairs to  enter ?Entrance Stairs-Rails: Right;Left;Can reach both ?Entrance Stairs-Number of Steps: 1 ?  ?Home Layout: One level ?Home Equipment: None ?Additional Comments: mom has cane she can borrow if needed. Mom and kids can be around when husband is working. 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  ?Prior Function Prior Level of Function :  Independent/Modified Independent;Working/employed;Driving ?  ?  ?  ?  ?  ?  ?  ?  ?  ? ? ?Hand Dominance  ? Dominant Hand: Left ? ?  ?Extremity/Trunk Assessment  ? Upper Extremity Assessment ?Upper Extremity Assessment: Defer to OT evaluation ?  ? ?Lower Extremity Assessment ?Lower Extremity Assessment: LLE deficits/detail ?LLE Deficits / Details: hip flex 3+/5, knee ext 4-/5, ankle WFL ?LLE Sensation: WNL ?LLE Coordination: WNL ?  ? ?Cervical / Trunk Assessment ?Cervical / Trunk Assessment: Normal  ?Communication  ? Communication: No difficulties  ?Cognition Arousal/Alertness: Awake/alert ?Behavior During Therapy: [facility] for tasks assessed/performed ?Overall Cognitive Status: Within Functional Limits for tasks assessed ?  ?  ?  ?  ?  ?  ?  ?  ?  ?  ?  ?  ?  ?  ?  ?  ?  ?  ?  ? ?  ?General Comments General comments (skin integrity, edema, etc.): VSS on RA. Husband present and supportive. Pt did not have any PT after CVA in 2020, discussed outpt PT for LLE strength and balance and pt agreeable ? ?  ?Exercises    ? ?Assessment/Plan  ?  ?PT Assessment All further PT needs can be met in the next venue of care  ?PT Problem List Decreased strength;Decreased balance;Decreased mobility ? ?   ?  ?PT Treatment Interventions     ? ?PT Goals (Current goals can be found in the Care Plan section)  ?Acute Rehab PT Goals ?Patient Stated Goal: return home ? ?  ?Frequency   ?  ? ? ?Co-evaluation PT/OT/SLP Co-Evaluation/Treatment: Yes ?Reason for Co-Treatment: Complexity of the patient's impairments (multi-system involvement);Necessary to address cognition/behavior during functional activity ?PT goals addressed during session: Mobility/safety with mobility;Balance ?  ?  ? ? ?  ?AM-PAC PT "6 Clicks" Mobility  ?Outcome Measure Help needed turning from your back to your side while in a flat bed without using bedrails?: None ?Help needed moving from lying on your back to sitting on the side of a flat bed without using bedrails?: None ?Help  needed moving to and from a bed to a chair (including a wheelchair)?: None ?Help needed standing up from a chair using your arms (e.g., wheelchair or bedside chair)?: None ?Help needed to walk in hospital room?: A Little ?Help needed climbing 3-5 steps with a railing? : A Little ?6 Click Score: 22 ? ?  ?End of Session Equipment Utilized During Treatment: Gait belt ?Activity Tolerance: Patient tolerated treatment well ?Patient left: in chair;with call bell/phone within reach;with family/visitor present ?Nurse Communication: Mobility status ?PT Visit Diagnosis: Unsteadiness on feet (R26.81) ?  ? ?Time: 0051-1021 ?PT Time Calculation (min) (ACUTE ONLY): 29 min ? ? ?Charges:   PT Evaluation ?$PT Eval Moderate Complexity: 1 Mod ?  ?  ?   ? ? ?Leighton Roach, PT  ?Acute Rehab Services ? Pager [phone] ?Office [phone] ? ? ?[place] ?10/11/2021, 3:30 PM ? ?

## 2021-10-11 NOTE — TOC CAGE-AID Note (Signed)
Transition of Care (TOC) - CAGE-AID Screening ? ? ?Patient Details  ?Name: Maria Lang ?MRN: [id number] ?Date of Birth: August 06, 1980 ? ?Transition of Care (TOC) CM/SW Contact:    ?Maria Lang, LCSWA ?Phone Number: ?10/11/2021, 1:51 PM ? ? ?Clinical Narrative: ?Pt participated in [place].  Per pts husband, pt does not use substance or ETOH.  Pt was not offered resources, due to no usage of substance or ETOH.   ? ?Passenger transport manager, MSW, LCSW-A ?Pronouns:  She/Her/Hers ?[facility] HealthTransitions of Care ?Clinical Social Worker ?Direct Number:  [phone] ?[email or web address] ? ? ?CAGE-AID Screening: ?  ? ?Have You Ever Felt You Ought to Cut Down on Your Drinking or Drug Use?: No ?Have People Annoyed You By Critizing Your Drinking Or Drug Use?: No ?Have You Felt Bad Or Guilty About Your Drinking Or Drug Use?: No ?Have You Ever Had a Drink or Used Drugs First Thing In The Morning to Steady Your Nerves or to Get Rid of a Hangover?: No ?CAGE-AID Score: 0 ? ?Substance Abuse Education Offered: No ? ?  ? ? ? ? ? ? ?

## 2021-10-11 NOTE — Evaluation (Signed)
Occupational Therapy Evaluation ?Patient Details ?Name: Maria Lang ?MRN: [id number] ?DOB: 1980-12-24 ?Today's Date: 10/11/2021 ? ? ?History of Present Illness Pt is 41 yo female who presents with L sided weakness, received TNK 4/17 1508. MRI neg for acute abnormalities. PMH: pontine CVA 2020 without residual deficts, obesity, sleep apnea, HTN, HLD, GERD, thyroid disease.  ? ?Clinical Impression ?  ?Maria Lang was evaluated s/p the above admission list, she is indep at baseline including all ADL/IADLs and mobility. Upon evaluation she demonstrated supervision- min G level functional mobility and ADLs including toilet transfer and hygiene. She is limited by LLE "heaviness" that causes her to have a limping gait and be mildly unsteady, however with increased activity she was noted to have improved gait and balance. Pt would benefit from OT acutely to progress towards indep baseline however she does not need post-acute OT. Recommend d/c to home with support of family.  ?   ? ?Recommendations for follow up therapy are one component of a multi-disciplinary discharge planning process, led by the attending physician.  Recommendations may be updated based on patient status, additional functional criteria and insurance authorization.  ? ?Follow Up Recommendations ? No OT follow up  ?  ?Assistance Recommended at Discharge Intermittent Supervision/Assistance  ?Patient can return home with the following A little help with walking and/or transfers;Assist for transportation ? ?  ?Functional Status Assessment ? Patient has had a recent decline in their functional status and demonstrates the ability to make significant improvements in function in a reasonable and predictable amount of time.  ?Equipment Recommendations ? None recommended by OT  ?  ?Recommendations for Other Services   ? ? ?  ?Precautions / Restrictions Precautions ?Precautions: Fall ?Restrictions ?Weight Bearing Restrictions: No  ? ?  ? ?Mobility Bed Mobility ?Overal  bed mobility: Modified Independent ?  ?  ?  ?  ?  ?  ?  ?  ? ?Transfers ?Overall transfer level: Needs assistance ?Equipment used: None ?Transfers: Sit to/from Stand ?Sit to Stand: Supervision ?  ?  ?  ?  ?  ?  ?  ? ?  ?Balance Overall balance assessment: Mild deficits observed, not formally tested ?  ?  ?  ?  ?  ?  ?  ?  ?  ?  ?  ?  ?  ?  ?  ?  ?  ?  ?   ? ?ADL either performed or assessed with clinical judgement  ? ?ADL Overall ADL's : Needs assistance/impaired ?Eating/Feeding: Independent;Sitting ?  ?Grooming: Supervision/safety;Standing ?  ?Upper Body Bathing: Set up;Sitting ?  ?Lower Body Bathing: Supervison/ safety;Sit to/from stand ?  ?Upper Body Dressing : Set up;Sitting ?  ?Lower Body Dressing: Supervision/safety;Sit to/from stand ?  ?Toilet Transfer: Supervision/safety;Ambulation ?  ?Toileting- Clothing Manipulation and Hygiene: Supervision/safety;Sitting/lateral lean ?  ?  ?  ?Functional mobility during ADLs: Supervision/safety ?General ADL Comments: min G initially for safety, progressed to supervision without AD. Pt with limping gait on LLE but overall WFL.  ? ? ? ?Vision Baseline Vision/History: 0 No visual deficits ?Vision Assessment?: No apparent visual deficits  ?   ?Perception   ?  ?Praxis   ?  ? ?Pertinent Vitals/Pain Pain Assessment ?Pain Assessment: No/denies pain  ? ? ? ?Hand Dominance Left ?  ?Extremity/Trunk Assessment Upper Extremity Assessment ?Upper Extremity Assessment: Overall WFL for tasks assessed ?  ?Lower Extremity Assessment ?Lower Extremity Assessment: Defer to PT evaluation ?LLE Deficits / Details: hip flex 3+/5, knee ext 4-/5, ankle WFL ?LLE Sensation:  WNL ?LLE Coordination: WNL ?  ?Cervical / Trunk Assessment ?Cervical / Trunk Assessment: Normal ?  ?Communication Communication ?Communication: No difficulties ?  ?Cognition Arousal/Alertness: Awake/alert ?Behavior During Therapy: [facility] for tasks assessed/performed ?Overall Cognitive Status: Within Functional Limits for tasks  assessed ?  ?  ?  ?  ?  ?  ?  ?  ?  ?  ?  ?  ?  ?  ?  ?  ?  ?  ?  ?General Comments  VSS on RA, husband present and supportive ? ?  ?Exercises   ?  ?Shoulder Instructions    ? ? ?Home Living Family/patient expects to be discharged to:: Private residence ?Living Arrangements: Spouse/significant other ?Available Help at Discharge: Family;Available 24 hours/day ?Type of Home: House ?Home Access: Stairs to enter ?Entrance Stairs-Number of Steps: 1 ?Entrance Stairs-Rails: Right;Left;Can reach both ?Home Layout: One level ?  ?  ?Bathroom Shower/Tub: Walk-in shower ?  ?Bathroom Toilet: Standard ?  ?  ?Home Equipment: None ?  ?Additional Comments: mom has cane she can borrow if needed. Mom and kids can be around when husband is working. 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 Lives With: Spouse ? ?  ?Prior Functioning/Environment Prior Level of Function : Independent/Modified Independent;Working/employed;Driving ?  ?  ?  ?  ?  ?  ?  ?  ?  ? ?  ?  ?OT Problem List: Decreased activity tolerance;Impaired balance (sitting and/or standing) ?  ?   ?OT Treatment/Interventions: Therapeutic exercise;Self-care/ADL training;Balance training;Therapeutic activities;DME and/or AE instruction  ?  ?OT Goals(Current goals can be found in the care plan section) Acute Rehab OT Goals ?Patient Stated Goal: home ?OT Goal Formulation: With patient ?Time For Goal Achievement: 10/25/21 ?Potential to Achieve Goals: Good ?ADL Goals ?Additional ADL Goal #1: Pt will indep complete BADLs  ?OT Frequency: Min 2X/week ?  ? ?Co-evaluation PT/OT/SLP Co-Evaluation/Treatment: Yes ?Reason for Co-Treatment: Complexity of the patient's impairments (multi-system involvement) ?PT goals addressed during session: Mobility/safety with mobility;Balance ?OT goals addressed during session: ADL's and self-care ?  ? ?  ?AM-PAC OT "6 Clicks" Daily Activity     ?Outcome Measure Help from another person eating meals?: None ?Help from another person taking care of personal  grooming?: None ?Help from another person toileting, which includes using toliet, bedpan, or urinal?: A Little ?Help from another person bathing (including washing, rinsing, drying)?: A Little ?Help from another person to put on and taking off regular upper body clothing?: None ?Help from another person to put on and taking off regular lower body clothing?: A Little ?6 Click Score: 21 ?  ?End of Session Equipment Utilized During Treatment: Gait belt ?Nurse Communication: Mobility status ? ?Activity Tolerance: Patient tolerated treatment well ?Patient left: in chair;with call bell/phone within reach;with family/visitor present ? ?OT Visit Diagnosis: Other abnormalities of gait and mobility (R26.89)  ?              ?Time: 3734-2876 ?OT Time Calculation (min): 29 min ?Charges:  OT General Charges ?$OT Visit: 1 Visit ?OT Evaluation ?$OT Eval Moderate Complexity: 1 Mod ? ? ?Donney Caraveo A Serai Tukes ?10/11/2021, 4:42 PM ?

## 2021-10-12 ENCOUNTER — Inpatient Hospital Stay (HOSPITAL_COMMUNITY): Payer: BC Managed Care – PPO

## 2021-10-12 ENCOUNTER — Other Ambulatory Visit: Payer: Self-pay | Admitting: Cardiology

## 2021-10-12 ENCOUNTER — Other Ambulatory Visit: Payer: Self-pay | Admitting: Neurology

## 2021-10-12 ENCOUNTER — Encounter: Payer: Self-pay | Admitting: Nurse Practitioner

## 2021-10-12 DIAGNOSIS — I639 Cerebral infarction, unspecified: Secondary | ICD-10-CM

## 2021-10-12 DIAGNOSIS — G459 Transient cerebral ischemic attack, unspecified: Secondary | ICD-10-CM

## 2021-10-12 DIAGNOSIS — Z8673 Personal history of transient ischemic attack (TIA), and cerebral infarction without residual deficits: Secondary | ICD-10-CM

## 2021-10-12 LAB — HOMOCYSTEINE: Homocysteine: 10.4 umol/L (ref 0.0–14.5)

## 2021-10-12 LAB — CBC
HCT: 30.9 % — ABNORMAL LOW (ref 36.0–46.0)
Hemoglobin: 9.3 g/dL — ABNORMAL LOW (ref 12.0–15.0)
MCH: 26.3 pg (ref 26.0–34.0)
MCHC: 30.1 g/dL (ref 30.0–36.0)
MCV: 87.3 fL (ref 80.0–100.0)
Platelets: 264 10*3/uL (ref 150–400)
RBC: 3.54 MIL/uL — ABNORMAL LOW (ref 3.87–5.11)
RDW: 13.9 % (ref 11.5–15.5)
WBC: 6 10*3/uL (ref 4.0–10.5)
nRBC: 0 % (ref 0.0–0.2)

## 2021-10-12 LAB — BASIC METABOLIC PANEL
Anion gap: 8 (ref 5–15)
BUN: 9 mg/dL (ref 6–20)
CO2: 23 mmol/L (ref 22–32)
Calcium: 8.9 mg/dL (ref 8.9–10.3)
Chloride: 106 mmol/L (ref 98–111)
Creatinine, Ser: 0.82 mg/dL (ref 0.44–1.00)
GFR, Estimated: >60 mL/min (ref >60)
Glucose, Bld: 98 mg/dL (ref 70–99)
Potassium: 3.4 mmol/L — ABNORMAL LOW (ref 3.5–5.1)
Sodium: 137 mmol/L (ref 135–145)

## 2021-10-12 MED ORDER — ASPIRIN 81 MG PO TBEC: 81.0000 mg | DELAYED_RELEASE_TABLET | Freq: Every day | ORAL | 11 refills | Status: DC

## 2021-10-12 MED ORDER — ATORVASTATIN CALCIUM 40 MG PO TABS
40.0000 mg | ORAL_TABLET | Freq: Every day | ORAL | 1 refills | Status: DC
Start: 2021-10-13 — End: 2022-02-07

## 2021-10-12 MED ORDER — POTASSIUM CHLORIDE CRYS ER 20 MEQ PO TBCR
20.0000 meq | EXTENDED_RELEASE_TABLET | Freq: Once | ORAL | Status: AC
  Administered 2021-10-12: 20 meq via ORAL
  Filled 2021-10-12: qty 1

## 2021-10-12 MED ORDER — CLOPIDOGREL BISULFATE 75 MG PO TABS: 75.0000 mg | ORAL_TABLET | Freq: Every day | ORAL | 1 refills | Status: DC

## 2021-10-12 NOTE — Discharge Instructions (Addendum)
Maria Lang, you were admitted with left arm and leg weakness and were given TNK to treat a possible stroke.  Your MRI reveals no stroke, so your stroke was likely reversed by the TNK.  After discharge, you will need to take aspirin and Plavix for 3 weeks followed by Plavix alone indefinitely.  You will need to follow up in the stroke clinic in 6-8 weeks. ?

## 2021-10-12 NOTE — Progress Notes (Signed)
Bilateral lower extremity venous duplex completed. ?Refer to "CV Proc" under chart review to view preliminary results. ? ?10/12/2021 11:21 AM ?Kelby Aline., MHA, RVT, RDCS, RDMS   ?

## 2021-10-12 NOTE — Discharge Summary (Addendum)
Stroke Discharge Summary  Patient ID: Maria Lang   MRN: [id number]      DOB: April 08, 1981  Date of Admission: 10/10/2021 Date of Discharge: 10/12/2021  Attending Physician:  Stroke, Md, MD, Stroke MD Consultant(s):    None  Patient's PCP:  Lupe Carney, PA-C  DISCHARGE DIAGNOSIS: TIA with left sided weakness s/p TNK Principal Problem:   Acute stroke due to ischemia [facility]) Active Problems:   Acute ischemic stroke (HCC)   Allergies as of 10/12/2021       Reactions   Hydrocodeine [dihydrocodeine] Shortness Of Breath, Nausea And Vomiting   Hydrocodone-acetaminophen Shortness Of Breath, Nausea Only   Coreg [carvedilol]    Headache   Labetalol Other (See Comments)   Head itches        Medication List     TAKE these medications    ALPRAZolam 1 MG tablet Commonly known as: XANAX Take 0.5-1 tablets by mouth 3 (three) times daily as needed for anxiety.   aspirin 81 MG EC tablet Take 1 tablet (81 mg total) by mouth daily. Swallow whole. Start taking on: October 13, 2021   atorvastatin 40 MG tablet Commonly known as: LIPITOR Take 1 tablet (40 mg total) by mouth daily. Start taking on: October 13, 2021   clopidogrel 75 MG tablet Commonly known as: PLAVIX Take 1 tablet (75 mg total) by mouth daily. Start taking on: October 13, 2021   furosemide 20 MG tablet Commonly known as: LASIX Take 1 tablet (20 mg total) by mouth daily as needed for edema.   levothyroxine 75 MCG tablet Commonly known as: SYNTHROID Take 75 mcg by mouth daily before breakfast.   ondansetron 4 MG disintegrating tablet Commonly known as: Zofran ODT Take 1 tablet (4 mg total) by mouth every 6 (six) hours as needed for nausea or vomiting.   pantoprazole 40 MG tablet Commonly known as: PROTONIX TAKE 1 TABLET BY MOUTH TWICE DAILY BEFORE BREAKFAST AND DINNER What changed: See the new instructions.   sertraline 100 MG tablet Commonly known as: ZOLOFT Take 150 mg by mouth daily.         LABORATORY STUDIES CBC    Component Value Date/Time   WBC 6.0 10/12/2021 0424   RBC 3.54 (L) 10/12/2021 0424   HGB 9.3 (L) 10/12/2021 0424   HCT 30.9 (L) 10/12/2021 0424   PLT 264 10/12/2021 0424   MCV 87.3 10/12/2021 0424   MCH 26.3 10/12/2021 0424   MCHC 30.1 10/12/2021 0424   RDW 13.9 10/12/2021 0424   LYMPHSABS 2.7 10/10/2021 1434   MONOABS 0.5 10/10/2021 1434   EOSABS 0.0 10/10/2021 1434   BASOSABS 0.0 10/10/2021 1434   CMP    Component Value Date/Time   NA 137 10/12/2021 0424   K 3.4 (L) 10/12/2021 0424   CL 106 10/12/2021 0424   CO2 23 10/12/2021 0424   GLUCOSE 98 10/12/2021 0424   BUN 9 10/12/2021 0424   CREATININE 0.82 10/12/2021 0424   CALCIUM 8.9 10/12/2021 0424   PROT 8.3 (H) 10/10/2021 1434   ALBUMIN 4.7 10/10/2021 1434   AST 17 10/10/2021 1434   ALT 21 10/10/2021 1434   ALKPHOS 73 10/10/2021 1434   BILITOT 0.6 10/10/2021 1434   GFRNONAA >60 10/12/2021 0424   GFRAA >60 12/23/2018 0752   COAGS Lab Results  Component Value Date   INR 1.0 10/10/2021   INR 1.0 12/21/2018   INR 1.13 12/10/2014   Lipid Panel    Component Value Date/Time   CHOL  136 10/11/2021 0311   TRIG 197 (H) 10/11/2021 0311   HDL 26 (L) 10/11/2021 0311   CHOLHDL 5.2 10/11/2021 0311   VLDL 39 10/11/2021 0311   LDLCALC 71 10/11/2021 0311   HgbA1C  Lab Results  Component Value Date   HGBA1C 5.3 10/10/2021   Urinalysis    Component Value Date/Time   COLORURINE YELLOW 10/10/2021 1730   APPEARANCEUR CLOUDY (A) 10/10/2021 1730   LABSPEC 1.044 (H) 10/10/2021 1730   PHURINE 6.0 10/10/2021 1730   GLUCOSEU NEGATIVE 10/10/2021 1730   HGBUR MODERATE (A) 10/10/2021 1730   BILIRUBINUR NEGATIVE 10/10/2021 1730   BILIRUBINUR negative 09/05/2021 1024   KETONESUR NEGATIVE 10/10/2021 1730   PROTEINUR 30 (A) 10/10/2021 1730   UROBILINOGEN 0.2 09/05/2021 1024   NITRITE NEGATIVE 10/10/2021 1730   LEUKOCYTESUR LARGE (A) 10/10/2021 1730   Urine Drug Screen     Component Value  Date/Time   LABOPIA NONE DETECTED 10/10/2021 1730   COCAINSCRNUR NONE DETECTED 10/10/2021 1730   LABBENZ POSITIVE (A) 10/10/2021 1730   AMPHETMU NONE DETECTED 10/10/2021 1730   THCU NONE DETECTED 10/10/2021 1730   LABBARB NONE DETECTED 10/10/2021 1730    Alcohol Level    Component Value Date/Time   ETH <10 10/10/2021 1434     SIGNIFICANT DIAGNOSTIC STUDIES MR BRAIN WO CONTRAST  Result Date: 10/11/2021 CLINICAL DATA:  Neuro deficit, acute, stroke suspected. Acute left-sided weakness. History of stroke. EXAM: MRI HEAD WITHOUT CONTRAST TECHNIQUE: Multiplanar, multiecho pulse sequences of the brain and surrounding structures were obtained without intravenous contrast. COMPARISON:  Head CT and CTA 10/10/2021.  Head MRI 12/22/2018. FINDINGS: Brain: There is no evidence of an acute infarct, intracranial hemorrhage, mass, midline shift, or extra-axial fluid collection. A small chronic right pontine infarct is noted (acute on the prior MRI). The ventricles and sulci are normal. A few small T2 hyperintensities are noted in the cerebral white matter, nonspecific but may reflect early chronic small vessel ischemia given the patient's vascular risk factors. Vascular: Major intracranial vascular flow voids are preserved. Skull and upper cervical spine: Unremarkable bone marrow signal. Sinuses/Orbits: Unremarkable orbits. Mild mucosal thickening in the paranasal sinuses. Prior functional endoscopic sinus surgery. No significant mastoid fluid. Other: None. IMPRESSION: 1. No acute intracranial abnormality. 2. Chronic pontine infarct. Electronically Signed   By: Sebastian Ache M.D.   On: 10/11/2021 13:41   ECHOCARDIOGRAM COMPLETE  Result Date: 10/11/2021    ECHOCARDIOGRAM REPORT   Patient Name:   Maria Lang Date of Exam: 10/11/2021 Medical Rec #:  [id number]       Height:       67.0 in Accession #:    [id number]      Weight:       186.7 lb Date of Birth:  29-Sep-1980       BSA:          1.964 m Patient Age:     41 years        BP:           107/71 mmHg Patient Gender: F               HR:           60 bpm. Exam Location:  Inpatient Procedure: 2D Echo, Cardiac Doppler, Color Doppler and Strain Analysis Indications:    CVA  History:        Patient has prior history of Echocardiogram examinations, most  recent 12/10/2014. Stroke; Risk Factors:Dyslipidemia and                 Hypertension.  Sonographer:    Neomia Dear RDCS Referring Phys: [id number] ASHISH ARORA IMPRESSIONS  1. Left ventricular ejection fraction, by estimation, is 60 to 65%. The left ventricle has normal function. The left ventricle has no regional wall motion abnormalities. There is moderate concentric left ventricular hypertrophy. Left ventricular diastolic parameters are consistent with Grade I diastolic dysfunction (impaired relaxation).  2. Right ventricular systolic function is normal. The right ventricular size is normal. Tricuspid regurgitation signal is inadequate for assessing PA pressure.  3. The mitral valve is grossly normal. No evidence of mitral valve regurgitation. No evidence of mitral stenosis.  4. The aortic valve is tricuspid. Aortic valve regurgitation is not visualized. No aortic stenosis is present.  5. The inferior vena cava is normal in size with greater than 50% respiratory variability, suggesting right atrial pressure of 3 mmHg. Conclusion(s)/Recommendation(s): Normal biventricular function without evidence of hemodynamically significant valvular heart disease. No intracardiac source of embolism detected on this transthoracic study. Consider a transesophageal echocardiogram to exclude cardiac source of embolism if clinically indicated. FINDINGS  Left Ventricle: Left ventricular ejection fraction, by estimation, is 60 to 65%. The left ventricle has normal function. The left ventricle has no regional wall motion abnormalities. Global longitudinal strain performed but not reported based on interpreter judgement due to  suboptimal tracking. The left ventricular internal cavity size was normal in size. There is moderate concentric left ventricular hypertrophy. Left ventricular diastolic parameters are consistent with Grade I diastolic dysfunction (impaired relaxation). Right Ventricle: The right ventricular size is normal. No increase in right ventricular wall thickness. Right ventricular systolic function is normal. Tricuspid regurgitation signal is inadequate for assessing PA pressure. Left Atrium: Left atrial size was normal in size. Right Atrium: Right atrial size was normal in size. Pericardium: Trivial pericardial effusion is present. Mitral Valve: The mitral valve is grossly normal. No evidence of mitral valve regurgitation. No evidence of mitral valve stenosis. Tricuspid Valve: The tricuspid valve is grossly normal. Tricuspid valve regurgitation is not demonstrated. No evidence of tricuspid stenosis. Aortic Valve: The aortic valve is tricuspid. Aortic valve regurgitation is not visualized. No aortic stenosis is present. Aortic valve mean gradient measures 3.0 mmHg. Aortic valve peak gradient measures 6.0 mmHg. Aortic valve area, by VTI measures 2.80 cm. Pulmonic Valve: The pulmonic valve was grossly normal. Pulmonic valve regurgitation is not visualized. No evidence of pulmonic stenosis. Aorta: The aortic root and ascending aorta are structurally normal, with no evidence of dilitation. Venous: The inferior vena cava is normal in size with greater than 50% respiratory variability, suggesting right atrial pressure of 3 mmHg. IAS/Shunts: The atrial septum is grossly normal.  LEFT VENTRICLE PLAX 2D LVIDd:         3.30 cm     Diastology LVIDs:         2.30 cm     LV e' medial:    6.20 cm/s LV PW:         1.20 cm     LV E/e' medial:  8.8 LV IVS:        1.30 cm     LV e' lateral:   8.59 cm/s LVOT diam:     2.00 cm     LV E/e' lateral: 6.4 LV SV:         79 LV SV Index:   40 LVOT Area:     3.14  cm  LV Volumes (MOD) LV vol d, MOD  A2C: 53.8 ml LV vol d, MOD A4C: 64.6 ml LV vol s, MOD A2C: 15.0 ml LV vol s, MOD A4C: 20.3 ml LV SV MOD A2C:     38.8 ml LV SV MOD A4C:     64.6 ml LV SV MOD BP:      42.0 ml RIGHT VENTRICLE RV Basal diam:  2.50 cm RV Mid diam:    1.80 cm RV S prime:     10.80 cm/s TAPSE (M-mode): 2.1 cm LEFT ATRIUM             Index        RIGHT ATRIUM          Index LA diam:        2.90 cm 1.48 cm/m   RA Area:     8.06 cm LA Vol (A2C):   35.9 ml 18.28 ml/m  RA Volume:   12.30 ml 6.26 ml/m LA Vol (A4C):   33.3 ml 16.95 ml/m LA Biplane Vol: 36.8 ml 18.74 ml/m  AORTIC VALVE                    PULMONIC VALVE AV Area (Vmax):    2.65 cm     PV Vmax:       0.72 m/s AV Area (Vmean):   2.63 cm     PV Vmean:      55.700 cm/s AV Area (VTI):     2.80 cm     PV VTI:        0.207 m AV Vmax:           122.00 cm/s  PV Peak grad:  2.0 mmHg AV Vmean:          83.500 cm/s  PV Mean grad:  1.0 mmHg AV VTI:            0.281 m AV Peak Grad:      6.0 mmHg AV Mean Grad:      3.0 mmHg LVOT Vmax:         103.00 cm/s LVOT Vmean:        69.800 cm/s LVOT VTI:          0.250 m LVOT/AV VTI ratio: 0.89  AORTA Ao Root diam: 3.10 cm Ao Asc diam:  3.20 cm MITRAL VALVE MV Area (PHT): 2.95 cm    SHUNTS MV Decel Time: 257 msec    Systemic VTI:  0.25 m MV E velocity: 54.80 cm/s  Systemic Diam: 2.00 cm MV A velocity: 51.80 cm/s MV E/A ratio:  1.06 Lennie Odor MD Electronically signed by Lennie Odor MD Signature Date/Time: 10/11/2021/12:35:44 PM    Final    CT HEAD CODE STROKE WO CONTRAST  Result Date: 10/10/2021 CLINICAL DATA:  Code stroke.  Tingling on left side EXAM: CT HEAD WITHOUT CONTRAST TECHNIQUE: Contiguous axial images were obtained from the base of the skull through the vertex without intravenous contrast. RADIATION DOSE REDUCTION: This exam was performed according to the departmental dose-optimization program which includes automated exposure control, adjustment of the mA and/or kV according to patient size and/or use of iterative  reconstruction technique. COMPARISON:  Brain MRI 12/22/2018 FINDINGS: Brain: There is no evidence of acute intracranial hemorrhage, extra-axial fluid collection, or acute infarct. Parenchymal volume is normal. The ventricles are normal in size. Gray-white differentiation is preserved. There is no mass lesion.  There is no mass effect or midline shift. Vascular: No hyperdense vessel or  unexpected calcification. Skull: Normal. Negative for fracture or focal lesion. Sinuses/Orbits: Postsurgical changes are noted in the paranasal sinuses. There is mild mucosal thickening in the right maxillary sinus. The globes and orbits are unremarkable. Other: None. ASPECTS [facility] Stroke Program Early CT Score) - Ganglionic level infarction (caudate, lentiform nuclei, internal capsule, insula, M1-M3 cortex): 7 - Supraganglionic infarction (M4-M6 cortex): 3 Total score (0-10 with 10 being normal): 10 IMPRESSION: 1. No acute intracranial pathology. 2. ASPECTS is 10 Results of the noncontrast head CT were called by telephone at the time of interpretation on 10/10/2021 at 2:33 pm to provider [facility] , who verbally acknowledged these results. Electronically Signed   By: Lesia Hausen M.D.   On: 10/10/2021 14:42   VAS [place] LOWER EXTREMITY VENOUS (DVT)  Result Date: 10/12/2021  Lower Venous DVT Study Patient Name:  Maria Lang  Date of Exam:   10/12/2021 Medical Rec #: [id number]        Accession #:    [id number] Date of Birth: 1981/04/22        Patient Gender: F Patient Age:   34 years Exam Location:  [facility] Procedure:      VAS [place] LOWER EXTREMITY VENOUS (DVT) Referring Phys: Dewitt Hoes DE LA TORRE --------------------------------------------------------------------------------  Indications: Stroke.  Comparison Study: 12/22/2018 negative lower extremity venous duplex Performing Technologist: Gertie Fey MHA, RDMS, RVT, RDCS  Examination Guidelines: A complete evaluation includes B-mode imaging, spectral Doppler,  color Doppler, and power Doppler as needed of all accessible portions of each vessel. Bilateral testing is considered an integral part of a complete examination. Limited examinations for reoccurring indications may be performed as noted. The reflux portion of the exam is performed with the patient in reverse Trendelenburg.  +---------+---------------+---------+-----------+----------+--------------+ RIGHT    CompressibilityPhasicitySpontaneityPropertiesThrombus Aging +---------+---------------+---------+-----------+----------+--------------+ CFV      Full           Yes      Yes                                 +---------+---------------+---------+-----------+----------+--------------+ SFJ      Full                                                        +---------+---------------+---------+-----------+----------+--------------+ FV Prox  Full                                                        +---------+---------------+---------+-----------+----------+--------------+ FV Mid   Full                                                        +---------+---------------+---------+-----------+----------+--------------+ FV DistalFull                                                        +---------+---------------+---------+-----------+----------+--------------+  PFV      Full                                                        +---------+---------------+---------+-----------+----------+--------------+ POP      Full           Yes      Yes                                 +---------+---------------+---------+-----------+----------+--------------+ PTV      Full                                                        +---------+---------------+---------+-----------+----------+--------------+ PERO     Full                                                        +---------+---------------+---------+-----------+----------+--------------+    +---------+---------------+---------+-----------+----------+--------------+ LEFT     CompressibilityPhasicitySpontaneityPropertiesThrombus Aging +---------+---------------+---------+-----------+----------+--------------+ CFV      Full           Yes      Yes                                 +---------+---------------+---------+-----------+----------+--------------+ SFJ      Full                                                        +---------+---------------+---------+-----------+----------+--------------+ FV Prox  Full                                                        +---------+---------------+---------+-----------+----------+--------------+ FV Mid   Full                                                        +---------+---------------+---------+-----------+----------+--------------+ FV DistalFull                                                        +---------+---------------+---------+-----------+----------+--------------+ PFV      Full                                                        +---------+---------------+---------+-----------+----------+--------------+  POP      Full           Yes      Yes                                 +---------+---------------+---------+-----------+----------+--------------+ PTV      Full                                                        +---------+---------------+---------+-----------+----------+--------------+ PERO     Full                                                        +---------+---------------+---------+-----------+----------+--------------+     Summary: RIGHT: - There is no evidence of deep vein thrombosis in the lower extremity.  - No cystic structure found in the popliteal fossa.  LEFT: - There is no evidence of deep vein thrombosis in the lower extremity.  - No cystic structure found in the popliteal fossa.  *See table(s) above for measurements and observations.    Preliminary    CT  ANGIO HEAD NECK W WO CM (CODE STROKE)  Result Date: 10/10/2021 CLINICAL DATA:  Tingling on left side EXAM: CT ANGIOGRAPHY HEAD AND NECK TECHNIQUE: Multidetector CT imaging of the head and neck was performed using the standard protocol during bolus administration of intravenous contrast. Multiplanar CT image reconstructions and MIPs were obtained to evaluate the vascular anatomy. Carotid stenosis measurements (when applicable) are obtained utilizing NASCET criteria, using the distal internal carotid diameter as the denominator. RADIATION DOSE REDUCTION: This exam was performed according to the departmental dose-optimization program which includes automated exposure control, adjustment of the mA and/or kV according to patient size and/or use of iterative reconstruction technique. CONTRAST:  75mL OMNIPAQUE IOHEXOL 350 MG/ML SOLN COMPARISON:  Same-day noncontrast head CT, CTA head/neck 12/21/2018 FINDINGS: CTA NECK FINDINGS Aortic arch: Aortic arch is normal. The origins of the major branch vessels are patent. The subclavian arteries are patent to the level imaged. Right carotid system: The right common, internal, and external carotid arteries are patent, without hemodynamically significant stenosis or occlusion. There is no dissection or aneurysm. Left carotid system: Left common, internal, and external carotid arteries are patent, without hemodynamically significant stenosis or occlusion. There is no dissection or aneurysm. Vertebral arteries: The vertebral arteries are patent, without hemodynamically significant stenosis or occlusion. There is no dissection or aneurysm. Skeleton: There is no acute osseous abnormality or suspicious osseous lesion. There is no visible canal hematoma. Other neck: Soft tissues are unremarkable. Upper chest: Imaged lung apices are clear. Review of the MIP images confirms the above findings CTA HEAD FINDINGS Anterior circulation: The intracranial ICAs are patent with minimal calcified  plaque but no hemodynamically significant stenosis. The bilateral MCAs are patent. The bilateral ACAs are patent. There is no aneurysm or AVM. Posterior circulation: The bilateral V4 segments are patent. The basilar artery is patent. There is focal moderate to severe stenosis of the distal right P2 segment (7-110, 11-17), new since 2020. The PCAs are otherwise patent. The left posterior communicating artery is identified. The right posterior communicating  artery is not definitely seen. There is no aneurysm or AVM. Venous sinuses: Patent. Anatomic variants: None. Review of the MIP images confirms the above findings IMPRESSION: 1. No emergent large vessel occlusion. 2. Moderate focal stenosis of the right P2 segment, new since 2020. Otherwise, patent vasculature of the head and neck. These findings were discussed with Dr. Otelia Limes at 3:31 p.m. Electronically Signed   By: Lesia Hausen M.D.   On: 10/10/2021 15:39      HISTORY OF PRESENT ILLNESS Patient with a history of pontine stroke in 2020, hypothyroidism, HTN, HLD, obesity and OSA presented with acute onset left-sided weakness.  HOSPITAL COURSE Patient was evaluated in the ED and given TNK to treat her possible stroke.  Symptoms resolved, and MRI revealed no acute infarct.  Patient's symptoms were most likely caused by a TIA.  RN Pressure Injury Documentation:     DISCHARGE EXAM Blood pressure 110/86, pulse 92, temperature 98 F (36.7 C), temperature source Oral, resp. rate 15, height 5\' 7"  (1.702 m), weight 84.7 kg, last menstrual period 10/03/2021, SpO2 95 %. General:  Alert, obese, well-developed patient in no acute distress Respiratory: Regular, unlabored respirations on room air  NEURO:  Mental Status: AA&Ox3  Speech/Language: speech is without dysarthria or aphasia.    Cranial Nerves:  II: PERRL. Visual fields full.  III, IV, VI: EOMI. Eyelids elevate symmetrically.  V: Sensation is intact to light touch and symmetrical to face.  VII:  Smile is symmetrical.  VIII: hearing intact to voice. IX, X: Phonation is normal.  XII: tongue is midline without fasciculations. Motor: 5/5 strength to all muscle groups tested.  Tone: is normal and bulk is normal Sensation- Intact to light touch bilaterally.   Coordination: FTN intact bilaterally Gait- deferred    Discharge Diet       Diet   Diet regular Room service appropriate? Yes; Fluid consistency: Thin   liquids  DISCHARGE PLAN Disposition:  home aspirin 81 mg daily and clopidogrel 75 mg daily for secondary stroke prevention for 3 weeks then Plavix alone. Ongoing stroke risk factor control by Primary Care Physician at time of discharge Follow-up PCP Laberge, Meagan, PA-C in 2 weeks. Follow-up in [facility] in 4 weeks, office to schedule an appointment.   35 minutes were spent preparing discharge.  Cortney E Ernestina Columbia , MSN, AGACNP-BC [facility] See Amion for schedule and pager information 10/12/2021 1:10 PM  ATTENDING NOTE: I reviewed above note and agree with the assessment and plan. Pt was seen and examined.   41 year old female with history of hypertension, hyperlipidemia, former smoker, OSA, prior stroke admitted for left-sided weakness, decreased level of consciousness.  CT no acute abnormality.  Status post TNK.  CTA head and neck showed right P2 moderate stenosis.  MRI no acute infarct.  EF 60 to 65%, LDL 71, A1c 5.3.  DVT negative.  Hypercoag work-up pending.  Creatinine 1.20, ESR 30, UA WBC 21-50.  Patient had stroke in 11/2018 with right pontine infarct.  CT head and neck unremarkable.  EF 60 to 65%.  DVT negative.  LDL 146, A1c 5.3.  Patient discharged with DAPT and Lipitor 40.  No residual.  On exam, patient neurologically intact, no deficit.  Patient symptoms could be TIA versus aborted stroke after TNK.  Etiology not quite clear, recommend 30-day CardioNet monitoring as outpatient to rule out A-fib.  Continue  aspirin Plavix DAPT for 3 weeks and then Plavix alone.  Continue Lipitor 40.  Finished Rocephin  course today, will be discharged in good condition.  Follow-up at [facility] in 4 weeks with stroke NP [facility].  For detailed assessment and plan, please refer to above as I have made changes wherever appropriate.   Marvel Plan, MD PhD Stroke Neurology 10/12/2021 11:43 PM

## 2021-10-12 NOTE — Progress Notes (Signed)
? ? ?  [facility] asked to place cardiac monitor in the setting of stroke. Dr. Harl Bowie to read.  ?

## 2021-10-13 LAB — BETA-2-GLYCOPROTEIN I ABS, IGG/M/A
Beta-2 Glyco I IgG: <9 GPI IgG units (ref 0–20)
Beta-2-Glycoprotein I IgA: <9 GPI IgA units (ref 0–25)
Beta-2-Glycoprotein I IgM: <9 GPI IgM units (ref 0–32)

## 2021-10-13 LAB — LUPUS ANTICOAGULANT PANEL
DRVVT: 46.5 s (ref 0.0–47.0)
PTT Lupus Anticoagulant: 34 s (ref 0.0–43.5)

## 2021-10-13 LAB — CARDIOLIPIN ANTIBODIES, IGG, IGM, IGA
Anticardiolipin IgA: <9 APL U/mL (ref 0–11)
Anticardiolipin IgG: <9 GPL U/mL (ref 0–14)
Anticardiolipin IgM: <9 MPL U/mL (ref 0–12)

## 2021-10-13 LAB — PROTEIN S, TOTAL: Protein S Ag, Total: 119 % (ref 60–150)

## 2021-10-13 LAB — PROTEIN C ACTIVITY: Protein C Activity: 164 % (ref 73–180)

## 2021-10-13 LAB — PROTEIN S ACTIVITY: Protein S Activity: 106 % (ref 63–140)

## 2021-10-13 LAB — PROTEIN C, TOTAL: Protein C, Total: 117 % (ref 60–150)

## 2021-10-17 ENCOUNTER — Telehealth: Payer: Self-pay | Admitting: Adult Health

## 2021-10-17 ENCOUNTER — Telehealth: Payer: Self-pay

## 2021-10-17 LAB — PROTHROMBIN GENE MUTATION

## 2021-10-17 LAB — FACTOR 5 LEIDEN

## 2021-10-17 NOTE — Telephone Encounter (Signed)
Last visit was in 2020, would need to see pt in the office before we could advise on this. Pt has f/u in June with Janett Billow. ?

## 2021-10-17 NOTE — Telephone Encounter (Signed)
Typically, gabapentin is okay to use following a stroke and is used quite frequently for pain. Cannot give full clearance for this particular patient without seeing patient in office but not sure if "clearance" is really needed as again, typically no contraindication from stroke standpoint but would need to ensure no other health issues which would be contraindicated.  ?

## 2021-10-17 NOTE — Telephone Encounter (Signed)
If this is new onset, I would highly encourage her to proceed to ED for emergent evaluation especially in setting of recent stroke. If she declines ED eval, then I would recommend to try for a sooner f/u with PCP. Our appt will be for hospital follow-up only, after PCP f/u, if PCP wants her to be evaluated from a neurological standpoint for these new symptoms, a new referral will need to be placed to ensure she is being seen by one of our neurologists. Thank you.  ?

## 2021-10-17 NOTE — Telephone Encounter (Signed)
We received a call from this patient regarding a new symptoms of leg weakness, pain and LE feeling cold. She stated this occurred after her discharge from the hospital. We have her scheduled for a hospital follow up for CVA with you on 12/21/2021. I advised her that I was not sure if you could address this new symptom prior to being seen in the office since it occurred after d/c and she has not been seen by any provider for it. I asked if she has seen or spoken with her PCP regarding this and she stated she has not, but that she has an appointment with them this Wednesday April 26. I advised her that I would send a message back to you regarding this, but that she should really contact her PCP. ? ?Can you please advise if there is anything you can recommend regarding these new symptoms? ? ?Thanks! ?

## 2021-10-17 NOTE — Telephone Encounter (Signed)
Pt states PCP wanting to prescribe her neurontin.  ?PCP would like to make sure prescribing this is okay with [facility] office, would like approval.  ?Pt Would like call back to discuss.  ?[phone] ? ?

## 2021-10-18 ENCOUNTER — Telehealth: Payer: Self-pay | Admitting: Neurology

## 2021-10-18 NOTE — Telephone Encounter (Signed)
Andria Frames, RN @ [facility] states that Dr Altamese Dilling wants to know if Dr Rexene Alberts is ok with pt being prescribed Gabapentin(due to recent stroke , pt has left leg numbness and pain) also they want to know if they need to prescribe or if Dr Rexene Alberts will.  Please call.  They are asking for a response today. ?

## 2021-10-18 NOTE — Telephone Encounter (Signed)
Called Andria Frames but spoke with Donella Stade, I  informed her of Janett Billow NP's message and recommendations.  She asked if ok for PCP to prescribe Neurontin, I advised per NP that may be prescribed from neurological standpoint.  ?

## 2021-10-18 NOTE — Telephone Encounter (Signed)
Gabapentin should be addressed by her primary neurologist, Dr. Leonie Man or with Janett Billow, NP ?

## 2021-10-18 NOTE — Telephone Encounter (Signed)
Pt has actually been seen only once for sleep by Dr Rexene Alberts. Pt sees Jessica NP but hasn't been seen since 2020. Pt has a pending appt. I called [place] back and left message for Andria Frames to call me back. Janett Billow NP can address this at office visit which is scheduled for 11/24/21.  ?

## 2021-10-18 NOTE — Telephone Encounter (Signed)
Patient was previously seen in 2020 for stroke follow-up.  She recently had a TIA - reviewed hospital notes and per Dr. Erlinda Hong, all symptoms resolved and returned to baseline.  ? ?Per chart review, she was seen in ED 09/05/2021 for bilateral low back pain and 11/2020 for left-sided back pain with left-sided sciatica. She should not have any residual symptoms from her TIA and this leg pain should be further evaluated by PCP and possible need of referral to either neurosurgery or pain management.  ? ? ?

## 2021-10-22 ENCOUNTER — Ambulatory Visit (INDEPENDENT_AMBULATORY_CARE_PROVIDER_SITE_OTHER): Payer: BC Managed Care – PPO

## 2021-10-22 DIAGNOSIS — I4891 Unspecified atrial fibrillation: Secondary | ICD-10-CM

## 2021-10-22 DIAGNOSIS — I639 Cerebral infarction, unspecified: Secondary | ICD-10-CM | POA: Diagnosis not present

## 2021-11-07 ENCOUNTER — Telehealth (HOSPITAL_BASED_OUTPATIENT_CLINIC_OR_DEPARTMENT_OTHER): Payer: Self-pay

## 2021-11-07 NOTE — Telephone Encounter (Signed)
[employer] Report received at [facility] office for this patient marked for Dr. Phineas Inches. Faxed to the correct office at [phone] ?

## 2021-11-10 ENCOUNTER — Encounter (HOSPITAL_COMMUNITY): Payer: Self-pay | Admitting: Gastroenterology

## 2021-11-10 ENCOUNTER — Telehealth: Payer: Self-pay

## 2021-11-10 NOTE — Telephone Encounter (Signed)
Hello again! This patient got put on Plavix mid April I think after everything was set up for the procedure 5/25 with mansouraty, and I didn't see any notation after about holding so wanted to reach out and let you know.

## 2021-11-10 NOTE — Telephone Encounter (Signed)
Left message on machine to call back  

## 2021-11-11 ENCOUNTER — Telehealth: Payer: Self-pay | Admitting: Gastroenterology

## 2021-11-11 NOTE — Telephone Encounter (Signed)
Left message on machine to call back   Pt seen 09/2021 for TIA and prescribed Plavix.  I have tried to reach the pt and have been unsuccessful.  I have sent a letter to her PCP, and cardiologist to ask if pt can hold prior to 5/25 EUS, I have not received a response yet.  Do you want her to proceed or should she postpone?    Dr Ardis Hughs can you please review for Dr Rush Landmark?

## 2021-11-11 NOTE — Telephone Encounter (Signed)
Pt responded to by phone see alternate note dated 5/19

## 2021-11-11 NOTE — Telephone Encounter (Signed)
I have sent a message to the pt via My Chart.  She does view her messages.  I have also left several messages on her voice mail.

## 2021-11-11 NOTE — Telephone Encounter (Signed)
Patient called back regarding procedure with Dr. Rush Landmark.  Please call her back as soon as you can regarding this appointment.  Thank you.

## 2021-11-11 NOTE — Telephone Encounter (Signed)
I explained that the she will need to follow up with neurology in regards to her plavix.  Once she gets the approval to hold plavix we will get her rescheduled. She will call back after her follow up appt. She will also have the note faxed to Dr Mansouraty's attention.

## 2021-11-11 NOTE — Telephone Encounter (Signed)
The procedure has been cancelled-Left message on machine to call back

## 2021-11-14 ENCOUNTER — Other Ambulatory Visit: Payer: Self-pay

## 2021-11-14 DIAGNOSIS — D128 Benign neoplasm of rectum: Secondary | ICD-10-CM

## 2021-11-14 NOTE — Telephone Encounter (Signed)
Agree with plan outlined by Dr. Rush Landmark.

## 2021-11-14 NOTE — Telephone Encounter (Signed)
Agree with this plan of action.  We will have to see what neurology says and then determine the timing for her procedure.  Not sure if they are going to let her come off of Plavix for at least 6 months to year but they need to let Dr. Fuller Plan and I know so that we can determine when we can try to have her come off Plavix for at least 5 days to perform EUS and EMR of the rectal neuroendocrine tumor.  In the interim of things as well, I think that we will need to perform an updated imaging with a CT abdomen/pelvis to ensure no evidence of lymphadenopathy and also obtain a chromogranin a level, since these findings may help determine if we need to do something sooner rather than later (although most the time subcentimeter rectal neuroendocrine tumors can be removed completely and the risk of lymph node metastasis is quite low). Thanks. GM

## 2021-11-14 NOTE — Telephone Encounter (Signed)
I have entered the order for chromogranin A and CT scan. The order has been sent to the schedulers to set up the CT.  I have sent a message to the patient with all the information.

## 2021-11-17 ENCOUNTER — Ambulatory Visit (HOSPITAL_COMMUNITY)
Admission: RE | Admit: 2021-11-17 | Payer: BC Managed Care – PPO | Source: Home / Self Care | Admitting: Gastroenterology

## 2021-11-17 ENCOUNTER — Encounter (HOSPITAL_COMMUNITY): Admission: RE | Payer: Self-pay | Source: Home / Self Care

## 2021-11-17 SURGERY — ULTRASOUND, LOWER GI TRACT, ENDOSCOPIC
Anesthesia: Monitor Anesthesia Care

## 2021-11-24 ENCOUNTER — Other Ambulatory Visit: Payer: BC Managed Care – PPO

## 2021-11-24 ENCOUNTER — Ambulatory Visit (INDEPENDENT_AMBULATORY_CARE_PROVIDER_SITE_OTHER): Payer: BC Managed Care – PPO | Admitting: Adult Health

## 2021-11-24 ENCOUNTER — Telehealth: Payer: Self-pay | Admitting: *Deleted

## 2021-11-24 ENCOUNTER — Encounter: Payer: Self-pay | Admitting: Adult Health

## 2021-11-24 VITALS — BP 146/102 | HR 73 | Ht 67.0 in | Wt 193.0 lb

## 2021-11-24 DIAGNOSIS — Z09 Encounter for follow-up examination after completed treatment for conditions other than malignant neoplasm: Secondary | ICD-10-CM | POA: Diagnosis not present

## 2021-11-24 DIAGNOSIS — G459 Transient cerebral ischemic attack, unspecified: Secondary | ICD-10-CM | POA: Diagnosis not present

## 2021-11-24 DIAGNOSIS — D128 Benign neoplasm of rectum: Secondary | ICD-10-CM

## 2021-11-24 DIAGNOSIS — I6350 Cerebral infarction due to unspecified occlusion or stenosis of unspecified cerebral artery: Secondary | ICD-10-CM

## 2021-11-24 NOTE — Telephone Encounter (Addendum)
LMVM-  I checked the [employer] website and your 30 day cardiac event monitor dates of service were 10/22/2021 through 11/20/2021.  Even if we were to ask for their 5 day extension, by the time you received the substitute electrodes, the extension period would be over.  We recommend returning your monitor to [employer].  They are processing your End of Service report at this time, which, will be reviewed by Dr. Carlyle Dolly.  Results will be forwarded to Frann Rider, NP.

## 2021-11-24 NOTE — Progress Notes (Signed)
I agree with the above plan 

## 2021-11-24 NOTE — Patient Instructions (Signed)
Continue clopidogrel 75 mg daily  and atorvastatin and zetia  for secondary stroke prevention  Please follow up after completion of your imaging - preferably, would recommend waiting at least 3 months after your stroke prior to holding plavix but if procedure needs to be done sooner based on imaging, this can be further discussed   Will follow up regarding heart monitor and further guidance   Continue to follow up with PCP regarding cholesterol and blood pressure management  Maintain strict control of hypertension with blood pressure goal below 130/90 and cholesterol with LDL cholesterol (bad cholesterol) goal below 70 mg/dL.   Signs of a Stroke? Follow the BEFAST method:  Balance Watch for a sudden loss of balance, trouble with coordination or vertigo Eyes Is there a sudden loss of vision in one or both eyes? Or double vision?  Face: Ask the person to smile. Does one side of the face droop or is it numb?  Arms: Ask the person to raise both arms. Does one arm drift downward? Is there weakness or numbness of a leg? Speech: Ask the person to repeat a simple phrase. Does the speech sound slurred/strange? Is the person confused ? Time: If you observe any of these signs, call 911.     Followup in the future with me in 6 months or call earlier if needed       Thank you for coming to see [place] at [facility]. I hope we have been able to provide you high quality care today.  You may receive a patient satisfaction survey over the next few weeks. We would appreciate your feedback and comments so that we may continue to improve ourselves and the health of our patients.

## 2021-11-24 NOTE — Progress Notes (Signed)
[facility] [address] [place] [address] [phone]       HOSPITAL FOLLOW UP NOTE  [name] Date of Birth:  1981-04-04 Medical Record Number:  [id number]   Reason for Referral:  hospital stroke follow up    SUBJECTIVE:   CHIEF COMPLAINT:  Chief Complaint  Patient presents with   Follow-up    RM 3 alone Pt is well and stable, no stroke concerns     HPI:   Maria Lang is a 41 year old female with history of hypertension, hyperlipidemia, former smoker, OSA, hx of R pontine stroke in 11/2018 without residual deficit who presented on 10/10/2021 with left-sided weakness, and decreased level of consciousness.  Personally reviewed hospitalization pertinent progress notes, lab work and imaging.  Evaluated by Dr. Erlinda Hong.  CT no acute abnormality.  Status post TNK.  CTA head and neck showed right P2 moderate stenosis.  MRI no acute infarct.  EF 60 to 65%, LDL 71, A1c 5.3.  DVT negative.  Hypercoag work-up pending at discharge.  Creatinine 1.20, ESR 30, UA WBC 21-50.  Evaluated by Dr. Erlinda Hong, exam neurologically intact without any deficit.  Symptoms possibly TIA vs aborted stroke s/p TNK although etiology not quite clear therefore recommended 30-day cardiac event monitor to rule out A-fib.  Recommended DAPT for 3 weeks and Plavix alone and continue atorvastatin 40 mg daily. Discharged home without therapy needs.     Today, 11/24/2021, patient is being seen for initial hospital follow-up.  She was previously seen in office 03/05/2019 for initial hospital stroke follow-up but did not follow-up after that time. She does have occasional left leg pain which she reports started shortly after discharge, was started on gabapentin by PCP which she uses as needed, has been getting better. No new stroke/TIA symptoms.  Has returned back to all prior activities.  Completed 3 weeks DAPT, remains on Plavix alone as well as atorvastatin, denies side effects.  Blood pressure  today 146/102, monitors at home and has been running on higher side of normal more recently.  She believes this is more due to her anxiety regarding her current health.  Shortly before her TIA, she was found to have a rectal tumor and was scheduled for surgical removal on 5/25 but this was canceled in setting of recent TIA.  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  She is awaiting to undergo CT abdomen/pelvis to ensure no evidence of lymphadenopathy. She questions at what point can Plavix be held to undergo removal.  She was able to complete about 1 to 2 weeks of cardiac monitor but had difficulty and monitor consistently said poor skin contact.  She has not yet sent back monitor. Remaining hypercoagulable labs pending at discharge were largely unremarkable.  No further concerns at this time.     PERTINENT IMAGING  MR BRAIN WO CONTRAST 10/11/2021 IMPRESSION: 1. No acute intracranial abnormality. 2. Chronic pontine infarct.  CTA HEAD/NECK 10/10/2021 IMPRESSION: 1. No emergent large vessel occlusion. 2. Moderate focal stenosis of the right P2 segment, new since 2020. Otherwise, patent vasculature of the head and neck.  CT HEAD 10/10/2021 IMPRESSION: 1. No acute intracranial pathology. 2. ASPECTS is 10  2D ECHO 10/11/2021 IMPRESSIONS   1. Left ventricular ejection fraction, by estimation, is 60 to 65%. The  left ventricle has normal function. The left ventricle has no regional  wall motion abnormalities. There is moderate concentric left ventricular  hypertrophy. Left ventricular  diastolic parameters are consistent with Grade I diastolic  dysfunction  (impaired relaxation).   2. Right ventricular systolic function is normal. The right ventricular  size is normal. Tricuspid regurgitation signal is inadequate for assessing  PA pressure.   3. The mitral valve is grossly normal. No evidence of mitral valve  regurgitation. No evidence of mitral stenosis.   4. The  aortic valve is tricuspid. Aortic valve regurgitation is not  visualized. No aortic stenosis is present.   5. The inferior vena cava is normal in size with greater than 50%  respiratory variability, suggesting right atrial pressure of 3 mmHg.        ROS:   14 system review of systems performed and negative with exception of those listed in HPI  PMH:  Past Medical History:  Diagnosis Date   Anxiety    Deviated septum    GERD (gastroesophageal reflux disease)    History of bilateral salpingectomy    Hyperlipidemia    Hypertension    Renal stones    Sleep apnea    Doesn't use everyday   Stroke [facility])    Thyroid disease     PSH:  Past Surgical History:  Procedure Laterality Date   BILATERAL SALPINGECTOMY  07.2016   CYSTOSCOPY WITH RETROGRADE PYELOGRAM, URETEROSCOPY AND STENT PLACEMENT Right 07/29/2015   Procedure: CYSTOSCOPY WITH RIGHT RETROGRADE PYELOGRAM, RIGHT URETEROSCOPY AND STENT PLACEMENT;  Surgeon: Irine Seal, MD;  Location: [facility];  Service: Urology;  Laterality: Right;   ECTOPIC PREGNANCY SURGERY  10/2013   HOLMIUM LASER APPLICATION Right 10/29/9739   Procedure: HOLMIUM LASER APPLICATION;  Surgeon: Irine Seal, MD;  Location: [facility];  Service: Urology;  Laterality: Right;   NASAL SEPTUM SURGERY     doen along with tonsillectomy    TONSILLECTOMY     TYMPANOSTOMY     x 3    Social History:  Social History   Socioeconomic History   Marital status: Married    Spouse name: Not on file   Number of children: Not on file   Years of education: Not on file   Highest education level: Not on file  Occupational History   Not on file  Tobacco Use   Smoking status: Former    Packs/day: 0.25    Years: 14.00    Pack years: 3.50    Types: Cigarettes    Start date: 10/04/1997    Quit date: 03/26/2020    Years since quitting: 1.6   Smokeless tobacco: Never  Vaping Use   Vaping Use: Some days  Substance and Sexual Activity   Alcohol use: No   Drug use: Never   Sexual  activity: Yes    Birth control/protection: None, Surgical  Other Topics Concern   Not on file  Social History Narrative   Not on file   Social Determinants of Health   Financial Resource Strain: Not on file  Food Insecurity: Not on file  Transportation Needs: Not on file  Physical Activity: Not on file  Stress: Not on file  Social Connections: Not on file  Intimate Partner Violence: Not on file    Family History:  Family History  Problem Relation Age of Onset   Hypertension Mother    Hypertension Father    Hyperlipidemia Father    Diabetes Brother    Diabetes Other     Medications:   Current Outpatient Medications on File Prior to Visit  Medication Sig Dispense Refill   ALPRAZolam (XANAX) 1 MG tablet Take 0.5-1 tablets by mouth 3 (three) times daily as needed for  anxiety.  1   atorvastatin (LIPITOR) 40 MG tablet Take 1 tablet (40 mg total) by mouth daily. 30 tablet 1   clopidogrel (PLAVIX) 75 MG tablet Take 1 tablet (75 mg total) by mouth daily. 30 tablet 1   ezetimibe (ZETIA) 10 MG tablet Take 10 mg by mouth daily.     gabapentin (NEURONTIN) 300 MG capsule Take by mouth as needed.     levothyroxine (SYNTHROID) 75 MCG tablet Take 75 mcg by mouth daily before breakfast.     losartan-hydrochlorothiazide (HYZAAR) 100-12.5 MG tablet Take 1 tablet by mouth daily.     ondansetron (ZOFRAN ODT) 4 MG disintegrating tablet Take 1 tablet (4 mg total) by mouth every 6 (six) hours as needed for nausea or vomiting. 120 tablet 2   pantoprazole (PROTONIX) 40 MG tablet TAKE 1 TABLET BY MOUTH TWICE DAILY BEFORE BREAKFAST AND DINNER (Patient taking differently: Take 40 mg by mouth 2 (two) times daily before a meal.) 180 tablet 0   sertraline (ZOLOFT) 100 MG tablet Take 150 mg by mouth daily.     No current facility-administered medications on file prior to visit.    Allergies:   Allergies  Allergen Reactions   Hydrocodeine [Dihydrocodeine] Shortness Of Breath and Nausea And Vomiting    Hydrocodone-Acetaminophen Shortness Of Breath and Nausea Only   Coreg [Carvedilol]     Headache    Labetalol Other (See Comments)    Head itches      OBJECTIVE:  Physical Exam  Vitals:   11/24/21 0909  BP: (!) 146/102  Pulse: 73  Weight: 193 lb (87.5 kg)  Height: _0  (1.702 m)   Body mass index is 30.23 kg/m. No results found.   General: well developed, well nourished, pleasant middle-age Caucasian female, seated, in no evident distress Head: head normocephalic and atraumatic.   Neck: supple with no carotid or supraclavicular bruits Cardiovascular: regular rate and rhythm, no murmurs Musculoskeletal: no deformity Skin:  no rash/petichiae Vascular:  Normal pulses all extremities   Neurologic Exam Mental Status: Awake and fully alert.  Fluent speech and language.  Oriented to place and time. Recent and remote memory intact. Attention span, concentration and fund of knowledge appropriate. Mood and affect appropriate.  Cranial Nerves: FPupils equal, briskly reactive to light. Extraocular movements full without nystagmus. Visual fields full to confrontation. Hearing intact. Facial sensation intact. Face, tongue, palate moves normally and symmetrically.  Motor: Normal bulk and tone. Normal strength in all tested extremity muscles Sensory.: intact to touch , pinprick , position and vibratory sensation.  Coordination: Rapid alternating movements normal in all extremities. Finger-to-nose and heel-to-shin performed accurately bilaterally. Gait and Station: Arises from chair without difficulty. Stance is normal. Gait demonstrates normal stride length and balance without use of AD. Tandem walk and heel toe without difficulty.  Reflexes: 1+ and symmetric. Toes downgoing.     NIHSS  0 Modified Rankin  0      ASSESSMENT: Maria Lang is a 41 y.o. year old female with recent likely TIA s/p TNK on 10/11/2021. Vascular risk factors include hx of R pontine stroke 11/2018 without  residual deficit, HTN, HLD, hx of OSA, obesity and hypothyroidism.      PLAN:  TIA Hx of R Pontine stroke:  Continue clopidogrel 75 mg daily  and atorvastatin 40 mg daily for secondary stroke prevention.   Hypercoagulable labs within normal limits Completed 1-2 weeks of cardiac monitor due to difficulty with adhesive pads. Will reach out to cardiology to further recommendations Discussed secondary  stroke prevention measures and importance of close PCP follow up for aggressive stroke risk factor management including BP goal<130/90, and HLD with LDL goal<70  Stroke labs 09/2021: LDL 71, A1c 5.3 I have gone over the pathophysiology of stroke, warning signs and symptoms, risk factors and their management in some detail with instructions to go to the closest emergency room for symptoms of concern. Surgical procedure: Would recommend waiting at least 3 months post stroke prior to holding Plavix to proceed with rectal tumor removal unless imaging and lab work awaiting to be completed with GI shows concerning findings where the procedure becomes more emergent and benefit may outweigh risk.  She will call office once imaging completed and at that point, will further discuss with GI.    Follow up in 6 months or call earlier if needed   CC:  [facility] provider: Dr. Leonie Man PCP: Jillyn Hidden, PA-C    I spent 59 minutes of face-to-face and non-face-to-face time with patient.  This included previsit chart review including review of recent hospitalization, lab review, study review, electronic health record documentation, patient education regarding recent stroke including possible etiology, secondary stroke prevention measures and importance of managing stroke risk factors, surgical procedure and clearance and answered all other questions to patient satisfaction   Frann Rider, AGNP-BC  [facility] [address] [place], [place] [address]  Phone [phone] Fax  [phone] Note: This document was prepared with digital dictation and possible smart phrase technology. Any transcriptional errors that result from this process are unintentional.

## 2021-11-24 NOTE — Telephone Encounter (Signed)
-----   Message from Frann Rider, NP sent at 11/24/2021  9:52 AM EDT ----- Good morning! Patient was seen this morning for hospital follow-up.  She was able to complete about 1 to 2 weeks of her 30-day heart monitor as she was having difficulty with adhesive pads and monitor reporting poor skin contact.  She has not yet sent monitor back in - wasn't sure if she should obtain new pads or monitor to complete a full 30 days prior to sending back or if she should just send her monitor in to be reviewed and go from there. If you could just follow up with her on that, I would greatly appreciate it! Thank you!!

## 2021-11-26 LAB — CHROMOGRANIN A: Chromogranin A (ng/mL): 106.5 ng/mL — ABNORMAL HIGH (ref 0.0–101.8)

## 2021-11-28 ENCOUNTER — Encounter (HOSPITAL_COMMUNITY): Payer: Self-pay

## 2021-11-28 ENCOUNTER — Ambulatory Visit (HOSPITAL_COMMUNITY)
Admission: RE | Admit: 2021-11-28 | Discharge: 2021-11-28 | Disposition: A | Discharge status: Home / Self Care | Payer: BC Managed Care – PPO | Source: Ambulatory Visit | Attending: Gastroenterology | Admitting: Gastroenterology

## 2021-11-28 DIAGNOSIS — D128 Benign neoplasm of rectum: Secondary | ICD-10-CM | POA: Diagnosis present

## 2021-11-28 IMAGING — CT CT ABD-PELV W/ CM
2 of 4 series · 15 of 46 positions shown, 17 images · IV contrast (OMNIPAQUE)
Comparison: Multiple priors including CT [DATE]

CLINICAL DATA: Rectal neuroendocrine tumor.  * Tracking Code: BO *.

EXAM:
CT ABDOMEN AND PELVIS WITH CONTRAST
TECHNIQUE: Multidetector CT imaging of the abdomen and pelvis was performed
using the standard protocol following bolus administration of
intravenous contrast.

[Series 2: axial st · axial · 0.86mm/px · z∈[-268,+182]mm · 12 of 103 slices shown, 14 images]
[im 7/103  soft-tissue]
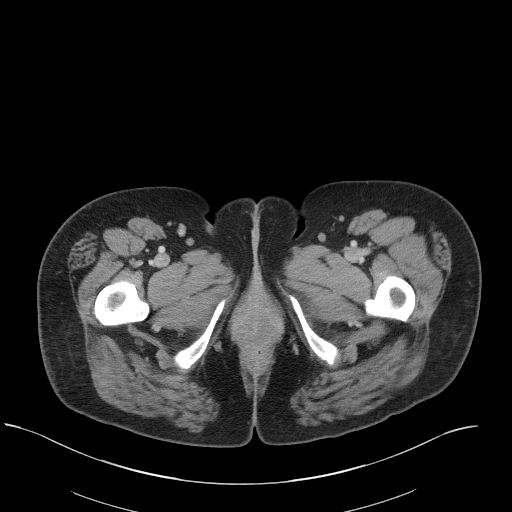
[im 7/103  bone]
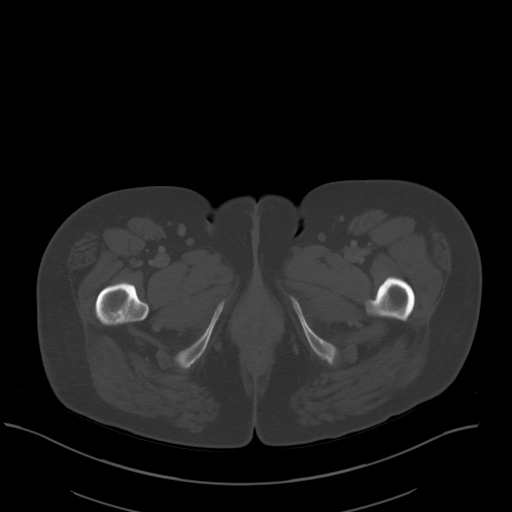
[im 19/103  soft-tissue]
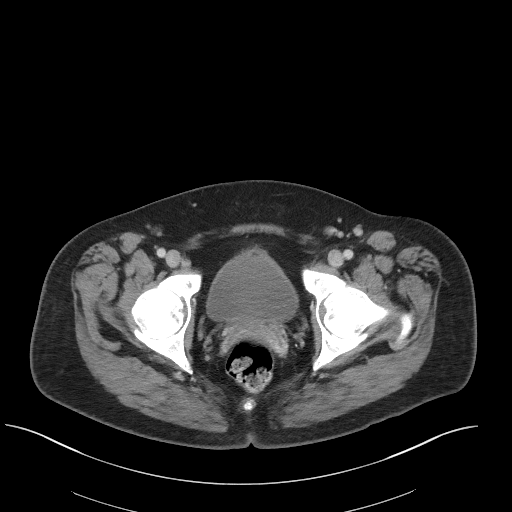
[im 25/103  soft-tissue]
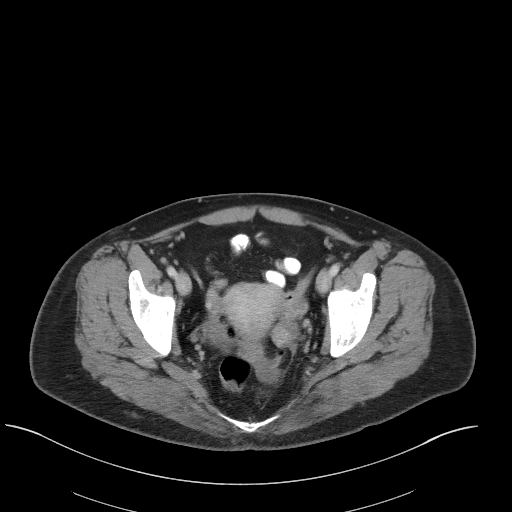
[im 31/103  soft-tissue]
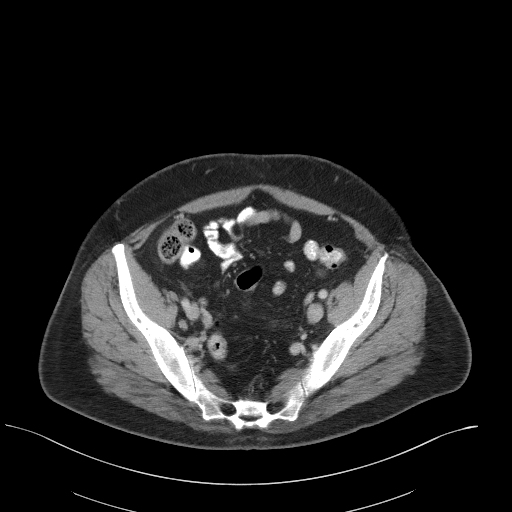
[im 43/103  soft-tissue]
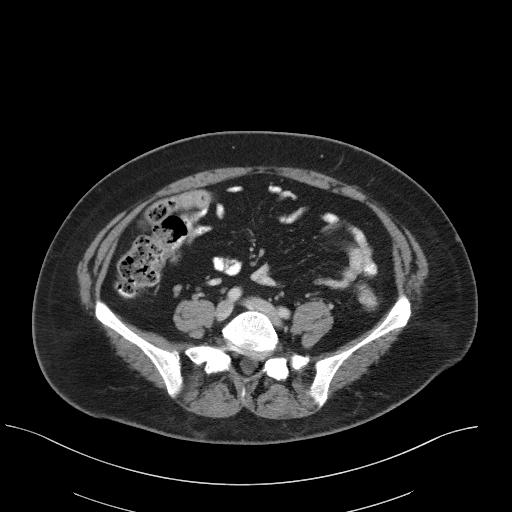
[im 49/103  soft-tissue]
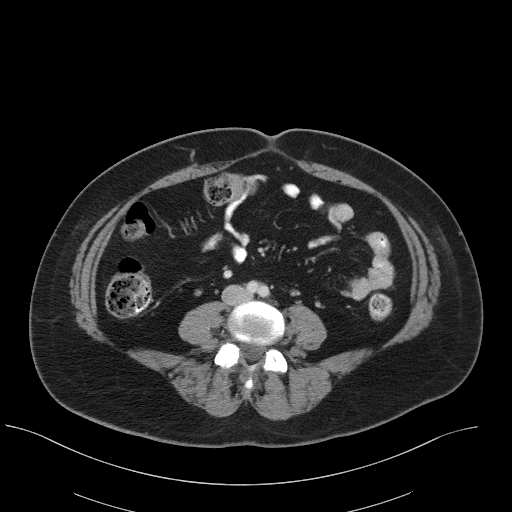
[im 55/103  soft-tissue]
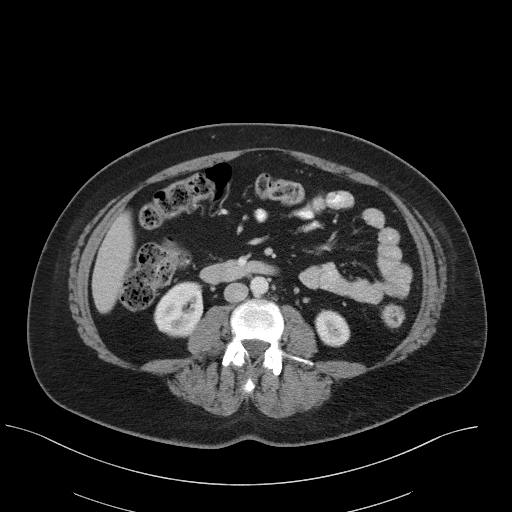
[im 67/103  soft-tissue]
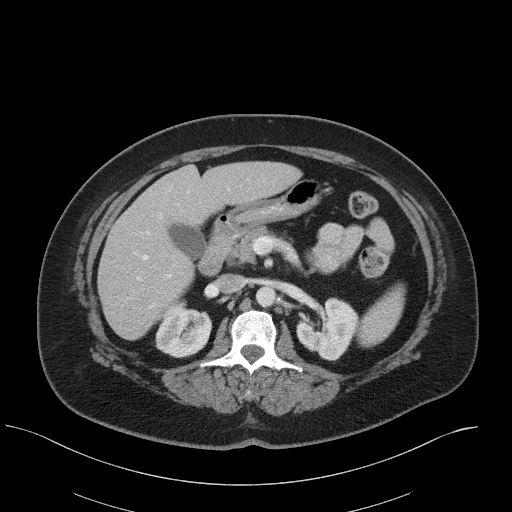
[im 73/103  soft-tissue]
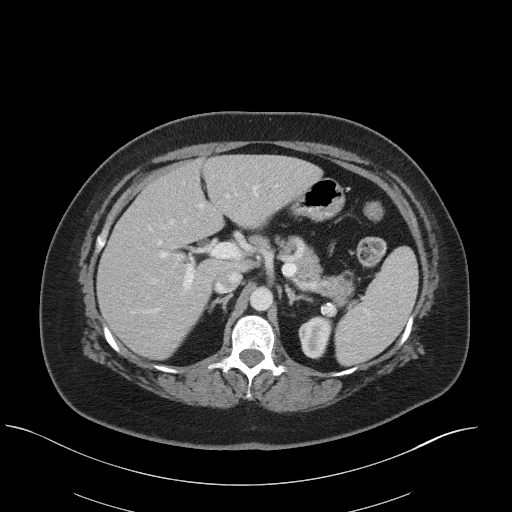
[im 73/103  bone]
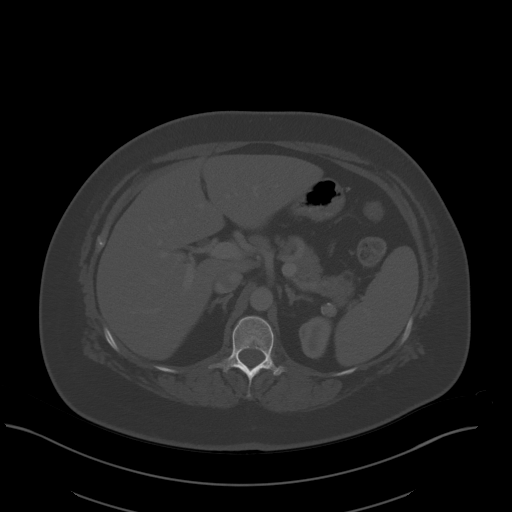
[im 79/103  soft-tissue]
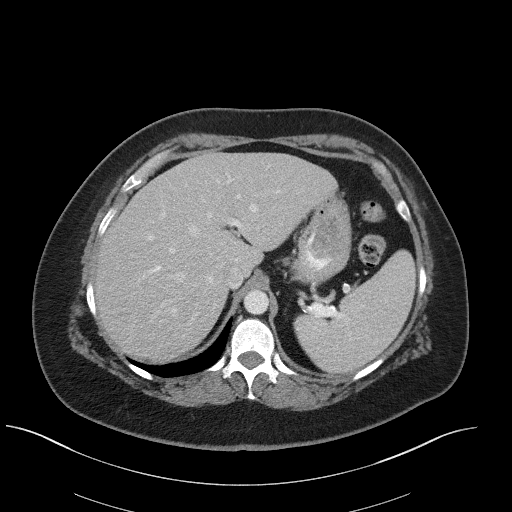
[im 91/103  soft-tissue]
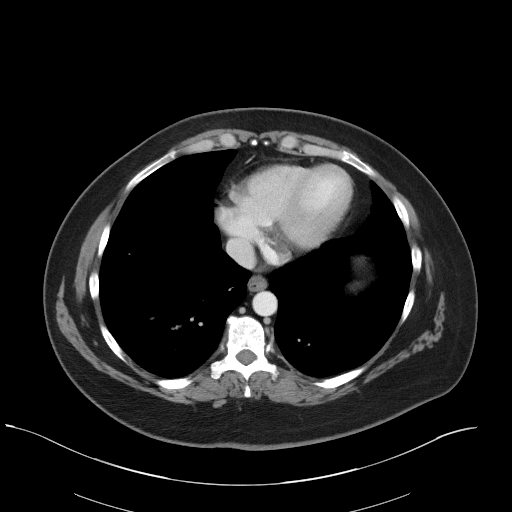
[im 97/103  soft-tissue]
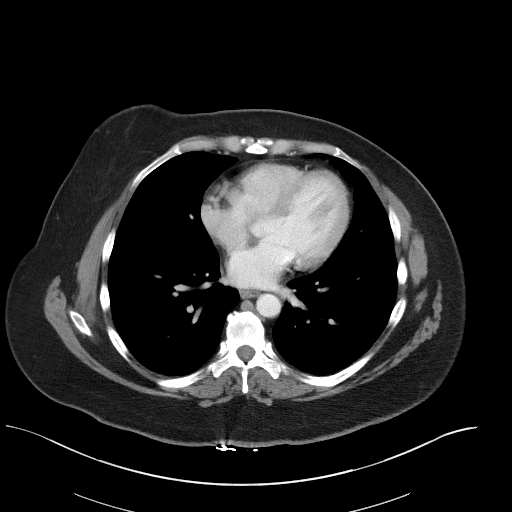

[Series 4: coronal st · coronal · 0.80mm/px · 3 of 101 slices shown]
[im 34/101  soft-tissue]
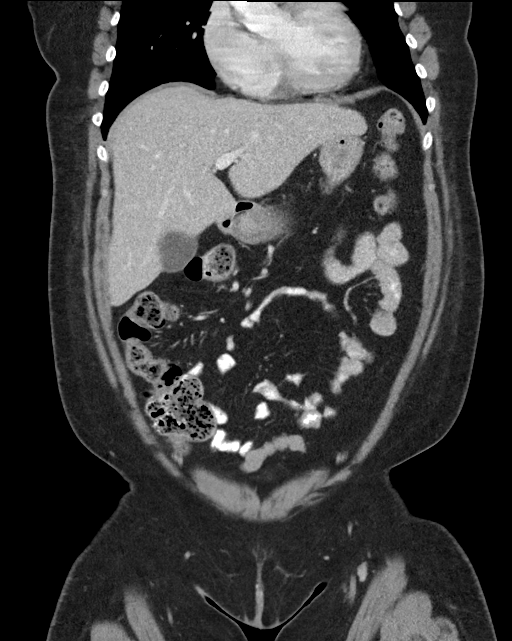
[im 45/101  soft-tissue]
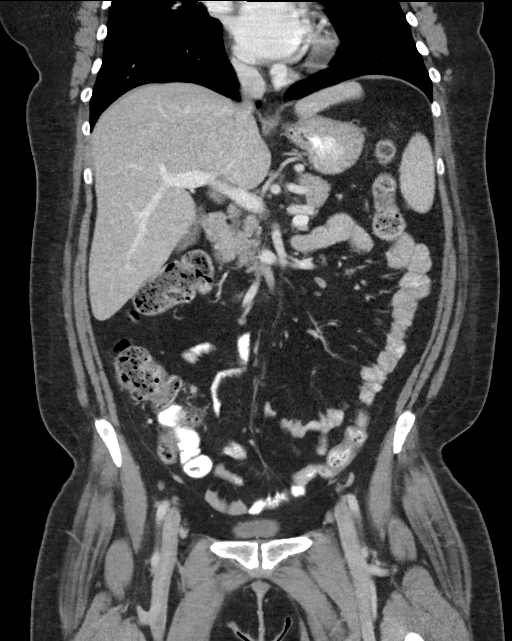
[im 56/101  soft-tissue]
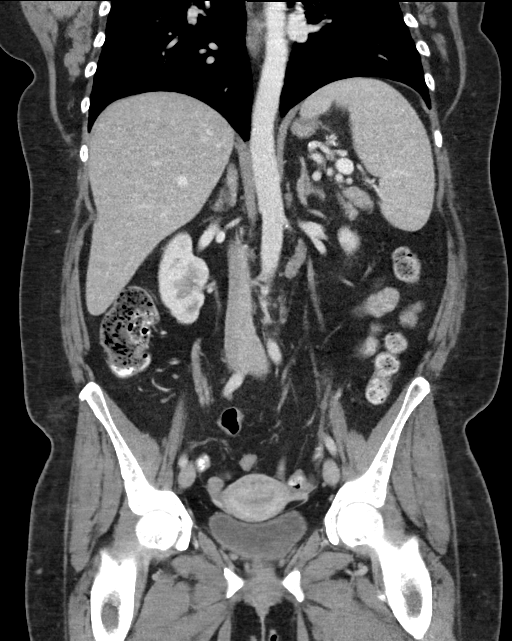

[15 of 46 positions shown; findings below may reference images not displayed]

RADIATION DOSE REDUCTION: This exam was performed according to the
departmental dose-optimization program which includes automated
exposure control, adjustment of the mA and/or kV according to
patient size and/or use of iterative reconstruction technique.

CONTRAST:  100mL OMNIPAQUE IOHEXOL 300 MG/ML  SOLN
FINDINGS: Lower chest: No acute abnormality.

Hepatobiliary: No suspicious hepatic lesion. Gallbladder is
unremarkable. No biliary ductal dilation.

Pancreas: No pancreatic ductal dilation or evidence of acute
inflammation.

Spleen: No splenomegaly or focal splenic lesion.

Adrenals/Urinary Tract: Bilateral adrenal glands appear normal.
Fluid density 17 mm right upper pole renal lesion is considered
benign requiring no independent follow-up. Left greater than right
cortical renal scarring. Nonobstructive left renal calculi measure
up to 4 mm. Urinary bladder is unremarkable for degree of
distension.

Stomach/Bowel: Radiopaque enteric contrast material traverses the
ascending colon. Mild low-density thickening of the gastric antrum
may reflect sequela of chronic inflammation. No pathologic dilation
of small or large bowel. Low-density rectal wall nodularity on image
78/2 and sagittal image 74/5 measures 3.0 x 2.5 x 1.5 cm.

Vascular/Lymphatic: Aortic and branch vessel atherosclerosis.
Prominent retroperitoneal lymph nodes measure up to 9 mm in the left
periaortic station just below the level of the renal vein on image
43/2, stable dating back to at least [DATE] consistent
with a benign finding.

Reproductive: Uterus and bilateral adnexa are unremarkable.

Other: Significant abdominopelvic free fluid. No discrete peritoneal
or omental nodularity.

Musculoskeletal: No aggressive lytic or blastic lesion of bone.
IMPRESSION: 1. Low-density rectal wall nodularity measures 3.0 x 2.5 x 1.5 cm
possibly reflects patient's primary rectal neoplasm.
2. No convincing evidence of metastatic disease in the abdomen or
pelvis.
3. Mild low-density thickening of the gastric antrum may reflect
sequela of chronic inflammation.

## 2021-11-28 MED ORDER — SODIUM CHLORIDE (PF) 0.9 % IJ SOLN
INTRAMUSCULAR | Status: AC
  Filled 2021-11-28: qty 50

## 2021-11-28 MED ORDER — IOHEXOL 300 MG/ML  SOLN
100.0000 mL | Freq: Once | INTRAMUSCULAR | Status: AC | PRN
  Administered 2021-11-28: 100 mL via INTRAVENOUS

## 2021-11-29 ENCOUNTER — Other Ambulatory Visit: Payer: Self-pay

## 2021-11-29 DIAGNOSIS — D128 Benign neoplasm of rectum: Secondary | ICD-10-CM

## 2021-11-29 DIAGNOSIS — K921 Melena: Secondary | ICD-10-CM

## 2021-11-29 DIAGNOSIS — R110 Nausea: Secondary | ICD-10-CM

## 2021-11-29 DIAGNOSIS — K625 Hemorrhage of anus and rectum: Secondary | ICD-10-CM

## 2021-11-30 ENCOUNTER — Other Ambulatory Visit: Payer: Self-pay | Admitting: Cardiology

## 2021-11-30 DIAGNOSIS — I4891 Unspecified atrial fibrillation: Secondary | ICD-10-CM

## 2021-11-30 DIAGNOSIS — I639 Cerebral infarction, unspecified: Secondary | ICD-10-CM

## 2021-12-01 ENCOUNTER — Encounter: Payer: Self-pay | Admitting: Adult Health

## 2021-12-01 NOTE — Telephone Encounter (Signed)
Patient request clearance recently to undergo GI procedure. She had a TIA back in April with hx of prior stroke in 2020. She is currently on Plavix.  Typically, it is recommended to wait 3 to 6 months post stroke for elective procedure but if procedure is emergent, this timeframe can vary.  Currently, she is 2 months out from her TIA. Request further input regarding emergency of procedure and if she would be cleared to hold Plavix to proceed with procedure.  Thank you.

## 2021-12-02 NOTE — Telephone Encounter (Signed)
PS, Thank you for review. I will update everyone after the [place] in a couple of weeks. Patty, go ahead and start looking for a procedure date that is at least 3 months out from her stroke/TIA. Hold Plavix for 5 days prior to procedure and will restart as soon as able usually within 48 hours if EMR is performed. Thanks. GM

## 2021-12-02 NOTE — Telephone Encounter (Signed)
All, I have never met the patient, my partner Dr. Fuller Plan referred her to me based on her colonoscopy findings.  Endoscopically, the lesion that he documented does not have the size that would be noted on the CT scan findings. Now certainly the CT scan could be seeing something that is not the lesion and could be an overcall. There is discordance between the lesion and the CT.  This is what concerns me waiting 6 months. However, to think that the carcinoid/neuroendocrine tumor Dr. 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 She still needs a procedure at some point. I think at least 3 months of antiplatelet therapy is fine and my schedule is such that I may not be able to get her in until the 3 to 37-month mark so if you guys are okay with that and with her being off Plavix for 5 days before procedure (pending MDC still feels that we need to go after this lesion sooner) then we will go ahead and get her scheduled. However [place] discussion is not for another 2 weeks and I can certainly update the team after that discussion. Appreciate everyone's thoughtfulness, I know Dr. Fuller Plan is out until next week but in any case appreciate our neurology service helping [place] with this patient.   GM

## 2021-12-04 ENCOUNTER — Other Ambulatory Visit: Payer: Self-pay | Admitting: Cardiology

## 2021-12-05 ENCOUNTER — Telehealth: Payer: Self-pay

## 2021-12-05 ENCOUNTER — Other Ambulatory Visit: Payer: Self-pay

## 2021-12-05 DIAGNOSIS — D3A8 Other benign neuroendocrine tumors: Secondary | ICD-10-CM

## 2021-12-05 NOTE — Telephone Encounter (Signed)
Mansouraty, Telford Nab., MD  You; Garvin Fila, MD; Frann Rider, NP; Ladene Artist, MD 3 days ago    PS, Thank you for review. I will update everyone after the [place] in a couple of weeks. Selene Peltzer, go ahead and start looking for a procedure date that is at least 3 months out from her stroke/TIA. Hold Plavix for 5 days prior to procedure and will restart as soon as able usually within 48 hours if EMR is performed. Thanks. GM      Note    Garvin Fila, MD  Mansouraty, Telford Nab., MD 3 days ago    I agree we should wait atleast 3 months unless it is deemed an emergency. Ok to hold plavix for 3-5 days prior to procedure with small but acceptable periprocedural risk of stroke/TIA if patient is willing    Mansouraty, Telford Nab., MD  Frann Rider, NP; Garvin Fila, MD; Ladene Artist, MD 3 days ago    All, I have never met the patient, my partner Dr. Fuller Plan referred her to me based on her colonoscopy findings.  Endoscopically, the lesion that he documented does not have the size that would be noted on the CT scan findings. Now certainly the CT scan could be seeing something that is not the lesion and could be an overcall. There is discordance between the lesion and the CT.  This is what concerns me waiting 6 months. However, to think that the carcinoid/neuroendocrine tumor Dr. 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 She still needs a procedure at some point. I think at least 3 months of antiplatelet therapy is fine and my schedule is such that I may not be able to get her in until the 3 to 24-month mark so if you guys are okay with that and with her being off Plavix for 5 days before procedure (pending MDC still feels that we need to go after this lesion sooner) then we will go ahead and get her scheduled. However [place] discussion is not for another 2 weeks and I can certainly update the team after that  discussion. Appreciate everyone's thoughtfulness, I know Dr. Fuller Plan is out until next week but in any case appreciate our neurology service helping [place] with this patient.   GM      Note    Garvin Fila, MD  Frann Rider, NP 4 days ago    agree    Foye Deer  P [facility] Clinical Pool (supporting Frann Rider, NP) 4 days ago    [place] thank you    Minna Antis, RN  Foye Deer 4 days ago    Salley Scarlet,   [place] sent a note to your GI doctor and Dr Leonie Man to get their professional opinions on this matter.   Verneita Griffes RN    Foye Deer  P [facility] Clinical Pool (supporting Frann Rider, NP) 4 days ago    Did you get in touch with the other dr concerning this matter     Frann Rider, NP  Minna Antis, RN; Mansouraty, Telford Nab., MD; Garvin Fila, MD 4 days ago    Patient request clearance recently to undergo GI procedure. She had a TIA back in April with hx of prior stroke in 2020. She is currently on Plavix.  Typically, it is recommended to wait 3 to 6 months post stroke for elective procedure but if procedure  is emergent, this timeframe can vary.  Currently, she is 2 months out from her TIA. Request further input regarding emergency of procedure and if she would be cleared to hold Plavix to proceed with procedure.  Thank you.      Note    Minna Antis, RN  Frann Rider, NP 4 days ago    [place]  P [facility] Clinical Pool (supporting Frann Rider, NP) 4 days ago    The CT scan has been completed and dr said he has to discuss it at an upcoming [place] he don't think it can't wait. It's much larger than what the dr who did colonoscopy said

## 2021-12-05 NOTE — Telephone Encounter (Signed)
EUS EMR 02/13/22 at 1130 am [facility] with GM  Plavix x 5 days

## 2021-12-06 NOTE — Telephone Encounter (Signed)
EUS/EMR scheduled, pt instructed and medications reviewed.  Patient instructions mailed to home and sent to My Chart.  Patient to call with any questions or concerns.   The pt has viewed the information on My Chart

## 2021-12-07 ENCOUNTER — Other Ambulatory Visit: Payer: BC Managed Care – PPO

## 2021-12-07 DIAGNOSIS — K625 Hemorrhage of anus and rectum: Secondary | ICD-10-CM

## 2021-12-07 DIAGNOSIS — D128 Benign neoplasm of rectum: Secondary | ICD-10-CM

## 2021-12-07 DIAGNOSIS — R110 Nausea: Secondary | ICD-10-CM

## 2021-12-07 DIAGNOSIS — K921 Melena: Secondary | ICD-10-CM

## 2021-12-10 LAB — CHROMOGRANIN A: Chromogranin A (ng/mL): 31.4 ng/mL (ref 0.0–101.8)

## 2021-12-14 ENCOUNTER — Other Ambulatory Visit: Payer: Self-pay

## 2021-12-14 NOTE — Progress Notes (Signed)
The proposed treatment discussed in conference is for discussion purpose only and is not a binding recommendation.  The patients have not been physically examined, or presented with their treatment options.  Therefore, final treatment plans cannot be decided.  

## 2021-12-21 ENCOUNTER — Inpatient Hospital Stay: Payer: BC Managed Care – PPO | Admitting: Adult Health

## 2021-12-28 ENCOUNTER — Encounter: Payer: Self-pay | Admitting: Gastroenterology

## 2021-12-28 NOTE — Progress Notes (Unsigned)
Case discussed at multidisciplinary conference recently. Review of imaging was performed and the noted rectal abnormality seems to be out of proportion to the endoscopic appearance of the lesion.  After review with our radiology colleagues, it is felt that this is likely an overcall but true endoscopic ultrasound will guide and ensure nothing else is being missed. We will await the upcoming EUS to ensure nothing else is being missed and attempt resection as able.  Justice Britain, MD [facility] Office # [phone]

## 2022-01-03 ENCOUNTER — Telehealth: Payer: Self-pay | Admitting: *Deleted

## 2022-01-03 ENCOUNTER — Encounter: Payer: Self-pay | Admitting: *Deleted

## 2022-01-03 NOTE — Telephone Encounter (Signed)
-----   Message from Arnoldo Lenis, MD sent at 01/01/2022 12:09 PM EDT ----- SHould be repeated, Edd Fabian can you arrange another 30 day monitor for patient for stroke  JBranch MD

## 2022-01-03 NOTE — Telephone Encounter (Signed)
Laurine Blazer, LPN  6/88/6484  7:20 AM EDT Back to Top    Notified via my chart.  Copy to pcp.

## 2022-01-17 ENCOUNTER — Other Ambulatory Visit: Payer: Self-pay

## 2022-01-17 NOTE — Patient Outreach (Signed)
[facility]) Care Management  01/17/2022  Cire Deyarmin 10-Jan-1981 [id number]   First telephone outreach attempt to obtain mRS. No answer. Left message for returned call.  Philmore Pali [facility] Management Assistant [phone]

## 2022-01-17 NOTE — Patient Outreach (Signed)
[facility]) Care Management  01/17/2022  Maria Lang 1981/05/28 [id number]   Telephone outreach to patient to obtain mRS was successfully completed. MRS= [place] Management Assistant [phone]

## 2022-02-06 ENCOUNTER — Encounter (HOSPITAL_COMMUNITY): Payer: Self-pay | Admitting: Gastroenterology

## 2022-02-06 NOTE — Progress Notes (Signed)
Attempted to obtain medical history via telephone, unable to reach at this time. HIPAA compliant voicemail message left requesting return call to pre surgical testing department. 

## 2022-02-07 ENCOUNTER — Encounter: Payer: Self-pay | Admitting: *Deleted

## 2022-02-13 ENCOUNTER — Ambulatory Visit (HOSPITAL_COMMUNITY): Payer: BC Managed Care – PPO

## 2022-02-13 ENCOUNTER — Other Ambulatory Visit: Payer: Self-pay

## 2022-02-13 ENCOUNTER — Ambulatory Visit (HOSPITAL_COMMUNITY)
Admission: RE | Admit: 2022-02-13 | Discharge: 2022-02-13 | Disposition: A | Discharge status: Home / Self Care | Payer: BC Managed Care – PPO | Attending: Gastroenterology | Admitting: Gastroenterology

## 2022-02-13 ENCOUNTER — Ambulatory Visit (HOSPITAL_COMMUNITY): Payer: BC Managed Care – PPO | Admitting: Anesthesiology

## 2022-02-13 ENCOUNTER — Telehealth: Payer: Self-pay

## 2022-02-13 ENCOUNTER — Encounter (HOSPITAL_COMMUNITY): Payer: Self-pay | Admitting: Gastroenterology

## 2022-02-13 ENCOUNTER — Encounter (HOSPITAL_COMMUNITY)
Admission: RE | Disposition: A | Discharge status: Home / Self Care | Payer: Self-pay | Source: Home / Self Care | Attending: Gastroenterology

## 2022-02-13 DIAGNOSIS — Z85048 Personal history of other malignant neoplasm of rectum, rectosigmoid junction, and anus: Secondary | ICD-10-CM | POA: Diagnosis not present

## 2022-02-13 DIAGNOSIS — K641 Second degree hemorrhoids: Secondary | ICD-10-CM | POA: Insufficient documentation

## 2022-02-13 DIAGNOSIS — I899 Noninfective disorder of lymphatic vessels and lymph nodes, unspecified: Secondary | ICD-10-CM | POA: Insufficient documentation

## 2022-02-13 DIAGNOSIS — G4730 Sleep apnea, unspecified: Secondary | ICD-10-CM | POA: Diagnosis not present

## 2022-02-13 DIAGNOSIS — Z9889 Other specified postprocedural states: Secondary | ICD-10-CM | POA: Insufficient documentation

## 2022-02-13 DIAGNOSIS — Z87891 Personal history of nicotine dependence: Secondary | ICD-10-CM | POA: Insufficient documentation

## 2022-02-13 DIAGNOSIS — I509 Heart failure, unspecified: Secondary | ICD-10-CM | POA: Insufficient documentation

## 2022-02-13 DIAGNOSIS — E039 Hypothyroidism, unspecified: Secondary | ICD-10-CM | POA: Insufficient documentation

## 2022-02-13 DIAGNOSIS — K219 Gastro-esophageal reflux disease without esophagitis: Secondary | ICD-10-CM | POA: Insufficient documentation

## 2022-02-13 DIAGNOSIS — K6289 Other specified diseases of anus and rectum: Secondary | ICD-10-CM | POA: Insufficient documentation

## 2022-02-13 DIAGNOSIS — I110 Hypertensive heart disease with heart failure: Secondary | ICD-10-CM | POA: Diagnosis not present

## 2022-02-13 DIAGNOSIS — Z8673 Personal history of transient ischemic attack (TIA), and cerebral infarction without residual deficits: Secondary | ICD-10-CM | POA: Insufficient documentation

## 2022-02-13 DIAGNOSIS — D3A8 Other benign neuroendocrine tumors: Secondary | ICD-10-CM

## 2022-02-13 HISTORY — PX: ENDOSCOPIC MUCOSAL RESECTION: SHX6839

## 2022-02-13 HISTORY — PX: HEMOSTASIS CLIP PLACEMENT: SHX6857

## 2022-02-13 HISTORY — PX: SUBMUCOSAL LIFTING INJECTION: SHX6855

## 2022-02-13 HISTORY — PX: EUS: SHX5427

## 2022-02-13 HISTORY — PX: FLEXIBLE SIGMOIDOSCOPY: SHX5431

## 2022-02-13 LAB — PREGNANCY, URINE: Preg Test, Ur: NEGATIVE

## 2022-02-13 SURGERY — SIGMOIDOSCOPY, FLEXIBLE
Anesthesia: Monitor Anesthesia Care

## 2022-02-13 MED ORDER — FENTANYL CITRATE (PF) 100 MCG/2ML IJ SOLN
25.0000 ug | Freq: Once | INTRAMUSCULAR | Status: AC
  Administered 2022-02-13: 25 ug via INTRAVENOUS

## 2022-02-13 MED ORDER — CLOPIDOGREL BISULFATE 75 MG PO TABS: 75.0000 mg | ORAL_TABLET | Freq: Every day | ORAL | 1 refills | Status: DC

## 2022-02-13 MED ORDER — FENTANYL CITRATE (PF) 100 MCG/2ML IJ SOLN
INTRAMUSCULAR | Status: AC
  Filled 2022-02-13: qty 2

## 2022-02-13 MED ORDER — HYOSCYAMINE SULFATE 0.125 MG SL SUBL
0.2500 mg | SUBLINGUAL_TABLET | Freq: Once | SUBLINGUAL | Status: AC
  Administered 2022-02-13: 0.25 mg via SUBLINGUAL
  Filled 2022-02-13: qty 2

## 2022-02-13 MED ORDER — PROPOFOL 10 MG/ML IV BOLUS
INTRAVENOUS | Status: DC | PRN
  Administered 2022-02-13 (×2): 30 mg via INTRAVENOUS
  Administered 2022-02-13 (×3): 20 mg via INTRAVENOUS

## 2022-02-13 MED ORDER — DICYCLOMINE HCL 20 MG PO TABS: 20.0000 mg | ORAL_TABLET | Freq: Four times a day (QID) | ORAL | 0 refills | Status: AC

## 2022-02-13 MED ORDER — LIDOCAINE HCL URETHRAL/MUCOSAL 2 % EX GEL
CUTANEOUS | Status: AC
  Filled 2022-02-13: qty 30

## 2022-02-13 MED ORDER — LIDOCAINE HCL URETHRAL/MUCOSAL 2 % EX GEL
1.0000 | Freq: Once | CUTANEOUS | Status: AC
  Administered 2022-02-13: 1

## 2022-02-13 MED ORDER — PROPOFOL 500 MG/50ML IV EMUL
INTRAVENOUS | Status: DC | PRN
  Administered 2022-02-13: 150 ug/kg/min via INTRAVENOUS

## 2022-02-13 MED ORDER — PROPOFOL 10 MG/ML IV BOLUS
INTRAVENOUS | Status: AC
  Filled 2022-02-13: qty 20

## 2022-02-13 MED ORDER — DEXMEDETOMIDINE (PRECEDEX) IN NS 20 MCG/5ML (4 MCG/ML) IV SYRINGE
PREFILLED_SYRINGE | INTRAVENOUS | Status: DC | PRN
  Administered 2022-02-13: 8 ug via INTRAVENOUS

## 2022-02-13 MED ORDER — LACTATED RINGERS IV SOLN
INTRAVENOUS | Status: AC | PRN
  Administered 2022-02-13: 20 mL/h via INTRAVENOUS

## 2022-02-13 MED ORDER — SODIUM CHLORIDE 0.9 % IV SOLN: INTRAVENOUS | Status: DC

## 2022-02-13 NOTE — Anesthesia Preprocedure Evaluation (Addendum)
Anesthesia Evaluation  Patient identified by MRN, date of birth, ID band Patient awake    Reviewed: Allergy & Precautions, NPO status , Patient's Chart, lab work & pertinent test results  Airway Mallampati: III  TM Distance: <3 FB Neck ROM: Full    Dental  (+) Teeth Intact, Dental Advisory Given, Implants,    Pulmonary sleep apnea and Continuous Positive Airway Pressure Ventilation , former smoker,    Pulmonary exam normal breath sounds clear to auscultation       Cardiovascular hypertension, Pt. on medications and Pt. on home beta blockers +CHF  Normal cardiovascular exam Rhythm:Regular Rate:Normal     Neuro/Psych PSYCHIATRIC DISORDERS Anxiety CVA    GI/Hepatic Neg liver ROS, GERD  Medicated,  Endo/Other  Hypothyroidism neuroendorine tumor  Renal/GU negative Renal ROS     Musculoskeletal negative musculoskeletal ROS (+)   Abdominal   Peds  Hematology  (+) Blood dyscrasia (Plavix), ,   Anesthesia Other Findings Day of surgery medications reviewed with the patient.  Reproductive/Obstetrics                           Anesthesia Physical Anesthesia Plan  ASA: 3  Anesthesia Plan: MAC   Post-op Pain Management: Minimal or no pain anticipated   Induction: Intravenous  PONV Risk Score and Plan: 2 and TIVA and Treatment may vary due to age or medical condition  Airway Management Planned: Nasal Cannula and Natural Airway  Additional Equipment:   Intra-op Plan:   Post-operative Plan:   Informed Consent: I have reviewed the patients History and Physical, chart, labs and discussed the procedure including the risks, benefits and alternatives for the proposed anesthesia with the patient or authorized representative who has indicated his/her understanding and acceptance.     Dental advisory given  Plan Discussed with: CRNA and Anesthesiologist  Anesthesia Plan Comments:          Anesthesia Quick Evaluation

## 2022-02-13 NOTE — Op Note (Signed)
[facility] Patient Name: Maria Lang Procedure Date: 02/13/2022 MRN: [id number] Attending MD: Justice Britain , MD Date of Birth: 1980-08-12 CSN: [id number] Age: 41 Admit Type: Outpatient Procedure:                Lower EUS Indications:              Rectal deformity found on endoscopy; subepithelial                            tumor versus extrinsic compression, Neuroendocrine                            Tumor Providers:                Justice Britain, MD, Carlyn Reichert, RN, Luan Moore, Technician Referring MD:             Pricilla Riffle. Fuller Plan, MD Medicines:                Monitored Anesthesia Care Complications:            No immediate complications. Estimated Blood Loss:     Estimated blood loss was minimal. Procedure:                Pre-Anesthesia Assessment:                           - Prior to the procedure, a History and Physical                            was performed, and patient medications and                            allergies were reviewed. The patient's tolerance of                            previous anesthesia was also reviewed. The risks                            and benefits of the procedure and the sedation                            options and risks were discussed with the patient.                            All questions were answered, and informed consent                            was obtained. Prior Anticoagulants: The patient has                            taken Plavix (clopidogrel), last dose was 5 days                            prior to  procedure. ASA Grade Assessment: III - A                            patient with severe systemic disease. After                            reviewing the risks and benefits, the patient was                            deemed in satisfactory condition to undergo the                            procedure.                           After obtaining informed consent, the  endoscope was                            passed under direct vision. Throughout the                            procedure, the patient's blood pressure, pulse, and                            oxygen saturations were monitored continuously. The                            [id number] ([id number]) Olympus therapeutic endoscope                            was introduced through the anus and advanced to the                            the descending colon. The [id number] ([id number])                            Olympus radial ultrasound scope was introduced                            through the anus and advanced to the the                            rectosigmoid junction for ultrasound. The lower EUS                            was accomplished without difficulty. The patient                            tolerated the procedure. The quality of the bowel                            preparation was adequate. Scope In: 10:40:02 AM Scope Out: 11:13:41 AM Scope Withdrawal Time: 0 hours 0 minutes 1 second  Total Procedure Duration: 0 hours 33 minutes 39 seconds  Findings:      The digital rectal exam findings include hemorrhoids. Pertinent       negatives include no palpable rectal lesions.      ENDOSCOPIC FINDING: :      A small post polypectomy scar was found in the mid rectum (approximately       5 cm from the anal os). The scar tissue was healthy in appearance. After       the EUS was completed, preparations were made for mucosal resection due       to previous positive margins that had beren found. NBI imaging and       White-light endoscopy was done to demarcate the borders of the lesion.       Everlift was injected to raise the lesion (it lifted partially due to       previous scar tissue). Band ligator and Cap snare mucosal resection was       performed. Resection and retrieval were complete. No evidence of deeper       defect noted. To prevent post-procedural bleeding after mucosal       resection,  three hemostatic clips were successfully placed (MR       conditional). There was no bleeding during, or at the end, of the       procedure.      Non-bleeding non-thrombosed internal hemorrhoids were found during       retroflexion, during perianal exam and during digital exam. The       hemorrhoids were Grade II (internal hemorrhoids that prolapse but reduce       spontaneously).      Normal mucosa was found in the entire visualized colon otherwise.      ENDOSONOGRAPHIC FINDING: :      Local wall thickening was found in the rectum. This was encountered from       4 to 5 cm (from the anal verge). The site of thickening was       non-circumferential and located predominantly at the posterior rectal       wall. This finding appeared to be primarily due to increased thickness       of the luminal interface/superficial mucosa (Layer 1) and deep mucosa       (Layer 2). This is at scar site.      The rectum was otherwise normal in EUS appearance.      No malignant-appearing lymph nodes were visualized in the perirectal       region and in the left iliac region. The nodes were.      The internal anal sphincter was visualized endosonographically and       appeared normal. Impression:               FLEX Impression:                           - Hemorrhoids found on digital rectal exam.                           - Post-polypectomy scar in the mid rectum. After                            EUS completed, the scar site was resected due to  previous positive margins. Cap-Band-Snare EMR                            performed. Clips (MR conditional) were placed to                            decrease risk of bleeding.                           - Non-bleeding non-thrombosed internal hemorrhoids.                           - Normal mucosa in the entire examined colon                            otherwise.                           EUS Impression:                           - Local wall  thickening was visualized                            endosonographically in the rectum. This was due to                            increased thickness of the luminal                            interface/superficial mucosa (Layer 1) and deep                            mucosa (Layer 2). This was at scar site.                           - Endosonographic images of the rectum were                            otherwise unremarkable.                           - No malignant-appearing lymph nodes were                            visualized endosonographically in the perirectal                            region and in the left iliac region.                           - The internal anal sphincter was visualized                            endosonographically and appeared normal. Moderate Sedation:      Not Applicable - Patient had care per Anesthesia. Recommendation:           -  The patient will be observed post-procedure,                            until all discharge criteria are met.                           - Discharge patient to home.                           - Patient has a contact number available for                            emergencies. The signs and symptoms of potential                            delayed complications were discussed with the                            patient. Return to normal activities tomorrow.                            Written discharge instructions were provided to the                            patient.                           - Resume previous diet.                           - May restart Plavix in 48 hours (02/15/22 PM) to                            decrease risk of post-interventional bleeding risk.                           - Await path results.                           - Repeat lower endoscopic ultrasound for                            surveillance if negative pathology then plan repeat                            Lower EUS in 1-2 years. If positive  margins, then                            may need repeat in 56-months.                           - The findings and recommendations were discussed                            with the patient.                           -  The findings and recommendations were discussed                            with the patient's family. Procedure Code(s):        --- Professional ---                           [phone], Sigmoidoscopy, flexible; with endoscopic                            mucosal resection                           09381, Sigmoidoscopy, flexible; with endoscopic                            ultrasound examination Diagnosis Code(s):        --- Professional ---                           K64.1, Second degree hemorrhoids                           Z98.890, Other specified postprocedural states                           K62.89, Other specified diseases of anus and rectum                           I89.9, Noninfective disorder of lymphatic vessels                            and lymph nodes, unspecified CPT copyright 2019 American Medical Association. All rights reserved. The codes documented in this report are preliminary and upon coder review may  be revised to meet current compliance requirements. Justice Britain, MD 02/13/2022 11:33:30 AM Number of Addenda: 0

## 2022-02-13 NOTE — Discharge Instructions (Signed)

## 2022-02-13 NOTE — Telephone Encounter (Signed)
-----   Message from Irving Copas., MD sent at 02/13/2022  4:36 PM EDT ----- Maria Lang, Please call this patient tomorrow and see how she is doing.  She had rectal discomfort post procedure without any clear issues on follow-up imaging/x-rays and improvement by the time we were leaving though she still had some discomfort.  I was querying a rectal spasm.  Please update as able. Thanks. GM

## 2022-02-13 NOTE — Anesthesia Postprocedure Evaluation (Signed)
Anesthesia Post Note  Patient: Maria Lang  Procedure(s) Performed: LOWER ENDOSCOPIC ULTRASOUND (EUS) FLEXIBLE SIGMOIDOSCOPY SUBMUCOSAL LIFTING INJECTION ENDOSCOPIC MUCOSAL RESECTION HEMOSTASIS CLIP PLACEMENT     Patient location during evaluation: Endoscopy Anesthesia Type: MAC Level of consciousness: oriented, awake and alert and awake Pain management: pain level controlled Vital Signs Assessment: post-procedure vital signs reviewed and stable Respiratory status: spontaneous breathing, nonlabored ventilation, respiratory function stable and patient connected to nasal cannula oxygen Cardiovascular status: blood pressure returned to baseline and stable Postop Assessment: no headache, no backache and no apparent nausea or vomiting Anesthetic complications: no   No notable events documented.  Last Vitals:  Vitals:   02/13/22 1250 02/13/22 1300  BP: (!) 150/91 (!) 135/104  Pulse: (!) 54 (!) 57  Resp: 12 12  Temp:    SpO2: 96% 96%    Last Pain:  Vitals:   02/13/22 1240  TempSrc:   PainSc: [name]

## 2022-02-13 NOTE — Transfer of Care (Signed)
Immediate Anesthesia Transfer of Care Note  Patient: Tracey Hermance  Procedure(s) Performed: LOWER ENDOSCOPIC ULTRASOUND (EUS) FLEXIBLE SIGMOIDOSCOPY SUBMUCOSAL LIFTING INJECTION ENDOSCOPIC MUCOSAL RESECTION  Patient Location: Endoscopy Unit  Anesthesia Type:MAC  Level of Consciousness: awake and alert   Airway & Oxygen Therapy: Patient Spontanous Breathing and Patient connected to nasal cannula oxygen  Post-op Assessment: Report given to RN and Post -op Vital signs reviewed and stable  Post vital signs: Reviewed and stable  Last Vitals:  Vitals Value Taken Time  BP 115/74 02/13/22 1120  Temp    Pulse 66 02/13/22 1120  Resp 12 02/13/22 1120  SpO2 100 % 02/13/22 1120  Vitals shown include unvalidated device data.  Last Pain:  Vitals:   02/13/22 0909  TempSrc: Temporal  PainSc: 0-No pain         Complications: No notable events documented.

## 2022-02-13 NOTE — H&P (Signed)
GASTROENTEROLOGY PROCEDURE H&P NOTE   Primary Care Physician: Jillyn Hidden, PA-C  HPI: Maria Lang is a 41 y.o. female who presents for lower EUS/flexible sigmoidoscopy for evaluation of rectal neuroendocrine tumor (with positive margins); case was delayed as a result of patient having TIA/stroke in March/April.  Here for attempt at removal of any remaining tissue.  Past Medical History:  Diagnosis Date   Anxiety    Deviated septum    GERD (gastroesophageal reflux disease)    History of bilateral salpingectomy    Hyperlipidemia    Hypertension    Renal stones    Sleep apnea    Doesn't use everyday   Stroke [facility])    Thyroid disease    Past Surgical History:  Procedure Laterality Date   BILATERAL SALPINGECTOMY  07.2016   CYSTOSCOPY WITH RETROGRADE PYELOGRAM, URETEROSCOPY AND STENT PLACEMENT Right 07/29/2015   Procedure: CYSTOSCOPY WITH RIGHT RETROGRADE PYELOGRAM, RIGHT URETEROSCOPY AND STENT PLACEMENT;  Surgeon: Irine Seal, MD;  Location: [facility];  Service: Urology;  Laterality: Right;   ECTOPIC PREGNANCY SURGERY  10/2013   HOLMIUM LASER APPLICATION Right 09/26/5954   Procedure: HOLMIUM LASER APPLICATION;  Surgeon: Irine Seal, MD;  Location: [facility];  Service: Urology;  Laterality: Right;   NASAL SEPTUM SURGERY     doen along with tonsillectomy    TONSILLECTOMY     TYMPANOSTOMY     x 3   Current Facility-Administered Medications  Medication Dose Route Frequency Provider Last Rate Last Admin   0.9 %  sodium chloride infusion   Intravenous Continuous Mansouraty, Telford Nab., MD       lactated ringers infusion    Continuous PRN Mansouraty, Telford Nab., MD 20 mL/hr at 02/13/22 0945 Continued from Pre-op at 02/13/22 0945    Current Facility-Administered Medications:    0.9 %  sodium chloride infusion, , Intravenous, Continuous, Mansouraty, Telford Nab., MD   lactated ringers infusion, , , Continuous PRN, Mansouraty, Telford Nab., MD, Last Rate: 20 mL/hr at 02/13/22 0945,  Continued from Pre-op at 02/13/22 0945 Allergies  Allergen Reactions   Hydrocodeine [Dihydrocodeine] Shortness Of Breath and Nausea And Vomiting   Hydrocodone-Acetaminophen Shortness Of Breath and Nausea Only   Coreg [Carvedilol]     Headache    Labetalol Other (See Comments)    Head itches   Family History  Problem Relation Age of Onset   Hypertension Mother    Hypertension Father    Hyperlipidemia Father    Diabetes Brother    Diabetes Other    Social History   Socioeconomic History   Marital status: Married    Spouse name: Not on file   Number of children: Not on file   Years of education: Not on file   Highest education level: Not on file  Occupational History   Not on file  Tobacco Use   Smoking status: Former    Packs/day: 0.25    Years: 14.00    Total pack years: 3.50    Types: Cigarettes    Start date: 10/04/1997    Quit date: 03/26/2020    Years since quitting: 1.8   Smokeless tobacco: Never  Vaping Use   Vaping Use: Some days  Substance and Sexual Activity   Alcohol use: No   Drug use: Never   Sexual activity: Yes    Birth control/protection: None, Surgical  Other Topics Concern   Not on file  Social History Narrative   Not on file   Social Determinants of Radio broadcast assistant  Strain: Not on file  Food Insecurity: Not on file  Transportation Needs: Not on file  Physical Activity: Not on file  Stress: Not on file  Social Connections: Not on file  Intimate Partner Violence: Not on file    Physical Exam: Today's Vitals   02/13/22 0909  BP: (!) 151/108  Pulse: 70  Resp: 16  Temp: 98 F (36.7 C)  TempSrc: Temporal  SpO2: 99%  Weight: 87.5 kg  Height: '5\' 6"'$  (1.676 m)  PainSc: 0-No pain   Body mass index is 31.15 kg/m. GEN: NAD EYE: Sclerae anicteric ENT: MMM CV: Non-tachycardic GI: Soft, NT/ND NEURO:  Alert & Oriented x 3  Lab Results: No results for input(s): "WBC", "HGB", "HCT", "PLT" in the last 72 hours. BMET No  results for input(s): "NA", "K", "CL", "CO2", "GLUCOSE", "BUN", "CREATININE", "CALCIUM" in the last 72 hours. LFT No results for input(s): "PROT", "ALBUMIN", "AST", "ALT", "ALKPHOS", "BILITOT", "BILIDIR", "IBILI" in the last 72 hours. PT/INR No results for input(s): "LABPROT", "INR" in the last 72 hours.   Impression / Plan: This is a 41 y.o.female who presents for lower EUS/flexible sigmoidoscopy for evaluation of rectal neuroendocrine tumor (with positive margins); case was delayed as a result of patient having TIA/stroke in March/April.  Here for attempt at removal of any remaining tissue.  The risks of an EUS including intestinal perforation, bleeding, infection, aspiration, and medication effects were discussed as was the possibility it may not give a definitive diagnosis if a biopsy is performed.   The risks and benefits of endoscopic evaluation/treatment were discussed with the patient and/or family; these include but are not limited to the risk of perforation, infection, bleeding, missed lesions, lack of diagnosis, severe illness requiring hospitalization, as well as anesthesia and sedation related illnesses.  The patient's history has been reviewed, patient examined, no change in status, and deemed stable for procedure.  The patient and/or family is agreeable to proceed.    Maria Britain, MD [facility] Office # [phone]

## 2022-02-14 ENCOUNTER — Encounter: Payer: Self-pay | Admitting: Gastroenterology

## 2022-02-14 LAB — SURGICAL PATHOLOGY

## 2022-02-14 NOTE — Progress Notes (Signed)
No answer

## 2022-02-14 NOTE — Telephone Encounter (Signed)
See alternate note-GM called pt will need to check on pt Thursday

## 2022-02-15 ENCOUNTER — Encounter (HOSPITAL_COMMUNITY): Payer: Self-pay | Admitting: Gastroenterology

## 2022-02-16 ENCOUNTER — Telehealth: Payer: Self-pay

## 2022-02-16 NOTE — Telephone Encounter (Signed)
I spoke with the pt and she tells me that she is much better with only minimal rectal discomfort.  She will call if she does not continue to improve.  FYI Dr Rush Landmark

## 2022-02-16 NOTE — Telephone Encounter (Signed)
Thank you for update. GM 

## 2022-02-16 NOTE — Telephone Encounter (Signed)
-----   Message from Timothy Lasso, RN sent at 02/14/2022  1:24 PM EDT ----- Call pt to get an update

## 2022-03-03 ENCOUNTER — Other Ambulatory Visit: Payer: Self-pay | Admitting: Cardiology

## 2022-03-14 ENCOUNTER — Encounter: Payer: Self-pay | Admitting: *Deleted

## 2022-04-04 ENCOUNTER — Other Ambulatory Visit: Payer: Self-pay | Admitting: Cardiology

## 2022-05-02 ENCOUNTER — Other Ambulatory Visit: Payer: Self-pay | Admitting: Cardiology

## 2022-05-30 NOTE — Progress Notes (Deleted)
[facility] [address] [place] [address] [phone]       STROKE FOLLOW UP NOTE  [name] Date of Birth:  1981-06-26 Medical Record Number:  [id number]   Reason for Referral: stroke follow up    SUBJECTIVE:   CHIEF COMPLAINT:  No chief complaint on file.   HPI:   Update 05/31/2022 JM: Patient returns for 39-monthstroke follow-up.  Overall stable from stroke standpoint without new or reoccurring stroke/TIA symptoms.  Remains on Plavix and atorvastatin and Zetia.  Blood pressure well controlled.  Routinely follows with PCP     History provided for reference purposes only Initial visit 11/24/2021 JM: Patient is being seen for initial hospital follow-up.  She was previously seen in office 03/05/2019 for initial hospital stroke follow-up but did not follow-up after that time. She does have occasional left leg pain which she reports started shortly after discharge, was started on gabapentin by PCP which she uses as needed, has been getting better. No new stroke/TIA symptoms.  Has returned back to all prior activities.  Completed 3 weeks DAPT, remains on Plavix alone as well as atorvastatin, denies side effects.  Blood pressure today 146/102, monitors at home and has been running on higher side of normal more recently.  She believes this is more due to her anxiety regarding her current health.  Shortly before her TIA, she was found to have a rectal tumor and was scheduled for surgical removal on 5/25 but this was canceled in setting of recent TIA.  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  She is awaiting to undergo CT abdomen/pelvis to ensure no evidence of lymphadenopathy. She questions at what point can Plavix be held to undergo removal.  She was able to complete about 1 to 2 weeks of cardiac monitor but had difficulty and monitor consistently said poor skin contact.  She has not yet sent back monitor. Remaining hypercoagulable labs  pending at discharge were largely unremarkable.  No further concerns at this time.  Stroke admission 10/10/2021 [facility] Maria Lang a 41year old female with history of hypertension, hyperlipidemia, former smoker, OSA, hx of R pontine stroke in 11/2018 without residual deficit who presented on 10/10/2021 with left-sided weakness, and decreased level of consciousness.  Personally reviewed hospitalization pertinent progress notes, lab work and imaging.  Evaluated by Dr. XErlinda Hong  CT no acute abnormality.  Status post TNK.  CTA head and neck showed right P2 moderate stenosis.  MRI no acute infarct.  EF 60 to 65%, LDL 71, A1c 5.3.  DVT negative.  Hypercoag work-up pending at discharge.  Creatinine 1.20, ESR 30, UA WBC 21-50.  Evaluated by Dr. XErlinda Hong exam neurologically intact without any deficit.  Symptoms possibly TIA vs aborted stroke s/p TNK although etiology not quite clear therefore recommended 30-day cardiac event monitor to rule out A-fib.  Recommended DAPT for 3 weeks and Plavix alone and continue atorvastatin 40 mg daily. Discharged home without therapy needs.     PERTINENT IMAGING  MR BRAIN WO CONTRAST 10/11/2021 IMPRESSION: 1. No acute intracranial abnormality. 2. Chronic pontine infarct.  CTA HEAD/NECK 10/10/2021 IMPRESSION: 1. No emergent large vessel occlusion. 2. Moderate focal stenosis of the right P2 segment, new since 2020. Otherwise, patent vasculature of the head and neck.  CT HEAD 10/10/2021 IMPRESSION: 1. No acute intracranial pathology. 2. ASPECTS is 10  2D ECHO 10/11/2021 IMPRESSIONS   1. Left ventricular ejection fraction, by estimation, is 60 to 65%. The  left ventricle has normal function.  The left ventricle has no regional  wall motion abnormalities. There is moderate concentric left ventricular  hypertrophy. Left ventricular  diastolic parameters are consistent with Grade I diastolic dysfunction  (impaired relaxation).   2. Right ventricular systolic function is normal.  The right ventricular  size is normal. Tricuspid regurgitation signal is inadequate for assessing  PA pressure.   3. The mitral valve is grossly normal. No evidence of mitral valve  regurgitation. No evidence of mitral stenosis.   4. The aortic valve is tricuspid. Aortic valve regurgitation is not  visualized. No aortic stenosis is present.   5. The inferior vena cava is normal in size with greater than 50%  respiratory variability, suggesting right atrial pressure of 3 mmHg.        ROS:   14 system review of systems performed and negative with exception of those listed in HPI  PMH:  Past Medical History:  Diagnosis Date   Anxiety    Deviated septum    GERD (gastroesophageal reflux disease)    History of bilateral salpingectomy    Hyperlipidemia    Hypertension    Renal stones    Sleep apnea    Doesn't use everyday   Stroke [facility])    Thyroid disease     PSH:  Past Surgical History:  Procedure Laterality Date   BILATERAL SALPINGECTOMY  07.2016   CYSTOSCOPY WITH RETROGRADE PYELOGRAM, URETEROSCOPY AND STENT PLACEMENT Right 07/29/2015   Procedure: CYSTOSCOPY WITH RIGHT RETROGRADE PYELOGRAM, RIGHT URETEROSCOPY AND STENT PLACEMENT;  Surgeon: Irine Seal, MD;  Location: [facility];  Service: Urology;  Laterality: Right;   ECTOPIC PREGNANCY SURGERY  10/2013   ENDOSCOPIC MUCOSAL RESECTION  02/13/2022   Procedure: ENDOSCOPIC MUCOSAL RESECTION;  Surgeon: Irving Copas., MD;  Location: [facility];  Service: Gastroenterology;;   EUS N/A 02/13/2022   Procedure: LOWER ENDOSCOPIC ULTRASOUND (EUS);  Surgeon: Irving Copas., MD;  Location: [facility];  Service: Gastroenterology;  Laterality: N/A;   FLEXIBLE SIGMOIDOSCOPY N/A 02/13/2022   Procedure: FLEXIBLE SIGMOIDOSCOPY;  Surgeon: Rush Landmark Telford Nab., MD;  Location: [facility];  Service: Gastroenterology;  Laterality: N/A;   HEMOSTASIS CLIP PLACEMENT  02/13/2022   Procedure: HEMOSTASIS CLIP PLACEMENT;  Surgeon:  Irving Copas., MD;  Location: [facility];  Service: Gastroenterology;;   HOLMIUM LASER APPLICATION Right 0/08/8880   Procedure: HOLMIUM LASER APPLICATION;  Surgeon: Irine Seal, MD;  Location: [facility];  Service: Urology;  Laterality: Right;   NASAL SEPTUM SURGERY     doen along with tonsillectomy    SUBMUCOSAL LIFTING INJECTION  02/13/2022   Procedure: SUBMUCOSAL LIFTING INJECTION;  Surgeon: Rush Landmark Telford Nab., MD;  Location: [facility];  Service: Gastroenterology;;   TONSILLECTOMY     TYMPANOSTOMY     x 3    Social History:  Social History   Socioeconomic History   Marital status: Married    Spouse name: Not on file   Number of children: Not on file   Years of education: Not on file   Highest education level: Not on file  Occupational History   Not on file  Tobacco Use   Smoking status: Former    Packs/day: 0.25    Years: 14.00    Total pack years: 3.50    Types: Cigarettes    Start date: 10/04/1997    Quit date: 03/26/2020    Years since quitting: 2.1   Smokeless tobacco: Never  Vaping Use   Vaping Use: Some days  Substance and Sexual Activity   Alcohol use:  No   Drug use: Never   Sexual activity: Yes    Birth control/protection: None, Surgical  Other Topics Concern   Not on file  Social History Narrative   Not on file   Social Determinants of Health   Financial Resource Strain: Not on file  Food Insecurity: Not on file  Transportation Needs: Not on file  Physical Activity: Not on file  Stress: Not on file  Social Connections: Not on file  Intimate Partner Violence: Not on file    Family History:  Family History  Problem Relation Age of Onset   Hypertension Mother    Hypertension Father    Hyperlipidemia Father    Diabetes Brother    Diabetes Other     Medications:   Current Outpatient Medications on File Prior to Visit  Medication Sig Dispense Refill   ALPRAZolam (XANAX) 1 MG tablet Take 0.5-1 tablets by mouth 3 (three) times daily  as needed for anxiety.  1   amLODipine (NORVASC) 10 MG tablet Take 10 mg by mouth daily.     atorvastatin (LIPITOR) 80 MG tablet Take 80 mg by mouth daily.     clopidogrel (PLAVIX) 75 MG tablet Take 1 tablet (75 mg total) by mouth daily. 30 tablet 1   dicyclomine (BENTYL) 20 MG tablet Take 1 tablet (20 mg total) by mouth every 6 (six) hours. 10 tablet 0   ezetimibe (ZETIA) 10 MG tablet Take 10 mg by mouth daily.     gabapentin (NEURONTIN) 300 MG capsule Take 300 mg by mouth daily as needed (nerve pain).     levothyroxine (SYNTHROID) 75 MCG tablet Take 75 mcg by mouth daily before breakfast.     losartan-hydrochlorothiazide (HYZAAR) 100-12.5 MG tablet TAKE 1 TABLET BY MOUTH EVERY DAY 30 tablet 1   ondansetron (ZOFRAN ODT) 4 MG disintegrating tablet Take 1 tablet (4 mg total) by mouth every 6 (six) hours as needed for nausea or vomiting. (Patient not taking: Reported on 02/07/2022) 120 tablet 2   ondansetron (ZOFRAN-ODT) 8 MG disintegrating tablet Take 8 mg by mouth every 8 (eight) hours as needed for nausea/vomiting.     pantoprazole (PROTONIX) 40 MG tablet TAKE 1 TABLET BY MOUTH TWICE DAILY BEFORE BREAKFAST AND DINNER (Patient taking differently: Take 80 mg by mouth daily.) 180 tablet 0   propranolol ER (INDERAL LA) 60 MG 24 hr capsule Take 60 mg by mouth daily.     sertraline (ZOLOFT) 100 MG tablet Take 100 mg by mouth daily.     No current facility-administered medications on file prior to visit.    Allergies:   Allergies  Allergen Reactions   Hydrocodeine [Dihydrocodeine] Shortness Of Breath and Nausea And Vomiting   Hydrocodone-Acetaminophen Shortness Of Breath and Nausea Only   Coreg [Carvedilol]     Headache    Labetalol Other (See Comments)    Head itches      OBJECTIVE:  Physical Exam  There were no vitals filed for this visit.  There is no height or weight on file to calculate BMI. No results found.   General: well developed, well nourished, pleasant middle-age  Caucasian female, seated, in no evident distress Head: head normocephalic and atraumatic.   Neck: supple with no carotid or supraclavicular bruits Cardiovascular: regular rate and rhythm, no murmurs Musculoskeletal: no deformity Skin:  no rash/petichiae Vascular:  Normal pulses all extremities   Neurologic Exam Mental Status: Awake and fully alert.  Fluent speech and language.  Oriented to place and time. Recent and remote  memory intact. Attention span, concentration and fund of knowledge appropriate. Mood and affect appropriate.  Cranial Nerves: FPupils equal, briskly reactive to light. Extraocular movements full without nystagmus. Visual fields full to confrontation. Hearing intact. Facial sensation intact. Face, tongue, palate moves normally and symmetrically.  Motor: Normal bulk and tone. Normal strength in all tested extremity muscles Sensory.: intact to touch , pinprick , position and vibratory sensation.  Coordination: Rapid alternating movements normal in all extremities. Finger-to-nose and heel-to-shin performed accurately bilaterally. Gait and Station: Arises from chair without difficulty. Stance is normal. Gait demonstrates normal stride length and balance without use of AD. Tandem walk and heel toe without difficulty.  Reflexes: 1+ and symmetric. Toes downgoing.     NIHSS  0 Modified Rankin  0      ASSESSMENT: Maria Lang is a 41 y.o. year old female with recent likely TIA s/p TNK on 10/11/2021. Vascular risk factors include hx of R pontine stroke 11/2018 without residual deficit, HTN, HLD, hx of OSA, obesity and hypothyroidism.      PLAN:  TIA Hx of R Pontine stroke:  Continue clopidogrel 75 mg daily  and atorvastatin 40 mg daily for secondary stroke prevention.   Hypercoagulable labs within normal limits Completed 1-2 weeks of cardiac monitor due to difficulty with adhesive pads. Will reach out to cardiology to further recommendations Discussed secondary stroke  prevention measures and importance of close PCP follow up for aggressive stroke risk factor management including BP goal<130/90, and HLD with LDL goal<70  Stroke labs 09/2021: LDL 71, A1c 5.3 I have gone over the pathophysiology of stroke, warning signs and symptoms, risk factors and their management in some detail with instructions to go to the closest emergency room for symptoms of concern. Surgical procedure: Would recommend waiting at least 3 months post stroke prior to holding Plavix to proceed with rectal tumor removal unless imaging and lab work awaiting to be completed with GI shows concerning findings where the procedure becomes more emergent and benefit may outweigh risk.  She will call office once imaging completed and at that point, will further discuss with GI.    Follow up in 6 months or call earlier if needed   CC:  [facility] provider: Dr. Leonie Man PCP: Jillyn Hidden, PA-C    I spent 59 minutes of face-to-face and non-face-to-face time with patient.  This included previsit chart review including review of recent hospitalization, lab review, study review, electronic health record documentation, patient education regarding recent stroke including possible etiology, secondary stroke prevention measures and importance of managing stroke risk factors, surgical procedure and clearance and answered all other questions to patient satisfaction   Frann Rider, AGNP-BC  [facility] [address] [place], [place] [address]  Phone [phone] Fax [phone] Note: This document was prepared with digital dictation and possible smart phrase technology. Any transcriptional errors that result from this process are unintentional.

## 2022-05-31 ENCOUNTER — Ambulatory Visit: Payer: BC Managed Care – PPO | Admitting: Adult Health

## 2022-06-04 ENCOUNTER — Other Ambulatory Visit: Payer: Self-pay | Admitting: Gastroenterology

## 2022-06-05 ENCOUNTER — Encounter: Payer: Self-pay | Admitting: Gastroenterology

## 2022-06-05 NOTE — Progress Notes (Unsigned)
[facility] [address] [place]. [place] [address] [phone]       STROKE FOLLOW UP NOTE  [name] Date of Birth:  29-Jan-1981 Medical Record Number:  [id number]   Reason for Referral: stroke follow up   SUBJECTIVE:   CHIEF COMPLAINT:  Chief Complaint  Patient presents with   Room 3    Pt is here Alone. Pt states no new symptoms since last appointment. Pt states that she is still having pain in her left leg.     HPI:   Update 05/31/2022 JM: Patient returns for 39-monthstroke follow-up.  Overall stable from stroke standpoint without new or reoccurring stroke/TIA symptoms.  Remains on Plavix and atorvastatin and Zetia.  Blood pressure well controlled at home, typically 130s/90s.  Routinely follows with PCP.  She continues to have left leg pain and more recently low back pain, is being seen by Ortho for SI joint issues (per patient) and currently working with PT, per patient if no improvement with PT then plans on pursuing imaging for further evaluation, has f/u with Ortho on 1/9 (unable to view via epic).  No further concerns at this time.    History provided for reference purposes only Initial visit 11/24/2021 JM: Patient is being seen for initial hospital follow-up.  She was previously seen in office 03/05/2019 for initial hospital stroke follow-up but did not follow-up after that time. She does have occasional left leg pain which she reports started shortly after discharge, was started on gabapentin by PCP which she uses as needed, has been getting better. No new stroke/TIA symptoms.  Has returned back to all prior activities.  Completed 3 weeks DAPT, remains on Plavix alone as well as atorvastatin, denies side effects.  Blood pressure today 146/102, monitors at home and has been running on higher side of normal more recently.  She believes this is more due to her anxiety regarding her current health.  Shortly before her TIA, she was found to have a  rectal tumor and was scheduled for surgical removal on 5/25 but this was canceled in setting of recent TIA.  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  She is awaiting to undergo CT abdomen/pelvis to ensure no evidence of lymphadenopathy. She questions at what point can Plavix be held to undergo removal.  She was able to complete about 1 to 2 weeks of cardiac monitor but had difficulty and monitor consistently said poor skin contact.  She has not yet sent back monitor. Remaining hypercoagulable labs pending at discharge were largely unremarkable.  No further concerns at this time.  Stroke admission 10/10/2021 Maria Nicastrois a 41 year old female with history of hypertension, hyperlipidemia, former smoker, OSA, hx of R pontine stroke in 11/2018 without residual deficit who presented on 10/10/2021 with left-sided weakness, and decreased level of consciousness.  Personally reviewed hospitalization pertinent progress notes, lab work and imaging.  Evaluated by Dr. XErlinda Hong  CT no acute abnormality.  Status post TNK.  CTA head and neck showed right P2 moderate stenosis.  MRI no acute infarct.  EF 60 to 65%, LDL 71, A1c 5.3.  DVT negative.  Hypercoag work-up pending at discharge.  Creatinine 1.20, ESR 30, UA WBC 21-50.  Evaluated by Dr. XErlinda Hong exam neurologically intact without any deficit.  Symptoms possibly TIA vs aborted stroke s/p TNK although etiology not quite clear therefore recommended 30-day cardiac event monitor to rule out A-fib.  Recommended DAPT for 3 weeks and Plavix alone and continue atorvastatin 40  mg daily. Discharged home without therapy needs.     PERTINENT IMAGING  MR BRAIN WO CONTRAST 10/11/2021 IMPRESSION: 1. No acute intracranial abnormality. 2. Chronic pontine infarct.  CTA HEAD/NECK 10/10/2021 IMPRESSION: 1. No emergent large vessel occlusion. 2. Moderate focal stenosis of the right P2 segment, new since 2020. Otherwise, patent vasculature of the head and neck.  CT  HEAD 10/10/2021 IMPRESSION: 1. No acute intracranial pathology. 2. ASPECTS is 10  2D ECHO 10/11/2021 IMPRESSIONS   1. Left ventricular ejection fraction, by estimation, is 60 to 65%. The  left ventricle has normal function. The left ventricle has no regional  wall motion abnormalities. There is moderate concentric left ventricular  hypertrophy. Left ventricular  diastolic parameters are consistent with Grade I diastolic dysfunction  (impaired relaxation).   2. Right ventricular systolic function is normal. The right ventricular  size is normal. Tricuspid regurgitation signal is inadequate for assessing  PA pressure.   3. The mitral valve is grossly normal. No evidence of mitral valve  regurgitation. No evidence of mitral stenosis.   4. The aortic valve is tricuspid. Aortic valve regurgitation is not  visualized. No aortic stenosis is present.   5. The inferior vena cava is normal in size with greater than 50%  respiratory variability, suggesting right atrial pressure of 3 mmHg.        ROS:   14 system review of systems performed and negative with exception of those listed in HPI  PMH:  Past Medical History:  Diagnosis Date   Anxiety    Deviated septum    GERD (gastroesophageal reflux disease)    History of bilateral salpingectomy    Hyperlipidemia    Hypertension    Renal stones    Sleep apnea    Doesn't use everyday   Stroke [facility])    Thyroid disease     PSH:  Past Surgical History:  Procedure Laterality Date   BILATERAL SALPINGECTOMY  07.2016   CYSTOSCOPY WITH RETROGRADE PYELOGRAM, URETEROSCOPY AND STENT PLACEMENT Right 07/29/2015   Procedure: CYSTOSCOPY WITH RIGHT RETROGRADE PYELOGRAM, RIGHT URETEROSCOPY AND STENT PLACEMENT;  Surgeon: Irine Seal, MD;  Location: [facility];  Service: Urology;  Laterality: Right;   ECTOPIC PREGNANCY SURGERY  10/2013   ENDOSCOPIC MUCOSAL RESECTION  02/13/2022   Procedure: ENDOSCOPIC MUCOSAL RESECTION;  Surgeon: Irving Copas.,  MD;  Location: [facility];  Service: Gastroenterology;;   EUS N/A 02/13/2022   Procedure: LOWER ENDOSCOPIC ULTRASOUND (EUS);  Surgeon: Irving Copas., MD;  Location: [facility];  Service: Gastroenterology;  Laterality: N/A;   FLEXIBLE SIGMOIDOSCOPY N/A 02/13/2022   Procedure: FLEXIBLE SIGMOIDOSCOPY;  Surgeon: Rush Landmark Telford Nab., MD;  Location: [facility];  Service: Gastroenterology;  Laterality: N/A;   HEMOSTASIS CLIP PLACEMENT  02/13/2022   Procedure: HEMOSTASIS CLIP PLACEMENT;  Surgeon: Irving Copas., MD;  Location: [facility];  Service: Gastroenterology;;   HOLMIUM LASER APPLICATION Right 07/01/1094   Procedure: HOLMIUM LASER APPLICATION;  Surgeon: Irine Seal, MD;  Location: [facility];  Service: Urology;  Laterality: Right;   NASAL SEPTUM SURGERY     doen along with tonsillectomy    SUBMUCOSAL LIFTING INJECTION  02/13/2022   Procedure: SUBMUCOSAL LIFTING INJECTION;  Surgeon: Rush Landmark Telford Nab., MD;  Location: [facility];  Service: Gastroenterology;;   TONSILLECTOMY     TYMPANOSTOMY     x 3    Social History:  Social History   Socioeconomic History   Marital status: Married    Spouse name: Not on file   Number of children:  Not on file   Years of education: Not on file   Highest education level: Not on file  Occupational History   Not on file  Tobacco Use   Smoking status: Former    Packs/day: 0.25    Years: 14.00    Total pack years: 3.50    Types: Cigarettes    Start date: 10/04/1997    Quit date: 03/26/2020    Years since quitting: 2.1   Smokeless tobacco: Never  Vaping Use   Vaping Use: Some days  Substance and Sexual Activity   Alcohol use: No   Drug use: Never   Sexual activity: Yes    Birth control/protection: None, Surgical  Other Topics Concern   Not on file  Social History Narrative   Not on file   Social Determinants of Health   Financial Resource Strain: Not on file  Food Insecurity: Not on file  Transportation Needs: Not  on file  Physical Activity: Not on file  Stress: Not on file  Social Connections: Not on file  Intimate Partner Violence: Not on file    Family History:  Family History  Problem Relation Age of Onset   Hypertension Mother    Hypertension Father    Hyperlipidemia Father    Diabetes Brother    Diabetes Other     Medications:   Current Outpatient Medications on File Prior to Visit  Medication Sig Dispense Refill   ALPRAZolam (XANAX) 1 MG tablet Take 0.5-1 tablets by mouth 3 (three) times daily as needed for anxiety.  1   amLODipine (NORVASC) 10 MG tablet Take 10 mg by mouth daily.     clopidogrel (PLAVIX) 75 MG tablet Take 1 tablet (75 mg total) by mouth daily. 30 tablet 1   ezetimibe (ZETIA) 10 MG tablet Take 10 mg by mouth daily.     gabapentin (NEURONTIN) 300 MG capsule Take 300 mg by mouth daily as needed (nerve pain).     levothyroxine (SYNTHROID) 75 MCG tablet Take 75 mcg by mouth daily before breakfast.     losartan-hydrochlorothiazide (HYZAAR) 100-12.5 MG tablet TAKE 1 TABLET BY MOUTH EVERY DAY 30 tablet 1   ondansetron (ZOFRAN-ODT) 8 MG disintegrating tablet Take 8 mg by mouth every 8 (eight) hours as needed for nausea/vomiting.     pantoprazole (PROTONIX) 40 MG tablet TAKE 1 TABLET BY MOUTH TWICE DAILY BEFORE BREAKFAST AND DINNER (Patient taking differently: Take 80 mg by mouth daily.) 180 tablet 0   propranolol ER (INDERAL LA) 60 MG 24 hr capsule Take 60 mg by mouth daily.     sertraline (ZOLOFT) 100 MG tablet Take 100 mg by mouth daily.     tiZANidine (ZANAFLEX) 4 MG capsule Take 4 mg by mouth in the morning and at bedtime.     atorvastatin (LIPITOR) 80 MG tablet Take 80 mg by mouth daily.     dicyclomine (BENTYL) 20 MG tablet Take 1 tablet (20 mg total) by mouth every 6 (six) hours. (Patient not taking: Reported on 06/06/2022) 10 tablet 0   ondansetron (ZOFRAN ODT) 4 MG disintegrating tablet Take 1 tablet (4 mg total) by mouth every 6 (six) hours as needed for nausea or  vomiting. (Patient not taking: Reported on 02/07/2022) 120 tablet 2   No current facility-administered medications on file prior to visit.    Allergies:   Allergies  Allergen Reactions   Hydrocodeine [Dihydrocodeine] Shortness Of Breath and Nausea And Vomiting   Hydrocodone-Acetaminophen Shortness Of Breath and Nausea Only   Coreg [Carvedilol]  Headache    Labetalol Other (See Comments)    Head itches      OBJECTIVE:  Physical Exam  Vitals:   06/06/22 1017  BP: (!) 166/106  Pulse: 71  Weight: 198 lb (89.8 kg)  Height: _0  (1.676 m)   Body mass index is 31.96 kg/m. No results found.   General: well developed, well nourished, pleasant middle-age Caucasian female, seated, in no evident distress Head: head normocephalic and atraumatic.   Neck: supple with no carotid or supraclavicular bruits Cardiovascular: regular rate and rhythm, no murmurs Musculoskeletal: no deformity Skin:  no rash/petichiae Vascular:  Normal pulses all extremities   Neurologic Exam Mental Status: Awake and fully alert.  Fluent speech and language.  Oriented to place and time. Recent and remote memory intact. Attention span, concentration and fund of knowledge appropriate. Mood and affect appropriate.  Cranial Nerves: FPupils equal, briskly reactive to light. Extraocular movements full without nystagmus. Visual fields full to confrontation. Hearing intact. Facial sensation intact. Face, tongue, palate moves normally and symmetrically.  Motor: Normal bulk and tone. Normal strength in all tested extremity muscles Sensory.: intact to touch , pinprick , position and vibratory sensation.  Coordination: Rapid alternating movements normal in all extremities. Finger-to-nose and heel-to-shin performed accurately bilaterally. Gait and Station: Arises from chair without difficulty. Stance is normal. Gait demonstrates normal stride length and balance without use of AD. Tandem walk and heel toe without  difficulty.  Reflexes: 1+ and symmetric. Toes downgoing.         ASSESSMENT: Maria Lang is a 41 y.o. year old female with recent likely TIA s/p TNK on 10/11/2021. Vascular risk factors include hx of R pontine stroke 11/2018 without residual deficit, HTN, HLD, hx of OSA, obesity and hypothyroidism.     PLAN:  TIA Hx of R Pontine stroke:  Continue clopidogrel 75 mg daily  and atorvastatin 40 mg daily for secondary stroke prevention.   Hypercoagulable labs within normal limits Cardiac monitor negative for atrial fibrillation Discussed secondary stroke prevention measures and importance of close PCP follow up for aggressive stroke risk factor management including BP goal<130/90, and HLD with LDL goal<70  I have gone over the pathophysiology of stroke, warning signs and symptoms, risk factors and their management in some detail with instructions to go to the closest emergency room for symptoms of concern.    Doing well from stroke standpoint without further recommendations and risk factors are managed by PCP. She may follow up PRN, as usual for our patients who are strictly being followed for stroke. If any new neurological issues should arise, request PCP place referral for evaluation by one of our neurologists. Thank you.     CC:  PCP: Jillyn Hidden, PA-C    I spent 31 minutes of face-to-face and non-face-to-face time with patient.  This included previsit chart review including review of recent hospitalization, lab review, study review, electronic health record documentation, patient education above diagnoses and treatment plan and answered all the questions to patient's satisfaction   Frann Rider, [facility]  [facility] [address] [place], [place] [address]  Phone [phone] Fax [phone] Note: This document was prepared with digital dictation and possible smart phrase technology. Any transcriptional errors that result from this  process are unintentional.

## 2022-06-06 ENCOUNTER — Ambulatory Visit (INDEPENDENT_AMBULATORY_CARE_PROVIDER_SITE_OTHER): Payer: BC Managed Care – PPO | Admitting: Adult Health

## 2022-06-06 ENCOUNTER — Encounter: Payer: Self-pay | Admitting: Adult Health

## 2022-06-06 VITALS — BP 166/106 | HR 71 | Ht 66.0 in | Wt 198.0 lb

## 2022-06-06 DIAGNOSIS — G459 Transient cerebral ischemic attack, unspecified: Secondary | ICD-10-CM

## 2022-06-06 DIAGNOSIS — I6350 Cerebral infarction due to unspecified occlusion or stenosis of unspecified cerebral artery: Secondary | ICD-10-CM

## 2022-06-06 NOTE — Patient Instructions (Addendum)
Continue to follow with orthopedics for low back and leg pain, continue working with PT  Continue clopidogrel 75 mg daily  and atorvastatin  for secondary stroke prevention  Continue to follow up with PCP regarding cholesterol and blood pressure management  Maintain strict control of hypertension with blood pressure goal below 130/90 and cholesterol with LDL cholesterol (bad cholesterol) goal below 70 mg/dL.   Signs of a Stroke? Follow the BEFAST method:  Balance Watch for a sudden loss of balance, trouble with coordination or vertigo Eyes Is there a sudden loss of vision in one or both eyes? Or double vision?  Face: Ask the person to smile. Does one side of the face droop or is it numb?  Arms: Ask the person to raise both arms. Does one arm drift downward? Is there weakness or numbness of a leg? Speech: Ask the person to repeat a simple phrase. Does the speech sound slurred/strange? Is the person confused ? Time: If you observe any of these signs, call [phone].       Thank you for coming to see [place] at [facility]. I hope we have been able to provide you high quality care today.  You may receive a patient satisfaction survey over the next few weeks. We would appreciate your feedback and comments so that we may continue to improve ourselves and the health of our patients.

## 2022-07-03 ENCOUNTER — Other Ambulatory Visit: Payer: Self-pay | Admitting: Cardiology

## 2022-07-05 ENCOUNTER — Ambulatory Visit: Payer: BC Managed Care – PPO | Admitting: Gastroenterology

## 2022-08-01 ENCOUNTER — Other Ambulatory Visit: Payer: Self-pay | Admitting: Cardiology

## 2022-09-07 ENCOUNTER — Emergency Department (HOSPITAL_COMMUNITY)
Admission: EM | Admit: 2022-09-07 | Discharge: 2022-09-07 | Disposition: A | Discharge status: Home / Self Care | Payer: BC Managed Care – PPO | Attending: Emergency Medicine | Admitting: Emergency Medicine

## 2022-09-07 ENCOUNTER — Emergency Department (HOSPITAL_COMMUNITY): Payer: BC Managed Care – PPO

## 2022-09-07 ENCOUNTER — Encounter (HOSPITAL_COMMUNITY): Payer: Self-pay

## 2022-09-07 ENCOUNTER — Other Ambulatory Visit: Payer: Self-pay

## 2022-09-07 DIAGNOSIS — Z79899 Other long term (current) drug therapy: Secondary | ICD-10-CM | POA: Diagnosis not present

## 2022-09-07 DIAGNOSIS — I10 Essential (primary) hypertension: Secondary | ICD-10-CM | POA: Insufficient documentation

## 2022-09-07 DIAGNOSIS — J101 Influenza due to other identified influenza virus with other respiratory manifestations: Secondary | ICD-10-CM | POA: Insufficient documentation

## 2022-09-07 DIAGNOSIS — R051 Acute cough: Secondary | ICD-10-CM | POA: Diagnosis present

## 2022-09-07 DIAGNOSIS — Z1152 Encounter for screening for COVID-19: Secondary | ICD-10-CM | POA: Diagnosis not present

## 2022-09-07 DIAGNOSIS — Z8673 Personal history of transient ischemic attack (TIA), and cerebral infarction without residual deficits: Secondary | ICD-10-CM | POA: Insufficient documentation

## 2022-09-07 LAB — RESP PANEL BY RT-PCR (RSV, FLU A&B, COVID)  RVPGX2
Influenza A by PCR: NEGATIVE
Influenza B by PCR: POSITIVE — AB
Resp Syncytial Virus by PCR: NEGATIVE
SARS Coronavirus 2 by RT PCR: NEGATIVE

## 2022-09-07 LAB — GROUP A STREP BY PCR: Group A Strep by PCR: NOT DETECTED

## 2022-09-07 MED ORDER — IPRATROPIUM-ALBUTEROL 0.5-2.5 (3) MG/3ML IN SOLN
3.0000 mL | Freq: Once | RESPIRATORY_TRACT | Status: AC
Start: 2022-09-07 — End: 2022-09-07
  Administered 2022-09-07: 3 mL via RESPIRATORY_TRACT
  Filled 2022-09-07: qty 3

## 2022-09-07 MED ORDER — PREDNISONE 50 MG PO TABS: 50.0000 mg | ORAL_TABLET | Freq: Every day | ORAL | 0 refills | Status: DC

## 2022-09-07 MED ORDER — ALBUTEROL SULFATE HFA 108 (90 BASE) MCG/ACT IN AERS
2.0000 | INHALATION_SPRAY | RESPIRATORY_TRACT | 0 refills | Status: AC | PRN
Start: 2022-09-07 — End: ?

## 2022-09-07 MED ORDER — PREDNISONE 50 MG PO TABS
60.0000 mg | ORAL_TABLET | Freq: Once | ORAL | Status: AC
  Administered 2022-09-07: 60 mg via ORAL
  Filled 2022-09-07: qty 1

## 2022-09-07 NOTE — ED Triage Notes (Addendum)
Pt presents with fever, cough, sob, chills, sore throat since Monday. Pt test negative for strep and covid. Hx of CHF. Pt cough sounds productive and congested.

## 2022-09-07 NOTE — ED Provider Notes (Signed)
[facility] Provider Note   CSN: [id number] Arrival date & time: 09/07/22  0440     History  Chief Complaint  Patient presents with   Fever   Cough    Maria Lang is a 42 y.o. female.  The history is provided by the patient.  Fever Associated symptoms: cough   Cough Associated symptoms: fever   She has history of hypertension, hyperlipidemia, stroke, GERD and comes in with cough for the last 3 days which has been worse over the last 24 hours.  There has been associated sore throat.  She has had some chills but no objective fevers.  She has had some sweats.  She denies any nausea, vomiting, diarrhea.  She denies arthralgias or myalgias.  She has not had any sick contacts.  She went to her primary care provider where strep screen was negative.   Home Medications Prior to Admission medications   Medication Sig Start Date End Date Taking? Authorizing Provider  ALPRAZolam Duanne Moron) 1 MG tablet Take 0.5-1 tablets by mouth 3 (three) times daily as needed for anxiety. 01/01/17   [provider]  amLODipine (NORVASC) 10 MG tablet Take 10 mg by mouth daily.    [provider]  atorvastatin (LIPITOR) 80 MG tablet Take 80 mg by mouth daily.    [provider]  clopidogrel (PLAVIX) 75 MG tablet Take 1 tablet (75 mg total) by mouth daily. 02/15/22   Mansouraty, Telford Nab., MD  dicyclomine (BENTYL) 20 MG tablet Take 1 tablet (20 mg total) by mouth every 6 (six) hours. Patient not taking: Reported on 06/06/2022 02/13/22   Mansouraty, Telford Nab., MD  ezetimibe (ZETIA) 10 MG tablet Take 10 mg by mouth daily. 11/14/21   [provider]  gabapentin (NEURONTIN) 300 MG capsule Take 300 mg by mouth daily as needed (nerve pain). 10/18/21   [provider]  levothyroxine (SYNTHROID) 75 MCG tablet Take 75 mcg by mouth daily before breakfast. 02/28/19   [provider]  losartan-hydrochlorothiazide (HYZAAR)  100-12.5 MG tablet TAKE 1 TABLET BY MOUTH EVERY DAY 07/03/22   Arnoldo Lenis, MD  ondansetron (ZOFRAN ODT) 4 MG disintegrating tablet Take 1 tablet (4 mg total) by mouth every 6 (six) hours as needed for nausea or vomiting. Patient not taking: Reported on 02/07/2022 09/07/21   Zehr, Janett Billow D, PA-C  ondansetron (ZOFRAN-ODT) 8 MG disintegrating tablet Take 8 mg by mouth every 8 (eight) hours as needed for nausea/vomiting. 01/10/22   [provider]  pantoprazole (PROTONIX) 40 MG tablet TAKE 1 TABLET BY MOUTH TWICE DAILY BEFORE BREAKFAST AND DINNER Patient taking differently: Take 80 mg by mouth daily. 11/04/18   Ladene Artist, MD  propranolol ER (INDERAL LA) 60 MG 24 hr capsule Take 60 mg by mouth daily.    [provider]  sertraline (ZOLOFT) 100 MG tablet Take 100 mg by mouth daily. 10/01/18   [provider]  tiZANidine (ZANAFLEX) 4 MG capsule Take 4 mg by mouth in the morning and at bedtime.    [provider]      Allergies    Hydrocodeine [dihydrocodeine], Hydrocodone-acetaminophen, Coreg [carvedilol], and Labetalol    Review of Systems   Review of Systems  Constitutional:  Positive for fever.  Respiratory:  Positive for cough.   All other systems reviewed and are negative.   Physical Exam Updated Vital Signs BP (!) 173/108   Pulse 95   Temp 98.2 F (36.8 C)  Resp 19   Wt 88.5 kg   SpO2 97%   BMI 31.47 kg/m  Physical Exam Vitals and nursing note reviewed.   42 year old female, resting comfortably and in no acute distress. Vital signs are significant for a blood pressure. Oxygen saturation is 97%, which is normal. Head is normocephalic and atraumatic. PERRLA, EOMI. Oropharynx is clear. Neck is nontender and supple without adenopathy or JVD. Back is nontender and there is no CVA tenderness. Lungs have coarse expiratory wheezes diffusely.  There are no rales. Chest is nontender. Heart has regular rate and rhythm without murmur. Abdomen  is soft, flat, nontender. Extremities have no cyanosis or edema, full range of motion is present. Skin is warm and dry without rash. Neurologic: Mental status is normal, cranial nerves are intact, moves all extremities equally.  ED Results / Procedures / Treatments   Labs (all labs ordered are listed, but only abnormal results are displayed) Labs Reviewed  RESP PANEL BY RT-PCR (RSV, FLU A&B, COVID)  [id number]  GROUP A STREP BY PCR   Radiology No results found.  Procedures Procedures    Medications Ordered in ED Medications  predniSONE (DELTASONE) tablet 60 mg (has no administration in time range)  ipratropium-albuterol (DUONEB) 0.5-2.5 (3) MG/3ML nebulizer solution 3 mL (3 mLs Nebulization Given 09/07/22 0537)    ED Course/ Medical Decision Making/ A&P                             Medical Decision Making Amount and/or Complexity of Data Reviewed Radiology: ordered.  Risk Prescription drug management.   Cough which likely is part of an infectious process such as bronchitis or pneumonia.  Consider influenza, RSV, COVID-19.  Consider streptococcal infection.  I have ordered checks x-ray to rule out pneumonia, PCR test for strep and respiratory pathogens.  I have ordered a nebulizer treatment with albuterol and ipratropium.  Chest x-ray shows no evidence of pneumonia.  I have independently viewed the images, and agree with radiologist interpretation.  I have reviewed and interpreted her laboratory tests and my interpretation is positive PCR for influenza B, negative PCR for strep, influenza A, RSV, COVID-19.  She had moderate improvement with nebulizer treatment.  On reexam, lungs are clear.  I am discharging her with prescription for albuterol inhaler and a 5-day course of prednisone.  I have ordered an initial dose of prednisone in the emergency department.  Unfortunately, she is outside the treatment window for influenza, she is not a candidate for antiviral treatment.  Final  Clinical Impression(s) / ED Diagnoses Final diagnoses:  Influenza B  Acute cough    Rx / DC Orders ED Discharge Orders          Ordered    albuterol (VENTOLIN HFA) 108 (90 Base) MCG/ACT inhaler  Every 4 hours PRN        09/07/22 0628    predniSONE (DELTASONE) 50 MG tablet  Daily        09/07/22 [id number]              Delora Fuel, MD [id number]

## 2022-09-18 ENCOUNTER — Telehealth: Payer: BC Managed Care – PPO | Admitting: Physician Assistant

## 2022-09-18 DIAGNOSIS — B9689 Other specified bacterial agents as the cause of diseases classified elsewhere: Secondary | ICD-10-CM | POA: Diagnosis not present

## 2022-09-18 DIAGNOSIS — J208 Acute bronchitis due to other specified organisms: Secondary | ICD-10-CM

## 2022-09-18 MED ORDER — AZITHROMYCIN 250 MG PO TABS: ORAL_TABLET | ORAL | 0 refills | Status: AC

## 2022-09-18 MED ORDER — PROMETHAZINE-DM 6.25-15 MG/5ML PO SYRP: 5.0000 mL | ORAL_SOLUTION | Freq: Four times a day (QID) | ORAL | 0 refills | Status: DC | PRN

## 2022-09-18 NOTE — Progress Notes (Signed)
E-Visit for Cough  We are sorry that you are not feeling well.  Here is how we plan to help!  Based on your presentation I believe you most likely have A cough due to bacteria.  When patients have a fever and a productive cough with a change in color or increased sputum production, we are concerned about bacterial bronchitis.  If left untreated it can progress to pneumonia.  If your symptoms do not improve with your treatment plan it is important that you contact your provider.   I have prescribed Azithromyin 250 mg: two tablets now and then one tablet daily for 4 additonal days    In addition you may use Promethazine DM cough syrup Take 62mL every 6 hours as needed for cough  From your responses in the eVisit questionnaire you describe inflammation in the upper respiratory tract which is causing a significant cough.  This is commonly called Bronchitis and has four common causes:   Allergies Viral Infections Acid Reflux Bacterial Infection Allergies, viruses and acid reflux are treated by controlling symptoms or eliminating the cause. An example might be a cough caused by taking certain blood pressure medications. You stop the cough by changing the medication. Another example might be a cough caused by acid reflux. Controlling the reflux helps control the cough.  USE OF BRONCHODILATOR ("RESCUE") INHALERS: There is a risk from using your bronchodilator too frequently.  The risk is that over-reliance on a medication which only relaxes the muscles surrounding the breathing tubes can reduce the effectiveness of medications prescribed to reduce swelling and congestion of the tubes themselves.  Although you feel brief relief from the bronchodilator inhaler, your asthma may actually be worsening with the tubes becoming more swollen and filled with mucus.  This can delay other crucial treatments, such as oral steroid medications. If you need to use a bronchodilator inhaler daily, several times per day, you  should discuss this with your provider.  There are probably better treatments that could be used to keep your asthma under control.     HOME CARE Only take medications as instructed by your medical team. Complete the entire course of an antibiotic. Drink plenty of fluids and get plenty of rest. Avoid close contacts especially the very young and the elderly Cover your mouth if you cough or cough into your sleeve. Always remember to wash your hands A steam or ultrasonic humidifier can help congestion.   GET HELP RIGHT AWAY IF: You develop worsening fever. You become short of breath You cough up blood. Your symptoms persist after you have completed your treatment plan MAKE SURE YOU  Understand these instructions. Will watch your condition. Will get help right away if you are not doing well or get worse.    Thank you for choosing an e-visit.  Your e-visit answers were reviewed by a board certified advanced clinical practitioner to complete your personal care plan. Depending upon the condition, your plan could have included both over the counter or prescription medications.  Please review your pharmacy choice. Make sure the pharmacy is open so you can pick up prescription now. If there is a problem, you may contact your provider through [employer] and have the prescription routed to another pharmacy.  Your safety is important to [place]. If you have drug allergies check your prescription carefully.   For the next 24 hours you can use MyChart to ask questions about today's visit, request a non-urgent call back, or ask for a work or school  excuse. You will get an email in the next two days asking about your experience. I hope that your e-visit has been valuable and will speed your recovery.  I have spent 5 minutes in review of e-visit questionnaire, review and updating patient chart, medical decision making and response to patient.   Mar Daring, PA-C

## 2022-10-04 ENCOUNTER — Emergency Department (HOSPITAL_COMMUNITY)
Admission: EM | Admit: 2022-10-04 | Discharge: 2022-10-04 | Disposition: A | Discharge status: Home / Self Care | Payer: BC Managed Care – PPO | Attending: Emergency Medicine | Admitting: Emergency Medicine

## 2022-10-04 ENCOUNTER — Other Ambulatory Visit: Payer: Self-pay

## 2022-10-04 DIAGNOSIS — R197 Diarrhea, unspecified: Secondary | ICD-10-CM | POA: Insufficient documentation

## 2022-10-04 DIAGNOSIS — Z5321 Procedure and treatment not carried out due to patient leaving prior to being seen by health care provider: Secondary | ICD-10-CM | POA: Diagnosis not present

## 2022-10-04 DIAGNOSIS — R112 Nausea with vomiting, unspecified: Secondary | ICD-10-CM | POA: Diagnosis present

## 2022-10-04 LAB — COMPREHENSIVE METABOLIC PANEL
ALT: 26 U/L (ref 0–44)
AST: 17 U/L (ref 15–41)
Albumin: 4.2 g/dL (ref 3.5–5.0)
Alkaline Phosphatase: 84 U/L (ref 38–126)
Anion gap: 10 (ref 5–15)
BUN: 12 mg/dL (ref 6–20)
CO2: 25 mmol/L (ref 22–32)
Calcium: 9.1 mg/dL (ref 8.9–10.3)
Chloride: 104 mmol/L (ref 98–111)
Creatinine, Ser: 0.94 mg/dL (ref 0.44–1.00)
GFR, Estimated: >60 mL/min (ref >60)
Glucose, Bld: 137 mg/dL — ABNORMAL HIGH (ref 70–99)
Potassium: 3.7 mmol/L (ref 3.5–5.1)
Sodium: 139 mmol/L (ref 135–145)
Total Bilirubin: 0.6 mg/dL (ref 0.3–1.2)
Total Protein: 7.7 g/dL (ref 6.5–8.1)

## 2022-10-04 LAB — CBC
HCT: 32.8 % — ABNORMAL LOW (ref 36.0–46.0)
Hemoglobin: 9.7 g/dL — ABNORMAL LOW (ref 12.0–15.0)
MCH: 23.1 pg — ABNORMAL LOW (ref 26.0–34.0)
MCHC: 29.6 g/dL — ABNORMAL LOW (ref 30.0–36.0)
MCV: 78.1 fL — ABNORMAL LOW (ref 80.0–100.0)
Platelets: 363 10*3/uL (ref 150–400)
RBC: 4.2 MIL/uL (ref 3.87–5.11)
RDW: 17.5 % — ABNORMAL HIGH (ref 11.5–15.5)
WBC: 12.6 10*3/uL — ABNORMAL HIGH (ref 4.0–10.5)
nRBC: 0 % (ref 0.0–0.2)

## 2022-10-04 LAB — LIPASE, BLOOD: Lipase: 25 U/L (ref 11–51)

## 2022-10-04 NOTE — ED Triage Notes (Signed)
Pt presents with N/V/D that started yesterday.

## 2022-10-12 ENCOUNTER — Encounter: Payer: Self-pay | Admitting: Neurology

## 2022-10-12 ENCOUNTER — Ambulatory Visit: Payer: BC Managed Care – PPO | Admitting: Neurology

## 2022-10-12 VITALS — BP 151/98 | HR 72 | Ht 67.0 in | Wt 194.2 lb

## 2022-10-12 DIAGNOSIS — Z820 Family history of epilepsy and other diseases of the nervous system: Secondary | ICD-10-CM

## 2022-10-12 DIAGNOSIS — G3184 Mild cognitive impairment, so stated: Secondary | ICD-10-CM

## 2022-10-12 DIAGNOSIS — R413 Other amnesia: Secondary | ICD-10-CM | POA: Diagnosis not present

## 2022-10-12 NOTE — Progress Notes (Signed)
[facility] [address] [place]. [place] [address] [phone]       OFFICE CONSULT NOTE  [name] Date of Birth:  01-Feb-1981 Medical Record Number:  [id number]   Referring MD:  [place] Zandel, PA-c  Reason for Referral:  Memory loss  HPI: Maria Lang is a 42 year old Hispanic lady seen today for initial office consultation visit for memory and speech difficulties.  History is obtained from the patient and review of electronic medical records and I personally reviewed pertinent available imaging films in PACS.  She has past medical history of hypertension, hyperlipidemia, sleep apnea, GERD, right pontine stroke without residual deficits and strokelike episode in April 2023 with left-sided weakness treated with TNK. Patient states that she does notice mild memory difficulties and speech difficulties which seem to have gotten worse for the last few months.  She has word finding difficulties and often gets stuck in sentences and has trouble getting words out.  She has some good days and bad days.  She does have a history of longstanding anxiety but she feels it is well-controlled on the current medication regimen of Zoloft and Xanax as needed.  She denies any increased stressors or increase in anxiety.  She is fully independent in activities of daily living except she is scared to use dose to during the day as she may forget to turn it off so she uses the microwave and only when her husband is at home she cooks on the stove.  She denies any significant headaches, gait or balance problems.  There is no history of significant head injury with loss of consciousness, seizures.  She does have a strong family history of dementia on both sides of the family.  Her dad died of Alzheimer's disease.  She worries about this diagnosis.  She has not had any recent lab work or brain imaging done.  Prior office visits Update 05/31/2022 JM: Patient returns for 32-month stroke follow-up.   Overall stable from stroke standpoint without new or reoccurring stroke/TIA symptoms.  Remains on Plavix and atorvastatin and Zetia.  Blood pressure well controlled at home, typically 130s/90s.  Routinely follows with PCP.  She continues to have left leg pain and more recently low back pain, is being seen by Ortho for SI joint issues (per patient) and currently working with PT, per patient if no improvement with PT then plans on pursuing imaging for further evaluation, has f/u with Ortho on 1/9 (unable to view via epic).  No further concerns at this time.       History provided for reference purposes only Initial visit 11/24/2021 JM: Patient is being seen for initial hospital follow-up.  She was previously seen in office 03/05/2019 for initial hospital stroke follow-up but did not follow-up after that time. She does have occasional left leg pain which she reports started shortly after discharge, was started on gabapentin by PCP which she uses as needed, has been getting better. No new stroke/TIA symptoms.  Has returned back to all prior activities.   Completed 3 weeks DAPT, remains on Plavix alone as well as atorvastatin, denies side effects.  Blood pressure today 146/102, monitors at home and has been running on higher side of normal more recently.  She believes this is more due to her anxiety regarding her current health.  Shortly before her TIA, she was found to have a rectal tumor and was scheduled for surgical removal on 5/25 but this was canceled in setting of recent TIA.  Per  note review, likely benign rectal neuroendocrine tumor.  She is awaiting to undergo CT abdomen/pelvis to ensure no evidence of lymphadenopathy. She questions at what point can Plavix be held to undergo removal.  She was able to complete about 1 to 2 weeks of cardiac monitor but had difficulty and monitor consistently said poor skin contact.  She has not yet sent back monitor. Remaining hypercoagulable labs pending at discharge were  largely unremarkable.  No further concerns at this time.   Stroke admission 10/10/2021 Maria Lang is a 42 year old female with history of hypertension, hyperlipidemia, former smoker, OSA, hx of R pontine stroke in 11/2018 without residual deficit who presented on 10/10/2021 with left-sided weakness, and decreased level of consciousness.  Personally reviewed hospitalization pertinent progress notes, lab work and imaging.  Evaluated by Dr. Roda Shutters.  CT no acute abnormality.  Status post TNK.  CTA head and neck showed right P2 moderate stenosis.  MRI no acute infarct.  EF 60 to 65%, LDL 71, A1c 5.3.  DVT negative.  Hypercoag work-up pending at discharge.  Creatinine 1.20, ESR 30, UA WBC 21-50.  Evaluated by Dr. Roda Shutters, exam neurologically intact without any deficit.  Symptoms possibly TIA vs aborted stroke s/p TNK although etiology not quite clear therefore recommended 30-day cardiac event monitor to rule out A-fib.  Recommended DAPT for 3 weeks and Plavix alone and continue atorvastatin 40 mg daily. Discharged home without therapy needs.    ROS:   14 system review of systems is positive for memory loss, cognitive difficulties, speech difficulties, word finding difficulties, anxiety all other systems negative  PMH:  Past Medical History:  Diagnosis Date   Anxiety    Deviated septum    GERD (gastroesophageal reflux disease)    History of bilateral salpingectomy    Hyperlipidemia    Hypertension    Renal stones    Sleep apnea    Doesn't use everyday   Stroke [facility])    Thyroid disease     Social History:  Social History   Socioeconomic History   Marital status: Married    Spouse name: Not on file   Number of children: Not on file   Years of education: Not on file   Highest education level: Not on file  Occupational History   Not on file  Tobacco Use   Smoking status: Former    Packs/day: 0.25    Years: 14.00    Additional pack years: 0.00    Total pack years: 3.50    Types: Cigarettes     Start date: 10/04/1997    Quit date: 03/26/2020    Years since quitting: 2.5   Smokeless tobacco: Never  Vaping Use   Vaping Use: Some days  Substance and Sexual Activity   Alcohol use: No   Drug use: Never   Sexual activity: Yes    Birth control/protection: None, Surgical  Other Topics Concern   Not on file  Social History Narrative   Not on file   Social Determinants of Health   Financial Resource Strain: Not on file  Food Insecurity: Not on file  Transportation Needs: Not on file  Physical Activity: Not on file  Stress: Not on file  Social Connections: Not on file  Intimate Partner Violence: Not on file    Medications:   Current Outpatient Medications on File Prior to Visit  Medication Sig Dispense Refill   ALPRAZolam (XANAX) 1 MG tablet Take 0.5-1 tablets by mouth 3 (three) times daily as needed for anxiety.  1   amLODipine (NORVASC) 10 MG tablet Take 10 mg by mouth daily.     atorvastatin (LIPITOR) 80 MG tablet Take 80 mg by mouth daily.     clopidogrel (PLAVIX) 75 MG tablet Take 1 tablet (75 mg total) by mouth daily. 30 tablet 1   ezetimibe (ZETIA) 10 MG tablet Take 10 mg by mouth daily.     gabapentin (NEURONTIN) 300 MG capsule Take 300 mg by mouth daily as needed (nerve pain).     levothyroxine (SYNTHROID) 75 MCG tablet Take 75 mcg by mouth daily before breakfast.     losartan-hydrochlorothiazide (HYZAAR) 100-12.5 MG tablet TAKE 1 TABLET BY MOUTH EVERY DAY 30 tablet 0   ondansetron (ZOFRAN-ODT) 8 MG disintegrating tablet Take 8 mg by mouth every 8 (eight) hours as needed for nausea/vomiting.     pantoprazole (PROTONIX) 40 MG tablet TAKE 1 TABLET BY MOUTH TWICE DAILY BEFORE BREAKFAST AND DINNER (Patient taking differently: Take 80 mg by mouth daily.) 180 tablet 0   propranolol ER (INDERAL LA) 60 MG 24 hr capsule Take 60 mg by mouth daily.     sertraline (ZOLOFT) 100 MG tablet Take 100 mg by mouth daily.     albuterol (VENTOLIN HFA) 108 (90 Base) MCG/ACT inhaler Inhale 2  puffs into the lungs every 4 (four) hours as needed for wheezing or shortness of breath. (Patient not taking: Reported on 10/12/2022) 1 each 0   dicyclomine (BENTYL) 20 MG tablet Take 1 tablet (20 mg total) by mouth every 6 (six) hours. (Patient not taking: Reported on 06/06/2022) 10 tablet 0   predniSONE (DELTASONE) 50 MG tablet Take 1 tablet (50 mg total) by mouth daily. (Patient not taking: Reported on 10/12/2022) 5 tablet 0   promethazine-dextromethorphan (PROMETHAZINE-DM) 6.25-15 MG/5ML syrup Take 5 mLs by mouth 4 (four) times daily as needed. (Patient not taking: Reported on 10/12/2022) 118 mL 0   tiZANidine (ZANAFLEX) 4 MG capsule Take 4 mg by mouth in the morning and at bedtime. (Patient not taking: Reported on 10/12/2022)     No current facility-administered medications on file prior to visit.    Allergies:   Allergies  Allergen Reactions   Hydrocodeine [Dihydrocodeine] Shortness Of Breath and Nausea And Vomiting   Hydrocodone-Acetaminophen Shortness Of Breath and Nausea Only   Coreg [Carvedilol]     Headache    Labetalol Other (See Comments)    Head itches    Physical Exam General: well developed, well nourished pleasant mildly obese middle-age lady, seated, in no evident distress Head: head normocephalic and atraumatic.   Neck: supple with no carotid or supraclavicular bruits Cardiovascular: regular rate and rhythm, no murmurs Musculoskeletal: no deformity Skin:  no rash/petichiae Vascular:  Normal pulses all extremities  Neurologic Exam Mental Status: Awake and fully alert. Oriented to place and time. Recent and remote memory intact. Attention span, concentration and fund of knowledge appropriate. Mood and affect appropriate.  Diminished recall 2/3.  Able to name 9 animals which can walk on 4 legs.  Clock drawing 4/4.  Mini-Mental status exam score 27/30.  Geriatric depression scale 4 not depressed Cranial Nerves: Fundoscopic exam reveals sharp disc margins. Pupils equal,  briskly reactive to light. Extraocular movements full without nystagmus. Visual fields full to confrontation. Hearing intact. Facial sensation intact. Face, tongue, palate moves normally and symmetrically.  Motor: Normal bulk and tone. Normal strength in all tested extremity muscles. Sensory.: intact to touch , pinprick , position and vibratory sensation.  Coordination: Rapid alternating movements normal in all extremities.  Finger-to-nose and heel-to-shin performed accurately bilaterally. Gait and Station: Arises from chair without difficulty. Stance is normal. Gait demonstrates normal stride length and balance . Able to heel, toe and tandem walk with moderate difficulty.  Reflexes: 1+ and symmetric. Toes downgoing.   NIHSS 0 Modified Rankin 1     10/12/2022    1:27 PM  MMSE - Mini Mental State Exam  Orientation to time 5  Orientation to Place 5  Registration 3  Attention/ Calculation 4  Recall 1  Language- name 2 objects 2  Language- repeat 1  Language- follow 3 step command 3  Language- read & follow direction 1  Write a sentence 1  Copy design 1  Total score 27    ASSESSMENT: 42 year old lady with subacute memory, speech and cognitive difficulties likely from mild cognitive impairment.  Remote history of stroke as well as underlying anxiety depression which may be contributing.  Vascular risk factors of hypertension, hyperlipidemia, previous stroke and mild obesity.     PLAN:I had a long discussion with the patient regarding her new complaints of memory loss and mild cognitive impairment. , prior history of stroke and strong family history of Alzheimer's and answered questions.  I recommend we check memory panel labs, EEG, MRI scan of the brain and apoE for Alzheimer's risk.  I encouraged her to increase participation in cognitively challenging activities like solving crossword puzzles, playing bridge, sudoku and word searches.  We also discussed memory compensation strategies.  I  encouraged her to follow-up with her primary physician for optimal treatment for her anxiety and depression.  Continue Plavix for stroke prevention modification with strict control of hypertension with blood pressure goal below 13090,/lipids with LDL cholesterol goal below 70 mg % and diabetes with hemoglobin A1c goal below 6.5%.[name] Kitchen  He was encouraged to eat a healthy diet with lots of fruits, vegetables, cereals and whole grains diet regularly and lose weight.  She will return for follow-up in the future in 4 months or call earlier if necessary Greater than 50% time during this 45-minute consultation visit were spent on counseling and coordination of care about her memory loss and cognitive impairment as well as remote stroke and answering questions. Delia Heady, MD Note: This document was prepared with digital dictation and possible smart phrase technology. Any transcriptional errors that result from this process are unintentional.

## 2022-10-12 NOTE — Patient Instructions (Signed)
I had a long discussion with the patient regarding her new complaints of memory loss and mild cognitive impairment. , prior history of stroke and strong family history of Alzheimer's and answered questions.  I recommend we check memory panel labs, EEG, MRI scan of the brain and apoE for Alzheimer's risk.  I encouraged her to increase participation in cognitively challenging activities like solving crossword puzzles, playing bridge, sudoku and word searches.  We also discussed memory compensation strategies.  I encouraged her to follow-up with her primary physician for optimal treatment for her anxiety and depression.  Continue Plavix for stroke prevention modification with strict control of hypertension with blood pressure goal below 13090,/lipids with LDL cholesterol goal below 70 mg % and diabetes with hemoglobin A1c goal below 6.5%.[name] Kitchen  He was encouraged to eat a healthy diet with lots of fruits, vegetables, cereals and whole grains diet regularly and lose weight.  She will return for follow-up in the future in 4 months or call earlier if necessary  Memory Compensation Strategies  Use "WARM" strategy.  W= write it down  A= associate it  R= repeat it  M= make a mental note  2.   You can keep a Glass blower/designer.  Use a 3-ring notebook with sections for the following: calendar, important names and phone numbers,  medications, doctors' names/phone numbers, lists/reminders, and a section to journal what you did  each day.   3.    Use a calendar to write appointments down.  4.    Write yourself a schedule for the day.  This can be placed on the calendar or in a separate section of the Memory Notebook.  Keeping a  regular schedule can help memory.  5.    Use medication organizer with sections for each day or morning/evening pills.  You may need help loading it  6.    Keep a basket, or pegboard by the door.  Place items that you need to take out with you in the basket or on the pegboard.  You may also  want to  include a message board for reminders.  7.    Use sticky notes.  Place sticky notes with reminders in a place where the task is performed.  For example: " turn off the  stove" placed by the stove, "lock the door" placed on the door at eye level, " take your medications" on  the bathroom mirror or by the place where you normally take your medications.  8.    Use alarms/timers.  Use while cooking to remind yourself to check on food or as a reminder to take your medicine, or as a  reminder to make a call, or as a reminder to perform another task, etc.

## 2022-10-13 LAB — APOE ALZHEIMER'S RISK

## 2022-10-13 LAB — DEMENTIA PANEL: Vitamin B-12: 467 pg/mL (ref 232–1245)

## 2022-10-13 NOTE — Progress Notes (Signed)
Kindly inform the patient that lab results for reversible causes of memory loss are mostly normal though 2 results are still not back yet.

## 2022-10-17 ENCOUNTER — Ambulatory Visit (INDEPENDENT_AMBULATORY_CARE_PROVIDER_SITE_OTHER): Payer: BC Managed Care – PPO

## 2022-10-17 DIAGNOSIS — R413 Other amnesia: Secondary | ICD-10-CM | POA: Diagnosis not present

## 2022-10-17 MED ORDER — GADOBENATE DIMEGLUMINE 529 MG/ML IV SOLN
15.0000 mL | Freq: Once | INTRAVENOUS | Status: AC | PRN
Start: 2022-10-17 — End: 2022-10-17
  Administered 2022-10-17: 15 mL via INTRAVENOUS

## 2022-10-18 ENCOUNTER — Encounter: Payer: Self-pay | Admitting: Neurology

## 2022-10-19 NOTE — Telephone Encounter (Signed)
Please result when available  

## 2022-10-27 NOTE — Progress Notes (Signed)
Kindly inform the patient that MRI scan of the brain shows old small stroke in the lower portion of the brain in the back which is unchanged from previous MRI from 2023.  She has changes of sinusitis which appear more progressed compared to the previous MRI.  If she has symptoms of sinusitis she needs to see primary care physician for evaluation and treatment.

## 2022-10-27 NOTE — Progress Notes (Signed)
Kindly inform the patient that all lab results for reversible causes of memory loss are back and are normal

## 2022-10-30 NOTE — Telephone Encounter (Signed)
Called patient and inform the patient that all lab results for reversible causes of memory loss are back and are normal. Pt verbalized understanding. Pt had no questions at this time but was encouraged to call back if questions arise.

## 2022-10-30 NOTE — Telephone Encounter (Signed)
-----   Message from Katherine C Martin, RN sent at 10/30/2022  9:30 AM EDT -----  ----- Message ----- From: Sethi, Pramod S, MD Sent: 10/27/2022   8:00 AM EDT To: Gna-Pod 2 Results  Kindly inform the patient that all lab results for reversible causes of memory loss are back and are normal  

## 2022-10-30 NOTE — Telephone Encounter (Signed)
-----   Message from Deatra James, RN sent at 10/30/2022  9:30 AM EDT -----  ----- Message ----- From: Micki Riley, MD Sent: 10/27/2022   8:00 AM EDT To: Gna-Pod 2 Results  Kindly inform the patient that all lab results for reversible causes of memory loss are back and are normal

## 2022-10-31 ENCOUNTER — Ambulatory Visit: Payer: BC Managed Care – PPO | Admitting: Neurology

## 2022-10-31 DIAGNOSIS — R413 Other amnesia: Secondary | ICD-10-CM

## 2022-10-31 DIAGNOSIS — R4182 Altered mental status, unspecified: Secondary | ICD-10-CM

## 2022-11-01 LAB — DEMENTIA PANEL
Homocysteine: 10.2 umol/L (ref 0.0–14.5)
RPR Ser Ql: NONREACTIVE
TSH: 3.68 u[IU]/mL (ref 0.450–4.500)

## 2022-11-01 LAB — APOE ALZHEIMER'S RISK

## 2022-11-10 NOTE — Progress Notes (Signed)
Kindly inform the patient that blood test for APO e suggest that she has 1 copy of the gene which does put her at increased risk for Alzheimer's but most patients with this do not develop Alzheimer's late in life

## 2022-11-10 NOTE — Progress Notes (Signed)
Kindly inform the patient that EEG or brainwave study was normal.  No seizure activity noted.

## 2022-11-13 ENCOUNTER — Telehealth: Payer: Self-pay

## 2022-11-13 NOTE — Telephone Encounter (Signed)
Called pt to inform the her that the EEG or brainwave study was normal.  No seizure activity noted. Pt verbalized understanding. Pt had no questions at this time but was encouraged to call back if questions arise.

## 2022-11-13 NOTE — Telephone Encounter (Signed)
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

## 2022-11-13 NOTE — Telephone Encounter (Signed)
-----   Message from Deatra James, RN sent at 11/13/2022  7:39 AM EDT -----  ----- Message ----- From: Micki Riley, MD Sent: 11/10/2022   1:42 PM EDT To: Gna-Pod 2 Results  Kindly inform the patient that EEG or brainwave study was normal.  No seizure activity noted.

## 2022-11-21 ENCOUNTER — Institutional Professional Consult (permissible substitution): Payer: BC Managed Care – PPO | Admitting: Neurology

## 2022-12-05 ENCOUNTER — Institutional Professional Consult (permissible substitution): Payer: BC Managed Care – PPO | Admitting: Neurology

## 2023-03-08 ENCOUNTER — Telehealth: Payer: Self-pay | Admitting: Gastroenterology

## 2023-03-08 NOTE — Telephone Encounter (Signed)
PT is calling to schedule EUS. She would like to have it done ASAP because she has almost deductible and needs it scheduled this month or early October. Please advise.

## 2023-03-09 ENCOUNTER — Other Ambulatory Visit: Payer: Self-pay

## 2023-03-09 DIAGNOSIS — D3A8 Other benign neuroendocrine tumors: Secondary | ICD-10-CM

## 2023-03-09 DIAGNOSIS — D128 Benign neoplasm of rectum: Secondary | ICD-10-CM

## 2023-03-09 NOTE — Telephone Encounter (Signed)
[facility] EUS has been set up for 05/07/23 at 915 am at [facility] with [facility]   EUS scheduled, pt instructed and medications reviewed.  Patient instructions mailed to home.  Patient to call with any questions or concerns.   I agree we should wait atleast 3 months unless it is deemed an emergency. Ok to hold plavix for 3-5 days prior to procedure with small but acceptable periprocedural risk of stroke/TIA if patient is willing     Mansouraty, Netty Starring., MD  Ihor Austin, NP; Micki Riley, MD; Meryl Dare, MD 3 days ago

## 2023-03-20 DIAGNOSIS — Z0289 Encounter for other administrative examinations: Secondary | ICD-10-CM

## 2023-03-27 NOTE — Telephone Encounter (Signed)
Patient called to get advise, stated she is having issues with hemorrhoids.

## 2023-03-28 NOTE — Telephone Encounter (Signed)
The pt states that she has had some constipation with small hard stools.  She has been straining to have a BM.  She has taken Miralax in the past but has not taken it in several months/a year.  She will begin taking miralax daily and drink plenty of fluids. She has also been advised to use Prep H and do sitz baths as often as able. She will also use tucks pads.  She will call back and update if these recommendations do not help.

## 2023-04-21 ENCOUNTER — Telehealth: Payer: BC Managed Care – PPO | Admitting: Family Medicine

## 2023-04-21 DIAGNOSIS — N390 Urinary tract infection, site not specified: Secondary | ICD-10-CM | POA: Diagnosis not present

## 2023-04-21 DIAGNOSIS — N3289 Other specified disorders of bladder: Secondary | ICD-10-CM

## 2023-04-21 MED ORDER — SULFAMETHOXAZOLE-TRIMETHOPRIM 800-160 MG PO TABS: 1.0000 | ORAL_TABLET | Freq: Two times a day (BID) | ORAL | 0 refills | Status: AC

## 2023-04-21 NOTE — Patient Instructions (Signed)
Urinary Tract Infection, Adult  A urinary tract infection (UTI) is an infection of any part of the urinary tract. The urinary tract includes the kidneys, ureters, bladder, and urethra. These organs make, store, and get rid of urine in the body. An upper UTI affects the ureters and kidneys. A lower UTI affects the bladder and urethra. What are the causes? Most urinary tract infections are caused by bacteria in your genital area around your urethra, where urine leaves your body. These bacteria grow and cause inflammation of your urinary tract. What increases the risk? You are more likely to develop this condition if: You have a urinary catheter that stays in place. You are not able to control when you urinate or have a bowel movement (incontinence). You are female and you: Use a spermicide or diaphragm for birth control. Have low estrogen levels. Are pregnant. You have certain genes that increase your risk. You are sexually active. You take antibiotic medicines. You have a condition that causes your flow of urine to slow down, such as: An enlarged prostate, if you are female. Blockage in your urethra. A kidney stone. A nerve condition that affects your bladder control (neurogenic bladder). Not getting enough to drink, or not urinating often. You have certain medical conditions, such as: Diabetes. A weak disease-fighting system (immunesystem). Sickle cell disease. Gout. Spinal cord injury. What are the signs or symptoms? Symptoms of this condition include: Needing to urinate right away (urgency). Frequent urination. This may include small amounts of urine each time you urinate. Pain or burning with urination. Blood in the urine. Urine that smells bad or unusual. Trouble urinating. Cloudy urine. Vaginal discharge, if you are female. Pain in the abdomen or the lower back. You may also have: Vomiting or a decreased appetite. Confusion. Irritability or tiredness. A fever or  chills. Diarrhea. The first symptom in older adults may be confusion. In some cases, they may not have any symptoms until the infection has worsened. How is this diagnosed? This condition is diagnosed based on your medical history and a physical exam. You may also have other tests, including: Urine tests. Blood tests. Tests for STIs (sexually transmitted infections). If you have had more than one UTI, a cystoscopy or imaging studies may be done to determine the cause of the infections. How is this treated? Treatment for this condition includes: Antibiotic medicine. Over-the-counter medicines to treat discomfort. Drinking enough water to stay hydrated. If you have frequent infections or have other conditions such as a kidney stone, you may need to see a health care provider who specializes in the urinary tract (urologist). In rare cases, urinary tract infections can cause sepsis. Sepsis is a life-threatening condition that occurs when the body responds to an infection. Sepsis is treated in the hospital with IV antibiotics, fluids, and other medicines. Follow these instructions at home:  Medicines Take over-the-counter and prescription medicines only as told by your health care provider. If you were prescribed an antibiotic medicine, take it as told by your health care provider. Do not stop using the antibiotic even if you start to feel better. General instructions Make sure you: Empty your bladder often and completely. Do not hold urine for long periods of time. Empty your bladder after sex. Wipe from front to back after urinating or having a bowel movement if you are female. Use each tissue only one time when you wipe. Drink enough fluid to keep your urine pale yellow. Keep all follow-up visits. This is important. Contact a health   care provider if: Your symptoms do not get better after 1-2 days. Your symptoms go away and then return. Get help right away if: You have severe pain in  your back or your lower abdomen. You have a fever or chills. You have nausea or vomiting. Summary A urinary tract infection (UTI) is an infection of any part of the urinary tract, which includes the kidneys, ureters, bladder, and urethra. Most urinary tract infections are caused by bacteria in your genital area. Treatment for this condition often includes antibiotic medicines. If you were prescribed an antibiotic medicine, take it as told by your health care provider. Do not stop using the antibiotic even if you start to feel better. Keep all follow-up visits. This is important. This information is not intended to replace advice given to you by your health care provider. Make sure you discuss any questions you have with your health care provider. Document Revised: 01/18/2020 Document Reviewed: 01/23/2020 Elsevier Patient Education  2024 [employer]  

## 2023-04-21 NOTE — Progress Notes (Signed)
Virtual Visit Consent   Maria Lang, you are scheduled for a virtual visit with a [facility] provider today. Just as with appointments in the office, your consent must be obtained to participate. Your consent will be active for this visit and any virtual visit you may have with one of our providers in the next 365 days. If you have a MyChart account, a copy of this consent can be sent to you electronically.  As this is a virtual visit, video technology does not allow for your provider to perform a traditional examination. This may limit your provider's ability to fully assess your condition. If your provider identifies any concerns that need to be evaluated in person or the need to arrange testing (such as labs, EKG, etc.), we will make arrangements to do so. Although advances in technology are sophisticated, we cannot ensure that it will always work on either your end or our end. If the connection with a video visit is poor, the visit may have to be switched to a telephone visit. With either a video or telephone visit, we are not always able to ensure that we have a secure connection.  By engaging in this virtual visit, you consent to the provision of healthcare and authorize for your insurance to be billed (if applicable) for the services provided during this visit. Depending on your insurance coverage, you may receive a charge related to this service.  I need to obtain your verbal consent now. Are you willing to proceed with your visit today? Maria Lang has provided verbal consent on 04/21/2023 for a virtual visit (video or telephone). Maria Curio, FNP  Date: 04/21/2023 11:41 AM  Virtual Visit via Video Note   I, Maria Lang, connected with  Maria Lang  ([id number], [birth year]) on 04/21/23 at 12:00 PM EDT by a video-enabled telemedicine application and verified that I am speaking with the correct person using two identifiers.  Location: Patient: Virtual Visit Location Patient:  Home Provider: Virtual Visit Location Provider: Home Office   I discussed the limitations of evaluation and management by telemedicine and the availability of in person appointments. The patient expressed understanding and agreed to proceed.    History of Present Illness: Maria Lang is a 42 y.o. who identifies as a female who was assigned female at birth, and is being seen today for bladder spasms, urinary frequency and burning. No fever. No discharge. Maria Lang Kitchen  HPI: HPI  Problems:  Patient Active Problem List   Diagnosis Date Noted   Acute stroke due to ischemia (HCC) 10/10/2021   Acute ischemic stroke (HCC) 10/10/2021   Rectal bleeding 09/07/2021   Nausea without vomiting 09/07/2021   Acute CVA (cerebrovascular accident) (HCC) 12/21/2018   Sleep apnea    Anxiety    Benzodiazepine dependence (HCC)    Acute Left-sided weakness    Acute Speech abnormality    Smoker    Hyperglycemia    Hypothyroidism    Chest pain 12/10/2014   GERD (gastroesophageal reflux disease) 12/10/2014   Chronic diastolic CHF (congestive heart failure) (HCC) 12/10/2014   Hypertension    Pain in the chest    Hypertensive emergency     Allergies:  Allergies  Allergen Reactions   Hydrocodeine [Dihydrocodeine] Shortness Of Breath and Nausea And Vomiting   Hydrocodone-Acetaminophen Shortness Of Breath and Nausea Only   Coreg [Carvedilol]     Headache    Labetalol Other (See Comments)    Head itches   Medications:  Current Outpatient Medications:    albuterol (  VENTOLIN HFA) 108 (90 Base) MCG/ACT inhaler, Inhale 2 puffs into the lungs every 4 (four) hours as needed for wheezing or shortness of breath. (Patient not taking: Reported on 10/12/2022), Disp: 1 each, Rfl: 0   ALPRAZolam (XANAX) 1 MG tablet, Take 0.5-1 tablets by mouth 3 (three) times daily as needed for anxiety., Disp: , Rfl: 1   amLODipine (NORVASC) 10 MG tablet, Take 10 mg by mouth daily., Disp: , Rfl:    atorvastatin (LIPITOR) 80 MG tablet,  Take 80 mg by mouth daily., Disp: , Rfl:    clopidogrel (PLAVIX) 75 MG tablet, Take 1 tablet (75 mg total) by mouth daily., Disp: 30 tablet, Rfl: 1   dicyclomine (BENTYL) 20 MG tablet, Take 1 tablet (20 mg total) by mouth every 6 (six) hours. (Patient not taking: Reported on 06/06/2022), Disp: 10 tablet, Rfl: 0   ezetimibe (ZETIA) 10 MG tablet, Take 10 mg by mouth daily., Disp: , Rfl:    gabapentin (NEURONTIN) 300 MG capsule, Take 300 mg by mouth daily as needed (nerve pain)., Disp: , Rfl:    levothyroxine (SYNTHROID) 75 MCG tablet, Take 75 mcg by mouth daily before breakfast., Disp: , Rfl:    losartan-hydrochlorothiazide (HYZAAR) 100-12.5 MG tablet, TAKE 1 TABLET BY MOUTH EVERY DAY, Disp: 30 tablet, Rfl: 0   ondansetron (ZOFRAN-ODT) 8 MG disintegrating tablet, Take 8 mg by mouth every 8 (eight) hours as needed for nausea/vomiting., Disp: , Rfl:    pantoprazole (PROTONIX) 40 MG tablet, TAKE 1 TABLET BY MOUTH TWICE DAILY BEFORE BREAKFAST AND DINNER (Patient taking differently: Take 80 mg by mouth daily.), Disp: 180 tablet, Rfl: 0   predniSONE (DELTASONE) 50 MG tablet, Take 1 tablet (50 mg total) by mouth daily. (Patient not taking: Reported on 10/12/2022), Disp: 5 tablet, Rfl: 0   promethazine-dextromethorphan (PROMETHAZINE-DM) 6.25-15 MG/5ML syrup, Take 5 mLs by mouth 4 (four) times daily as needed. (Patient not taking: Reported on 10/12/2022), Disp: 118 mL, Rfl: 0   propranolol ER (INDERAL LA) 60 MG 24 hr capsule, Take 60 mg by mouth daily., Disp: , Rfl:    sertraline (ZOLOFT) 100 MG tablet, Take 100 mg by mouth daily., Disp: , Rfl:    tiZANidine (ZANAFLEX) 4 MG capsule, Take 4 mg by mouth in the morning and at bedtime. (Patient not taking: Reported on 10/12/2022), Disp: , Rfl:   Observations/Objective: Patient is well-developed, well-nourished in no acute distress.  Resting comfortably  at home.  Head is normocephalic, atraumatic.  No labored breathing.  Speech is clear and coherent with logical  content.  Patient is alert and oriented at baseline.    Assessment and Plan: 1. Urinary tract infection without hematuria, site unspecified  Increase fluids, see information sheet, practice preventative measures, UC if sx worsen pr persist. AZO OTC.  Follow Up Instructions: I discussed the assessment and treatment plan with the patient. The patient was provided an opportunity to ask questions and all were answered. The patient agreed with the plan and demonstrated an understanding of the instructions.  A copy of instructions were sent to the patient via MyChart unless otherwise noted below.     The patient was advised to call back or seek an in-person evaluation if the symptoms worsen or if the condition fails to improve as anticipated.    Maria Curio, FNP

## 2023-04-23 ENCOUNTER — Ambulatory Visit (INDEPENDENT_AMBULATORY_CARE_PROVIDER_SITE_OTHER): Payer: BC Managed Care – PPO | Admitting: Neurology

## 2023-04-23 ENCOUNTER — Encounter: Payer: Self-pay | Admitting: Neurology

## 2023-04-23 VITALS — BP 132/92 | HR 76 | Ht 69.0 in | Wt 195.0 lb

## 2023-04-23 DIAGNOSIS — G3184 Mild cognitive impairment, so stated: Secondary | ICD-10-CM

## 2023-04-23 DIAGNOSIS — Z8673 Personal history of transient ischemic attack (TIA), and cerebral infarction without residual deficits: Secondary | ICD-10-CM | POA: Diagnosis not present

## 2023-04-23 DIAGNOSIS — R413 Other amnesia: Secondary | ICD-10-CM

## 2023-04-23 NOTE — Patient Instructions (Addendum)
I had a long discussion with patient regarding the results of the lab work for reversible causes of memory loss and MRI scan and answered questions.  Continue Plavix for stroke prevention modification with strict control of hypertension with blood pressure goal below 13090,/lipids with LDL cholesterol goal below 70 mg % and diabetes with hemoglobin A1c goal below 6.5%.Maria Lang  She was encouraged to eat a healthy diet with lots of fruits, vegetables, cereals and whole grains diet regularly and lose weight.  I encouraged her to increase participation in cognitively challenging activities like solving crossword puzzles, playing bridge, sudoku and word searches.  We also discussed memory compensation strategies. I encouraged her to follow-up with her primary physician for optimal treatment for her anxiety and depression. She will return for follow-up in the future in 6 months or call earlier if necessary.  Memory Compensation Strategies  Use "WARM" strategy.  W= write it down  A= associate it  R= repeat it  M= make a mental note  2.   You can keep a Glass blower/designer.  Use a 3-ring notebook with sections for the following: calendar, important names and phone numbers,  medications, doctors' names/phone numbers, lists/reminders, and a section to journal what you did  each day.   3.    Use a calendar to write appointments down.  4.    Write yourself a schedule for the day.  This can be placed on the calendar or in a separate section of the Memory Notebook.  Keeping a  regular schedule can help memory.  5.    Use medication organizer with sections for each day or morning/evening pills.  You may need help loading it  6.    Keep a basket, or pegboard by the door.  Place items that you need to take out with you in the basket or on the pegboard.  You may also want to  include a message board for reminders.  7.    Use sticky notes.  Place sticky notes with reminders in a place where the task is performed.  For  example: " turn off the  stove" placed by the stove, "lock the door" placed on the door at eye level, " take your medications" on  the bathroom mirror or by the place where you normally take your medications.  8.    Use alarms/timers.  Use while cooking to remind yourself to check on food or as a reminder to take your medicine, or as a  reminder to make a call, or as a reminder to perform another task, etc.

## 2023-04-23 NOTE — Progress Notes (Unsigned)
[facility] [address] [place]. [place] [address] [phone]       OFFICE FOLLOW-UP VISIT NOTE  [name] Date of Birth:  28-Sep-1980 Medical Record Number:  [id number]   Referring MD:  [place] Zandel, PA-c  Reason for Referral:  Memory loss  HPI: Update 04/23/2023 : She returns for follow-up after last visit 6 months ago.  She continues to do well.  She is still have some short-term memory and cognitive difficulties but she feels these are unchanged and seem most noticeable when she is under stress.  She also has underlying anxiety which also makes situation worse.  She denies any headaches or focal neurological symptoms.  She is still independent in activities of daily living.  She had carpal tunnel surgery in the left hand on 04/02/2023 and in the right hand in August this year.  She underwent lab work at last visit on 10/12/2022 and vitamin B12, TSH, RPR and homocystine were all normal.  Apoprotein E blood test was positive for a single copy of a 3/84 which puts her at slightly high risk for Alzheimer's late in life.  MRI scan of the brain on 10/17/2022 shows remote age right pontine infarct with changes of small vessel disease.  No acute abnormalities.  She has no new complaints today.  She remains on Plavix which she is tolerating well without bruising or bleeding and she states her blood pressure and cholesterol both have been under good control and she is tolerating Lipitor well without muscle aches and pains.  She states she had lab work checked by primary physician a few months ago and everything was satisfactory.  I do not have those results to review today.  Prior visit 10/12/2022 [name] is a 42 year old Hispanic lady seen today for initial office consultation visit for memory and speech difficulties.  History is obtained from the patient and review of electronic medical records and I personally reviewed pertinent available imaging films in PACS.  She has  past medical history of hypertension, hyperlipidemia, sleep apnea, GERD, right pontine stroke without residual deficits and strokelike episode in April 2023 with left-sided weakness treated with TNK. Patient states that she does notice mild memory difficulties and speech difficulties which seem to have gotten worse for the last few months.  She has word finding difficulties and often gets stuck in sentences and has trouble getting words out.  She has some good days and bad days.  She does have a history of longstanding anxiety but she feels it is well-controlled on the current medication regimen of Zoloft and Xanax as needed.  She denies any increased stressors or increase in anxiety.  She is fully independent in activities of daily living except she is scared to use dose to during the day as she may forget to turn it off so she uses the microwave and only when her husband is at home she cooks on the stove.  She denies any significant headaches, gait or balance problems.  There is no history of significant head injury with loss of consciousness, seizures.  She does have a strong family history of dementia on both sides of the family.  Her dad died of Alzheimer's disease.  She worries about this diagnosis.  She has not had any recent lab work or brain imaging done.  Prior office visits Update 05/31/2022 JM: Patient returns for 40-month stroke follow-up.  Overall stable from stroke standpoint without new or reoccurring stroke/TIA symptoms.  Remains on Plavix and atorvastatin  and Zetia.  Blood pressure well controlled at home, typically 130s/90s.  Routinely follows with PCP.  She continues to have left leg pain and more recently low back pain, is being seen by Ortho for SI joint issues (per patient) and currently working with PT, per patient if no improvement with PT then plans on pursuing imaging for further evaluation, has f/u with Ortho on 1/9 (unable to view via epic).  No further concerns at this time.        History provided for reference purposes only Initial visit 11/24/2021 JM: Patient is being seen for initial hospital follow-up.  She was previously seen in office 03/05/2019 for initial hospital stroke follow-up but did not follow-up after that time. She does have occasional left leg pain which she reports started shortly after discharge, was started on gabapentin by PCP which she uses as needed, has been getting better. No new stroke/TIA symptoms.  Has returned back to all prior activities.   Completed 3 weeks DAPT, remains on Plavix alone as well as atorvastatin, denies side effects.  Blood pressure today 146/102, monitors at home and has been running on higher side of normal more recently.  She believes this is more due to her anxiety regarding her current health.  Shortly before her TIA, she was found to have a rectal tumor and was scheduled for surgical removal on 5/25 but this was canceled in setting of recent TIA.  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  She is awaiting to undergo CT abdomen/pelvis to ensure no evidence of lymphadenopathy. She questions at what point can Plavix be held to undergo removal.  She was able to complete about 1 to 2 weeks of cardiac monitor but had difficulty and monitor consistently said poor skin contact.  She has not yet sent back monitor. Remaining hypercoagulable labs pending at discharge were largely unremarkable.  No further concerns at this time.   Stroke admission 10/10/2021 Maria Lang is a 42 year old female with history of hypertension, hyperlipidemia, former smoker, OSA, hx of R pontine stroke in 11/2018 without residual deficit who presented on 10/10/2021 with left-sided weakness, and decreased level of consciousness.  Personally reviewed hospitalization pertinent progress notes, lab work and imaging.  Evaluated by Dr. Roda Shutters.  CT no acute abnormality.  Status post TNK.  CTA head and neck showed right P2 moderate stenosis.  MRI no acute infarct.   EF 60 to 65%, LDL 71, A1c 5.3.  DVT negative.  Hypercoag work-up pending at discharge.  Creatinine 1.20, ESR 30, UA WBC 21-50.  Evaluated by Dr. Roda Shutters, exam neurologically intact without any deficit.  Symptoms possibly TIA vs aborted stroke s/p TNK although etiology not quite clear therefore recommended 30-day cardiac event monitor to rule out A-fib.  Recommended DAPT for 3 weeks and Plavix alone and continue atorvastatin 40 mg daily. Discharged home without therapy needs.    ROS:   14 system review of systems is positive for memory loss, cognitive difficulties, speech difficulties, word finding difficulties, anxiety all other systems negative  PMH:  Past Medical History:  Diagnosis Date   Anxiety    Deviated septum    GERD (gastroesophageal reflux disease)    History of bilateral salpingectomy    Hyperlipidemia    Hypertension    Renal stones    Sleep apnea    Doesn't use everyday   Stroke [facility])    Thyroid disease     Social History:  Social History   Socioeconomic History   Marital status:  Married    Spouse name: Not on file   Number of children: Not on file   Years of education: Not on file   Highest education level: Not on file  Occupational History   Not on file  Tobacco Use   Smoking status: Former    Current packs/day: 0.00    Average packs/day: 0.3 packs/day for 22.5 years (5.6 ttl pk-yrs)    Types: Cigarettes    Start date: 10/04/1997    Quit date: 03/26/2020    Years since quitting: 3.0   Smokeless tobacco: Never  Vaping Use   Vaping status: Some Days  Substance and Sexual Activity   Alcohol use: No   Drug use: Never   Sexual activity: Yes    Birth control/protection: None, Surgical  Other Topics Concern   Not on file  Social History Narrative   Not on file   Social Determinants of Health   Financial Resource Strain: Medium Risk (12/06/2022)   Received from [facility], [facility]   Overall Financial Resource Strain (CARDIA)    Difficulty of Paying  Living Expenses: Somewhat hard  Food Insecurity: Patient Declined (12/06/2022)   Received from [facility], [facility]   Hunger Vital Sign    Worried About Running Out of Food in the Last Year: Patient declined    Ran Out of Food in the Last Year: Patient declined  Transportation Needs: No Transportation Needs (12/06/2022)   Received from [employer], [facility]   PRAPARE - Transportation    Lack of Transportation (Medical): No    Lack of Transportation (Non-Medical): No  Physical Activity: Insufficiently Active (12/06/2022)   Received from [facility], [facility]   Exercise Vital Sign    Days of Exercise per Week: 3 days    Minutes of Exercise per Session: 10 min  Stress: Stress Concern Present (12/06/2022)   Received from [facility], [facility] - Occupational Stress Questionnaire    Feeling of Stress : To some extent  Social Connections: Socially Isolated (12/06/2022)   Received from [facility], [facility]   Social Network    How would you rate your social network (family, work, friends)?: Little participation, lonely and socially isolated  Intimate Partner Violence: Not At Risk (12/06/2022)   Received from [facility], [facility]   HITS    Over the last 12 months how often did your partner physically hurt you?: 1    Over the last 12 months how often did your partner insult you or talk down to you?: 1    Over the last 12 months how often did your partner threaten you with physical harm?: 1    Over the last 12 months how often did your partner scream or curse at you?: 1    Medications:   Current Outpatient Medications on File Prior to Visit  Medication Sig Dispense Refill   ALPRAZolam (XANAX) 1 MG tablet Take 0.5-1 tablets by mouth 3 (three) times daily as needed for anxiety.  1   amLODipine (NORVASC) 10 MG tablet Take 10 mg by mouth daily.     atorvastatin (LIPITOR) 80 MG tablet Take 80 mg by mouth daily.      clopidogrel (PLAVIX) 75 MG tablet Take 1 tablet (75 mg total) by mouth daily. 30 tablet 1   ezetimibe (ZETIA) 10 MG tablet Take 10 mg by mouth daily.     gabapentin (NEURONTIN) 300 MG capsule Take 300 mg by mouth daily as  needed (nerve pain).     levothyroxine (SYNTHROID) 75 MCG tablet Take 75 mcg by mouth daily before breakfast.     losartan-hydrochlorothiazide (HYZAAR) 100-12.5 MG tablet TAKE 1 TABLET BY MOUTH EVERY DAY 30 tablet 0   ondansetron (ZOFRAN-ODT) 8 MG disintegrating tablet Take 8 mg by mouth every 8 (eight) hours as needed for nausea/vomiting.     pantoprazole (PROTONIX) 40 MG tablet TAKE 1 TABLET BY MOUTH TWICE DAILY BEFORE BREAKFAST AND DINNER (Patient taking differently: Take 80 mg by mouth daily.) 180 tablet 0   propranolol ER (INDERAL LA) 60 MG 24 hr capsule Take 60 mg by mouth daily.     sertraline (ZOLOFT) 100 MG tablet Take 100 mg by mouth daily.     sulfamethoxazole-trimethoprim (BACTRIM DS) 800-160 MG tablet Take 1 tablet by mouth 2 (two) times daily for 7 days. 14 tablet 0   albuterol (VENTOLIN HFA) 108 (90 Base) MCG/ACT inhaler Inhale 2 puffs into the lungs every 4 (four) hours as needed for wheezing or shortness of breath. (Patient not taking: Reported on 10/12/2022) 1 each 0   dicyclomine (BENTYL) 20 MG tablet Take 1 tablet (20 mg total) by mouth every 6 (six) hours. (Patient not taking: Reported on 06/06/2022) 10 tablet 0   No current facility-administered medications on file prior to visit.    Allergies:   Allergies  Allergen Reactions   Hydrocodeine [Dihydrocodeine] Shortness Of Breath and Nausea And Vomiting   Hydrocodone-Acetaminophen Shortness Of Breath and Nausea Only   Coreg [Carvedilol]     Headache    Labetalol Other (See Comments)    Head itches    Physical Exam General: well developed, well nourished pleasant mildly obese middle-age lady, seated, in no evident distress Head: head normocephalic and atraumatic.   Neck: supple with no carotid or  supraclavicular bruits Cardiovascular: regular rate and rhythm, no murmurs Musculoskeletal: no deformity Skin:  no rash/petichiae Vascular:  Normal pulses all extremities  Neurologic Exam Mental Status: Awake and fully alert. Oriented to place and time. Recent and remote memory intact. Attention span, concentration and fund of knowledge appropriate. Mood and affect appropriate.  Diminished recall 2/3.  Able to name 14 animals which can walk on 4 legs.  Clock drawing 4/4.  Last visit 10/12/22 Mini-Mental status exam score 27/30.  Geriatric depression scale 4 not depressed Cranial Nerves: Fundoscopic exam reveals sharp disc margins. Pupils equal, briskly reactive to light. Extraocular movements full without nystagmus. Visual fields full to confrontation. Hearing intact. Facial sensation intact. Face, tongue, palate moves normally and symmetrically.  Motor: Normal bulk and tone. Normal strength in all tested extremity muscles. Sensory.: intact to touch , pinprick , position and vibratory sensation.  Coordination: Rapid alternating movements normal in all extremities. Finger-to-nose and heel-to-shin performed accurately bilaterally. Gait and Station: Arises from chair without difficulty. Stance is normal. Gait demonstrates normal stride length and balance . Able to heel, toe and tandem walk with moderate difficulty.  Reflexes: 1+ and symmetric. Toes downgoing.   NIHSS 0 Modified Rankin 1     10/12/2022    1:27 PM  MMSE - Mini Mental State Exam  Orientation to time 5  Orientation to Place 5  Registration 3  Attention/ Calculation 4  Recall 1  Language- name 2 objects 2  Language- repeat 1  Language- follow 3 step command 3  Language- read & follow direction 1  Write a sentence 1  Copy design 1  Total score 27    ASSESSMENT: 42 year old lady with subacute memory,  speech and cognitive difficulties likely from mild cognitive impairment which appears stable.  Remote history of stroke as well  as underlying anxiety depression which may be contributing.  Vascular risk factors of hypertension, hyperlipidemia, previous stroke and mild obesity.     PLANI had a long discussion with patient regarding the results of the lab work for reversible causes of memory loss and MRI scan and answered questions.  Continue Plavix for stroke prevention modification with strict control of hypertension with blood pressure goal below 13090,/lipids with LDL cholesterol goal below 70 mg % and diabetes with hemoglobin A1c goal below 6.5%.[name] Kitchen  She was encouraged to eat a healthy diet with lots of fruits, vegetables, cereals and whole grains diet regularly and lose weight.  I encouraged her to increase participation in cognitively challenging activities like solving crossword puzzles, playing bridge, sudoku and word searches.  We also discussed memory compensation strategies. I encouraged her to follow-up with her primary physician for optimal treatment for her anxiety and depression. She will return for follow-up in the future in 6 months or call earlier if necessary. Greater than 50% time during this 35-minute  visit were spent on counseling and coordination of care about her memory loss and cognitive impairment as well as remote stroke and answering questions. Delia Heady, MD Note: This document was prepared with digital dictation and possible smart phrase technology. Any transcriptional errors that result from this process are unintentional.

## 2023-04-26 ENCOUNTER — Ambulatory Visit: Payer: BC Managed Care – PPO | Admitting: Neurology

## 2023-04-27 ENCOUNTER — Encounter (HOSPITAL_COMMUNITY): Payer: Self-pay | Admitting: Gastroenterology

## 2023-04-27 NOTE — Progress Notes (Signed)
Attempted to obtain medical history for pre op call via telephone, unable to reach at this time. HIPAA compliant voicemail message left requesting return call to pre surgical testing department.

## 2023-05-04 ENCOUNTER — Telehealth: Payer: Self-pay | Admitting: Gastroenterology

## 2023-05-04 NOTE — Telephone Encounter (Signed)
The pt has been advised that her cycle will not interfere with her EUS as planned. She will keep appt as scheduled.

## 2023-05-04 NOTE — Telephone Encounter (Signed)
Inbound call from patient would like to know if she can continue with EUS with Dr. Meridee Score on 11/11. Patient states she is on her menstrual cycle and would like to know how to proceed. Patient states she is at a doctor's office and if she doesn't answer detailed message can be left.

## 2023-05-07 ENCOUNTER — Ambulatory Visit (HOSPITAL_COMMUNITY): Payer: BC Managed Care – PPO | Admitting: Anesthesiology

## 2023-05-07 ENCOUNTER — Other Ambulatory Visit: Payer: Self-pay

## 2023-05-07 ENCOUNTER — Encounter (HOSPITAL_COMMUNITY)
Admission: RE | Disposition: A | Discharge status: Home / Self Care | Payer: Self-pay | Source: Home / Self Care | Attending: Gastroenterology

## 2023-05-07 ENCOUNTER — Ambulatory Visit (HOSPITAL_COMMUNITY)
Admission: RE | Admit: 2023-05-07 | Discharge: 2023-05-07 | Disposition: A | Discharge status: Home / Self Care | Payer: BC Managed Care – PPO | Attending: Gastroenterology | Admitting: Gastroenterology

## 2023-05-07 ENCOUNTER — Encounter (HOSPITAL_COMMUNITY): Payer: Self-pay | Admitting: Gastroenterology

## 2023-05-07 DIAGNOSIS — Z9889 Other specified postprocedural states: Secondary | ICD-10-CM | POA: Diagnosis not present

## 2023-05-07 DIAGNOSIS — Z860100 Personal history of colon polyps, unspecified: Secondary | ICD-10-CM

## 2023-05-07 DIAGNOSIS — I69354 Hemiplegia and hemiparesis following cerebral infarction affecting left non-dominant side: Secondary | ICD-10-CM | POA: Insufficient documentation

## 2023-05-07 DIAGNOSIS — K6289 Other specified diseases of anus and rectum: Secondary | ICD-10-CM | POA: Insufficient documentation

## 2023-05-07 DIAGNOSIS — K641 Second degree hemorrhoids: Secondary | ICD-10-CM | POA: Diagnosis not present

## 2023-05-07 DIAGNOSIS — D3A8 Other benign neuroendocrine tumors: Secondary | ICD-10-CM

## 2023-05-07 DIAGNOSIS — I509 Heart failure, unspecified: Secondary | ICD-10-CM | POA: Diagnosis not present

## 2023-05-07 DIAGNOSIS — I110 Hypertensive heart disease with heart failure: Secondary | ICD-10-CM | POA: Diagnosis not present

## 2023-05-07 DIAGNOSIS — D128 Benign neoplasm of rectum: Secondary | ICD-10-CM | POA: Diagnosis not present

## 2023-05-07 HISTORY — PX: EUS: SHX5427

## 2023-05-07 HISTORY — PX: FLEXIBLE SIGMOIDOSCOPY: SHX5431

## 2023-05-07 HISTORY — PX: POLYPECTOMY: SHX5525

## 2023-05-07 SURGERY — ULTRASOUND, LOWER GI TRACT, ENDOSCOPIC
Anesthesia: Monitor Anesthesia Care

## 2023-05-07 MED ORDER — CLOPIDOGREL BISULFATE 75 MG PO TABS: 75.0000 mg | ORAL_TABLET | Freq: Every day | ORAL | 1 refills | Status: AC

## 2023-05-07 MED ORDER — PROPOFOL 10 MG/ML IV BOLUS
INTRAVENOUS | Status: DC | PRN
  Administered 2023-05-07 (×2): 100 mg via INTRAVENOUS

## 2023-05-07 MED ORDER — FENTANYL CITRATE (PF) 100 MCG/2ML IJ SOLN: 25.0000 ug | INTRAMUSCULAR | Status: DC | PRN

## 2023-05-07 MED ORDER — OXYCODONE HCL 5 MG/5ML PO SOLN: 5.0000 mg | Freq: Once | ORAL | Status: DC | PRN

## 2023-05-07 MED ORDER — SODIUM CHLORIDE 0.9 % IV SOLN: INTRAVENOUS | Status: DC

## 2023-05-07 MED ORDER — LIDOCAINE HCL (CARDIAC) PF 100 MG/5ML IV SOSY
PREFILLED_SYRINGE | INTRAVENOUS | Status: DC | PRN
  Administered 2023-05-07: 100 mg via INTRATRACHEAL

## 2023-05-07 MED ORDER — SODIUM CHLORIDE 0.9 % IV SOLN: INTRAVENOUS | Status: DC | PRN

## 2023-05-07 MED ORDER — ONDANSETRON HCL 4 MG/2ML IJ SOLN: 4.0000 mg | Freq: Once | INTRAMUSCULAR | Status: DC | PRN

## 2023-05-07 MED ORDER — PROPOFOL 500 MG/50ML IV EMUL
INTRAVENOUS | Status: DC | PRN
  Administered 2023-05-07: 100 ug/kg/min via INTRAVENOUS

## 2023-05-07 MED ORDER — PROPOFOL 1000 MG/100ML IV EMUL
INTRAVENOUS | Status: AC
  Filled 2023-05-07: qty 100

## 2023-05-07 MED ORDER — OXYCODONE HCL 5 MG PO TABS: 5.0000 mg | ORAL_TABLET | Freq: Once | ORAL | Status: DC | PRN

## 2023-05-07 NOTE — Transfer of Care (Signed)
Immediate Anesthesia Transfer of Care Note  Patient: Maria Lang  Procedure(s) Performed: LOWER ENDOSCOPIC ULTRASOUND (EUS) FLEXIBLE SIGMOIDOSCOPY  Patient Location: PACU  Anesthesia Type:MAC  Level of Consciousness: drowsy  Airway & Oxygen Therapy: Patient Spontanous Breathing and Patient connected to face mask oxygen  Post-op Assessment: Report given to RN and Post -op Vital signs reviewed and stable  Post vital signs: Reviewed and stable  Last Vitals:  Vitals Value Taken Time  BP 105/69 05/07/23 0918  Temp 36.6 C 05/07/23 0918  Pulse 70 05/07/23 0920  Resp 16 05/07/23 0920  SpO2 98 % 05/07/23 0920  Vitals shown include unfiled device data.  Last Pain:  Vitals:   05/07/23 0918  TempSrc: Temporal  PainSc: 0-No pain         Complications: No notable events documented.

## 2023-05-07 NOTE — Discharge Instructions (Signed)
YOU HAD AN ENDOSCOPIC PROCEDURE TODAY: Refer to the procedure report and other information in the discharge instructions given to you for any specific questions about what was found during the examination. If this information does not answer your questions, please call [place] office at [phone] to clarify.   YOU SHOULD EXPECT: Some feelings of bloating in the abdomen. Passage of more gas than usual. Walking can help get rid of the air that was put into your GI tract during the procedure and reduce the bloating. If you had a lower endoscopy (such as a colonoscopy or flexible sigmoidoscopy) you may notice spotting of blood in your stool or on the toilet paper. Some abdominal soreness may be present for a day or two, also.  DIET: Your first meal following the procedure should be a light meal and then it is ok to progress to your normal diet. A half-sandwich or bowl of soup is an example of a good first meal. Heavy or fried foods are harder to digest and may make you feel nauseous or bloated. Drink plenty of fluids but you should avoid alcoholic beverages for 24 hours.  ACTIVITY: Your care partner should take you home directly after the procedure. You should plan to take it easy, moving slowly for the rest of the day. You can resume normal activity the day after the procedure however YOU SHOULD NOT DRIVE, use power tools, machinery or perform tasks that involve climbing or major physical exertion for 24 hours (because of the sedation medicines used during the test).   SYMPTOMS TO REPORT IMMEDIATELY: A gastroenterologist can be reached at any hour. Please call [phone]  for any of the following symptoms:  Following lower endoscopy (colonoscopy, flexible sigmoidoscopy) Excessive amounts of blood in the stool  Significant tenderness, worsening of abdominal pains  Swelling of the abdomen that is new, acute  Fever of 100 or higher  FOLLOW UP:  If any biopsies were taken you will be contacted by  phone or by letter within the next 1-3 weeks. Call [phone]  if you have not heard about the biopsies in 3 weeks.  Please also call with any specific questions about appointments or follow up tests.

## 2023-05-07 NOTE — Op Note (Signed)
[facility] Patient Name: Maria Lang Procedure Date: 05/07/2023 MRN: [id number] Attending MD: Corliss Parish , MD, [id number] Date of Birth: 03/25/1981 CSN: [id number] Age: 42 Admit Type: Ambulatory Procedure:                Lower EUS Indications:              Rectal deformity found on endoscopy; subepithelial                            tumor versus extrinsic compression, Neuroendocrine                            Tumor Providers:                Corliss Parish, MD, Norman Clay, RN, Marja Kays, Technician Referring MD:             Venita Lick. Russella Dar, MD Medicines:                Monitored Anesthesia Care Complications:            No immediate complications. Estimated Blood Loss:     Estimated blood loss was minimal. Procedure:                Pre-Anesthesia Assessment:                           - Prior to the procedure, a History and Physical                            was performed, and patient medications and                            allergies were reviewed. The patient's tolerance of                            previous anesthesia was also reviewed. The risks                            and benefits of the procedure and the sedation                            options and risks were discussed with the patient.                            All questions were answered, and informed consent                            was obtained. Prior Anticoagulants: The patient has                            taken Plavix (clopidogrel), last dose was 5 days                            prior  to procedure. ASA Grade Assessment: III - A                            patient with severe systemic disease. After                            reviewing the risks and benefits, the patient was                            deemed in satisfactory condition to undergo the                            procedure.                           After obtaining informed  consent, the endoscope was                            passed under direct vision. Throughout the                            procedure, the patient's blood pressure, pulse, and                            oxygen saturations were monitored continuously. The                            [id number] ([id number]) Olympus therapeutic endoscope                            was introduced through the anus and advanced to the                            the descending colon for ultrasound. The                            [id number] ([id number]) Olympus radial ultrasound                            scope was introduced through the anus and advanced                            to the the rectosigmoid junction for ultrasound.                            The lower EUS was accomplished without difficulty.                            The patient tolerated the procedure. The quality of                            the bowel preparation was adequate. Scope In: 8:50:03 AM Scope Out: 9:12:08 AM Total Procedure Duration: 0 hours 22 minutes 5 seconds  Findings:      ENDOSCOPIC  FINDING: :      A medium post mucosectomy scar was found in the mid rectum. The scar       tissue was healthy in appearance. There was what appeared to be some       mild polypoid tissue in the periphery (not clearly adenomatous more       likely hyperplastic). After the EUS was completed, the region/scar was       sampled with a cold snare. Resection and retrieval were complete.      Normal mucosa was found in the rectum, in the recto-sigmoid colon, in       the sigmoid colon and in the descending colon.      Non-bleeding non-thrombosed external and internal hemorrhoids were found       during retroflexion, during perianal exam and during digital exam. The       hemorrhoids were Grade II (internal hemorrhoids that prolapse but reduce       spontaneously).      ENDOSONOGRAPHIC FINDING: :      Wall thickening was found in the rectum at the area of scar  site. This       finding appeared to be primarily due to increased thickness of the       luminal interface/superficial mucosa (Layer 1) and deep mucosa (Layer       2). No evidence of any lesion was present.      The perirectal space was normal.      No malignant-appearing lymph nodes were visualized in the perirectal       region and in the left iliac region. The nodes were.      The internal anal sphincter was visualized endosonographically and       appeared normal. Impression:               Flex impression:                           - Post mucosectomy scar in the mid rectum. The area                            was sampled after EUS (though this looks to be more                            granulation and hyperplastic change rather than                            adenoma or NET).                           - Normal mucosa in the rectum, in the recto-sigmoid                            colon, in the sigmoid colon and in the descending                            colon.                           - Non-bleeding non-thrombosed external and internal  hemorrhoids.                           EUS impression:                           - Wall thickening was visualized                            endosonographically in the rectum. This was due to                            increased thickness of the luminal                            interface/superficial mucosa (Layer 1) and deep                            mucosa (Layer 2).                           - Endosonographic images of the perirectal space                            were unremarkable.                           - No malignant-appearing lymph nodes were                            visualized endosonographically in the perirectal                            region and in the left iliac region.                           - The internal anal sphincter was visualized                            endosonographically and  appeared normal. Moderate Sedation:      Not Applicable - Patient had care per Anesthesia. Recommendation:           - The patient will be observed post-procedure,                            until all discharge criteria are met.                           - Discharge patient to home.                           - Patient has a contact number available for                            emergencies. The signs and symptoms of potential  delayed complications were discussed with the                            patient. Return to normal activities tomorrow.                            Written discharge instructions were provided to the                            patient.                           - Resume previous diet.                           - Await path results.                           - As long as there is no evidence of NET, then                            repeat lower EUS in 1 to 2 years is reasonable                            based on complete previous resection and no                            recurrence at the first EUS.                           - Patient has evidence of iron deficiency anemia,                            though she still is menstruating. She did describe                            a few months ago episodes of melena of unclear                            etiology that cleared on their own. Have                            recommended upper endoscopy with primary                            gastroenterologist or myself, pending availability                            in the next few weeks just to ensure nothing else                            is occurring, and patient agrees with this plan of  action.                           - The findings and recommendations were discussed                            with the patient.                           - The findings and recommendations were discussed                             with the patient's family. Procedure Code(s):        --- Professional ---                           [phone], Sigmoidoscopy, flexible; with endoscopic                            ultrasound examination                           [phone], Sigmoidoscopy, flexible; with removal of                            tumor(s), polyp(s), or other lesion(s) by snare                            technique Diagnosis Code(s):        --- Professional ---                           K64.1, Second degree hemorrhoids                           Z98.890, Other specified postprocedural states                           K62.89, Other specified diseases of anus and rectum                           I89.9, Noninfective disorder of lymphatic vessels                            and lymph nodes, unspecified CPT copyright 2022 American Medical Association. All rights reserved. The codes documented in this report are preliminary and upon coder review may  be revised to meet current compliance requirements. Corliss Parish, MD 05/07/2023 9:29:09 AM Number of Addenda: 0

## 2023-05-07 NOTE — Anesthesia Preprocedure Evaluation (Signed)
Anesthesia Evaluation  Patient identified by MRN, date of birth, ID band Patient awake    Reviewed: Allergy & Precautions, NPO status , Patient's Chart, lab work & pertinent test results, reviewed documented beta blocker date and time   History of Anesthesia Complications Negative for: history of anesthetic complications  Airway Mallampati: IV  TM Distance: >3 FB   Mouth opening: Limited Mouth Opening  Dental  (+) Poor Dentition,    Pulmonary sleep apnea , former smoker   breath sounds clear to auscultation       Cardiovascular hypertension, Pt. on medications (-) angina +CHF  (-) CAD and (-) Past MI  Rhythm:Regular  Normal TTE   Neuro/Psych   Anxiety     CVA (mild residual L sided weakness), Residual Symptoms    GI/Hepatic ,GERD  ,,(+) neg Cirrhosis        Endo/Other  Hypothyroidism    Renal/GU Renal disease     Musculoskeletal   Abdominal  (+) + obese  Peds  Hematology  (+) Blood dyscrasia, anemia   Anesthesia Other Findings   Reproductive/Obstetrics                              Anesthesia Physical Anesthesia Plan  ASA: 2  Anesthesia Plan: MAC   Post-op Pain Management:    Induction: Intravenous  PONV Risk Score and Plan: Propofol infusion  Airway Management Planned:   Additional Equipment:   Intra-op Plan:   Post-operative Plan:   Informed Consent: I have reviewed the patients History and Physical, chart, labs and discussed the procedure including the risks, benefits and alternatives for the proposed anesthesia with the patient or authorized representative who has indicated his/her understanding and acceptance.     Dental advisory given  Plan Discussed with: CRNA  Anesthesia Plan Comments:          Anesthesia Quick Evaluation

## 2023-05-07 NOTE — Anesthesia Postprocedure Evaluation (Signed)
Anesthesia Post Note  Patient: Maria Lang  Procedure(s) Performed: LOWER ENDOSCOPIC ULTRASOUND (EUS) FLEXIBLE SIGMOIDOSCOPY POLYPECTOMY     Patient location during evaluation: PACU Anesthesia Type: MAC Level of consciousness: awake and alert Pain management: pain level controlled Vital Signs Assessment: post-procedure vital signs reviewed and stable Respiratory status: spontaneous breathing, nonlabored ventilation, respiratory function stable and patient connected to nasal cannula oxygen Cardiovascular status: stable and blood pressure returned to baseline Postop Assessment: no apparent nausea or vomiting Anesthetic complications: no   No notable events documented.  Last Vitals:  Vitals:   05/07/23 0930 05/07/23 0940  BP: 136/75 124/79  Pulse: 68 71  Resp: 14 15  Temp:    SpO2: 98% 99%    Last Pain:  Vitals:   05/07/23 0940  TempSrc:   PainSc: 0-No pain                 Mariann Barter

## 2023-05-07 NOTE — H&P (Signed)
GASTROENTEROLOGY PROCEDURE H&P NOTE   Primary Care Physician: [name], [name], PA-C  HPI: Maria Lang is a 42 y.o. 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  Past Medical History:  Diagnosis Date   Anxiety    Deviated septum    GERD (gastroesophageal reflux disease)    History of bilateral salpingectomy    Hyperlipidemia    Hypertension    Renal stones    Sleep apnea    Doesn't use everyday   Stroke [facility])    Thyroid disease    Past Surgical History:  Procedure Laterality Date   BILATERAL SALPINGECTOMY  07.2016   CYSTOSCOPY WITH RETROGRADE PYELOGRAM, URETEROSCOPY AND STENT PLACEMENT Right 07/29/2015   Procedure: CYSTOSCOPY WITH RIGHT RETROGRADE PYELOGRAM, RIGHT URETEROSCOPY AND STENT PLACEMENT;  Surgeon: Bjorn Pippin, MD;  Location: [facility];  Service: Urology;  Laterality: Right;   ECTOPIC PREGNANCY SURGERY  10/2013   ENDOSCOPIC MUCOSAL RESECTION  02/13/2022   Procedure: ENDOSCOPIC MUCOSAL RESECTION;  Surgeon: Lemar Lofty., MD;  Location: [facility];  Service: Gastroenterology;;   EUS N/A 02/13/2022   Procedure: LOWER ENDOSCOPIC ULTRASOUND (EUS);  Surgeon: Lemar Lofty., MD;  Location: [facility];  Service: Gastroenterology;  Laterality: N/A;   FLEXIBLE SIGMOIDOSCOPY N/A 02/13/2022   Procedure: FLEXIBLE SIGMOIDOSCOPY;  Surgeon: Meridee Score Netty Starring., MD;  Location: [facility];  Service: Gastroenterology;  Laterality: N/A;   HEMOSTASIS CLIP PLACEMENT  02/13/2022   Procedure: HEMOSTASIS CLIP PLACEMENT;  Surgeon: Lemar Lofty., MD;  Location: [facility];  Service: Gastroenterology;;   HOLMIUM LASER APPLICATION Right 07/29/2015   Procedure: HOLMIUM LASER APPLICATION;  Surgeon: Bjorn Pippin, MD;  Location: [facility];  Service: Urology;  Laterality: Right;   NASAL SEPTUM SURGERY     doen along with tonsillectomy    SUBMUCOSAL LIFTING INJECTION  02/13/2022   Procedure: SUBMUCOSAL LIFTING INJECTION;  Surgeon: Lemar Lofty., MD;  Location: [facility];  Service: Gastroenterology;;   TONSILLECTOMY     TYMPANOSTOMY     x 3   No current facility-administered medications for this encounter.   No current facility-administered medications for this encounter. Allergies  Allergen Reactions   Hydrocodeine [Dihydrocodeine] Shortness Of Breath and Nausea And Vomiting   Hydrocodone-Acetaminophen Shortness Of Breath and Nausea Only   Coreg [Carvedilol]     Headache    Labetalol Other (See Comments)    Head itches   Family History  Problem Relation Age of Onset   Hypertension Mother    Hypertension Father    Hyperlipidemia Father    Diabetes Brother    Diabetes Other    Social History   Socioeconomic History   Marital status: Married    Spouse name: Not on file   Number of children: Not on file   Years of education: Not on file   Highest education level: Not on file  Occupational History   Not on file  Tobacco Use   Smoking status: Former    Current packs/day: 0.00    Average packs/day: 0.3 packs/day for 22.5 years (5.6 ttl pk-yrs)    Types: Cigarettes    Start date: 10/04/1997    Quit date: 03/26/2020    Years since quitting: 3.1   Smokeless tobacco: Never  Vaping Use   Vaping status: Some Days  Substance and Sexual Activity   Alcohol use: No   Drug use: Never   Sexual activity: Yes    Birth control/protection: None, Surgical  Other Topics Concern   Not on file  Social History Narrative   Not on file   Social Determinants of Health   Financial Resource Strain: Medium Risk (12/06/2022)   Received from [facility], [facility]   Overall Financial Resource Strain (CARDIA)    Difficulty of Paying Living Expenses: Somewhat hard  Food Insecurity: Patient Declined (12/06/2022)   Received from [facility], [facility]   Hunger Vital Sign    Worried About Running Out of Food in the Last Year: Patient declined    Ran Out of Food in the Last Year: Patient declined  Transportation  Needs: No Transportation Needs (12/06/2022)   Received from [employer], [facility]   PRAPARE - Transportation    Lack of Transportation (Medical): No    Lack of Transportation (Non-Medical): No  Physical Activity: Insufficiently Active (12/06/2022)   Received from [facility], [facility]   Exercise Vital Sign    Days of Exercise per Week: 3 days    Minutes of Exercise per Session: 10 min  Stress: Stress Concern Present (12/06/2022)   Received from [facility], [facility] of Occupational Health - Occupational Stress Questionnaire    Feeling of Stress : To some extent  Social Connections: Socially Isolated (12/06/2022)   Received from [facility], [facility]   Social Network    How would you rate your social network (family, work, friends)?: Little participation, lonely and socially isolated  Intimate Partner Violence: Not At Risk (12/06/2022)   Received from [facility], [facility]   HITS    Over the last 12 months how often did your partner physically hurt you?: Never    Over the last 12 months how often did your partner insult you or talk down to you?: Never    Over the last 12 months how often did your partner threaten you with physical harm?: Never    Over the last 12 months how often did your partner scream or curse at you?: Never    Physical Exam: There were no vitals filed for this visit. There is no height or weight on file to calculate BMI. GEN: NAD EYE: Sclerae anicteric ENT: MMM CV: Non-tachycardic GI: Soft, NT/ND NEURO:  Alert & Oriented x 3  Lab Results: No results for input(s): "WBC", "HGB", "HCT", "PLT" in the last 72 hours. BMET No results for input(s): "NA", "K", "CL", "CO2", "GLUCOSE", "BUN", "CREATININE", "CALCIUM" in the last 72 hours. LFT No results for input(s): "PROT", "ALBUMIN", "AST", "ALT", "ALKPHOS", "BILITOT", "BILIDIR", "IBILI" in the last 72 hours. PT/INR No results for input(s): "LABPROT", "INR" in the  last 72 hours.   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  The risks of an EUS including intestinal perforation, bleeding, infection, aspiration, and medication effects were discussed as was the possibility it may not give a definitive diagnosis if a biopsy is performed.    The risks and benefits of endoscopic evaluation/treatment were discussed with the patient and/or family; these include but are not limited to the risk of perforation, infection, bleeding, missed lesions, lack of diagnosis, severe illness requiring hospitalization, as well as anesthesia and sedation related illnesses.  The patient's history has been reviewed, patient examined, no change in status, and deemed stable for procedure.  The patient and/or family is agreeable to proceed.    Corliss Parish, MD [facility]oscopy Office # [phone]

## 2023-05-08 ENCOUNTER — Telehealth: Payer: Self-pay

## 2023-05-08 LAB — SURGICAL PATHOLOGY

## 2023-05-08 NOTE — Telephone Encounter (Signed)
-----   Message from [employer] T. Russella Dar sent at 05/07/2023 11:31 AM EST ----- Regarding: RE: [facility] Patient GM, Hope you are well. Agree with EGD which I am happy to do if I have availability otherwise GM. Thanks, MS ----- Message ----- From: Lemar Lofty., MD Sent: 05/07/2023   8:29 AM EST To: Meryl Dare, MD; Loretha Stapler, RN Subject: [facility] Patient                                 MS, [facility] you are well. This patient came in for her lower EUS follow-up of the previous rectal neuroendocrine tumor.  About to do her procedure. She did tell me that she has had a few months ago a couple episodes of dark tarry stool.  This subsided.  She is on NSAIDs.  No GI discomfort.  Patient however does have evidence of anemia and iron deficiency.  She still has menstrual periods. Most likely this is just menstrual bleeding but it is a bit more significant, so I told her that it may make sense for 1 of [place] to do an upper endoscopy on her. If you agree, then we can have Victorine Mcnee schedule.  I think that can be done in the [facility].  I am not sure who will have earlier availability, but if you agree then lets move forward with that. Patient agrees with this plan of action. I'll update you with Rectal NET followup. Thanks. GM

## 2023-05-08 NOTE — Telephone Encounter (Signed)
Progress Notes - documented in this encounter  Georgina Pillion, LPN - 72/53/6644 11:21 AM EDT Formatting of this note might be different from the original. Cardio clearance received back regarding patient's Plavix. Instructions given from patient's PCP for patient to hold Plavix 5 days prior to surgery. Patient surgery on 04/02/2023. Patient called and informed patient should not take Plavix starting 03/28/23 and should hold the day of surgery. Patient verbalized understanding and had no further questions. Electronically signed by Georgina Pillion, LPN at 03/47/4259 11:27 AM EDT

## 2023-05-09 ENCOUNTER — Encounter (HOSPITAL_COMMUNITY): Payer: Self-pay | Admitting: Gastroenterology

## 2023-05-09 ENCOUNTER — Encounter: Payer: Self-pay | Admitting: Gastroenterology

## 2023-05-15 ENCOUNTER — Telehealth: Payer: Self-pay | Admitting: Gastroenterology

## 2023-05-15 NOTE — Telephone Encounter (Signed)
The pt has been scheduled for EGD with MS on 06/22/23. Instructions given over the phone, sent to My Chart and mailed. She will hold plavix 5 days prior to the procedure.

## 2023-05-15 NOTE — Telephone Encounter (Signed)
PT wants to know if she is supposed to schedule another appointment for an EGD per Dr. Elesa Hacker request after having EUS. Please advise.

## 2023-06-22 ENCOUNTER — Encounter: Payer: Self-pay | Admitting: Gastroenterology

## 2023-06-22 ENCOUNTER — Telehealth: Payer: Self-pay

## 2023-06-22 ENCOUNTER — Ambulatory Visit: Payer: BC Managed Care – PPO | Admitting: Gastroenterology

## 2023-06-22 VITALS — BP 138/92 | HR 77 | Temp 98.2°F | Resp 14 | Ht 67.0 in | Wt 199.0 lb

## 2023-06-22 DIAGNOSIS — K449 Diaphragmatic hernia without obstruction or gangrene: Secondary | ICD-10-CM | POA: Diagnosis not present

## 2023-06-22 DIAGNOSIS — D509 Iron deficiency anemia, unspecified: Secondary | ICD-10-CM

## 2023-06-22 DIAGNOSIS — K921 Melena: Secondary | ICD-10-CM

## 2023-06-22 DIAGNOSIS — K2100 Gastro-esophageal reflux disease with esophagitis, without bleeding: Secondary | ICD-10-CM

## 2023-06-22 MED ORDER — FAMOTIDINE 40 MG PO TABS
40.0000 mg | ORAL_TABLET | Freq: Every day | ORAL | 11 refills | Status: AC
Start: 2023-06-22 — End: ?

## 2023-06-22 MED ORDER — SODIUM CHLORIDE 0.9 % IV SOLN: 500.0000 mL | Freq: Once | INTRAVENOUS | Status: DC

## 2023-06-22 NOTE — Op Note (Addendum)
[facility] Patient Name: Maria Lang Procedure Date: 06/22/2023 9:33 AM MRN: [id number] Endoscopist: Meryl Dare , MD, [phone] Age: 42 Referring MD:  Date of Birth: October 02, 1980 Gender: Female Account #: [id number] Procedure:                Upper GI endoscopy Indications:              Iron deficiency anemia, Melena Medicines:                Monitored Anesthesia Care Procedure:                Pre-Anesthesia Assessment:                           - Prior to the procedure, a History and Physical                            was performed, and patient medications and                            allergies were reviewed. The patient's tolerance of                            previous anesthesia was also reviewed. The risks                            and benefits of the procedure and the sedation                            options and risks were discussed with the patient.                            All questions were answered, and informed consent                            was obtained. Prior Anticoagulants: The patient has                            taken Plavix (clopidogrel), last dose was 5 days                            prior to procedure. ASA Grade Assessment: III - A                            patient with severe systemic disease. After                            reviewing the risks and benefits, the patient was                            deemed in satisfactory condition to undergo the                            procedure.  After obtaining informed consent, the endoscope was                            passed under direct vision. Throughout the                            procedure, the patient's blood pressure, pulse, and                            oxygen saturations were monitored continuously. The                            GIF [id number] #[id number] was introduced through the                            mouth, and advanced to the second part of  duodenum.                            The upper GI endoscopy was accomplished without                            difficulty. The patient tolerated the procedure                            well. Scope In: Scope Out: Findings:                 LA Grade A (one or more mucosal breaks less than 5                            mm, not extending between tops of 2 mucosal folds)                            esophagitis with no bleeding was found in the                            distal esophagus.                           The exam of the esophagus was otherwise normal.                           A small hiatal hernia was present.                           The gastroesophageal flap valve was visualized                            endoscopically and classified as Hill Grade II                            (fold present, opens with respiration).                           The exam of the stomach  was otherwise normal.                           The duodenal bulb and second portion of the                            duodenum were normal. Biopsies for histology were                            taken with a cold forceps for evaluation of celiac                            disease. Complications:            No immediate complications. Estimated Blood Loss:     Estimated blood loss was minimal. Impression:               - LA Grade A reflux esophagitis with no bleeding.                           - Small hiatal hernia.                           - Gastroesophageal flap valve classified as Hill                            Grade II (fold present, opens with respiration).                           - Normal duodenal bulb and second portion of the                            duodenum. Biopsied. Recommendation:           - Patient has a contact number available for                            emergencies. The signs and symptoms of potential                            delayed complications were discussed with the                             patient. Return to normal activities tomorrow.                            Written discharge instructions were provided to the                            patient.                           - Resume previous diet.                           - Follow antireflux measures.                           -  Continue present medications including                            pantoprazole 40 mg po bid.                           - Start famotidine 40 mg po hs, 1 year of refills.                           - Resume Plavix (clopidogrel) at prior dose                            tomorrow. Refer to managing physician for further                            adjustment of therapy.                           - Await pathology results.                           - Schedule VCE.                           - GI follow up with Dr. Meridee Score. Meryl Dare, MD 06/22/2023 9:56:49 AM This report has been signed electronically.

## 2023-06-22 NOTE — Progress Notes (Signed)
History & Physical  Primary Care Physician:  [name], [name], PA-C Primary Gastroenterologist: Claudette Head, MD  Impression / Plan:  Iron deficiency anemia, history of intermittent dark stools, query melena, for EGD.  CHIEF COMPLAINT:  CRC screening, Personal history of colon polyps   HPI: Maria Lang is a 42 y.o. female with iron deficiency anemia and a history of intermittent dark stools, query melena, for EGD.    Past Medical History:  Diagnosis Date   Anxiety    Deviated septum    GERD (gastroesophageal reflux disease)    Hearing aid worn    bilateral   History of bilateral salpingectomy    Hyperlipidemia    Hypertension    Renal stones    Sleep apnea    Doesn't use everyday   Stroke [facility])    Thyroid disease     Past Surgical History:  Procedure Laterality Date   BILATERAL SALPINGECTOMY  07.2016   CYSTOSCOPY WITH RETROGRADE PYELOGRAM, URETEROSCOPY AND STENT PLACEMENT Right 07/29/2015   Procedure: CYSTOSCOPY WITH RIGHT RETROGRADE PYELOGRAM, RIGHT URETEROSCOPY AND STENT PLACEMENT;  Surgeon: Bjorn Pippin, MD;  Location: [facility];  Service: Urology;  Laterality: Right;   ECTOPIC PREGNANCY SURGERY  10/2013   ENDOSCOPIC MUCOSAL RESECTION  02/13/2022   Procedure: ENDOSCOPIC MUCOSAL RESECTION;  Surgeon: Lemar Lofty., MD;  Location: [facility];  Service: Gastroenterology;;   EUS N/A 02/13/2022   Procedure: LOWER ENDOSCOPIC ULTRASOUND (EUS);  Surgeon: Lemar Lofty., MD;  Location: [facility];  Service: Gastroenterology;  Laterality: N/A;   EUS N/A 05/07/2023   Procedure: LOWER ENDOSCOPIC ULTRASOUND (EUS);  Surgeon: Lemar Lofty., MD;  Location: [facility];  Service: Gastroenterology;  Laterality: N/A;   FLEXIBLE SIGMOIDOSCOPY N/A 02/13/2022   Procedure: FLEXIBLE SIGMOIDOSCOPY;  Surgeon: Meridee Score Netty Starring., MD;  Location: [facility];  Service: Gastroenterology;  Laterality: N/A;   FLEXIBLE SIGMOIDOSCOPY N/A 05/07/2023   Procedure:  FLEXIBLE SIGMOIDOSCOPY;  Surgeon: Meridee Score Netty Starring., MD;  Location: [facility];  Service: Gastroenterology;  Laterality: N/A;   HEMOSTASIS CLIP PLACEMENT  02/13/2022   Procedure: HEMOSTASIS CLIP PLACEMENT;  Surgeon: Lemar Lofty., MD;  Location: [facility];  Service: Gastroenterology;;   HOLMIUM LASER APPLICATION Right 07/29/2015   Procedure: HOLMIUM LASER APPLICATION;  Surgeon: Bjorn Pippin, MD;  Location: [facility];  Service: Urology;  Laterality: Right;   NASAL SEPTUM SURGERY     doen along with tonsillectomy    POLYPECTOMY  05/07/2023   Procedure: POLYPECTOMY;  Surgeon: Mansouraty, Netty Starring., MD;  Location: [facility];  Service: Gastroenterology;;   Sunnie Nielsen LIFTING INJECTION  02/13/2022   Procedure: SUBMUCOSAL LIFTING INJECTION;  Surgeon: Lemar Lofty., MD;  Location: [facility];  Service: Gastroenterology;;   TONSILLECTOMY     TYMPANOSTOMY     x 3    Prior to Admission medications   Medication Sig Start Date End Date Taking? Authorizing Provider  ALPRAZolam Prudy Feeler) 1 MG tablet Take 0.5-1 tablets by mouth 3 (three) times daily as needed for anxiety. 01/01/17  Yes [provider]  amLODipine (NORVASC) 10 MG tablet Take 10 mg by mouth daily.   Yes [provider]  atorvastatin (LIPITOR) 80 MG tablet Take 80 mg by mouth daily.   Yes [provider]  ezetimibe (ZETIA) 10 MG tablet Take 10 mg by mouth daily. 11/14/21  Yes [provider]  gabapentin (NEURONTIN) 300 MG capsule Take 300 mg by mouth daily as needed (nerve pain). 10/18/21  Yes [provider]  levothyroxine (SYNTHROID) 75 MCG tablet  Take 75 mcg by mouth daily before breakfast. 02/28/19  Yes [provider]  losartan-hydrochlorothiazide (HYZAAR) 100-12.5 MG tablet TAKE 1 TABLET BY MOUTH EVERY DAY 07/03/22  Yes Branch, Dorothe Pea, MD  ondansetron (ZOFRAN-ODT) 8 MG disintegrating tablet Take 8 mg by mouth every 8 (eight) hours as needed for nausea/vomiting.  01/10/22  Yes [provider]  pantoprazole (PROTONIX) 40 MG tablet TAKE 1 TABLET BY MOUTH TWICE DAILY BEFORE BREAKFAST AND DINNER Patient taking differently: Take 80 mg by mouth daily. 11/04/18  Yes Meryl Dare, MD  propranolol ER (INDERAL LA) 60 MG 24 hr capsule Take 60 mg by mouth daily.   Yes [provider]  sertraline (ZOLOFT) 100 MG tablet Take 100 mg by mouth daily. 10/01/18  Yes [provider]  albuterol (VENTOLIN HFA) 108 (90 Base) MCG/ACT inhaler Inhale 2 puffs into the lungs every 4 (four) hours as needed for wheezing or shortness of breath. Patient not taking: Reported on 06/22/2023 09/07/22   Dione Booze, MD  clopidogrel (PLAVIX) 75 MG tablet Take 1 tablet (75 mg total) by mouth daily. 05/08/23   Mansouraty, Netty Starring., MD  dicyclomine (BENTYL) 20 MG tablet Take 1 tablet (20 mg total) by mouth every 6 (six) hours. Patient not taking: Reported on 06/06/2022 02/13/22   Mansouraty, Netty Starring., MD    Current Outpatient Medications  Medication Sig Dispense Refill   ALPRAZolam (XANAX) 1 MG tablet Take 0.5-1 tablets by mouth 3 (three) times daily as needed for anxiety.  1   amLODipine (NORVASC) 10 MG tablet Take 10 mg by mouth daily.     atorvastatin (LIPITOR) 80 MG tablet Take 80 mg by mouth daily.     ezetimibe (ZETIA) 10 MG tablet Take 10 mg by mouth daily.     gabapentin (NEURONTIN) 300 MG capsule Take 300 mg by mouth daily as needed (nerve pain).     levothyroxine (SYNTHROID) 75 MCG tablet Take 75 mcg by mouth daily before breakfast.     losartan-hydrochlorothiazide (HYZAAR) 100-12.5 MG tablet TAKE 1 TABLET BY MOUTH EVERY DAY 30 tablet 0   ondansetron (ZOFRAN-ODT) 8 MG disintegrating tablet Take 8 mg by mouth every 8 (eight) hours as needed for nausea/vomiting.     pantoprazole (PROTONIX) 40 MG tablet TAKE 1 TABLET BY MOUTH TWICE DAILY BEFORE BREAKFAST AND DINNER (Patient taking differently: Take 80 mg by mouth daily.) 180 tablet 0   propranolol ER  (INDERAL LA) 60 MG 24 hr capsule Take 60 mg by mouth daily.     sertraline (ZOLOFT) 100 MG tablet Take 100 mg by mouth daily.     albuterol (VENTOLIN HFA) 108 (90 Base) MCG/ACT inhaler Inhale 2 puffs into the lungs every 4 (four) hours as needed for wheezing or shortness of breath. (Patient not taking: Reported on 06/22/2023) 1 each 0   clopidogrel (PLAVIX) 75 MG tablet Take 1 tablet (75 mg total) by mouth daily. 30 tablet 1   dicyclomine (BENTYL) 20 MG tablet Take 1 tablet (20 mg total) by mouth every 6 (six) hours. (Patient not taking: Reported on 06/06/2022) 10 tablet 0   Current Facility-Administered Medications  Medication Dose Route Frequency Provider Last Rate Last Admin   0.9 %  sodium chloride infusion  500 mL Intravenous Once Meryl Dare, MD        Allergies as of 06/22/2023 - Review Complete 06/22/2023  Allergen Reaction Noted   Hydrocodeine [dihydrocodeine] Shortness Of Breath and Nausea And Vomiting 06/04/2015   Hydrocodone-acetaminophen Shortness Of Breath and Nausea  Only 02/17/2016   Coreg [carvedilol]  12/10/2014   Labetalol Other (See Comments) 02/17/2016    Family History  Problem Relation Age of Onset   Hypertension Mother    Hypertension Father    Hyperlipidemia Father    Diabetes Brother    Diabetes Other    Colon cancer Neg Hx    Colon polyps Neg Hx    Esophageal cancer Neg Hx    Rectal cancer Neg Hx    Stomach cancer Neg Hx     Social History   Socioeconomic History   Marital status: Married    Spouse name: Not on file   Number of children: Not on file   Years of education: Not on file   Highest education level: Not on file  Occupational History   Not on file  Tobacco Use   Smoking status: Former    Current packs/day: 0.00    Average packs/day: 0.3 packs/day for 22.5 years (5.6 ttl pk-yrs)    Types: Cigarettes    Start date: 10/04/1997    Quit date: 03/26/2020    Years since quitting: 3.2   Smokeless tobacco: Never  Vaping Use   Vaping  status: Some Days  Substance and Sexual Activity   Alcohol use: No   Drug use: Never   Sexual activity: Yes    Birth control/protection: None, Surgical  Other Topics Concern   Not on file  Social History Narrative   Not on file   Social Drivers of Health   Financial Resource Strain: Medium Risk (12/06/2022)   Received from [facility], [facility]   Overall Financial Resource Strain (CARDIA)    Difficulty of Paying Living Expenses: Somewhat hard  Food Insecurity: Patient Declined (12/06/2022)   Received from [facility], [facility]   Hunger Vital Sign    Worried About Running Out of Food in the Last Year: Patient declined    Ran Out of Food in the Last Year: Patient declined  Transportation Needs: No Transportation Needs (12/06/2022)   Received from [employer], [facility]   PRAPARE - Transportation    Lack of Transportation (Medical): No    Lack of Transportation (Non-Medical): No  Physical Activity: Insufficiently Active (12/06/2022)   Received from [facility], [facility]   Exercise Vital Sign    Days of Exercise per Week: 3 days    Minutes of Exercise per Session: 10 min  Stress: Stress Concern Present (12/06/2022)   Received from [facility], [facility] Occupational Health - Occupational Stress Questionnaire    Feeling of Stress : To some extent  Social Connections: Socially Isolated (12/06/2022)   Received from [facility], [facility]   Social Network    How would you rate your social network (family, work, friends)?: Little participation, lonely and socially isolated  Intimate Partner Violence: Not At Risk (12/06/2022)   Received from [facility], [facility]   HITS    Over the last 12 months how often did your partner physically hurt you?: Never    Over the last 12 months how often did your partner insult you or talk down to you?: Never    Over the last 12 months how often did your partner threaten you with physical  harm?: Never    Over the last 12 months how often did your partner scream or curse at you?: Never    Review of Systems:  All systems reviewed were negative except where noted in HPI.   Physical  Exam:  General:  Alert, well-developed, in NAD Head:  Normocephalic and atraumatic. Eyes:  Sclera clear, no icterus.   Conjunctiva pink. Ears:  Normal auditory acuity. Mouth:  No deformity or lesions.  Neck:  Supple; no masses. Lungs:  Clear throughout to auscultation.   No wheezes, crackles, or rhonchi.  Heart:  Regular rate and rhythm; no murmurs. Abdomen:  Soft, nondistended, nontender. No masses, hepatomegaly. No palpable masses.  Normal bowel sounds.    Rectal:  Deferred   Msk:  Symmetrical without gross deformities. Extremities:  Without edema. Neurologic:  Alert and  oriented x 4; grossly normal neurologically. Skin:  Intact without significant lesions or rashes. Psych:  Alert and cooperative. Normal mood and affect.   Venita Lick. Russella Dar  06/22/2023, 9:30 AM See Loretha Stapler, [facility], to contact our on call provider

## 2023-06-22 NOTE — Progress Notes (Signed)
Sedate, gd SR, tolerated procedure well, VSS, report to RN 

## 2023-06-22 NOTE — Telephone Encounter (Signed)
Pharmacy Patient Advocate Encounter   Received notification from CoverMyMeds that prior authorization for Famotidine 40 mg tablets is required/requested.   Insurance verification completed.   The patient is insured through [facility] .   Per test claim: PA required; PA submitted to above mentioned insurance via CoverMyMeds Key/confirmation #/EOC [id number] Status is pending

## 2023-06-22 NOTE — Patient Instructions (Signed)
Resume Plavix tomorrow at prior dosage  Continue previous diet & medications  Take Pantoprazole 40 mg twice a day as already ordered ,take 40 mg in am and 40 mg in the evening   Start Famotidine 40 mg at night   VCE will be scheduled by Dr Ardell Isaacs office   Follow anti reflux instructions ( handout given to you today)   YOU HAD AN ENDOSCOPIC PROCEDURE TODAY AT THE [facility]:   Refer to the procedure report that was given to you for any specific questions about what was found during the examination.  If the procedure report does not answer your questions, please call your gastroenterologist to clarify.  If you requested that your care partner not be given the details of your procedure findings, then the procedure report has been included in a sealed envelope for you to review at your convenience later.  YOU SHOULD EXPECT: Some feelings of bloating in the abdomen. Passage of more gas than usual.  Walking can help get rid of the air that was put into your GI tract during the procedure and reduce the bloating. If you had a lower endoscopy (such as a colonoscopy or flexible sigmoidoscopy) you may notice spotting of blood in your stool or on the toilet paper. If you underwent a bowel prep for your procedure, you may not have a normal bowel movement for a few days.  Please Note:  You might notice some irritation and congestion in your nose or some drainage.  This is from the oxygen used during your procedure.  There is no need for concern and it should clear up in a day or so.  SYMPTOMS TO REPORT IMMEDIATELY:    Following upper endoscopy (EGD)  Vomiting of blood or coffee ground material  New chest pain or pain under the shoulder blades  Painful or persistently difficult swallowing  New shortness of breath  Fever of 100F or higher  Black, tarry-looking stools  For urgent or emergent issues, a gastroenterologist can be reached at any hour by calling [phone]. Do not  use MyChart messaging for urgent concerns.    DIET:  We do recommend a small meal at first, but then you may proceed to your regular diet.  Drink plenty of fluids but you should avoid alcoholic beverages for 24 hours.  ACTIVITY:  You should plan to take it easy for the rest of today and you should NOT DRIVE or use heavy machinery until tomorrow (because of the sedation medicines used during the test).    FOLLOW UP: Our staff will call the number listed on your records the next business day following your procedure.  We will call around 7:15- 8:00 am to check on you and address any questions or concerns that you may have regarding the information given to you following your procedure. If we do not reach you, we will leave a message.     If any biopsies were taken you will be contacted by phone or by letter within the next 1-3 weeks.  Please call us at [phone] if you have not heard about the biopsies in 3 weeks.    SIGNATURES/CONFIDENTIALITY: You and/or your care partner have signed paperwork which will be entered into your electronic medical record.  These signatures attest to the fact that that the information above on your After Visit Summary has been reviewed and is understood.  Full responsibility of the confidentiality of this discharge information lies with you and/or your care-partner.

## 2023-06-25 ENCOUNTER — Telehealth: Payer: Self-pay | Admitting: *Deleted

## 2023-06-25 ENCOUNTER — Other Ambulatory Visit (HOSPITAL_COMMUNITY): Payer: Self-pay

## 2023-06-25 NOTE — Telephone Encounter (Signed)
Pharmacy Patient Advocate Encounter  Received notification from [facility] that Prior Authorization for Famotidine 40MG  tablets has been DENIED.  Full denial letter will be uploaded to the media tab. See denial reason below.  This health benefit plan does not cover the following services, supplies, drugs or charges: Any drug that is therapeutically equivalent to an over-the-counter drug where the over-the-counter products contain the same active ingredients as the prescription product at the same, or similar strengths - OR - Drugs that are purchased over-the-counter, unless specifically listed as a covered drug in the formulary and a written prescription is provided  PA #/Case ID/Reference #: [id number]

## 2023-06-25 NOTE — Telephone Encounter (Signed)
Left message on f/u call 

## 2023-06-26 ENCOUNTER — Telehealth: Payer: Self-pay

## 2023-06-26 DIAGNOSIS — K921 Melena: Secondary | ICD-10-CM

## 2023-06-26 DIAGNOSIS — D509 Iron deficiency anemia, unspecified: Secondary | ICD-10-CM

## 2023-06-26 NOTE — Telephone Encounter (Signed)
 I spoke with Maria Lang and set her up for a VCE study 07/16/2023 per Dr Aneita. Made her a follow up appointment with Dr Wilhelmenia per Dr Albin instructions. I will place the capsule form in Dr Mansouraty's office to be completed. I am mailing her the instructions. Confirmed her address.

## 2023-06-28 LAB — SURGICAL PATHOLOGY

## 2023-06-28 NOTE — Telephone Encounter (Signed)
 When I return to the office, I will sign any additional instructions. As Dr. Russella Dar has retired, I will take over this patient's care. GM

## 2023-06-29 ENCOUNTER — Telehealth: Payer: Self-pay

## 2023-06-29 ENCOUNTER — Encounter: Payer: Self-pay | Admitting: Gastroenterology

## 2023-06-29 DIAGNOSIS — A048 Other specified bacterial intestinal infections: Secondary | ICD-10-CM | POA: Insufficient documentation

## 2023-06-29 NOTE — Telephone Encounter (Signed)
 Form has been completed and placed in Amy Esterwood's office for the upcoming capsule study.

## 2023-06-29 NOTE — Telephone Encounter (Signed)
 [employer] ordered  Form completed and left at the front desk on the 2 nd floor.   Left message on machine to call back

## 2023-06-29 NOTE — Telephone Encounter (Signed)
 Maria Lang, please set up with patient Maria Lang testing (she is on PPI therapy).  There is a VCE ordered that is already in place, I signed what was left for me at my desk yesterday.

## 2023-07-02 NOTE — Telephone Encounter (Signed)
 Spoke with the pt and she has been advised of the pathology and [employer] order. She will pick up the kit as soon as able.

## 2023-07-16 ENCOUNTER — Ambulatory Visit: Payer: BC Managed Care – PPO | Admitting: Gastroenterology

## 2023-07-16 DIAGNOSIS — D500 Iron deficiency anemia secondary to blood loss (chronic): Secondary | ICD-10-CM

## 2023-07-16 DIAGNOSIS — D509 Iron deficiency anemia, unspecified: Secondary | ICD-10-CM | POA: Diagnosis not present

## 2023-07-16 NOTE — Patient Instructions (Signed)
POST CAPSULE INSTRUCTIONS:   Contact our office immediately at [phone] if you suffer from any abdominal pain, nausea, or vomiting during capsule endoscopy. Do not eat or drink for at least 2 hours. After 2 hours you may have any of the following to drink: Water                           White grape juice 7-Up                            Chicken Bouillon Sprite                           Ginger Ale  After 4 hours you may have a light snack to include any of the following: A cup of soup               sandwich Bowl of cereal             Rice Toast                           Eggs 2-3 small cookies (i.e. vanilla wafers or graham crackers)  Return to this office at 4:00 pm. After this you will be able to return to your normal diet.  During your procedure do not go near anyone else that is having capsule endoscopy.  Do not be in close contact with an MRI machine or a radio or television tower.   Do not wear a heavy coat or sweater because your recorder may over heat and stop recording.   Do not disconnect the equipment or remove the belt at any time.  Since the Data Recorder is actually a small computer, it should be treated with utmost care and protection.  Avoid sudden movement and banging of the Data Recorder.    Do not do any heavy lifting or strenuous physical activity during the test especially if it involves sweating.  Do not bend over or stoop during capsule endoscopy.  During capsule endoscopy, you will need to verify every 15 minutes that the small light on top of the Data Recorder is blinking twice per second.  If for some reason it stops blinking, record the time and contact our office at [phone].                     

## 2023-07-16 NOTE — Progress Notes (Signed)
CAPSULE ID: [id number] Exp: 2023-09-04 LOT: [id number]  Patient arrived for capsule endoscopy. Reported the prep went well. Confirmed patient is fasting. Explained dietary restrictions for the next few hours. Patient verbalized understanding. Opened capsule, ensured capsule was flashing and transmitting to the recorder prior to the patient swallowing the capsule. Patient swallowed capsule without difficulty. Patient told to call the office with any questions. Understands to return to the office today between 4:00 and 4:30 pm. No further questions by the conclusion of the visit.

## 2023-07-26 ENCOUNTER — Telehealth: Payer: Self-pay

## 2023-07-26 DIAGNOSIS — D500 Iron deficiency anemia secondary to blood loss (chronic): Secondary | ICD-10-CM

## 2023-07-26 NOTE — Telephone Encounter (Signed)
-----   Message from Mike Gip sent at 07/26/2023 11:17 AM EST ----- Regarding: Capsule Capsule ready on this pt - done for IDA.  Negative study .  Dr Myrtie Neither - you are reading for January - I put the reports on your desk   Jodel Mayhall - can let pt know the Capsule was negative  She needs a CBC, and Ferritin, Fe/TIBC/ Iron Sat.. thanks

## 2023-07-26 NOTE — Telephone Encounter (Signed)
The pt has been advised of the results and agrees to come in for labs at her convenience.

## 2023-07-31 ENCOUNTER — Other Ambulatory Visit: Payer: BC Managed Care – PPO

## 2023-07-31 DIAGNOSIS — D500 Iron deficiency anemia secondary to blood loss (chronic): Secondary | ICD-10-CM | POA: Diagnosis not present

## 2023-07-31 LAB — CBC WITH DIFFERENTIAL/PLATELET
Basophils Absolute: 0 10*3/uL (ref 0.0–0.1)
Basophils Relative: 0.6 % (ref 0.0–3.0)
Eosinophils Absolute: 0 10*3/uL (ref 0.0–0.7)
Eosinophils Relative: 0.1 % (ref 0.0–5.0)
HCT: 30.3 % — ABNORMAL LOW (ref 36.0–46.0)
Hemoglobin: 9.3 g/dL — ABNORMAL LOW (ref 12.0–15.0)
Lymphocytes Relative: 36.5 % (ref 12.0–46.0)
Lymphs Abs: 2.3 10*3/uL (ref 0.7–4.0)
MCHC: 30.7 g/dL (ref 30.0–36.0)
MCV: 74.4 fL — ABNORMAL LOW (ref 78.0–100.0)
Monocytes Absolute: 0.4 10*3/uL (ref 0.1–1.0)
Monocytes Relative: 7 % (ref 3.0–12.0)
Neutro Abs: 3.6 10*3/uL (ref 1.4–7.7)
Neutrophils Relative %: 55.8 % (ref 43.0–77.0)
Platelets: 328 10*3/uL (ref 150.0–400.0)
RBC: 4.07 Mil/uL (ref 3.87–5.11)
RDW: 17 % — ABNORMAL HIGH (ref 11.5–15.5)
WBC: 6.4 10*3/uL (ref 4.0–10.5)

## 2023-07-31 LAB — IBC + FERRITIN
Ferritin: 5.1 ng/mL — ABNORMAL LOW (ref 10.0–291.0)
Iron: 34 ug/dL — ABNORMAL LOW (ref 42–145)
Saturation Ratios: 6.9 % — ABNORMAL LOW (ref 20.0–50.0)
TIBC: 490 ug/dL — ABNORMAL HIGH (ref 250.0–450.0)
Transferrin: 350 mg/dL (ref 212.0–360.0)

## 2023-08-01 DIAGNOSIS — D509 Iron deficiency anemia, unspecified: Secondary | ICD-10-CM

## 2023-08-02 ENCOUNTER — Other Ambulatory Visit: Payer: Self-pay

## 2023-08-02 DIAGNOSIS — D500 Iron deficiency anemia secondary to blood loss (chronic): Secondary | ICD-10-CM

## 2023-08-03 ENCOUNTER — Encounter: Payer: Self-pay | Admitting: Gastroenterology

## 2023-09-04 ENCOUNTER — Inpatient Hospital Stay: Payer: BC Managed Care – PPO | Attending: Hematology | Admitting: Hematology

## 2023-09-04 ENCOUNTER — Other Ambulatory Visit: Payer: Self-pay

## 2023-09-04 ENCOUNTER — Encounter: Payer: Self-pay | Admitting: Hematology

## 2023-09-04 ENCOUNTER — Inpatient Hospital Stay: Payer: BC Managed Care – PPO

## 2023-09-04 VITALS — BP 156/98 | HR 79 | Temp 98.0°F | Resp 20 | Ht 67.0 in | Wt 200.2 lb

## 2023-09-04 DIAGNOSIS — E039 Hypothyroidism, unspecified: Secondary | ICD-10-CM | POA: Insufficient documentation

## 2023-09-04 DIAGNOSIS — Z7902 Long term (current) use of antithrombotics/antiplatelets: Secondary | ICD-10-CM | POA: Diagnosis not present

## 2023-09-04 DIAGNOSIS — Z79890 Hormone replacement therapy (postmenopausal): Secondary | ICD-10-CM | POA: Diagnosis not present

## 2023-09-04 DIAGNOSIS — I10 Essential (primary) hypertension: Secondary | ICD-10-CM | POA: Insufficient documentation

## 2023-09-04 DIAGNOSIS — N920 Excessive and frequent menstruation with regular cycle: Secondary | ICD-10-CM | POA: Insufficient documentation

## 2023-09-04 DIAGNOSIS — D500 Iron deficiency anemia secondary to blood loss (chronic): Secondary | ICD-10-CM | POA: Diagnosis not present

## 2023-09-04 DIAGNOSIS — Z8673 Personal history of transient ischemic attack (TIA), and cerebral infarction without residual deficits: Secondary | ICD-10-CM | POA: Insufficient documentation

## 2023-09-04 DIAGNOSIS — Z79899 Other long term (current) drug therapy: Secondary | ICD-10-CM | POA: Diagnosis not present

## 2023-09-04 LAB — CBC WITH DIFFERENTIAL (CANCER CENTER ONLY)
Abs Immature Granulocytes: 0.02 10*3/uL (ref 0.00–0.07)
Basophils Absolute: 0 10*3/uL (ref 0.0–0.1)
Basophils Relative: 1 %
Eosinophils Absolute: 0 10*3/uL (ref 0.0–0.5)
Eosinophils Relative: 1 %
HCT: 31.5 % — ABNORMAL LOW (ref 36.0–46.0)
Hemoglobin: 9.3 g/dL — ABNORMAL LOW (ref 12.0–15.0)
Immature Granulocytes: 0 %
Lymphocytes Relative: 37 %
Lymphs Abs: 2.1 10*3/uL (ref 0.7–4.0)
MCH: 22.9 pg — ABNORMAL LOW (ref 26.0–34.0)
MCHC: 29.5 g/dL — ABNORMAL LOW (ref 30.0–36.0)
MCV: 77.6 fL — ABNORMAL LOW (ref 80.0–100.0)
Monocytes Absolute: 0.4 10*3/uL (ref 0.1–1.0)
Monocytes Relative: 7 %
Neutro Abs: 3 10*3/uL (ref 1.7–7.7)
Neutrophils Relative %: 54 %
Platelet Count: 279 10*3/uL (ref 150–400)
RBC: 4.06 MIL/uL (ref 3.87–5.11)
RDW: 15.9 % — ABNORMAL HIGH (ref 11.5–15.5)
WBC Count: 5.5 10*3/uL (ref 4.0–10.5)
nRBC: 0 % (ref 0.0–0.2)

## 2023-09-04 LAB — SAMPLE TO BLOOD BANK

## 2023-09-04 LAB — IRON AND IRON BINDING CAPACITY (CC-WL,HP ONLY)
Iron: 31 ug/dL (ref 28–170)
Saturation Ratios: 6 % — ABNORMAL LOW (ref 10.4–31.8)
TIBC: 510 ug/dL — ABNORMAL HIGH (ref 250–450)
UIBC: 479 ug/dL — ABNORMAL HIGH (ref 148–442)

## 2023-09-04 LAB — RETIC PANEL
Immature Retic Fract: 28 % — ABNORMAL HIGH (ref 2.3–15.9)
RBC.: 4.05 MIL/uL (ref 3.87–5.11)
Retic Count, Absolute: 98.4 10*3/uL (ref 19.0–186.0)
Retic Ct Pct: 2.4 % (ref 0.4–3.1)
Reticulocyte Hemoglobin: 23.8 pg — ABNORMAL LOW (ref >27.9)

## 2023-09-04 NOTE — Progress Notes (Signed)
 [facility]   [phone] [phone]   Clinic New Consult Note   Patient Care Team: [name], [name], PA-C as PCP - General (Internal Medicine) Antoine Poche, MD as PCP - Cardiology (Cardiology) 09/04/2023  CHIEF COMPLAINTS/PURPOSE OF CONSULTATION:  Iron deficient anemia  REFERRING PHYSICIAN: GI Dr. Meridee Score  Discussed the use of AI scribe software for clinical note transcription with the patient, who gave verbal consent to proceed.  History of Present Illness   Maria Lang, a 43 year old female with a history of stroke, and iron deficiency anemia, presents for a new consult for anemia.  She was referred by GI Dr. Irving Burton.  She presents to clinic alone.   She has a mild anemia with hemoglobin 11-12 for many years in the past.  However her hemoglobin has dropped down to 9-10 in the past year and a half.  She reports significant heavy menstrual bleeding in the past few years.  She has been taking over-the-counter iron supplements for over a year, but her anemia has been worsening. She reports persistent fatigue and decreased energy levels, which have led to her being largely homebound. She estimates that she has been feeling this way for over a year, possibly longer.  In addition to her anemia, Maria Lang reports heavy menstrual bleeding, which has been progressively worsening over the past year. She describes needing to use both a tampon and a pad due to the heavy flow and also mentions passing large blood clots. She is scheduled for a consultation for a hysterectomy in the near future.  Maria Lang also mentions a history of high blood pressure and has experienced two strokes in the past, which she attributes to her blood pressure. She has been out of work for almost three years and has recently been approved for disability.     Her outside lab on July 31, 2023 showed hemoglobin 9.3, hematocrit 30.3, MCV 74.4.  Serum iron 34, ferritin 5.1, TIBC  elevated 490, with saturation 6.9%.    MEDICAL HISTORY:  Past Medical History:  Diagnosis Date   Anxiety    Deviated septum    GERD (gastroesophageal reflux disease)    Hearing aid worn    bilateral   History of bilateral salpingectomy    Hyperlipidemia    Hypertension    Renal stones    Sleep apnea    Doesn't use everyday   Stroke [facility])    Thyroid disease     SURGICAL HISTORY: Past Surgical History:  Procedure Laterality Date   BILATERAL SALPINGECTOMY  07.2016   CYSTOSCOPY WITH RETROGRADE PYELOGRAM, URETEROSCOPY AND STENT PLACEMENT Right 07/29/2015   Procedure: CYSTOSCOPY WITH RIGHT RETROGRADE PYELOGRAM, RIGHT URETEROSCOPY AND STENT PLACEMENT;  Surgeon: Bjorn Pippin, MD;  Location: [facility];  Service: Urology;  Laterality: Right;   ECTOPIC PREGNANCY SURGERY  10/2013   ENDOSCOPIC MUCOSAL RESECTION  02/13/2022   Procedure: ENDOSCOPIC MUCOSAL RESECTION;  Surgeon: Lemar Lofty., MD;  Location: [facility];  Service: Gastroenterology;;   EUS N/A 02/13/2022   Procedure: LOWER ENDOSCOPIC ULTRASOUND (EUS);  Surgeon: Lemar Lofty., MD;  Location: [facility];  Service: Gastroenterology;  Laterality: N/A;   EUS N/A 05/07/2023   Procedure: LOWER ENDOSCOPIC ULTRASOUND (EUS);  Surgeon: Lemar Lofty., MD;  Location: [facility];  Service: Gastroenterology;  Laterality: N/A;   FLEXIBLE SIGMOIDOSCOPY N/A 02/13/2022   Procedure: FLEXIBLE SIGMOIDOSCOPY;  Surgeon: Meridee Score Netty Starring., MD;  Location: [facility];  Service: Gastroenterology;  Laterality: N/A;   FLEXIBLE SIGMOIDOSCOPY N/A 05/07/2023  Procedure: FLEXIBLE SIGMOIDOSCOPY;  Surgeon: Lemar Lofty., MD;  Location: [facility];  Service: Gastroenterology;  Laterality: N/A;   HEMOSTASIS CLIP PLACEMENT  02/13/2022   Procedure: HEMOSTASIS CLIP PLACEMENT;  Surgeon: Lemar Lofty., MD;  Location: [facility];  Service: Gastroenterology;;   HOLMIUM LASER APPLICATION Right 07/29/2015   Procedure:  HOLMIUM LASER APPLICATION;  Surgeon: Bjorn Pippin, MD;  Location: [facility];  Service: Urology;  Laterality: Right;   NASAL SEPTUM SURGERY     doen along with tonsillectomy    POLYPECTOMY  05/07/2023   Procedure: POLYPECTOMY;  Surgeon: Mansouraty, Netty Starring., MD;  Location: [facility];  Service: Gastroenterology;;   Sunnie Nielsen LIFTING INJECTION  02/13/2022   Procedure: SUBMUCOSAL LIFTING INJECTION;  Surgeon: Lemar Lofty., MD;  Location: [facility];  Service: Gastroenterology;;   TONSILLECTOMY     TYMPANOSTOMY     x 3    SOCIAL HISTORY: Social History   Socioeconomic History   Marital status: Married    Spouse name: Not on file   Number of children: 3   Years of education: Not on file   Highest education level: Not on file  Occupational History   Not on file  Tobacco Use   Smoking status: Former    Current packs/day: 0.00    Average packs/day: 0.3 packs/day for 22.5 years (5.6 ttl pk-yrs)    Types: Cigarettes    Start date: 10/04/1997    Quit date: 03/26/2020    Years since quitting: 3.4   Smokeless tobacco: Never  Vaping Use   Vaping status: Some Days  Substance and Sexual Activity   Alcohol use: No   Drug use: Never   Sexual activity: Yes    Birth control/protection: None, Surgical  Other Topics Concern   Not on file  Social History Narrative   Not on file   Social Drivers of Health   Financial Resource Strain: Medium Risk (12/06/2022)   Received from [facility], [facility]   Overall Financial Resource Strain (CARDIA)    Difficulty of Paying Living Expenses: Somewhat hard  Food Insecurity: Patient Declined (12/06/2022)   Received from [facility], [facility]   Hunger Vital Sign    Worried About Running Out of Food in the Last Year: Patient declined    Ran Out of Food in the Last Year: Patient declined  Transportation Needs: No Transportation Needs (07/30/2023)   Received from [facility]   PRAPARE - Transportation    Lack of Transportation  (Medical): No    Lack of Transportation (Non-Medical): No  Physical Activity: Inactive (07/30/2023)   Received from [facility]   Exercise Vital Sign    Days of Exercise per Week: 0 days    Minutes of Exercise per Session: 0 min  Stress: Stress Concern Present (12/06/2022)   Received from [facility], [facility] - Occupational Stress Questionnaire    Feeling of Stress : To some extent  Social Connections: Socially Isolated (12/06/2022)   Received from [facility], [facility]   Social Network    How would you rate your social network (family, work, friends)?: Little participation, lonely and socially isolated  Intimate Partner Violence: Not At Risk (12/06/2022)   Received from [facility], [facility]   HITS    Over the last 12 months how often did your partner physically hurt you?: Never    Over the last 12 months how often did your partner insult you or talk down to  you?: Never    Over the last 12 months how often did your partner threaten you with physical harm?: Never    Over the last 12 months how often did your partner scream or curse at you?: Never    FAMILY HISTORY: Family History  Problem Relation Age of Onset   Hypertension Mother    Cancer Father        gastric cancer   Hypertension Father    Hyperlipidemia Father    Diabetes Brother    Diabetes Other    Colon cancer Neg Hx    Colon polyps Neg Hx    Esophageal cancer Neg Hx    Rectal cancer Neg Hx    Stomach cancer Neg Hx     ALLERGIES:  is allergic to hydrocodeine [dihydrocodeine], hydrocodone-acetaminophen, coreg [carvedilol], and labetalol.  MEDICATIONS:  Current Outpatient Medications  Medication Sig Dispense Refill   albuterol (VENTOLIN HFA) 108 (90 Base) MCG/ACT inhaler Inhale 2 puffs into the lungs every 4 (four) hours as needed for wheezing or shortness of breath. (Patient not taking: Reported on 06/22/2023) 1 each 0   ALPRAZolam (XANAX) 1 MG  tablet Take 0.5-1 tablets by mouth 3 (three) times daily as needed for anxiety.  1   amLODipine (NORVASC) 10 MG tablet Take 10 mg by mouth daily.     atorvastatin (LIPITOR) 80 MG tablet Take 80 mg by mouth daily.     clopidogrel (PLAVIX) 75 MG tablet Take 1 tablet (75 mg total) by mouth daily. 30 tablet 1   dicyclomine (BENTYL) 20 MG tablet Take 1 tablet (20 mg total) by mouth every 6 (six) hours. (Patient not taking: Reported on 06/06/2022) 10 tablet 0   ezetimibe (ZETIA) 10 MG tablet Take 10 mg by mouth daily.     famotidine (PEPCID) 40 MG tablet Take 1 tablet (40 mg total) by mouth at bedtime. 30 tablet 11   gabapentin (NEURONTIN) 300 MG capsule Take 300 mg by mouth daily as needed (nerve pain).     levothyroxine (SYNTHROID) 75 MCG tablet Take 75 mcg by mouth daily before breakfast.     losartan-hydrochlorothiazide (HYZAAR) 100-12.5 MG tablet TAKE 1 TABLET BY MOUTH EVERY DAY 30 tablet 0   ondansetron (ZOFRAN-ODT) 8 MG disintegrating tablet Take 8 mg by mouth every 8 (eight) hours as needed for nausea/vomiting.     pantoprazole (PROTONIX) 40 MG tablet TAKE 1 TABLET BY MOUTH TWICE DAILY BEFORE BREAKFAST AND DINNER (Patient taking differently: Take 80 mg by mouth daily.) 180 tablet 0   propranolol ER (INDERAL LA) 60 MG 24 hr capsule Take 60 mg by mouth daily.     sertraline (ZOLOFT) 100 MG tablet Take 100 mg by mouth daily.     No current facility-administered medications for this visit.    REVIEW OF SYSTEMS:   Constitutional: Denies fevers, chills or abnormal night sweats Eyes: Denies blurriness of vision, double vision or watery eyes Ears, nose, mouth, throat, and face: Denies mucositis or sore throat Respiratory: Denies cough, dyspnea or wheezes Cardiovascular: Denies palpitation, chest discomfort or lower extremity swelling Gastrointestinal:  Denies nausea, heartburn or change in bowel habits Skin: Denies abnormal skin rashes Lymphatics: Denies new lymphadenopathy or easy  bruising Neurological:Denies numbness, tingling or new weaknesses Behavioral/Psych: Mood is stable, no new changes  All other systems were reviewed with the patient and are negative.  PHYSICAL EXAMINATION: ECOG PERFORMANCE STATUS: 1 - Symptomatic but completely ambulatory  Vitals:   09/04/23 1551  BP: (!) 156/98  Pulse: 79  Resp:  20  Temp: 98 F (36.7 C)  SpO2: 99%   Filed Weights   09/04/23 1551  Weight: 200 lb 3.2 oz (90.8 kg)    GENERAL:alert, no distress and comfortable SKIN: skin color, texture, turgor are normal, no rashes or significant lesions EYES: normal, conjunctiva are pink and non-injected, sclera clear OROPHARYNX:no exudate, no erythema and lips, buccal mucosa, and tongue normal  NECK: supple, thyroid normal size, non-tender, without nodularity LYMPH:  no palpable lymphadenopathy in the cervical, axillary or inguinal LUNGS: clear to auscultation and percussion with normal breathing effort HEART: regular rate & rhythm and no murmurs and no lower extremity edema ABDOMEN:abdomen soft, non-tender and normal bowel sounds Musculoskeletal:no cyanosis of digits and no clubbing  PSYCH: alert & oriented x 3 with fluent speech NEURO: no focal motor/sensory deficits  Physical Exam          LABORATORY DATA:  I have reviewed the data as listed    Latest Ref Rng & Units 09/04/2023    3:28 PM 07/31/2023    2:48 PM 10/04/2022   12:51 AM  CBC  WBC 4.0 - 10.5 K/uL 5.5  6.4  12.6   Hemoglobin 12.0 - 15.0 g/dL 9.3  9.3  9.7   Hematocrit 36.0 - 46.0 % 31.5  30.3  32.8   Platelets 150 - 400 K/uL 279  328.0  363       Latest Ref Rng & Units 10/04/2022   12:51 AM 10/12/2021    4:24 AM 10/10/2021    2:38 PM  CMP  Glucose 70 - 99 mg/dL 981  98  191   BUN 6 - 20 mg/dL 12  9  19    Creatinine 0.44 - 1.00 mg/dL 4.78  2.95  6.21   Sodium 135 - 145 mmol/L 139  137  139   Potassium 3.5 - 5.1 mmol/L 3.7  3.4  3.5   Chloride 98 - 111 mmol/L 104  106  104   CO2 22 - 32 mmol/L 25   23    Calcium 8.9 - 10.3 mg/dL 9.1  8.9    Total Protein 6.5 - 8.1 g/dL 7.7     Total Bilirubin 0.3 - 1.2 mg/dL 0.6     Alkaline Phos 38 - 126 U/L 84     AST 15 - 41 U/L 17     ALT 0 - 44 U/L 26        RADIOGRAPHIC STUDIES: I have personally reviewed the radiological images as listed and agreed with the findings in the report. No results found.  ASSESSMENT & PLAN:  43 year old female with menorrhagia, was referred for worsening iron deficient anemia.    Iron Deficiency Anemia, secondary to menorrhagia Chronic iron deficiency anemia likely secondary to heavy menstrual bleeding. Hemoglobin levels have been in the 9 range since April 2023, indicating worsening anemia despite daily oral iron supplementation for over a year. Symptoms include fatigue and pica. No evidence of gastrointestinal bleeding from recent colonoscopy and endoscopy. Lab results show low iron levels, low ferritin, and high total iron binding capacity, consistent with iron deficiency anemia.  -IV iron is recommended due to insufficient response to oral iron. Most patients tolerate IV iron well, but there is a risk of allergic reactions, ranging from mild skin reactions to severe anaphylaxis requiring emergency care. The choice of IV iron product will depend on insurance coverage and availability. - Schedule IV iron infusions at a facility on [address] due to availability constraints at the current location. -  Monitor for allergic reactions during IV iron infusions, especially during the first few sessions. - Reassess hemoglobin and iron levels in one month post-infusion. - If anemia resolves post-IV iron, no further labs needed. If not, consider additional causes of anemia.  Menorrhagia Severe menorrhagia with worsening symptoms over the past year, contributing to iron deficiency anemia. She reports using both tampons and pads with breakthrough bleeding and passing large clots. Scheduled for a hysterectomy  consultation, which may resolve the anemia if performed. - Proceed with hysterectomy consultation. - Monitor menstrual bleeding and anemia status post-hysterectomy.  Hypertension Hypertension managed with Losartan, Hydrochlorothiazide, Amlodipine, and Propranolol. Contributing factor to previous strokes.  History of Stroke Two strokes attributed to hypertension. Currently on Plavix for stroke prevention.  Hypothyroidism Hypothyroidism managed with Synthroid.  Plan -I recommend IV iron, for total 1 g in the next month -Repeated labs monthly -Follow-up in 3 months      No orders of the defined types were placed in this encounter.   All questions were answered. The patient knows to call the clinic with any problems, questions or concerns. I spent 30 minutes counseling the patient face to face. The total time spent in the appointment was 35 minutes and more than 50% was on counseling.     Malachy Mood, MD 09/04/2023 10:16 PM

## 2023-09-05 ENCOUNTER — Other Ambulatory Visit: Payer: Self-pay

## 2023-09-05 ENCOUNTER — Telehealth: Payer: Self-pay

## 2023-09-05 ENCOUNTER — Telehealth: Payer: Self-pay | Admitting: Hematology

## 2023-09-05 LAB — FERRITIN: Ferritin: 6 ng/mL — ABNORMAL LOW (ref 11–307)

## 2023-09-05 NOTE — Progress Notes (Signed)
 Verbal order w/readback from Dr. Mosetta Putt for [facility] 510mg  x2 doses w/premeds Acetaminophen 650mg  PO and Diphenhydramine 25mg  PO to be administered prior to Feraheme.  Place order at [facility].  Orders placed and awaiting insurance approval.

## 2023-09-05 NOTE — Telephone Encounter (Signed)
 Per scheduling orders on 09/05/2023 patient is aware of scheduled appointment times/dates for both labs and follow up appointment, patient also has callback number if needing to reschedule or cancel any appointments

## 2023-09-05 NOTE — Telephone Encounter (Signed)
 Dr. Mosetta Putt and Olegario Shearer, patient will be scheduled as soon as possible.  Auth Submission: NO AUTH NEEDED Site of care: Site of care: [facility] Payer: BCBS commercial Medication & CPT/J Code(s) submitted: Feraheme (ferumoxytol) [id number] Route of submission (phone, fax, portal):  Phone # Fax # Auth type: Buy/Bill PB Units/visits requested: 510mg  x 2 doses Reference number:  Approval from: 09/05/23 to 03/07/24

## 2023-09-11 ENCOUNTER — Ambulatory Visit: Payer: BC Managed Care – PPO | Admitting: Gastroenterology

## 2023-09-13 ENCOUNTER — Ambulatory Visit

## 2023-09-13 VITALS — BP 150/99 | HR 72 | Temp 98.3°F | Resp 16 | Ht 67.0 in | Wt 197.6 lb

## 2023-09-13 DIAGNOSIS — D500 Iron deficiency anemia secondary to blood loss (chronic): Secondary | ICD-10-CM | POA: Diagnosis not present

## 2023-09-13 DIAGNOSIS — N920 Excessive and frequent menstruation with regular cycle: Secondary | ICD-10-CM

## 2023-09-13 MED ORDER — ACETAMINOPHEN 325 MG PO TABS
650.0000 mg | ORAL_TABLET | Freq: Once | ORAL | Status: AC
  Administered 2023-09-13: 650 mg via ORAL
  Filled 2023-09-13: qty 2

## 2023-09-13 MED ORDER — FERUMOXYTOL INJECTION 510 MG/17 ML
510.0000 mg | Freq: Once | INTRAVENOUS | Status: AC
  Administered 2023-09-13: 510 mg via INTRAVENOUS
  Filled 2023-09-13: qty 17

## 2023-09-13 MED ORDER — DIPHENHYDRAMINE HCL 25 MG PO CAPS
25.0000 mg | ORAL_CAPSULE | Freq: Once | ORAL | Status: AC
  Administered 2023-09-13: 25 mg via ORAL
  Filled 2023-09-13: qty 1

## 2023-09-13 NOTE — Patient Instructions (Signed)

## 2023-09-13 NOTE — Progress Notes (Signed)
 Diagnosis: Iron Deficiency Anemia  Provider:  Chilton Greathouse MD  Procedure: IV Infusion  IV Type: Peripheral, IV Location: R Forearm  Feraheme (Ferumoxytol), Dose: 510 mg  Infusion Start Time: 1436  Infusion Stop Time: 1452  Post Infusion IV Care: Observation period completed and Peripheral IV Discontinued  Discharge: Condition: Good, Destination: Home . AVS Declined  Performed by:  Adriana Mccallum, RN

## 2023-09-20 ENCOUNTER — Ambulatory Visit

## 2023-09-20 VITALS — BP 177/130 | HR 71 | Temp 97.8°F | Resp 16 | Ht 67.5 in | Wt 199.6 lb

## 2023-09-20 DIAGNOSIS — N920 Excessive and frequent menstruation with regular cycle: Secondary | ICD-10-CM | POA: Diagnosis not present

## 2023-09-20 DIAGNOSIS — D500 Iron deficiency anemia secondary to blood loss (chronic): Secondary | ICD-10-CM | POA: Diagnosis not present

## 2023-09-20 MED ORDER — SODIUM CHLORIDE 0.9 % IV SOLN
510.0000 mg | Freq: Once | INTRAVENOUS | Status: AC
  Administered 2023-09-20: 510 mg via INTRAVENOUS
  Filled 2023-09-20: qty 17

## 2023-09-20 MED ORDER — ACETAMINOPHEN 325 MG PO TABS
650.0000 mg | ORAL_TABLET | Freq: Once | ORAL | Status: AC
  Administered 2023-09-20: 650 mg via ORAL
  Filled 2023-09-20: qty 2

## 2023-09-20 MED ORDER — DIPHENHYDRAMINE HCL 25 MG PO CAPS
25.0000 mg | ORAL_CAPSULE | Freq: Once | ORAL | Status: AC
  Administered 2023-09-20: 25 mg via ORAL
  Filled 2023-09-20: qty 1

## 2023-09-20 NOTE — Progress Notes (Signed)
 Diagnosis: Iron Deficiency Anemia  Provider:  Chilton Greathouse MD  Procedure: IV Infusion  IV Type: Peripheral, IV Location: R Forearm  Feraheme (Ferumoxytol), Dose: 510 mg  Infusion Start Time: 1442  Infusion Stop Time: 1505  Post Infusion IV Care: Observation period completed and Peripheral IV Discontinued  Discharge: Condition: Good, Destination: Home . AVS Declined  Performed by:  Adriana Mccallum, RN

## 2023-09-28 ENCOUNTER — Telehealth: Admitting: Physician Assistant

## 2023-09-28 DIAGNOSIS — M546 Pain in thoracic spine: Secondary | ICD-10-CM

## 2023-09-28 MED ORDER — PREDNISONE 10 MG (21) PO TBPK: ORAL_TABLET | ORAL | 0 refills | Status: AC

## 2023-09-28 MED ORDER — METHOCARBAMOL 750 MG PO TABS: 750.0000 mg | ORAL_TABLET | Freq: Four times a day (QID) | ORAL | 0 refills | Status: AC

## 2023-09-28 MED ORDER — LIDOCAINE 4 % EX PTCH: 1.0000 | MEDICATED_PATCH | CUTANEOUS | 0 refills | Status: AC

## 2023-09-28 NOTE — Patient Instructions (Signed)
 Babette Relic, thank you for joining Margaretann Loveless, PA-C for today's virtual visit.  While this provider is not your primary care provider (PCP), if your PCP is located in our provider database this encounter information will be shared with them immediately following your visit.   A [facility] MyChart account gives you access to today's visit and all your visits, tests, and labs performed at [facility] " click here if you don't have a [facility] MyChart account or go to [email or web address]  Consent: (Patient) Maria Lang provided verbal consent for this virtual visit at the beginning of the encounter.  Current Medications:  Current Outpatient Medications:    lidocaine 4 %, Place 1 patch onto the skin daily., Disp: 10 patch, Rfl: 0   methocarbamol (ROBAXIN) 750 MG tablet, Take 1 tablet (750 mg total) by mouth 4 (four) times daily., Disp: 20 tablet, Rfl: 0   predniSONE (STERAPRED UNI-PAK 21 TAB) 10 MG (21) TBPK tablet, 6 day taper; take as directed on package instructions, Disp: 21 tablet, Rfl: 0   albuterol (VENTOLIN HFA) 108 (90 Base) MCG/ACT inhaler, Inhale 2 puffs into the lungs every 4 (four) hours as needed for wheezing or shortness of breath. (Patient not taking: Reported on 06/22/2023), Disp: 1 each, Rfl: 0   ALPRAZolam (XANAX) 1 MG tablet, Take 0.5-1 tablets by mouth 3 (three) times daily as needed for anxiety., Disp: , Rfl: 1   amLODipine (NORVASC) 10 MG tablet, Take 10 mg by mouth daily., Disp: , Rfl:    atorvastatin (LIPITOR) 80 MG tablet, Take 80 mg by mouth daily., Disp: , Rfl:    clopidogrel (PLAVIX) 75 MG tablet, Take 1 tablet (75 mg total) by mouth daily., Disp: 30 tablet, Rfl: 1   dicyclomine (BENTYL) 20 MG tablet, Take 1 tablet (20 mg total) by mouth every 6 (six) hours. (Patient not taking: Reported on 06/06/2022), Disp: 10 tablet, Rfl: 0   ezetimibe (ZETIA) 10 MG tablet, Take 10 mg by mouth daily., Disp: , Rfl:    famotidine (PEPCID) 40 MG  tablet, Take 1 tablet (40 mg total) by mouth at bedtime., Disp: 30 tablet, Rfl: 11   gabapentin (NEURONTIN) 300 MG capsule, Take 300 mg by mouth daily as needed (nerve pain)., Disp: , Rfl:    levothyroxine (SYNTHROID) 75 MCG tablet, Take 75 mcg by mouth daily before breakfast., Disp: , Rfl:    losartan-hydrochlorothiazide (HYZAAR) 100-12.5 MG tablet, TAKE 1 TABLET BY MOUTH EVERY DAY, Disp: 30 tablet, Rfl: 0   ondansetron (ZOFRAN-ODT) 8 MG disintegrating tablet, Take 8 mg by mouth every 8 (eight) hours as needed for nausea/vomiting., Disp: , Rfl:    pantoprazole (PROTONIX) 40 MG tablet, TAKE 1 TABLET BY MOUTH TWICE DAILY BEFORE BREAKFAST AND DINNER (Patient taking differently: Take 80 mg by mouth daily.), Disp: 180 tablet, Rfl: 0   propranolol ER (INDERAL LA) 60 MG 24 hr capsule, Take 60 mg by mouth daily., Disp: , Rfl:    sertraline (ZOLOFT) 100 MG tablet, Take 100 mg by mouth daily., Disp: , Rfl:    Medications ordered in this encounter:  Meds ordered this encounter  Medications   predniSONE (STERAPRED UNI-PAK 21 TAB) 10 MG (21) TBPK tablet    Sig: 6 day taper; take as directed on package instructions    Dispense:  21 tablet    Refill:  0    Supervising Provider:   Merrilee Jansky [[id number]]   methocarbamol (ROBAXIN) 750 MG tablet    Sig: Take 1 tablet (750 mg  total) by mouth 4 (four) times daily.    Dispense:  20 tablet    Refill:  0    Supervising Provider:   Merrilee Jansky [[id number]]   lidocaine 4 %    Sig: Place 1 patch onto the skin daily.    Dispense:  10 patch    Refill:  0    Supervising Provider:   Merrilee Jansky [id number]     *If you need refills on other medications prior to your next appointment, please contact your pharmacy*  Follow-Up: Call back or seek an in-person evaluation if the symptoms worsen or if the condition fails to improve as anticipated.  [facility] [phone]  Other Instructions Back Exercises The following exercises  strengthen the muscles that help to support the trunk (torso) and back. They also help to keep the lower back flexible. Doing these exercises can help to prevent or lessen existing low back pain. If you have back pain or discomfort, try doing these exercises 2-3 times each day or as told by your health care provider. As your pain improves, do them once each day, but increase the number of times that you repeat the steps for each exercise (do more repetitions). To prevent the recurrence of back pain, continue to do these exercises once each day or as told by your health care provider. Do exercises exactly as told by your health care provider and adjust them as directed. It is normal to feel mild stretching, pulling, tightness, or discomfort as you do these exercises, but you should stop right away if you feel sudden pain or your pain gets worse. Exercises Single knee to chest Repeat these steps 3-5 times for each leg: Lie on your back on a firm bed or the floor with your legs extended. Bring one knee to your chest. Your other leg should stay extended and in contact with the floor. Hold your knee in place by grabbing your knee or thigh with both hands and hold. Pull on your knee until you feel a gentle stretch in your lower back or buttocks. Hold the stretch for 10-30 seconds. Slowly release and straighten your leg.  Pelvic tilt Repeat these steps 5-10 times: Lie on your back on a firm bed or the floor with your legs extended. Bend your knees so they are pointing toward the ceiling and your feet are flat on the floor. Tighten your lower abdominal muscles to press your lower back against the floor. This motion will tilt your pelvis so your tailbone points up toward the ceiling instead of pointing to your feet or the floor. With gentle tension and even breathing, hold this position for 5-10 seconds.  Cat-cow Repeat these steps until your lower back becomes more flexible: Get into a  hands-and-knees position on a firm bed or the floor. Keep your hands under your shoulders, and keep your knees under your hips. You may place padding under your knees for comfort. Let your head hang down toward your chest. Contract your abdominal muscles and point your tailbone toward the floor so your lower back becomes rounded like the back of a cat. Hold this position for 5 seconds. Slowly lift your head, let your abdominal muscles relax, and point your tailbone up toward the ceiling so your back forms a sagging arch like the back of a cow. Hold this position for 5 seconds.  Press-ups Repeat these steps 5-10 times: Lie on your abdomen (face-down) on a firm bed or the floor.  Place your palms near your head, about shoulder-width apart. Keeping your back as relaxed as possible and keeping your hips on the floor, slowly straighten your arms to raise the top half of your body and lift your shoulders. Do not use your back muscles to raise your upper torso. You may adjust the placement of your hands to make yourself more comfortable. Hold this position for 5 seconds while you keep your back relaxed. Slowly return to lying flat on the floor.  Bridges Repeat these steps 10 times: Lie on your back on a firm bed or the floor. Bend your knees so they are pointing toward the ceiling and your feet are flat on the floor. Your arms should be flat at your sides, next to your body. Tighten your buttocks muscles and lift your buttocks off the floor until your waist is at almost the same height as your knees. You should feel the muscles working in your buttocks and the back of your thighs. If you do not feel these muscles, slide your feet 1-2 inches (2.5-5 cm) farther away from your buttocks. Hold this position for 3-5 seconds. Slowly lower your hips to the starting position, and allow your buttocks muscles to relax completely. If this exercise is too easy, try doing it with your arms crossed over your  chest. Abdominal crunches Repeat these steps 5-10 times: Lie on your back on a firm bed or the floor with your legs extended. Bend your knees so they are pointing toward the ceiling and your feet are flat on the floor. Cross your arms over your chest. Tip your chin slightly toward your chest without bending your neck. Tighten your abdominal muscles and slowly raise your torso high enough to lift your shoulder blades a tiny bit off the floor. Avoid raising your torso higher than that because it can put too much stress on your lower back and does not help to strengthen your abdominal muscles. Slowly return to your starting position.  Back lifts Repeat these steps 5-10 times: Lie on your abdomen (face-down) with your arms at your sides, and rest your forehead on the floor. Tighten the muscles in your legs and your buttocks. Slowly lift your chest off the floor while you keep your hips pressed to the floor. Keep the back of your head in line with the curve in your back. Your eyes should be looking at the floor. Hold this position for 3-5 seconds. Slowly return to your starting position.  Contact a health care provider if: Your back pain or discomfort gets much worse when you do an exercise. Your worsening back pain or discomfort does not lessen within 2 hours after you exercise. If you have any of these problems, stop doing these exercises right away. Do not do them again unless your health care provider says that you can. Get help right away if: You develop sudden, severe back pain. If this happens, stop doing the exercises right away. Do not do them again unless your health care provider says that you can. This information is not intended to replace advice given to you by your health care provider. Make sure you discuss any questions you have with your health care provider. Document Revised: 07/16/2022 Document Reviewed: 08/25/2020 Elsevier Patient Education  2024 [employer].   If you  have been instructed to have an in-person evaluation today at a local Urgent Care facility, please use the link below. It will take you to a list of all of our available [facility], including address, phone number and hours of operation. Please do not delay care.  [facility]  If you or a family member do not have a primary care provider, use the link below to schedule a visit and establish care. When you choose a [facility] primary care physician or advanced practice provider, you gain a long-term partner in health. Find a Primary Care Provider  Learn more about [facility]'s in-office and virtual care options: [facility] - Get Care Now

## 2023-09-28 NOTE — Progress Notes (Signed)
 Virtual Visit Consent   Maria Lang, you are scheduled for a virtual visit with a [facility] provider today. Just as with appointments in the office, your consent must be obtained to participate. Your consent will be active for this visit and any virtual visit you may have with one of our providers in the next 365 days. If you have a MyChart account, a copy of this consent can be sent to you electronically.  As this is a virtual visit, video technology does not allow for your provider to perform a traditional examination. This may limit your provider's ability to fully assess your condition. If your provider identifies any concerns that need to be evaluated in person or the need to arrange testing (such as labs, EKG, etc.), we will make arrangements to do so. Although advances in technology are sophisticated, we cannot ensure that it will always work on either your end or our end. If the connection with a video visit is poor, the visit may have to be switched to a telephone visit. With either a video or telephone visit, we are not always able to ensure that we have a secure connection.  By engaging in this virtual visit, you consent to the provision of healthcare and authorize for your insurance to be billed (if applicable) for the services provided during this visit. Depending on your insurance coverage, you may receive a charge related to this service.  I need to obtain your verbal consent now. Are you willing to proceed with your visit today? Maria Lang has provided verbal consent on 09/28/2023 for a virtual visit (video or telephone). Margaretann Loveless, PA-C  Date: 09/28/2023 6:52 PM   Virtual Visit via Video Note   I, Margaretann Loveless, connected with  Maria Lang  ([id number], 18-Apr-1981) on 09/28/23 at  6:30 PM EDT by a video-enabled telemedicine application and verified that I am speaking with the correct person using two identifiers.  Location: Patient: Virtual Visit Location  Patient: Home Provider: Virtual Visit Location Provider: Home Office   I discussed the limitations of evaluation and management by telemedicine and the availability of in person appointments. The patient expressed understanding and agreed to proceed.    History of Present Illness: Maria Lang is a 43 y.o. who identifies as a female who was assigned female at birth, and is being seen today for upper back pain.  HPI: Back Pain This is a recurrent problem. The current episode started in the past 7 days (2 days). The problem occurs constantly. The problem has been gradually worsening since onset. The pain is present in the thoracic spine (right shoulder blade). The quality of the pain is described as stabbing and cramping. The pain does not radiate. The pain is moderate. The pain is The same all the time. The symptoms are aggravated by lying down and twisting. Stiffness is present All day. Pertinent negatives include no bladder incontinence, bowel incontinence, fever, headaches, numbness, tingling or weakness. Risk factors include lack of exercise, sedentary lifestyle, obesity and poor posture. She has tried muscle relaxant and heat for the symptoms. The treatment provided no relief.     Problems:  Patient Active Problem List   Diagnosis Date Noted   Iron deficiency anemia due to chronic blood loss 09/04/2023   H. pylori infection 06/29/2023   Neuroendocrine tumor 05/07/2023   Benign neoplasm of rectum 05/07/2023   History of colonic polyps 05/07/2023   Acute stroke due to ischemia [facility]) 10/10/2021   Acute ischemic stroke (HCC)  10/10/2021   Rectal bleeding 09/07/2021   Nausea without vomiting 09/07/2021   Acute CVA (cerebrovascular accident) (HCC) 12/21/2018   Sleep apnea    Anxiety    Benzodiazepine dependence (HCC)    Acute Left-sided weakness    Acute Speech abnormality    Smoker    Hyperglycemia    Hypothyroidism    Chest pain 12/10/2014   GERD (gastroesophageal reflux disease)  12/10/2014   Chronic diastolic CHF (congestive heart failure) (HCC) 12/10/2014   Hypertension    Pain in the chest    Hypertensive emergency     Allergies:  Allergies  Allergen Reactions   Hydrocodeine [Dihydrocodeine] Shortness Of Breath and Nausea And Vomiting   Hydrocodone-Acetaminophen Shortness Of Breath and Nausea Only   Coreg [Carvedilol]     Headache    Labetalol Other (See Comments)    Head itches and GI intolerance   Medications:  Current Outpatient Medications:    lidocaine 4 %, Place 1 patch onto the skin daily., Disp: 10 patch, Rfl: 0   methocarbamol (ROBAXIN) 750 MG tablet, Take 1 tablet (750 mg total) by mouth 4 (four) times daily., Disp: 20 tablet, Rfl: 0   predniSONE (STERAPRED UNI-PAK 21 TAB) 10 MG (21) TBPK tablet, 6 day taper; take as directed on package instructions, Disp: 21 tablet, Rfl: 0   albuterol (VENTOLIN HFA) 108 (90 Base) MCG/ACT inhaler, Inhale 2 puffs into the lungs every 4 (four) hours as needed for wheezing or shortness of breath. (Patient not taking: Reported on 06/22/2023), Disp: 1 each, Rfl: 0   ALPRAZolam (XANAX) 1 MG tablet, Take 0.5-1 tablets by mouth 3 (three) times daily as needed for anxiety., Disp: , Rfl: 1   amLODipine (NORVASC) 10 MG tablet, Take 10 mg by mouth daily., Disp: , Rfl:    atorvastatin (LIPITOR) 80 MG tablet, Take 80 mg by mouth daily., Disp: , Rfl:    clopidogrel (PLAVIX) 75 MG tablet, Take 1 tablet (75 mg total) by mouth daily., Disp: 30 tablet, Rfl: 1   dicyclomine (BENTYL) 20 MG tablet, Take 1 tablet (20 mg total) by mouth every 6 (six) hours. (Patient not taking: Reported on 06/06/2022), Disp: 10 tablet, Rfl: 0   ezetimibe (ZETIA) 10 MG tablet, Take 10 mg by mouth daily., Disp: , Rfl:    famotidine (PEPCID) 40 MG tablet, Take 1 tablet (40 mg total) by mouth at bedtime., Disp: 30 tablet, Rfl: 11   gabapentin (NEURONTIN) 300 MG capsule, Take 300 mg by mouth daily as needed (nerve pain)., Disp: , Rfl:    levothyroxine  (SYNTHROID) 75 MCG tablet, Take 75 mcg by mouth daily before breakfast., Disp: , Rfl:    losartan-hydrochlorothiazide (HYZAAR) 100-12.5 MG tablet, TAKE 1 TABLET BY MOUTH EVERY DAY, Disp: 30 tablet, Rfl: 0   ondansetron (ZOFRAN-ODT) 8 MG disintegrating tablet, Take 8 mg by mouth every 8 (eight) hours as needed for nausea/vomiting., Disp: , Rfl:    pantoprazole (PROTONIX) 40 MG tablet, TAKE 1 TABLET BY MOUTH TWICE DAILY BEFORE BREAKFAST AND DINNER (Patient taking differently: Take 80 mg by mouth daily.), Disp: 180 tablet, Rfl: 0   propranolol ER (INDERAL LA) 60 MG 24 hr capsule, Take 60 mg by mouth daily., Disp: , Rfl:    sertraline (ZOLOFT) 100 MG tablet, Take 100 mg by mouth daily., Disp: , Rfl:   Observations/Objective: Patient is well-developed, well-nourished in no acute distress.  Resting comfortably at home.  Head is normocephalic, atraumatic.  No labored breathing.  Speech is clear and coherent with logical  content.  Patient is alert and oriented at baseline.    Assessment and Plan: 1. Acute right-sided thoracic back pain (Primary) - predniSONE (STERAPRED UNI-PAK 21 TAB) 10 MG (21) TBPK tablet; 6 day taper; take as directed on package instructions  Dispense: 21 tablet; Refill: 0 - methocarbamol (ROBAXIN) 750 MG tablet; Take 1 tablet (750 mg total) by mouth 4 (four) times daily.  Dispense: 20 tablet; Refill: 0 - lidocaine 4 %; Place 1 patch onto the skin daily.  Dispense: 10 patch; Refill: 0  - Suspect upper back pain, possible trigger points - Failed NSAIDs - Add Prednisone dose pack and flexeril - Avoid NSAIDs (Ibuprofen/Advil/Motrin or Naproxen/Aleve) while on steroid - Tylenol if okay for breakthrough pain - Heat to area - Epsom salt soak if able to get in and out of bath tub safely - Back exercises and stretches provided via AVS - Seek in person evaluation if worsening or fails to improve with treatment   Follow Up Instructions: I discussed the assessment and treatment  plan with the patient. The patient was provided an opportunity to ask questions and all were answered. The patient agreed with the plan and demonstrated an understanding of the instructions.  A copy of instructions were sent to the patient via MyChart unless otherwise noted below.    The patient was advised to call back or seek an in-person evaluation if the symptoms worsen or if the condition fails to improve as anticipated.    Margaretann Loveless, PA-C

## 2023-10-02 ENCOUNTER — Other Ambulatory Visit: Payer: Self-pay

## 2023-10-02 DIAGNOSIS — D500 Iron deficiency anemia secondary to blood loss (chronic): Secondary | ICD-10-CM

## 2023-10-03 ENCOUNTER — Inpatient Hospital Stay: Attending: Hematology

## 2023-10-03 DIAGNOSIS — N920 Excessive and frequent menstruation with regular cycle: Secondary | ICD-10-CM | POA: Insufficient documentation

## 2023-10-03 DIAGNOSIS — D500 Iron deficiency anemia secondary to blood loss (chronic): Secondary | ICD-10-CM | POA: Insufficient documentation

## 2023-10-03 LAB — FERRITIN: Ferritin: 305 ng/mL (ref 11–307)

## 2023-10-03 LAB — CBC WITH DIFFERENTIAL (CANCER CENTER ONLY)
Abs Immature Granulocytes: 0.04 10*3/uL (ref 0.00–0.07)
Basophils Absolute: 0.1 10*3/uL (ref 0.0–0.1)
Basophils Relative: 1 %
Eosinophils Absolute: 0 10*3/uL (ref 0.0–0.5)
Eosinophils Relative: 0 %
HCT: 39.5 % (ref 36.0–46.0)
Hemoglobin: 12.6 g/dL (ref 12.0–15.0)
Immature Granulocytes: 0 %
Lymphocytes Relative: 50 %
Lymphs Abs: 4.7 10*3/uL — ABNORMAL HIGH (ref 0.7–4.0)
MCH: 26.9 pg (ref 26.0–34.0)
MCHC: 31.9 g/dL (ref 30.0–36.0)
MCV: 84.2 fL (ref 80.0–100.0)
Monocytes Absolute: 0.6 10*3/uL (ref 0.1–1.0)
Monocytes Relative: 6 %
Neutro Abs: 4 10*3/uL (ref 1.7–7.7)
Neutrophils Relative %: 43 %
Platelet Count: 337 10*3/uL (ref 150–400)
RBC: 4.69 MIL/uL (ref 3.87–5.11)
RDW: 24 % — ABNORMAL HIGH (ref 11.5–15.5)
WBC Count: 9.5 10*3/uL (ref 4.0–10.5)
nRBC: 0 % (ref 0.0–0.2)

## 2023-10-03 LAB — IRON AND IRON BINDING CAPACITY (CC-WL,HP ONLY)
Iron: 78 ug/dL (ref 28–170)
Saturation Ratios: 20 % (ref 10.4–31.8)
TIBC: 400 ug/dL (ref 250–450)
UIBC: 322 ug/dL (ref 148–442)

## 2023-10-08 ENCOUNTER — Encounter: Payer: Self-pay | Admitting: Hematology

## 2023-10-31 ENCOUNTER — Other Ambulatory Visit: Payer: Self-pay

## 2023-10-31 ENCOUNTER — Ambulatory Visit: Payer: BC Managed Care – PPO | Admitting: Gastroenterology

## 2023-10-31 ENCOUNTER — Inpatient Hospital Stay: Attending: Hematology

## 2023-10-31 DIAGNOSIS — D500 Iron deficiency anemia secondary to blood loss (chronic): Secondary | ICD-10-CM

## 2023-10-31 DIAGNOSIS — N920 Excessive and frequent menstruation with regular cycle: Secondary | ICD-10-CM | POA: Insufficient documentation

## 2023-10-31 LAB — IRON AND IRON BINDING CAPACITY (CC-WL,HP ONLY)
Iron: 104 ug/dL (ref 28–170)
Saturation Ratios: 29 % (ref 10.4–31.8)
TIBC: 356 ug/dL (ref 250–450)
UIBC: 252 ug/dL (ref 148–442)

## 2023-10-31 LAB — CBC WITH DIFFERENTIAL (CANCER CENTER ONLY)
Abs Immature Granulocytes: 0.01 10*3/uL (ref 0.00–0.07)
Basophils Absolute: 0 10*3/uL (ref 0.0–0.1)
Basophils Relative: 0 %
Eosinophils Absolute: 0 10*3/uL (ref 0.0–0.5)
Eosinophils Relative: 0 %
HCT: 35.8 % — ABNORMAL LOW (ref 36.0–46.0)
Hemoglobin: 12.1 g/dL (ref 12.0–15.0)
Immature Granulocytes: 0 %
Lymphocytes Relative: 30 %
Lymphs Abs: 1.9 10*3/uL (ref 0.7–4.0)
MCH: 28.9 pg (ref 26.0–34.0)
MCHC: 33.8 g/dL (ref 30.0–36.0)
MCV: 85.4 fL (ref 80.0–100.0)
Monocytes Absolute: 0.4 10*3/uL (ref 0.1–1.0)
Monocytes Relative: 7 %
Neutro Abs: 3.9 10*3/uL (ref 1.7–7.7)
Neutrophils Relative %: 63 %
Platelet Count: 259 10*3/uL (ref 150–400)
RBC: 4.19 MIL/uL (ref 3.87–5.11)
RDW: 20.6 % — ABNORMAL HIGH (ref 11.5–15.5)
WBC Count: 6.2 10*3/uL (ref 4.0–10.5)
nRBC: 0 % (ref 0.0–0.2)

## 2023-10-31 LAB — VITAMIN B12: Vitamin B-12: 216 pg/mL (ref 180–914)

## 2023-10-31 LAB — FERRITIN: Ferritin: 227 ng/mL (ref 11–307)

## 2023-11-15 NOTE — Progress Notes (Deleted)
 [facility] [address] [place].  [address] [phone]       STROKE FOLLOW UP NOTE  [name] Date of Birth:  1981-03-19 Medical Record Number:  [id number]   Reason for Referral: stroke follow up   SUBJECTIVE:   CHIEF COMPLAINT:  No chief complaint on file.   HPI:   Update 11/15/2023 JM: patient returns for follow up visit re: her memory complaints, previously seen by Dr. Janett Medin 7 months ago.          History provided for reference purposes only Update 04/23/2023 Dr. Janett Medin : She returns for follow-up after last visit 6 months ago.  She continues to do well.  She is still have some short-term memory and cognitive difficulties but she feels these are unchanged and seem most noticeable when she is under stress.  She also has underlying anxiety which also makes situation worse.  She denies any headaches or focal neurological symptoms.  She is still independent in activities of daily living.  She had carpal tunnel surgery in the left hand on 04/02/2023 and in the right hand in August this year.  She underwent lab work at last visit on 10/12/2022 and vitamin B12, TSH, RPR and homocystine were all normal.  Apoprotein E blood test was positive for a single copy of a 3/84 which puts her at slightly high risk for Alzheimer's late in life.  MRI scan of the brain on 10/17/2022 shows remote age right pontine infarct with changes of small vessel disease.  No acute abnormalities.  She has no new complaints today.  She remains on Plavix  which she is tolerating well without bruising or bleeding and she states her blood pressure and cholesterol both have been under good control and she is tolerating Lipitor  well without muscle aches and pains.  She states she had lab work checked by primary physician a few months ago and everything was satisfactory.  I do not have those results to review today.   Update 10/12/2022 Dr. Janett Medin:  [name] is a 43 year old Hispanic  lady seen today for initial office consultation visit for memory and speech difficulties.  History is obtained from the patient and review of electronic medical records and I personally reviewed pertinent available imaging films in PACS.  She has past medical history of hypertension, hyperlipidemia, sleep apnea, GERD, right pontine stroke without residual deficits and strokelike episode in April 2023 with left-sided weakness treated with TNK. Patient states that she does notice mild memory difficulties and speech difficulties which seem to have gotten worse for the last few months.  She has word finding difficulties and often gets stuck in sentences and has trouble getting words out.  She has some good days and bad days.  She does have a history of longstanding anxiety but she feels it is well-controlled on the current medication regimen of Zoloft  and Xanax  as needed.  She denies any increased stressors or increase in anxiety.  She is fully independent in activities of daily living except she is scared to use dose to during the day as she may forget to turn it off so she uses the microwave and only when her husband is at home she cooks on the stove.  She denies any significant headaches, gait or balance problems.  There is no history of significant head injury with loss of consciousness, seizures.  She does have a strong family history of dementia on both sides of the family.  Her dad died of Alzheimer's disease.  She worries about this diagnosis.  She has not had any recent lab work or brain imaging done.  Update 05/31/2022 JM: Patient returns for 56-month stroke follow-up.  Overall stable from stroke standpoint without new or reoccurring stroke/TIA symptoms.  Remains on Plavix  and atorvastatin  and Zetia .  Blood pressure well controlled at home, typically 130s/90s.  Routinely follows with PCP.  She continues to have left leg pain and more recently low back pain, is being seen by Ortho for SI joint issues (per  patient) and currently working with PT, per patient if no improvement with PT then plans on pursuing imaging for further evaluation, has f/u with Ortho on 1/9 (unable to view via epic).  No further concerns at this time.  Initial visit 11/24/2021 JM: Patient is being seen for initial hospital follow-up.  She was previously seen in office 03/05/2019 for initial hospital stroke follow-up but did not follow-up after that time. She does have occasional left leg pain which she reports started shortly after discharge, was started on gabapentin by PCP which she uses as needed, has been getting better. No new stroke/TIA symptoms.  Has returned back to all prior activities.  Completed 3 weeks DAPT, remains on Plavix  alone as well as atorvastatin , denies side effects.  Blood pressure today 146/102, monitors at home and has been running on higher side of normal more recently.  She believes this is more due to her anxiety regarding her current health.  Shortly before her TIA, she was found to have a rectal tumor and was scheduled for surgical removal on 5/25 but this was canceled in setting of recent TIA.  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  She is awaiting to undergo CT abdomen/pelvis to ensure no evidence of lymphadenopathy. She questions at what point can Plavix  be held to undergo removal.  She was able to complete about 1 to 2 weeks of cardiac monitor but had difficulty and monitor consistently said poor skin contact.  She has not yet sent back monitor. Remaining hypercoagulable labs pending at discharge were largely unremarkable.  No further concerns at this time.  Stroke admission 10/10/2021 Maria Lang is a 43 year old female with history of hypertension, hyperlipidemia, former smoker, OSA, hx of R pontine stroke in 11/2018 without residual deficit who presented on 10/10/2021 with left-sided weakness, and decreased level of consciousness.  Personally reviewed hospitalization pertinent  progress notes, lab work and imaging.  Evaluated by Dr. Christiane Cowing.  CT no acute abnormality.  Status post TNK.  CTA head and neck showed right P2 moderate stenosis.  MRI no acute infarct.  EF 60 to 65%, LDL 71, A1c 5.3.  DVT negative.  Hypercoag work-up pending at discharge.  Creatinine 1.20, ESR 30, UA WBC 21-50.  Evaluated by Dr. Christiane Cowing, exam neurologically intact without any deficit.  Symptoms possibly TIA vs aborted stroke s/p TNK although etiology not quite clear therefore recommended 30-day cardiac event monitor to rule out A-fib.  Recommended DAPT for 3 weeks and Plavix  alone and continue atorvastatin  40 mg daily. Discharged home without therapy needs.     PERTINENT IMAGING  MR BRAIN WO CONTRAST 10/11/2021 IMPRESSION: 1. No acute intracranial abnormality. 2. Chronic pontine infarct.  CTA HEAD/NECK 10/10/2021 IMPRESSION: 1. No emergent large vessel occlusion. 2. Moderate focal stenosis of the right P2 segment, new since 2020. Otherwise, patent vasculature of the head and neck.  CT HEAD 10/10/2021 IMPRESSION: 1. No acute intracranial pathology. 2. ASPECTS is 10  2D ECHO 10/11/2021 IMPRESSIONS   1. Left  ventricular ejection fraction, by estimation, is 60 to 65%. The  left ventricle has normal function. The left ventricle has no regional  wall motion abnormalities. There is moderate concentric left ventricular  hypertrophy. Left ventricular  diastolic parameters are consistent with Grade I diastolic dysfunction  (impaired relaxation).   2. Right ventricular systolic function is normal. The right ventricular  size is normal. Tricuspid regurgitation signal is inadequate for assessing  PA pressure.   3. The mitral valve is grossly normal. No evidence of mitral valve  regurgitation. No evidence of mitral stenosis.   4. The aortic valve is tricuspid. Aortic valve regurgitation is not  visualized. No aortic stenosis is present.   5. The inferior vena cava is normal in size with greater than  50%  respiratory variability, suggesting right atrial pressure of 3 mmHg.        ROS:   14 system review of systems performed and negative with exception of those listed in HPI  PMH:  Past Medical History:  Diagnosis Date   Anxiety    Deviated septum    GERD (gastroesophageal reflux disease)    Hearing aid worn    bilateral   History of bilateral salpingectomy    Hyperlipidemia    Hypertension    Renal stones    Sleep apnea    Doesn't use everyday   Stroke (HCC)    Thyroid disease     PSH:  Past Surgical History:  Procedure Laterality Date   BILATERAL SALPINGECTOMY  07.2016   CYSTOSCOPY WITH RETROGRADE PYELOGRAM, URETEROSCOPY AND STENT PLACEMENT Right 07/29/2015   Procedure: CYSTOSCOPY WITH RIGHT RETROGRADE PYELOGRAM, RIGHT URETEROSCOPY AND STENT PLACEMENT;  Surgeon: Homero Luster, MD;  Location: [facility];  Service: Urology;  Laterality: Right;   ECTOPIC PREGNANCY SURGERY  10/2013   ENDOSCOPIC MUCOSAL RESECTION  02/13/2022   Procedure: ENDOSCOPIC MUCOSAL RESECTION;  Surgeon: Normie Becton., MD;  Location: [facility];  Service: Gastroenterology;;   EUS N/A 02/13/2022   Procedure: LOWER ENDOSCOPIC ULTRASOUND (EUS);  Surgeon: Normie Becton., MD;  Location: [facility];  Service: Gastroenterology;  Laterality: N/A;   EUS N/A 05/07/2023   Procedure: LOWER ENDOSCOPIC ULTRASOUND (EUS);  Surgeon: Normie Becton., MD;  Location: [facility];  Service: Gastroenterology;  Laterality: N/A;   FLEXIBLE SIGMOIDOSCOPY N/A 02/13/2022   Procedure: FLEXIBLE SIGMOIDOSCOPY;  Surgeon: Brice Campi Albino Alu., MD;  Location: [facility];  Service: Gastroenterology;  Laterality: N/A;   FLEXIBLE SIGMOIDOSCOPY N/A 05/07/2023   Procedure: FLEXIBLE SIGMOIDOSCOPY;  Surgeon: Brice Campi Albino Alu., MD;  Location: [facility];  Service: Gastroenterology;  Laterality: N/A;   HEMOSTASIS CLIP PLACEMENT  02/13/2022   Procedure: HEMOSTASIS CLIP PLACEMENT;  Surgeon: Normie Becton., MD;  Location: [facility];  Service: Gastroenterology;;   HOLMIUM LASER APPLICATION Right 07/29/2015   Procedure: HOLMIUM LASER APPLICATION;  Surgeon: Homero Luster, MD;  Location: [facility];  Service: Urology;  Laterality: Right;   NASAL SEPTUM SURGERY     doen along with tonsillectomy    POLYPECTOMY  05/07/2023   Procedure: POLYPECTOMY;  Surgeon: Mansouraty, Albino Alu., MD;  Location: [facility];  Service: Gastroenterology;;   [place] LIFTING INJECTION  02/13/2022   Procedure: SUBMUCOSAL LIFTING INJECTION;  Surgeon: Normie Becton., MD;  Location: [facility];  Service: Gastroenterology;;   TONSILLECTOMY     TYMPANOSTOMY     x 3    Social History:  Social History   Socioeconomic History   Marital status: Married    Spouse name: Not on file  Number of children: 3   Years of education: Not on file   Highest education level: Not on file  Occupational History   Not on file  Tobacco Use   Smoking status: Former    Current packs/day: 0.00    Average packs/day: 0.3 packs/day for 22.5 years (5.6 ttl pk-yrs)    Types: Cigarettes    Start date: 10/04/1997    Quit date: 03/26/2020    Years since quitting: 3.6   Smokeless tobacco: Never  Vaping Use   Vaping status: Some Days  Substance and Sexual Activity   Alcohol use: No   Drug use: Never   Sexual activity: Yes    Birth control/protection: None, Surgical  Other Topics Concern   Not on file  Social History Narrative   Not on file   Social Drivers of Health   Financial Resource Strain: Low Risk  (09/11/2023)   Received from [facility]   Overall Financial Resource Strain (CARDIA)    Difficulty of Paying Living Expenses: Not very hard  Food Insecurity: No Food Insecurity (09/11/2023)   Received from [facility]   Hunger Vital Sign    Worried About Running Out of Food in the Last Year: Never true    Ran Out of Food in the Last Year: Never true  Transportation Needs: No Transportation Needs (09/11/2023)    Received from [facility] - Transportation    Lack of Transportation (Medical): No    Lack of Transportation (Non-Medical): No  Physical Activity: Inactive (07/30/2023)   Received from [facility]   Exercise Vital Sign    Days of Exercise per Week: 0 days    Minutes of Exercise per Session: 0 min  Stress: Stress Concern Present (12/06/2022)   [facility] - Occupational Stress Questionnaire    Feeling of Stress : To some extent  Social Connections: Socially Isolated (12/06/2022)   Received from [facility]   Social Network    How would you rate your social network (family, work, friends)?: Little participation, lonely and socially isolated  Intimate Partner Violence: Not At Risk (12/06/2022)   Received from [facility]   HITS    Over the last 12 months how often did your partner physically hurt you?: Never    Over the last 12 months how often did your partner insult you or talk down to you?: Never    Over the last 12 months how often did your partner threaten you with physical harm?: Never    Over the last 12 months how often did your partner scream or curse at you?: Never    Family History:  Family History  Problem Relation Age of Onset   Hypertension Mother    Cancer Father        gastric cancer   Hypertension Father    Hyperlipidemia Father    Diabetes Brother    Diabetes Other    Colon cancer Neg Hx    Colon polyps Neg Hx    Esophageal cancer Neg Hx    Rectal cancer Neg Hx    Stomach cancer Neg Hx     Medications:   Current Outpatient Medications on File Prior to Visit  Medication Sig Dispense Refill   albuterol  (VENTOLIN  HFA) 108 (90 Base) MCG/ACT inhaler Inhale 2 puffs into the lungs every 4 (four) hours as needed for wheezing or shortness of breath. (Patient not taking: Reported  on 06/22/2023) 1 each 0   ALPRAZolam  (XANAX ) 1 MG tablet Take 0.5-1  tablets by mouth 3 (three) times daily as needed for anxiety.  1   amLODipine  (NORVASC ) 10 MG tablet Take 10 mg by mouth daily.     atorvastatin  (LIPITOR ) 80 MG tablet Take 80 mg by mouth daily.     clopidogrel  (PLAVIX ) 75 MG tablet Take 1 tablet (75 mg total) by mouth daily. 30 tablet 1   dicyclomine  (BENTYL ) 20 MG tablet Take 1 tablet (20 mg total) by mouth every 6 (six) hours. (Patient not taking: Reported on 06/06/2022) 10 tablet 0   ezetimibe  (ZETIA ) 10 MG tablet Take 10 mg by mouth daily.     famotidine  (PEPCID ) 40 MG tablet Take 1 tablet (40 mg total) by mouth at bedtime. 30 tablet 11   gabapentin (NEURONTIN) 300 MG capsule Take 300 mg by mouth daily as needed (nerve pain).     levothyroxine  (SYNTHROID ) 75 MCG tablet Take 75 mcg by mouth daily before breakfast.     lidocaine  4 % Place 1 patch onto the skin daily. 10 patch 0   losartan -hydrochlorothiazide  (HYZAAR) 100-12.5 MG tablet TAKE 1 TABLET BY MOUTH EVERY DAY 30 tablet 0   methocarbamol  (ROBAXIN ) 750 MG tablet Take 1 tablet (750 mg total) by mouth 4 (four) times daily. 20 tablet 0   ondansetron  (ZOFRAN -ODT) 8 MG disintegrating tablet Take 8 mg by mouth every 8 (eight) hours as needed for nausea/vomiting.     pantoprazole  (PROTONIX ) 40 MG tablet TAKE 1 TABLET BY MOUTH TWICE DAILY BEFORE BREAKFAST AND DINNER (Patient taking differently: Take 80 mg by mouth daily.) 180 tablet 0   predniSONE  (STERAPRED UNI-PAK 21 TAB) 10 MG (21) TBPK tablet 6 day taper; take as directed on package instructions 21 tablet 0   propranolol  ER (INDERAL  LA) 60 MG 24 hr capsule Take 60 mg by mouth daily.     sertraline  (ZOLOFT ) 100 MG tablet Take 100 mg by mouth daily.     No current facility-administered medications on file prior to visit.    Allergies:   Allergies  Allergen Reactions   Hydrocodeine [Dihydrocodeine] Shortness Of Breath and Nausea And Vomiting   Hydrocodone -Acetaminophen  Shortness Of Breath and Nausea Only   Coreg [Carvedilol]      Headache    Labetalol  Other (See Comments)    Head itches and GI intolerance      OBJECTIVE:  Physical Exam  There were no vitals filed for this visit.  There is no height or weight on file to calculate BMI. No results found.   General: well developed, well nourished, pleasant middle-age Caucasian female, seated, in no evident distress Head: head normocephalic and atraumatic.   Neck: supple with no carotid or supraclavicular bruits Cardiovascular: regular rate and rhythm, no murmurs Musculoskeletal: no deformity Skin:  no rash/petichiae Vascular:  Normal pulses all extremities   Neurologic Exam Mental Status: Awake and fully alert.  Fluent speech and language.  Oriented to place and time. Recent and remote memory intact. Attention span, concentration and fund of knowledge appropriate. Mood and affect appropriate.  Cranial Nerves: FPupils equal, briskly reactive to light. Extraocular movements full without nystagmus. Visual fields full to confrontation. Hearing intact. Facial sensation intact. Face, tongue, palate moves normally and symmetrically.  Motor: Normal bulk and tone. Normal strength in all tested extremity muscles Sensory.: intact to touch , pinprick , position and vibratory sensation.  Coordination: Rapid alternating movements normal in all extremities. Finger-to-nose and heel-to-shin performed accurately bilaterally. Gait  and Station: Arises from chair without difficulty. Stance is normal. Gait demonstrates normal stride length and balance without use of AD. Tandem walk and heel toe without difficulty.  Reflexes: 1+ and symmetric. Toes downgoing.      10/12/2022    1:27 PM  MMSE - Mini Mental State Exam  Orientation to time 5  Orientation to Place 5  Registration 3  Attention/ Calculation 4  Recall 1  Language- name 2 objects 2  Language- repeat 1  Language- follow 3 step command 3  Language- read & follow direction 1  Write a sentence 1  Copy design 1   Total score 27         ASSESSMENT: Maria Lang is a 43 y.o. year old female with mild cognitive impairment. Remote history of R pontine stroke 11/2018 and TIA s/p TNK 09/2021. Vascular risk factors of HTN, HLD, CHF, hx of OSA, obesity, iron deficiency anemia d/t chronic blood loss d/t heavy menstrual bleeding and hypothyroidism.     PLAN:  MCI: MMSE today ***/30 (prior 27/30 09/2022) Memory concerns likely multifactorial from history of prior stroke, iron deficiency anemia, medication side effect (Xanax , gabapentin, Robaxin , sertraline , propranolol ) and suboptimal control of anxiety and depression Dementia panel benign MR brain 09/2022 evidence of prior stroke, no acute abnormalities EEG 10/2022 benign  Hx of R Pontine stroke and TIA:  Continue clopidogrel  75 mg daily  and atorvastatin  40 mg daily for secondary stroke prevention manner/prescribed by PCP Hypercoagulable labs within normal limits Cardiac monitor negative for atrial fibrillation Discussed secondary stroke prevention measures and importance of close PCP follow up for aggressive stroke risk factor management including BP goal<130/90, and HLD with LDL goal<70  I have gone over the pathophysiology of stroke, warning signs and symptoms, risk factors and their management in some detail with instructions to go to the closest emergency room for symptoms of concern.    Doing well from stroke standpoint without further recommendations and risk factors are managed by PCP. She may follow up PRN, as usual for our patients who are strictly being followed for stroke. If any new neurological issues should arise, request PCP place referral for evaluation by one of our neurologists. Thank you.     CC:  PCP: [name], [name], PA-C    I spent 31 minutes of face-to-face and non-face-to-face time with patient.  This included previsit chart review including review of recent hospitalization, lab review, study review, electronic health  record documentation, patient education above diagnoses and treatment plan and answered all the questions to patient's satisfaction   Johny Nap, [facility]  [facility] [address] [place], [place] [address]  Phone [phone] Fax [phone] Note: This document was prepared with digital dictation and possible smart phrase technology. Any transcriptional errors that result from this process are unintentional.

## 2023-11-20 ENCOUNTER — Ambulatory Visit: Admitting: Adult Health

## 2023-11-21 ENCOUNTER — Telehealth (INDEPENDENT_AMBULATORY_CARE_PROVIDER_SITE_OTHER): Admitting: Adult Health

## 2023-11-21 ENCOUNTER — Encounter: Payer: Self-pay | Admitting: Adult Health

## 2023-11-21 DIAGNOSIS — G4730 Sleep apnea, unspecified: Secondary | ICD-10-CM

## 2023-11-21 DIAGNOSIS — G3184 Mild cognitive impairment, so stated: Secondary | ICD-10-CM

## 2023-11-21 DIAGNOSIS — I6350 Cerebral infarction due to unspecified occlusion or stenosis of unspecified cerebral artery: Secondary | ICD-10-CM

## 2023-11-21 DIAGNOSIS — G459 Transient cerebral ischemic attack, unspecified: Secondary | ICD-10-CM | POA: Diagnosis not present

## 2023-11-21 NOTE — Progress Notes (Signed)
 [facility] [address] [place].  [address] [phone]       STROKE FOLLOW UP NOTE  Maria Lang Date of Birth:  20-Jul-1980 Medical Record Number:  [id number]   Reason for Referral: stroke follow up  Virtual Visit via Video Note  I connected with Maria Lang on 11/21/23 at 12:45 PM EDT by a video enabled telemedicine application and verified that I am speaking with the correct person using two identifiers.  Location: Patient: at home Provider: at home   I discussed the limitations of evaluation and management by telemedicine and the availability of in person appointments. The patient expressed understanding and agreed to proceed.   SUBJECTIVE:   CHIEF COMPLAINT:  Memory complaints  HPI:   Update 11/21/2023 JM: patient returns for follow up visit re: her memory complaints, previously seen by Dr. Janett Medin 7 months ago.   Reports cognition overall stable since prior visit although continues to struggle with short-term memory and at times difficulty finding the correct word.  Continues to maintain ADLs and majority of IADL's independently. Some days will have a hard time remembering if she took her medications and husband will need to remind her. She does do matching games in her phone. No routine exercise. Did start gym membership last week but has not had a chance to start it yet. Sleep is "horrible", has difficulty falling asleep. Prior dx of OSA in 2020, never started CPAP therapy although this was recommended. Plans on scheduling f/u visit with sleep clinic to discuss repeat sleep study.  No new stroke/TIA symptoms.  She is typically compliant with clopidogrel  but currently on hold for hysterectomy scheduled tomorrow.  She also remains on atorvastatin  without side effects.  Routinely follows with PCP and cardiology.  Reports routine lab work by PCP.  No further questions or concerns at this time.     History provided for reference purposes  only Update 04/23/2023 Dr. Janett Medin : She returns for follow-up after last visit 6 months ago.  She continues to do well.  She is still have some short-term memory and cognitive difficulties but she feels these are unchanged and seem most noticeable when she is under stress.  She also has underlying anxiety which also makes situation worse.  She denies any headaches or focal neurological symptoms.  She is still independent in activities of daily living.  She had carpal tunnel surgery in the left hand on 04/02/2023 and in the right hand in August this year.  She underwent lab work at last visit on 10/12/2022 and vitamin B12, TSH, RPR and homocystine were all normal.  Apoprotein E blood test was positive for a single copy of a 3/84 which puts her at slightly high risk for Alzheimer's late in life.  MRI scan of the brain on 10/17/2022 shows remote age right pontine infarct with changes of small vessel disease.  No acute abnormalities.  She has no new complaints today.  She remains on Plavix  which she is tolerating well without bruising or bleeding and she states her blood pressure and cholesterol both have been under good control and she is tolerating Lipitor  well without muscle aches and pains.  She states she had lab work checked by primary physician a few months ago and everything was satisfactory.  I do not have those results to review today.   Update 10/12/2022 Dr. Janett Medin:  Ms. Munos is a 43 year old Hispanic lady seen today for initial office consultation visit for memory and speech difficulties.  History is  obtained from the patient and review of electronic medical records and I personally reviewed pertinent available imaging films in PACS.  She has past medical history of hypertension, hyperlipidemia, sleep apnea, GERD, right pontine stroke without residual deficits and strokelike episode in April 2023 with left-sided weakness treated with TNK. Patient states that she does notice mild memory difficulties and  speech difficulties which seem to have gotten worse for the last few months.  She has word finding difficulties and often gets stuck in sentences and has trouble getting words out.  She has some good days and bad days.  She does have a history of longstanding anxiety but she feels it is well-controlled on the current medication regimen of Zoloft  and Xanax  as needed.  She denies any increased stressors or increase in anxiety.  She is fully independent in activities of daily living except she is scared to use dose to during the day as she may forget to turn it off so she uses the microwave and only when her husband is at home she cooks on the stove.  She denies any significant headaches, gait or balance problems.  There is no history of significant head injury with loss of consciousness, seizures.  She does have a strong family history of dementia on both sides of the family.  Her dad died of Alzheimer's disease.  She worries about this diagnosis.  She has not had any recent lab work or brain imaging done.  Update 05/31/2022 JM: Patient returns for 38-month stroke follow-up.  Overall stable from stroke standpoint without new or reoccurring stroke/TIA symptoms.  Remains on Plavix  and atorvastatin  and Zetia .  Blood pressure well controlled at home, typically 130s/90s.  Routinely follows with PCP.  She continues to have left leg pain and more recently low back pain, is being seen by Ortho for SI joint issues (per patient) and currently working with PT, per patient if no improvement with PT then plans on pursuing imaging for further evaluation, has f/u with Ortho on 1/9 (unable to view via epic).  No further concerns at this time.  Initial visit 11/24/2021 JM: Patient is being seen for initial hospital follow-up.  She was previously seen in office 03/05/2019 for initial hospital stroke follow-up but did not follow-up after that time. She does have occasional left leg pain which she reports started shortly after discharge,  was started on gabapentin by PCP which she uses as needed, has been getting better. No new stroke/TIA symptoms.  Has returned back to all prior activities.  Completed 3 weeks DAPT, remains on Plavix  alone as well as atorvastatin , denies side effects.  Blood pressure today 146/102, monitors at home and has been running on higher side of normal more recently.  She believes this is more due to her anxiety regarding her current health.  Shortly before her TIA, she was found to have a rectal tumor and was scheduled for surgical removal on 5/25 but this was canceled in setting of recent TIA.  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  She is awaiting to undergo CT abdomen/pelvis to ensure no evidence of lymphadenopathy. She questions at what point can Plavix  be held to undergo removal.  She was able to complete about 1 to 2 weeks of cardiac monitor but had difficulty and monitor consistently said poor skin contact.  She has not yet sent back monitor. Remaining hypercoagulable labs pending at discharge were largely unremarkable.  No further concerns at this time.  Stroke admission 10/10/2021 Maria Lang  is a 43 year old female with history of hypertension, hyperlipidemia, former smoker, OSA, hx of R pontine stroke in 11/2018 without residual deficit who presented on 10/10/2021 with left-sided weakness, and decreased level of consciousness.  Personally reviewed hospitalization pertinent progress notes, lab work and imaging.  Evaluated by Dr. Christiane Cowing.  CT no acute abnormality.  Status post TNK.  CTA head and neck showed right P2 moderate stenosis.  MRI no acute infarct.  EF 60 to 65%, LDL 71, A1c 5.3.  DVT negative.  Hypercoag work-up pending at discharge.  Creatinine 1.20, ESR 30, UA WBC 21-50.  Evaluated by Dr. Christiane Cowing, exam neurologically intact without any deficit.  Symptoms possibly TIA vs aborted stroke s/p TNK although etiology not quite clear therefore recommended 30-day cardiac event monitor to rule  out A-fib.  Recommended DAPT for 3 weeks and Plavix  alone and continue atorvastatin  40 mg daily. Discharged home without therapy needs.     PERTINENT IMAGING  MR BRAIN WO CONTRAST 10/11/2021 IMPRESSION: 1. No acute intracranial abnormality. 2. Chronic pontine infarct.  CTA HEAD/NECK 10/10/2021 IMPRESSION: 1. No emergent large vessel occlusion. 2. Moderate focal stenosis of the right P2 segment, new since 2020. Otherwise, patent vasculature of the head and neck.  CT HEAD 10/10/2021 IMPRESSION: 1. No acute intracranial pathology. 2. ASPECTS is 10  2D ECHO 10/11/2021 IMPRESSIONS   1. Left ventricular ejection fraction, by estimation, is 60 to 65%. The  left ventricle has normal function. The left ventricle has no regional  wall motion abnormalities. There is moderate concentric left ventricular  hypertrophy. Left ventricular  diastolic parameters are consistent with Grade I diastolic dysfunction  (impaired relaxation).   2. Right ventricular systolic function is normal. The right ventricular  size is normal. Tricuspid regurgitation signal is inadequate for assessing  PA pressure.   3. The mitral valve is grossly normal. No evidence of mitral valve  regurgitation. No evidence of mitral stenosis.   4. The aortic valve is tricuspid. Aortic valve regurgitation is not  visualized. No aortic stenosis is present.   5. The inferior vena cava is normal in size with greater than 50%  respiratory variability, suggesting right atrial pressure of 3 mmHg.        ROS:   14 system review of systems performed and negative with exception of those listed in HPI  PMH:  Past Medical History:  Diagnosis Date   Anxiety    Deviated septum    GERD (gastroesophageal reflux disease)    Hearing aid worn    bilateral   History of bilateral salpingectomy    Hyperlipidemia    Hypertension    Renal stones    Sleep apnea    Doesn't use everyday   Stroke (HCC)    Thyroid disease     PSH:   Past Surgical History:  Procedure Laterality Date   BILATERAL SALPINGECTOMY  07.2016   CYSTOSCOPY WITH RETROGRADE PYELOGRAM, URETEROSCOPY AND STENT PLACEMENT Right 07/29/2015   Procedure: CYSTOSCOPY WITH RIGHT RETROGRADE PYELOGRAM, RIGHT URETEROSCOPY AND STENT PLACEMENT;  Surgeon: Homero Luster, MD;  Location: [facility];  Service: Urology;  Laterality: Right;   ECTOPIC PREGNANCY SURGERY  10/2013   ENDOSCOPIC MUCOSAL RESECTION  02/13/2022   Procedure: ENDOSCOPIC MUCOSAL RESECTION;  Surgeon: Normie Becton., MD;  Location: [facility];  Service: Gastroenterology;;   EUS N/A 02/13/2022   Procedure: LOWER ENDOSCOPIC ULTRASOUND (EUS);  Surgeon: Normie Becton., MD;  Location: [facility];  Service: Gastroenterology;  Laterality: N/A;   EUS N/A 05/07/2023   Procedure:  LOWER ENDOSCOPIC ULTRASOUND (EUS);  Surgeon: Normie Becton., MD;  Location: [facility];  Service: Gastroenterology;  Laterality: N/A;   FLEXIBLE SIGMOIDOSCOPY N/A 02/13/2022   Procedure: FLEXIBLE SIGMOIDOSCOPY;  Surgeon: Brice Campi Albino Alu., MD;  Location: [facility];  Service: Gastroenterology;  Laterality: N/A;   FLEXIBLE SIGMOIDOSCOPY N/A 05/07/2023   Procedure: FLEXIBLE SIGMOIDOSCOPY;  Surgeon: Brice Campi Albino Alu., MD;  Location: [facility];  Service: Gastroenterology;  Laterality: N/A;   HEMOSTASIS CLIP PLACEMENT  02/13/2022   Procedure: HEMOSTASIS CLIP PLACEMENT;  Surgeon: Normie Becton., MD;  Location: [facility];  Service: Gastroenterology;;   HOLMIUM LASER APPLICATION Right 07/29/2015   Procedure: HOLMIUM LASER APPLICATION;  Surgeon: Homero Luster, MD;  Location: [facility];  Service: Urology;  Laterality: Right;   NASAL SEPTUM SURGERY     doen along with tonsillectomy    POLYPECTOMY  05/07/2023   Procedure: POLYPECTOMY;  Surgeon: Mansouraty, Albino Alu., MD;  Location: [facility];  Service: Gastroenterology;;   [place] LIFTING INJECTION  02/13/2022   Procedure: SUBMUCOSAL LIFTING  INJECTION;  Surgeon: Normie Becton., MD;  Location: [facility];  Service: Gastroenterology;;   TONSILLECTOMY     TYMPANOSTOMY     x 3    Social History:  Social History   Socioeconomic History   Marital status: Married    Spouse name: Not on file   Number of children: 3   Years of education: Not on file   Highest education level: Not on file  Occupational History   Not on file  Tobacco Use   Smoking status: Former    Current packs/day: 0.00    Average packs/day: 0.3 packs/day for 22.5 years (5.6 ttl pk-yrs)    Types: Cigarettes    Start date: 10/04/1997    Quit date: 03/26/2020    Years since quitting: 3.6   Smokeless tobacco: Never  Vaping Use   Vaping status: Some Days  Substance and Sexual Activity   Alcohol use: No   Drug use: Never   Sexual activity: Yes    Birth control/protection: None, Surgical  Other Topics Concern   Not on file  Social History Narrative   Not on file   Social Drivers of Health   Financial Resource Strain: Low Risk  (09/11/2023)   Received from [facility]   Overall Financial Resource Strain (CARDIA)    Difficulty of Paying Living Expenses: Not very hard  Food Insecurity: No Food Insecurity (09/11/2023)   Received from [facility]   Hunger Vital Sign    Worried About Running Out of Food in the Last Year: Never true    Ran Out of Food in the Last Year: Never true  Transportation Needs: No Transportation Needs (09/11/2023)   Received from [facility] - Transportation    Lack of Transportation (Medical): No    Lack of Transportation (Non-Medical): No  Physical Activity: Inactive (07/30/2023)   Received from [facility]   Exercise Vital Sign    Days of Exercise per Week: 0 days    Minutes of Exercise per Session: 0 min  Stress: Stress Concern Present (12/06/2022)   Received from [facility] - Occupational Stress Questionnaire    Feeling of Stress : To  some extent  Social Connections: Socially Isolated (12/06/2022)   Received from [facility]   Social Network    How would you rate your social network (family, work, friends)?: Little participation, lonely and socially isolated  Intimate Partner Violence: Not At Risk (12/06/2022)   Received from [facility]   HITS    Over the last 12 months how often did your partner physically hurt you?: Never    Over the last 12 months how often did your partner insult you or talk down to you?: Never    Over the last 12 months how often did your partner threaten you with physical harm?: Never    Over the last 12 months how often did your partner scream or curse at you?: Never    Family History:  Family History  Problem Relation Age of Onset   Hypertension Mother    Cancer Father        gastric cancer   Hypertension Father    Hyperlipidemia Father    Diabetes Brother    Diabetes Other    Colon cancer Neg Hx    Colon polyps Neg Hx    Esophageal cancer Neg Hx    Rectal cancer Neg Hx    Stomach cancer Neg Hx     Medications:   Current Outpatient Medications on File Prior to Visit  Medication Sig Dispense Refill   albuterol  (VENTOLIN  HFA) 108 (90 Base) MCG/ACT inhaler Inhale 2 puffs into the lungs every 4 (four) hours as needed for wheezing or shortness of breath. (Patient not taking: Reported on 06/22/2023) 1 each 0   ALPRAZolam  (XANAX ) 1 MG tablet Take 0.5-1 tablets by mouth 3 (three) times daily as needed for anxiety.  1   amLODipine  (NORVASC ) 10 MG tablet Take 10 mg by mouth daily.     atorvastatin  (LIPITOR ) 80 MG tablet Take 80 mg by mouth daily.     clopidogrel  (PLAVIX ) 75 MG tablet Take 1 tablet (75 mg total) by mouth daily. 30 tablet 1   dicyclomine  (BENTYL ) 20 MG tablet Take 1 tablet (20 mg total) by mouth every 6 (six) hours. (Patient not taking: Reported on 06/06/2022) 10 tablet 0   ezetimibe  (ZETIA ) 10 MG tablet Take 10 mg by mouth daily.      famotidine  (PEPCID ) 40 MG tablet Take 1 tablet (40 mg total) by mouth at bedtime. 30 tablet 11   gabapentin (NEURONTIN) 300 MG capsule Take 300 mg by mouth daily as needed (nerve pain).     levothyroxine  (SYNTHROID ) 75 MCG tablet Take 75 mcg by mouth daily before breakfast.     lidocaine  4 % Place 1 patch onto the skin daily. 10 patch 0   losartan -hydrochlorothiazide  (HYZAAR) 100-12.5 MG tablet TAKE 1 TABLET BY MOUTH EVERY DAY 30 tablet 0   methocarbamol  (ROBAXIN ) 750 MG tablet Take 1 tablet (750 mg total) by mouth 4 (four) times daily. 20 tablet 0   ondansetron  (ZOFRAN -ODT) 8 MG disintegrating tablet Take 8 mg by mouth every 8 (eight) hours as needed for nausea/vomiting.     pantoprazole  (PROTONIX ) 40 MG tablet TAKE 1 TABLET BY MOUTH TWICE DAILY BEFORE BREAKFAST AND DINNER (Patient taking differently: Take 80 mg by mouth daily.) 180 tablet 0   predniSONE  (STERAPRED UNI-PAK 21 TAB) 10 MG (21) TBPK tablet 6 day taper; take as directed on package instructions 21 tablet 0   propranolol  ER (INDERAL  LA) 60 MG 24 hr capsule Take 60 mg by mouth daily.     sertraline  (ZOLOFT ) 100 MG tablet Take 100 mg by mouth daily.     No current facility-administered medications on file prior to visit.    Allergies:   Allergies  Allergen Reactions   Hydrocodeine [Dihydrocodeine] Shortness Of  Breath and Nausea And Vomiting   Hydrocodone -Acetaminophen  Shortness Of Breath and Nausea Only   Coreg [Carvedilol]     Headache    Labetalol  Other (See Comments)    Head itches and GI intolerance      OBJECTIVE:  Physical Exam  General: well developed, well nourished, pleasant middle-age Caucasian female, seated, in no evident distress  Neurologic Exam Mental Status: Awake and fully alert.  Fluent speech and language.  Occasional speech hesitancy but no evidence of aphasia.  Oriented to place and time. Recent memory subjectively impaired and remote memory intact. Attention span, concentration and fund of knowledge  appropriate during visit. Mood and affect appropriate.      10/12/2022    1:27 PM  MMSE - Mini Mental State Exam  Orientation to time 5  Orientation to Place 5  Registration 3  Attention/ Calculation 4  Recall 1  Language- name 2 objects 2  Language- repeat 1  Language- follow 3 step command 3  Language- read & follow direction 1  Write a sentence 1  Copy design 1  Total score 27         ASSESSMENT: Lurlene Ronda is a 43 y.o. year old female with mild cognitive impairment. Remote history of R pontine stroke 11/2018 and TIA s/p TNK 09/2021. Vascular risk factors of HTN, HLD, CHF, hx of OSA, obesity, iron deficiency anemia d/t chronic blood loss d/t heavy menstrual bleeding and hypothyroidism.     PLAN:  MCI: MMSE 27/30 09/2022 -unable to repeat today due to visit type Memory concerns likely multifactorial from history of prior stroke, untreated OSA, iron deficiency anemia, medication side effect (Xanax , gabapentin, Robaxin , sertraline , propranolol ) and possibly suboptimal control of anxiety and depression Discussed importance of routine cognitive exercises as well as routine physical activity and exercise as well as good sleep, healthy diet and management of vascular risk factors She was encouraged to contact office to schedule follow-up visit with Dr. Omar Bibber to discuss repeat sleep study.  Advised untreated sleep apnea possibly contributing to memory concerns Dementia panel benign MR brain 09/2022 evidence of prior stroke, no acute abnormalities EEG 10/2022 benign  Hx of R Pontine stroke and TIA:  Continue clopidogrel  75 mg daily  and atorvastatin  40 mg daily for secondary stroke prevention manner/prescribed by PCP Hypercoagulable labs within normal limits Cardiac monitor negative for atrial fibrillation Discussed secondary stroke prevention measures and importance of close PCP follow up for aggressive stroke risk factor management including BP goal<130/90, and HLD with LDL  goal<70  I have gone over the pathophysiology of stroke, warning signs and symptoms, risk factors and their management in some detail with instructions to go to the closest emergency room for symptoms of concern.     No further recommendations from stroke or neurological standpoint at this time.  She was advised to follow-up on an as-needed basis but advised to call with any questions or concerns in the future     CC:  PCP: [name], [name], PA-C    I spent 25 minutes of face-to-face and non-face-to-face time with patient via MyChart video visit.  This included previsit chart review, lab review, study review, electronic health record documentation, patient education above diagnoses and treatment plan and answered all the questions to patient's satisfaction   Johny Nap, [facility]  [facility] [address] [place], [place] [address]  Phone [phone] Fax [phone] Note: This document was prepared with digital dictation and possible smart phrase technology. Any transcriptional errors that result  from this process are unintentional.

## 2023-11-22 ENCOUNTER — Ambulatory Visit: Payer: BC Managed Care – PPO | Admitting: Neurology

## 2023-11-27 ENCOUNTER — Telehealth: Payer: Self-pay | Admitting: Nurse Practitioner

## 2023-11-28 ENCOUNTER — Other Ambulatory Visit

## 2023-11-28 ENCOUNTER — Ambulatory Visit: Admitting: Nurse Practitioner

## 2023-12-13 ENCOUNTER — Other Ambulatory Visit: Payer: Self-pay | Admitting: Nurse Practitioner

## 2023-12-13 DIAGNOSIS — D500 Iron deficiency anemia secondary to blood loss (chronic): Secondary | ICD-10-CM

## 2023-12-13 NOTE — Progress Notes (Unsigned)
 Patient Care Team: [name], [name], PA-C as PCP - General (Internal Medicine) [name], MD as PCP - Cardiology (Cardiology)  Clinic Day:  12/14/2023  Referring physician: [name], [name], PA-C  ASSESSMENT & PLAN:   Assessment & Plan: Iron deficiency anemia due to chronic blood loss  secondary to menorrhagia Chronic iron deficiency anemia likely secondary to heavy menstrual bleeding. Hemoglobin levels have been in the 9 range since April 2023, indicating worsening anemia despite daily oral iron supplementation for over a year. Symptoms include fatigue and pica. No evidence of gastrointestinal bleeding from recent colonoscopy and endoscopy. Lab results show low iron levels, low ferritin, and high total iron binding capacity, consistent with iron deficiency anemia.  -IV iron is recommended due to insufficient response to oral iron. Most patients tolerate IV iron well, but there is a risk of allergic reactions, ranging from mild skin reactions to severe anaphylaxis requiring emergency care. The choice of IV iron product will depend on insurance coverage and availability. - Schedule IV iron infusions at a facility on [facility] [facility] due to availability constraints at the current location. - Monitor for allergic reactions during IV iron infusions, especially during the first few sessions. - Last iron infusion was March 2025. - She had a hysterectomy on 11/22/2023. -CBC today, 12/14/2023, is within normal limits as is iron panel.  Awaiting results of ferritin and B12. -Plan to recheck labs in 3 months and follow-up with her as needed.   Menorrhagia Patient underwent  hysterectomy on 11/22/2023.  She has been recovering well since surgery.  No postoperative problems.  She will follow-up with her surgeon in late July 2025.  Plan Labs reviewed. - CBC is normal. -Iron panel is normal. - B12 and ferritin are pending. It is likely that her iron deficiency anemia was related to  menorrhagia which would no longer be a problem since she had a hysterectomy on 11/22/2023. Plan to recheck labs in 3 months. Follow-up with her on as needed basis. The patient understands the plans discussed today and is in agreement with them.  She knows to contact our office if she develops concerns prior to her next appointment.  I provided 20 minutes of face-to-face time during this encounter and > 50% was spent counseling as documented under my assessment and plan.    Maria Deis, NP  [facility] - A DE[facility] [address] [place] [address] Dept: [phone] Dept Fax: [phone]   Orders Placed This Encounter  Procedures   CBC with Differential (Cancer Center Only)    Standing Status:   Future    Expected Date:   01/13/2024    Expiration Date:   12/13/2024   Iron and Iron Binding Capacity ([facility] only)    Standing Status:   Future    Expected Date:   03/15/2024    Expiration Date:   12/13/2024   Ferritin    Standing Status:   Future    Expected Date:   03/15/2024    Expiration Date:   12/13/2024      CHIEF COMPLAINT:  CC: Iron deficiency anemia due to chronic blood loss  Current Treatment: IV iron  INTERVAL HISTORY:  Maria Lang is here today for repeat clinical assessment. She was last seen by Dr. Maryalice Smaller. Her last IV iron treatment was 08/2023. She had hysterectomy on 11/22/2023.  She is recovering well.  She has no concerns or complaints.  She denies chest pain, chest pressure, or shortness of breath. She denies headaches or visual disturbances. She denies abdominal pain, nausea, vomiting, or changes in bowel or bladder habits.  She denies fevers or chills. She denies pain. Her appetite is good. Her weight has increased 4 pounds over last 3 months.  I have reviewed the past medical history, past surgical history, social history and family history with the patient and they are unchanged from  previous note.  ALLERGIES:  is allergic to hydrocodeine [dihydrocodeine], hydrocodone -acetaminophen , coreg [carvedilol], and labetalol .  MEDICATIONS:  Current Outpatient Medications  Medication Sig Dispense Refill   albuterol  (VENTOLIN  HFA) 108 (90 Base) MCG/ACT inhaler Inhale 2 puffs into the lungs every 4 (four) hours as needed for wheezing or shortness of breath. 1 each 0   ALPRAZolam  (XANAX ) 1 MG tablet Take 0.5-1 tablets by mouth 3 (three) times daily as needed for anxiety.  1   amLODipine  (NORVASC ) 10 MG tablet Take 10 mg by mouth daily.     atorvastatin  (LIPITOR ) 80 MG tablet Take 80 mg by mouth daily.     clopidogrel  (PLAVIX ) 75 MG tablet Take 1 tablet (75 mg total) by mouth daily. 30 tablet 1   dicyclomine  (BENTYL ) 20 MG tablet Take 1 tablet (20 mg total) by mouth every 6 (six) hours. 10 tablet 0   ezetimibe  (ZETIA ) 10 MG tablet Take 10 mg by mouth daily.     famotidine  (PEPCID ) 40 MG tablet Take 1 tablet (40 mg total) by mouth at bedtime. 30 tablet 11   gabapentin (NEURONTIN) 300 MG capsule Take 300 mg by mouth daily as needed (nerve pain).     levothyroxine  (SYNTHROID ) 75 MCG tablet Take 75 mcg by mouth daily before breakfast.     lidocaine  4 % Place 1 patch onto the skin daily. 10 patch 0   losartan -hydrochlorothiazide  (HYZAAR) 100-12.5 MG tablet TAKE 1 TABLET BY MOUTH EVERY DAY 30 tablet 0   methocarbamol  (ROBAXIN ) 750 MG tablet Take 1 tablet (750 mg total) by mouth 4 (four) times daily. 20 tablet 0   ondansetron  (ZOFRAN -ODT) 8 MG disintegrating tablet Take 8 mg by mouth every 8 (eight) hours as needed for nausea/vomiting.     pantoprazole  (PROTONIX ) 40 MG tablet TAKE 1 TABLET BY MOUTH TWICE DAILY BEFORE BREAKFAST AND DINNER (Patient taking differently: Take 80 mg by mouth daily.) 180 tablet 0   predniSONE  (STERAPRED UNI-PAK 21 TAB) 10 MG (21) TBPK tablet 6 day taper; take as directed on package instructions 21 tablet 0   propranolol  ER (INDERAL  LA) 60 MG 24 hr capsule Take 60 mg  by mouth daily.     sertraline  (ZOLOFT ) 100 MG tablet Take 100 mg by mouth daily.     No current facility-administered medications for this visit.     REVIEW OF SYSTEMS:   Constitutional: Denies fevers, chills or abnormal weight loss Eyes: Denies blurriness of vision Ears, nose, mouth, throat, and face: Denies mucositis or sore throat Respiratory: Denies cough, dyspnea or wheezes Cardiovascular: Denies palpitation, chest discomfort or lower extremity swelling Gastrointestinal:  Denies nausea, heartburn or change in bowel habits Skin: Denies abnormal skin rashes Lymphatics: Denies new lymphadenopathy or easy bruising Neurological:Denies numbness, tingling or new weaknesses Behavioral/Psych: Mood is stable, no new changes  All other systems were reviewed with the patient and are negative.   VITALS:   Today's Vitals   12/14/23 1303 12/14/23 1307  BP: (!) 160/110 (!) 160/110  Pulse: 83   Temp: 98.2 F (36.8 C)   TempSrc:  Temporal   SpO2: 98%   Weight: 203 lb 8 oz (92.3 kg)   Height: 5' 7.5 (1.715 m)   PainSc: 0-No pain    Body mass index is 31.4 kg/m.   Wt Readings from Last 3 Encounters:  12/14/23 203 lb 8 oz (92.3 kg)  09/20/23 199 lb 9.6 oz (90.5 kg)  09/13/23 197 lb 9.6 oz (89.6 kg)    Body mass index is 31.4 kg/m.  Performance status (ECOG): 1 - Symptomatic but completely ambulatory  PHYSICAL EXAM:   GENERAL:alert, no distress and comfortable SKIN: skin color, texture, turgor are normal, no rashes or significant lesions EYES: normal, Conjunctiva are pink and non-injected, sclera clear OROPHARYNX:no exudate, no erythema and lips, buccal mucosa, and tongue normal  NECK: supple, thyroid normal size, non-tender, without nodularity LYMPH:  no palpable lymphadenopathy in the cervical, axillary or inguinal LUNGS: clear to auscultation and percussion with normal breathing effort HEART: regular rate & rhythm and no murmurs and no lower extremity  edema ABDOMEN:abdomen soft with normal bowel sounds.  She has well-healed laparoscopic surgical incisions.  Very mild tenderness with palpation of the abdomen. Musculoskeletal:no cyanosis of digits and no clubbing  NEURO: alert & oriented x 3 with fluent speech, no focal motor/sensory deficits  LABORATORY DATA:  I have reviewed the data as listed    Component Value Date/Time   NA 139 10/04/2022 0051   K 3.7 10/04/2022 0051   CL 104 10/04/2022 0051   CO2 25 10/04/2022 0051   GLUCOSE 137 (H) 10/04/2022 0051   BUN 12 10/04/2022 0051   CREATININE 0.94 10/04/2022 0051   CALCIUM  9.1 10/04/2022 0051   PROT 7.7 10/04/2022 0051   ALBUMIN 4.2 10/04/2022 0051   AST 17 10/04/2022 0051   ALT 26 10/04/2022 0051   ALKPHOS 84 10/04/2022 0051   BILITOT 0.6 10/04/2022 0051   GFRNONAA >60 10/04/2022 0051   GFRAA >60 12/23/2018 0752   Lab Results  Component Value Date   WBC 6.9 12/14/2023   NEUTROABS 3.8 12/14/2023   HGB 13.0 12/14/2023   HCT 38.5 12/14/2023   MCV 90.0 12/14/2023   PLT 243 12/14/2023

## 2023-12-13 NOTE — Assessment & Plan Note (Addendum)
 secondary to menorrhagia Chronic iron deficiency anemia likely secondary to heavy menstrual bleeding. Hemoglobin levels have been in the 9 range since April 2023, indicating worsening anemia despite daily oral iron supplementation for over a year. Symptoms include fatigue and pica. No evidence of gastrointestinal bleeding from recent colonoscopy and endoscopy. Lab results show low iron levels, low ferritin, and high total iron binding capacity, consistent with iron deficiency anemia.  -IV iron is recommended due to insufficient response to oral iron. Most patients tolerate IV iron well, but there is a risk of allergic reactions, ranging from mild skin reactions to severe anaphylaxis requiring emergency care. The choice of IV iron product will depend on insurance coverage and availability. - Schedule IV iron infusions at a facility on [facility] due to availability constraints at the current location. - Monitor for allergic reactions during IV iron infusions, especially during the first few sessions. - Reassess hemoglobin and iron levels in one month post-infusion. - If anemia resolves post-IV iron, no further labs needed. If not, consider additional causes of anemia.

## 2023-12-14 ENCOUNTER — Inpatient Hospital Stay: Attending: Hematology

## 2023-12-14 ENCOUNTER — Encounter: Payer: Self-pay | Admitting: Nurse Practitioner

## 2023-12-14 ENCOUNTER — Inpatient Hospital Stay: Admitting: Nurse Practitioner

## 2023-12-14 VITALS — BP 160/110 | HR 83 | Temp 98.2°F | Ht 67.5 in | Wt 203.5 lb

## 2023-12-14 DIAGNOSIS — D500 Iron deficiency anemia secondary to blood loss (chronic): Secondary | ICD-10-CM | POA: Diagnosis not present

## 2023-12-14 DIAGNOSIS — N920 Excessive and frequent menstruation with regular cycle: Secondary | ICD-10-CM | POA: Diagnosis present

## 2023-12-14 DIAGNOSIS — Z79899 Other long term (current) drug therapy: Secondary | ICD-10-CM | POA: Insufficient documentation

## 2023-12-14 LAB — CBC WITH DIFFERENTIAL (CANCER CENTER ONLY)
Abs Immature Granulocytes: 0.03 10*3/uL (ref 0.00–0.07)
Basophils Absolute: 0 10*3/uL (ref 0.0–0.1)
Basophils Relative: 0 %
Eosinophils Absolute: 0 10*3/uL (ref 0.0–0.5)
Eosinophils Relative: 0 %
HCT: 38.5 % (ref 36.0–46.0)
Hemoglobin: 13 g/dL (ref 12.0–15.0)
Immature Granulocytes: 0 %
Lymphocytes Relative: 38 %
Lymphs Abs: 2.6 10*3/uL (ref 0.7–4.0)
MCH: 30.4 pg (ref 26.0–34.0)
MCHC: 33.8 g/dL (ref 30.0–36.0)
MCV: 90 fL (ref 80.0–100.0)
Monocytes Absolute: 0.4 10*3/uL (ref 0.1–1.0)
Monocytes Relative: 6 %
Neutro Abs: 3.8 10*3/uL (ref 1.7–7.7)
Neutrophils Relative %: 56 %
Platelet Count: 243 10*3/uL (ref 150–400)
RBC: 4.28 MIL/uL (ref 3.87–5.11)
RDW: 15.2 % (ref 11.5–15.5)
WBC Count: 6.9 10*3/uL (ref 4.0–10.5)
nRBC: 0 % (ref 0.0–0.2)

## 2023-12-14 LAB — VITAMIN B12: Vitamin B-12: 223 pg/mL (ref 180–914)

## 2023-12-14 LAB — IRON AND IRON BINDING CAPACITY (CC-WL,HP ONLY)
Iron: 85 ug/dL (ref 28–170)
Saturation Ratios: 24 % (ref 10.4–31.8)
TIBC: 354 ug/dL (ref 250–450)
UIBC: 269 ug/dL (ref 148–442)

## 2023-12-14 LAB — FERRITIN: Ferritin: 107 ng/mL (ref 11–307)

## 2023-12-21 ENCOUNTER — Ambulatory Visit: Payer: Self-pay | Admitting: Nurse Practitioner

## 2024-03-14 ENCOUNTER — Ambulatory Visit: Payer: Self-pay | Admitting: Nurse Practitioner

## 2024-03-14 ENCOUNTER — Inpatient Hospital Stay: Attending: Nurse Practitioner

## 2024-03-14 DIAGNOSIS — D500 Iron deficiency anemia secondary to blood loss (chronic): Secondary | ICD-10-CM | POA: Insufficient documentation

## 2024-03-14 LAB — CBC WITH DIFFERENTIAL (CANCER CENTER ONLY)
Abs Immature Granulocytes: 0.02 K/uL (ref 0.00–0.07)
Basophils Absolute: 0 K/uL (ref 0.0–0.1)
Basophils Relative: 1 %
Eosinophils Absolute: 0.1 K/uL (ref 0.0–0.5)
Eosinophils Relative: 2 %
HCT: 39.2 % (ref 36.0–46.0)
Hemoglobin: 13.1 g/dL (ref 12.0–15.0)
Immature Granulocytes: 0 %
Lymphocytes Relative: 30 %
Lymphs Abs: 2 K/uL (ref 0.7–4.0)
MCH: 29.6 pg (ref 26.0–34.0)
MCHC: 33.4 g/dL (ref 30.0–36.0)
MCV: 88.5 fL (ref 80.0–100.0)
Monocytes Absolute: 0.3 K/uL (ref 0.1–1.0)
Monocytes Relative: 4 %
Neutro Abs: 4.2 K/uL (ref 1.7–7.7)
Neutrophils Relative %: 63 %
Platelet Count: 245 K/uL (ref 150–400)
RBC: 4.43 MIL/uL (ref 3.87–5.11)
RDW: 13.8 % (ref 11.5–15.5)
WBC Count: 6.6 K/uL (ref 4.0–10.5)
nRBC: 0 % (ref 0.0–0.2)

## 2024-03-14 LAB — IRON AND IRON BINDING CAPACITY (CC-WL,HP ONLY)
Iron: 70 ug/dL (ref 28–170)
Saturation Ratios: 19 % (ref 10.4–31.8)
TIBC: 368 ug/dL (ref 250–450)
UIBC: 298 ug/dL (ref 148–442)

## 2024-03-14 LAB — FERRITIN: Ferritin: 124 ng/mL (ref 11–307)

## 2024-03-14 LAB — VITAMIN B12: Vitamin B-12: 439 pg/mL (ref 180–914)

## 2024-05-17 ENCOUNTER — Telehealth: Admitting: Physician Assistant

## 2024-05-17 DIAGNOSIS — R399 Unspecified symptoms and signs involving the genitourinary system: Secondary | ICD-10-CM

## 2024-05-17 MED ORDER — CEPHALEXIN 500 MG PO CAPS: 500.0000 mg | ORAL_CAPSULE | Freq: Two times a day (BID) | ORAL | 0 refills | Status: AC

## 2024-05-17 NOTE — Progress Notes (Signed)
